# Patient Record
Sex: Male | Born: 1935 | Race: White | Hispanic: No | Smoking: Former smoker
Health system: Southern US, Community
[De-identification: ages and names within clinical notes are randomized; demographics above are authoritative.]

## PROBLEM LIST (undated history)

## (undated) DIAGNOSIS — I10 Essential (primary) hypertension: Secondary | ICD-10-CM

## (undated) DIAGNOSIS — M810 Age-related osteoporosis without current pathological fracture: Secondary | ICD-10-CM

## (undated) DIAGNOSIS — I2510 Atherosclerotic heart disease of native coronary artery without angina pectoris: Secondary | ICD-10-CM

## (undated) DIAGNOSIS — J449 Chronic obstructive pulmonary disease, unspecified: Secondary | ICD-10-CM

## (undated) DIAGNOSIS — M1990 Unspecified osteoarthritis, unspecified site: Secondary | ICD-10-CM

## (undated) DIAGNOSIS — C4492 Squamous cell carcinoma of skin, unspecified: Secondary | ICD-10-CM

## (undated) DIAGNOSIS — E785 Hyperlipidemia, unspecified: Secondary | ICD-10-CM

## (undated) DIAGNOSIS — N400 Enlarged prostate without lower urinary tract symptoms: Secondary | ICD-10-CM

## (undated) DIAGNOSIS — I639 Cerebral infarction, unspecified: Secondary | ICD-10-CM

## (undated) DIAGNOSIS — J189 Pneumonia, unspecified organism: Secondary | ICD-10-CM

## (undated) HISTORY — DX: Squamous cell carcinoma of skin, unspecified: C44.92

## (undated) HISTORY — DX: Cerebral infarction, unspecified: I63.9

## (undated) HISTORY — PX: BACK SURGERY: SHX140

## (undated) HISTORY — DX: Unspecified osteoarthritis, unspecified site: M19.90

## (undated) HISTORY — DX: Pneumonia, unspecified organism: J18.9

---

## 2011-03-01 DIAGNOSIS — Z87898 Personal history of other specified conditions: Secondary | ICD-10-CM | POA: Diagnosis not present

## 2011-04-01 DIAGNOSIS — Z87898 Personal history of other specified conditions: Secondary | ICD-10-CM | POA: Diagnosis not present

## 2011-04-04 DIAGNOSIS — E236 Other disorders of pituitary gland: Secondary | ICD-10-CM | POA: Diagnosis not present

## 2011-04-04 DIAGNOSIS — I10 Essential (primary) hypertension: Secondary | ICD-10-CM | POA: Diagnosis not present

## 2011-04-04 DIAGNOSIS — E291 Testicular hypofunction: Secondary | ICD-10-CM | POA: Diagnosis not present

## 2011-04-04 DIAGNOSIS — R0989 Other specified symptoms and signs involving the circulatory and respiratory systems: Secondary | ICD-10-CM | POA: Diagnosis not present

## 2011-04-29 DIAGNOSIS — Z87898 Personal history of other specified conditions: Secondary | ICD-10-CM | POA: Diagnosis not present

## 2011-05-05 DIAGNOSIS — I10 Essential (primary) hypertension: Secondary | ICD-10-CM | POA: Diagnosis not present

## 2011-05-09 DIAGNOSIS — M546 Pain in thoracic spine: Secondary | ICD-10-CM | POA: Diagnosis not present

## 2011-05-09 DIAGNOSIS — M545 Low back pain: Secondary | ICD-10-CM | POA: Diagnosis not present

## 2011-05-09 DIAGNOSIS — M4120 Other idiopathic scoliosis, site unspecified: Secondary | ICD-10-CM | POA: Diagnosis not present

## 2011-05-11 DIAGNOSIS — E860 Dehydration: Secondary | ICD-10-CM | POA: Diagnosis not present

## 2011-05-11 DIAGNOSIS — J9801 Acute bronchospasm: Secondary | ICD-10-CM | POA: Diagnosis not present

## 2011-05-11 DIAGNOSIS — Z9981 Dependence on supplemental oxygen: Secondary | ICD-10-CM | POA: Diagnosis not present

## 2011-05-11 DIAGNOSIS — R05 Cough: Secondary | ICD-10-CM | POA: Diagnosis not present

## 2011-05-11 DIAGNOSIS — R059 Cough, unspecified: Secondary | ICD-10-CM | POA: Diagnosis not present

## 2011-05-11 DIAGNOSIS — R079 Chest pain, unspecified: Secondary | ICD-10-CM | POA: Diagnosis not present

## 2011-05-11 DIAGNOSIS — Z7982 Long term (current) use of aspirin: Secondary | ICD-10-CM | POA: Diagnosis not present

## 2011-05-11 DIAGNOSIS — I10 Essential (primary) hypertension: Secondary | ICD-10-CM | POA: Diagnosis not present

## 2011-05-11 DIAGNOSIS — I2789 Other specified pulmonary heart diseases: Secondary | ICD-10-CM | POA: Diagnosis present

## 2011-05-11 DIAGNOSIS — E538 Deficiency of other specified B group vitamins: Secondary | ICD-10-CM | POA: Diagnosis not present

## 2011-05-11 DIAGNOSIS — Z87311 Personal history of (healed) other pathological fracture: Secondary | ICD-10-CM | POA: Diagnosis not present

## 2011-05-11 DIAGNOSIS — Q211 Atrial septal defect: Secondary | ICD-10-CM | POA: Diagnosis not present

## 2011-05-11 DIAGNOSIS — I519 Heart disease, unspecified: Secondary | ICD-10-CM | POA: Diagnosis present

## 2011-05-11 DIAGNOSIS — N320 Bladder-neck obstruction: Secondary | ICD-10-CM | POA: Diagnosis not present

## 2011-05-11 DIAGNOSIS — E46 Unspecified protein-calorie malnutrition: Secondary | ICD-10-CM | POA: Diagnosis not present

## 2011-05-11 DIAGNOSIS — E291 Testicular hypofunction: Secondary | ICD-10-CM | POA: Diagnosis present

## 2011-05-11 DIAGNOSIS — Z87442 Personal history of urinary calculi: Secondary | ICD-10-CM | POA: Diagnosis not present

## 2011-05-11 DIAGNOSIS — E871 Hypo-osmolality and hyponatremia: Secondary | ICD-10-CM | POA: Diagnosis not present

## 2011-05-11 DIAGNOSIS — M810 Age-related osteoporosis without current pathological fracture: Secondary | ICD-10-CM | POA: Diagnosis present

## 2011-05-11 DIAGNOSIS — Z87891 Personal history of nicotine dependence: Secondary | ICD-10-CM | POA: Diagnosis not present

## 2011-05-11 DIAGNOSIS — M4000 Postural kyphosis, site unspecified: Secondary | ICD-10-CM | POA: Diagnosis present

## 2011-05-11 DIAGNOSIS — R000 Tachycardia, unspecified: Secondary | ICD-10-CM | POA: Diagnosis present

## 2011-05-11 DIAGNOSIS — Z681 Body mass index (BMI) 19 or less, adult: Secondary | ICD-10-CM | POA: Diagnosis not present

## 2011-05-11 DIAGNOSIS — J449 Chronic obstructive pulmonary disease, unspecified: Secondary | ICD-10-CM | POA: Diagnosis not present

## 2011-05-11 DIAGNOSIS — E559 Vitamin D deficiency, unspecified: Secondary | ICD-10-CM | POA: Diagnosis not present

## 2011-05-11 DIAGNOSIS — J441 Chronic obstructive pulmonary disease with (acute) exacerbation: Secondary | ICD-10-CM | POA: Diagnosis not present

## 2011-05-11 DIAGNOSIS — J189 Pneumonia, unspecified organism: Secondary | ICD-10-CM | POA: Diagnosis not present

## 2011-05-11 DIAGNOSIS — Z8673 Personal history of transient ischemic attack (TIA), and cerebral infarction without residual deficits: Secondary | ICD-10-CM | POA: Diagnosis not present

## 2011-05-23 DIAGNOSIS — N320 Bladder-neck obstruction: Secondary | ICD-10-CM | POA: Diagnosis not present

## 2011-05-23 DIAGNOSIS — M810 Age-related osteoporosis without current pathological fracture: Secondary | ICD-10-CM | POA: Diagnosis not present

## 2011-05-23 DIAGNOSIS — R000 Tachycardia, unspecified: Secondary | ICD-10-CM | POA: Diagnosis not present

## 2011-05-23 DIAGNOSIS — J189 Pneumonia, unspecified organism: Secondary | ICD-10-CM | POA: Diagnosis not present

## 2011-05-23 DIAGNOSIS — J449 Chronic obstructive pulmonary disease, unspecified: Secondary | ICD-10-CM | POA: Diagnosis not present

## 2011-05-24 DIAGNOSIS — R310 Gross hematuria: Secondary | ICD-10-CM | POA: Diagnosis not present

## 2011-05-24 DIAGNOSIS — N401 Enlarged prostate with lower urinary tract symptoms: Secondary | ICD-10-CM | POA: Diagnosis not present

## 2011-06-01 DIAGNOSIS — N200 Calculus of kidney: Secondary | ICD-10-CM | POA: Diagnosis not present

## 2011-06-01 DIAGNOSIS — R310 Gross hematuria: Secondary | ICD-10-CM | POA: Diagnosis not present

## 2011-06-01 DIAGNOSIS — N401 Enlarged prostate with lower urinary tract symptoms: Secondary | ICD-10-CM | POA: Diagnosis not present

## 2011-06-09 DIAGNOSIS — R000 Tachycardia, unspecified: Secondary | ICD-10-CM | POA: Diagnosis not present

## 2011-06-09 DIAGNOSIS — N320 Bladder-neck obstruction: Secondary | ICD-10-CM | POA: Diagnosis not present

## 2011-06-09 DIAGNOSIS — I9589 Other hypotension: Secondary | ICD-10-CM | POA: Diagnosis not present

## 2011-06-15 DIAGNOSIS — N401 Enlarged prostate with lower urinary tract symptoms: Secondary | ICD-10-CM | POA: Diagnosis not present

## 2011-07-27 DIAGNOSIS — N401 Enlarged prostate with lower urinary tract symptoms: Secondary | ICD-10-CM | POA: Diagnosis not present

## 2011-08-03 DIAGNOSIS — I6789 Other cerebrovascular disease: Secondary | ICD-10-CM | POA: Diagnosis not present

## 2011-08-03 DIAGNOSIS — M6289 Other specified disorders of muscle: Secondary | ICD-10-CM | POA: Diagnosis not present

## 2011-08-03 DIAGNOSIS — M4000 Postural kyphosis, site unspecified: Secondary | ICD-10-CM | POA: Diagnosis not present

## 2011-08-03 DIAGNOSIS — N401 Enlarged prostate with lower urinary tract symptoms: Secondary | ICD-10-CM | POA: Diagnosis not present

## 2011-08-03 DIAGNOSIS — N320 Bladder-neck obstruction: Secondary | ICD-10-CM | POA: Diagnosis not present

## 2011-08-03 DIAGNOSIS — I10 Essential (primary) hypertension: Secondary | ICD-10-CM | POA: Diagnosis not present

## 2011-08-03 DIAGNOSIS — E291 Testicular hypofunction: Secondary | ICD-10-CM | POA: Diagnosis not present

## 2011-11-02 DIAGNOSIS — Z125 Encounter for screening for malignant neoplasm of prostate: Secondary | ICD-10-CM | POA: Diagnosis not present

## 2011-11-02 DIAGNOSIS — I10 Essential (primary) hypertension: Secondary | ICD-10-CM | POA: Diagnosis not present

## 2011-11-02 DIAGNOSIS — E291 Testicular hypofunction: Secondary | ICD-10-CM | POA: Diagnosis not present

## 2011-11-30 DIAGNOSIS — E291 Testicular hypofunction: Secondary | ICD-10-CM | POA: Diagnosis not present

## 2011-11-30 DIAGNOSIS — I10 Essential (primary) hypertension: Secondary | ICD-10-CM | POA: Diagnosis not present

## 2011-11-30 DIAGNOSIS — M810 Age-related osteoporosis without current pathological fracture: Secondary | ICD-10-CM | POA: Diagnosis not present

## 2011-11-30 DIAGNOSIS — N401 Enlarged prostate with lower urinary tract symptoms: Secondary | ICD-10-CM | POA: Diagnosis not present

## 2012-01-07 LAB — HM COLONOSCOPY: HM COLON: NORMAL

## 2012-02-06 DIAGNOSIS — N400 Enlarged prostate without lower urinary tract symptoms: Secondary | ICD-10-CM | POA: Diagnosis not present

## 2012-02-06 DIAGNOSIS — N320 Bladder-neck obstruction: Secondary | ICD-10-CM | POA: Diagnosis not present

## 2012-02-06 DIAGNOSIS — Z09 Encounter for follow-up examination after completed treatment for conditions other than malignant neoplasm: Secondary | ICD-10-CM | POA: Diagnosis not present

## 2012-03-23 DIAGNOSIS — R059 Cough, unspecified: Secondary | ICD-10-CM | POA: Diagnosis not present

## 2012-03-23 DIAGNOSIS — I10 Essential (primary) hypertension: Secondary | ICD-10-CM | POA: Diagnosis not present

## 2012-03-23 DIAGNOSIS — R05 Cough: Secondary | ICD-10-CM | POA: Diagnosis not present

## 2012-04-13 DIAGNOSIS — M6289 Other specified disorders of muscle: Secondary | ICD-10-CM | POA: Diagnosis not present

## 2012-04-13 DIAGNOSIS — M810 Age-related osteoporosis without current pathological fracture: Secondary | ICD-10-CM | POA: Diagnosis not present

## 2012-04-13 DIAGNOSIS — E291 Testicular hypofunction: Secondary | ICD-10-CM | POA: Diagnosis not present

## 2012-05-23 DIAGNOSIS — M4000 Postural kyphosis, site unspecified: Secondary | ICD-10-CM | POA: Diagnosis not present

## 2012-05-23 DIAGNOSIS — J449 Chronic obstructive pulmonary disease, unspecified: Secondary | ICD-10-CM | POA: Diagnosis not present

## 2012-05-23 DIAGNOSIS — I10 Essential (primary) hypertension: Secondary | ICD-10-CM | POA: Diagnosis not present

## 2012-05-23 DIAGNOSIS — M810 Age-related osteoporosis without current pathological fracture: Secondary | ICD-10-CM | POA: Diagnosis not present

## 2012-06-04 DIAGNOSIS — R079 Chest pain, unspecified: Secondary | ICD-10-CM | POA: Diagnosis not present

## 2012-06-04 DIAGNOSIS — Z7982 Long term (current) use of aspirin: Secondary | ICD-10-CM | POA: Diagnosis not present

## 2012-06-04 DIAGNOSIS — M549 Dorsalgia, unspecified: Secondary | ICD-10-CM | POA: Diagnosis not present

## 2012-06-04 DIAGNOSIS — D72829 Elevated white blood cell count, unspecified: Secondary | ICD-10-CM | POA: Diagnosis not present

## 2012-06-04 DIAGNOSIS — M545 Low back pain: Secondary | ICD-10-CM | POA: Diagnosis not present

## 2012-06-04 DIAGNOSIS — I10 Essential (primary) hypertension: Secondary | ICD-10-CM | POA: Diagnosis not present

## 2012-06-04 DIAGNOSIS — F17200 Nicotine dependence, unspecified, uncomplicated: Secondary | ICD-10-CM | POA: Diagnosis not present

## 2012-06-12 DIAGNOSIS — I779 Disorder of arteries and arterioles, unspecified: Secondary | ICD-10-CM | POA: Diagnosis not present

## 2012-06-12 DIAGNOSIS — F4321 Adjustment disorder with depressed mood: Secondary | ICD-10-CM | POA: Diagnosis not present

## 2012-06-12 DIAGNOSIS — I209 Angina pectoris, unspecified: Secondary | ICD-10-CM | POA: Diagnosis not present

## 2012-06-12 DIAGNOSIS — I10 Essential (primary) hypertension: Secondary | ICD-10-CM | POA: Diagnosis not present

## 2012-06-14 DIAGNOSIS — R220 Localized swelling, mass and lump, head: Secondary | ICD-10-CM | POA: Diagnosis not present

## 2012-06-14 DIAGNOSIS — R0989 Other specified symptoms and signs involving the circulatory and respiratory systems: Secondary | ICD-10-CM | POA: Diagnosis not present

## 2012-06-14 DIAGNOSIS — I6529 Occlusion and stenosis of unspecified carotid artery: Secondary | ICD-10-CM | POA: Diagnosis not present

## 2012-06-14 DIAGNOSIS — R221 Localized swelling, mass and lump, neck: Secondary | ICD-10-CM | POA: Diagnosis not present

## 2012-06-26 DIAGNOSIS — E291 Testicular hypofunction: Secondary | ICD-10-CM | POA: Diagnosis not present

## 2012-06-26 DIAGNOSIS — R0989 Other specified symptoms and signs involving the circulatory and respiratory systems: Secondary | ICD-10-CM | POA: Diagnosis not present

## 2012-06-26 DIAGNOSIS — I10 Essential (primary) hypertension: Secondary | ICD-10-CM | POA: Diagnosis not present

## 2012-07-17 DIAGNOSIS — I720 Aneurysm of carotid artery: Secondary | ICD-10-CM | POA: Diagnosis not present

## 2012-07-20 DIAGNOSIS — Z7982 Long term (current) use of aspirin: Secondary | ICD-10-CM | POA: Diagnosis not present

## 2012-07-20 DIAGNOSIS — I720 Aneurysm of carotid artery: Secondary | ICD-10-CM | POA: Diagnosis not present

## 2012-07-20 DIAGNOSIS — I6529 Occlusion and stenosis of unspecified carotid artery: Secondary | ICD-10-CM | POA: Diagnosis not present

## 2012-07-26 DIAGNOSIS — I720 Aneurysm of carotid artery: Secondary | ICD-10-CM | POA: Diagnosis not present

## 2012-08-27 DIAGNOSIS — F4321 Adjustment disorder with depressed mood: Secondary | ICD-10-CM | POA: Diagnosis not present

## 2012-08-27 DIAGNOSIS — R0989 Other specified symptoms and signs involving the circulatory and respiratory systems: Secondary | ICD-10-CM | POA: Diagnosis not present

## 2012-08-27 DIAGNOSIS — I10 Essential (primary) hypertension: Secondary | ICD-10-CM | POA: Diagnosis not present

## 2012-08-27 DIAGNOSIS — M4000 Postural kyphosis, site unspecified: Secondary | ICD-10-CM | POA: Diagnosis not present

## 2012-08-27 DIAGNOSIS — E291 Testicular hypofunction: Secondary | ICD-10-CM | POA: Diagnosis not present

## 2012-08-27 DIAGNOSIS — Z006 Encounter for examination for normal comparison and control in clinical research program: Secondary | ICD-10-CM | POA: Diagnosis not present

## 2012-08-29 DIAGNOSIS — S7000XA Contusion of unspecified hip, initial encounter: Secondary | ICD-10-CM | POA: Diagnosis not present

## 2012-09-25 DIAGNOSIS — I10 Essential (primary) hypertension: Secondary | ICD-10-CM | POA: Diagnosis not present

## 2012-09-25 DIAGNOSIS — J449 Chronic obstructive pulmonary disease, unspecified: Secondary | ICD-10-CM | POA: Diagnosis not present

## 2012-09-25 DIAGNOSIS — IMO0002 Reserved for concepts with insufficient information to code with codable children: Secondary | ICD-10-CM | POA: Diagnosis not present

## 2012-09-25 DIAGNOSIS — Z1211 Encounter for screening for malignant neoplasm of colon: Secondary | ICD-10-CM | POA: Diagnosis not present

## 2012-12-03 DIAGNOSIS — I10 Essential (primary) hypertension: Secondary | ICD-10-CM | POA: Diagnosis not present

## 2012-12-03 DIAGNOSIS — Z23 Encounter for immunization: Secondary | ICD-10-CM | POA: Diagnosis not present

## 2012-12-03 DIAGNOSIS — E291 Testicular hypofunction: Secondary | ICD-10-CM | POA: Diagnosis not present

## 2012-12-03 DIAGNOSIS — IMO0002 Reserved for concepts with insufficient information to code with codable children: Secondary | ICD-10-CM | POA: Diagnosis not present

## 2012-12-03 DIAGNOSIS — I779 Disorder of arteries and arterioles, unspecified: Secondary | ICD-10-CM | POA: Diagnosis not present

## 2013-01-07 DIAGNOSIS — I6529 Occlusion and stenosis of unspecified carotid artery: Secondary | ICD-10-CM | POA: Diagnosis not present

## 2013-01-07 DIAGNOSIS — I720 Aneurysm of carotid artery: Secondary | ICD-10-CM | POA: Diagnosis not present

## 2013-01-31 DIAGNOSIS — I6529 Occlusion and stenosis of unspecified carotid artery: Secondary | ICD-10-CM | POA: Diagnosis not present

## 2013-02-07 DIAGNOSIS — N320 Bladder-neck obstruction: Secondary | ICD-10-CM | POA: Diagnosis not present

## 2013-02-07 DIAGNOSIS — N400 Enlarged prostate without lower urinary tract symptoms: Secondary | ICD-10-CM | POA: Diagnosis not present

## 2013-02-07 DIAGNOSIS — N529 Male erectile dysfunction, unspecified: Secondary | ICD-10-CM | POA: Diagnosis not present

## 2013-02-18 DIAGNOSIS — E291 Testicular hypofunction: Secondary | ICD-10-CM | POA: Diagnosis present

## 2013-02-18 DIAGNOSIS — E46 Unspecified protein-calorie malnutrition: Secondary | ICD-10-CM | POA: Diagnosis not present

## 2013-02-18 DIAGNOSIS — J189 Pneumonia, unspecified organism: Secondary | ICD-10-CM | POA: Diagnosis not present

## 2013-02-18 DIAGNOSIS — Z8701 Personal history of pneumonia (recurrent): Secondary | ICD-10-CM | POA: Diagnosis not present

## 2013-02-18 DIAGNOSIS — J449 Chronic obstructive pulmonary disease, unspecified: Secondary | ICD-10-CM | POA: Diagnosis present

## 2013-02-18 DIAGNOSIS — Z8673 Personal history of transient ischemic attack (TIA), and cerebral infarction without residual deficits: Secondary | ICD-10-CM | POA: Diagnosis not present

## 2013-02-18 DIAGNOSIS — I959 Hypotension, unspecified: Secondary | ICD-10-CM | POA: Diagnosis not present

## 2013-02-18 DIAGNOSIS — E869 Volume depletion, unspecified: Secondary | ICD-10-CM | POA: Diagnosis not present

## 2013-02-18 DIAGNOSIS — R079 Chest pain, unspecified: Secondary | ICD-10-CM | POA: Diagnosis not present

## 2013-02-18 DIAGNOSIS — I510 Cardiac septal defect, acquired: Secondary | ICD-10-CM | POA: Diagnosis not present

## 2013-02-18 DIAGNOSIS — Z87311 Personal history of (healed) other pathological fracture: Secondary | ICD-10-CM | POA: Diagnosis not present

## 2013-02-18 DIAGNOSIS — D72829 Elevated white blood cell count, unspecified: Secondary | ICD-10-CM | POA: Diagnosis not present

## 2013-02-18 DIAGNOSIS — R6889 Other general symptoms and signs: Secondary | ICD-10-CM | POA: Diagnosis not present

## 2013-02-18 DIAGNOSIS — J154 Pneumonia due to other streptococci: Secondary | ICD-10-CM | POA: Diagnosis not present

## 2013-02-18 DIAGNOSIS — Z87891 Personal history of nicotine dependence: Secondary | ICD-10-CM | POA: Diagnosis not present

## 2013-02-18 DIAGNOSIS — M810 Age-related osteoporosis without current pathological fracture: Secondary | ICD-10-CM | POA: Diagnosis not present

## 2013-02-18 DIAGNOSIS — Z7982 Long term (current) use of aspirin: Secondary | ICD-10-CM | POA: Diagnosis not present

## 2013-02-18 DIAGNOSIS — E860 Dehydration: Secondary | ICD-10-CM | POA: Diagnosis not present

## 2013-02-27 DIAGNOSIS — J8410 Pulmonary fibrosis, unspecified: Secondary | ICD-10-CM | POA: Diagnosis not present

## 2013-02-27 DIAGNOSIS — R079 Chest pain, unspecified: Secondary | ICD-10-CM | POA: Diagnosis not present

## 2013-02-27 DIAGNOSIS — N179 Acute kidney failure, unspecified: Secondary | ICD-10-CM | POA: Diagnosis not present

## 2013-02-27 DIAGNOSIS — J449 Chronic obstructive pulmonary disease, unspecified: Secondary | ICD-10-CM | POA: Diagnosis not present

## 2013-02-27 DIAGNOSIS — Z87311 Personal history of (healed) other pathological fracture: Secondary | ICD-10-CM | POA: Diagnosis not present

## 2013-02-27 DIAGNOSIS — R799 Abnormal finding of blood chemistry, unspecified: Secondary | ICD-10-CM | POA: Diagnosis not present

## 2013-02-27 DIAGNOSIS — Z7982 Long term (current) use of aspirin: Secondary | ICD-10-CM | POA: Diagnosis not present

## 2013-02-27 DIAGNOSIS — Z8701 Personal history of pneumonia (recurrent): Secondary | ICD-10-CM | POA: Diagnosis not present

## 2013-02-27 DIAGNOSIS — F17200 Nicotine dependence, unspecified, uncomplicated: Secondary | ICD-10-CM | POA: Diagnosis present

## 2013-02-27 DIAGNOSIS — R6520 Severe sepsis without septic shock: Secondary | ICD-10-CM | POA: Diagnosis not present

## 2013-02-27 DIAGNOSIS — I5030 Unspecified diastolic (congestive) heart failure: Secondary | ICD-10-CM | POA: Diagnosis not present

## 2013-02-27 DIAGNOSIS — I10 Essential (primary) hypertension: Secondary | ICD-10-CM | POA: Diagnosis present

## 2013-02-27 DIAGNOSIS — E876 Hypokalemia: Secondary | ICD-10-CM | POA: Diagnosis not present

## 2013-02-27 DIAGNOSIS — A419 Sepsis, unspecified organism: Secondary | ICD-10-CM | POA: Diagnosis not present

## 2013-02-27 DIAGNOSIS — J441 Chronic obstructive pulmonary disease with (acute) exacerbation: Secondary | ICD-10-CM | POA: Diagnosis not present

## 2013-02-27 DIAGNOSIS — I200 Unstable angina: Secondary | ICD-10-CM | POA: Diagnosis not present

## 2013-02-27 DIAGNOSIS — R748 Abnormal levels of other serum enzymes: Secondary | ICD-10-CM | POA: Diagnosis present

## 2013-02-27 DIAGNOSIS — I2510 Atherosclerotic heart disease of native coronary artery without angina pectoris: Secondary | ICD-10-CM | POA: Diagnosis present

## 2013-02-27 DIAGNOSIS — Z8673 Personal history of transient ischemic attack (TIA), and cerebral infarction without residual deficits: Secondary | ICD-10-CM | POA: Diagnosis not present

## 2013-02-27 DIAGNOSIS — I6529 Occlusion and stenosis of unspecified carotid artery: Secondary | ICD-10-CM | POA: Diagnosis present

## 2013-02-27 DIAGNOSIS — M810 Age-related osteoporosis without current pathological fracture: Secondary | ICD-10-CM | POA: Diagnosis present

## 2013-02-27 DIAGNOSIS — I2789 Other specified pulmonary heart diseases: Secondary | ICD-10-CM | POA: Diagnosis present

## 2013-02-27 DIAGNOSIS — B9789 Other viral agents as the cause of diseases classified elsewhere: Secondary | ICD-10-CM | POA: Diagnosis not present

## 2013-02-27 DIAGNOSIS — J111 Influenza due to unidentified influenza virus with other respiratory manifestations: Secondary | ICD-10-CM | POA: Diagnosis present

## 2013-02-27 DIAGNOSIS — R509 Fever, unspecified: Secondary | ICD-10-CM | POA: Diagnosis not present

## 2013-03-04 DIAGNOSIS — M810 Age-related osteoporosis without current pathological fracture: Secondary | ICD-10-CM | POA: Diagnosis not present

## 2013-03-04 DIAGNOSIS — I10 Essential (primary) hypertension: Secondary | ICD-10-CM | POA: Diagnosis not present

## 2013-03-04 DIAGNOSIS — J1100 Influenza due to unidentified influenza virus with unspecified type of pneumonia: Secondary | ICD-10-CM | POA: Diagnosis not present

## 2013-03-04 DIAGNOSIS — J189 Pneumonia, unspecified organism: Secondary | ICD-10-CM | POA: Diagnosis not present

## 2013-03-04 DIAGNOSIS — E291 Testicular hypofunction: Secondary | ICD-10-CM | POA: Diagnosis not present

## 2013-03-04 DIAGNOSIS — M4000 Postural kyphosis, site unspecified: Secondary | ICD-10-CM | POA: Diagnosis not present

## 2013-03-04 DIAGNOSIS — J449 Chronic obstructive pulmonary disease, unspecified: Secondary | ICD-10-CM | POA: Diagnosis not present

## 2013-05-01 DIAGNOSIS — N400 Enlarged prostate without lower urinary tract symptoms: Secondary | ICD-10-CM | POA: Diagnosis not present

## 2013-05-01 DIAGNOSIS — N320 Bladder-neck obstruction: Secondary | ICD-10-CM | POA: Diagnosis not present

## 2013-05-01 DIAGNOSIS — N529 Male erectile dysfunction, unspecified: Secondary | ICD-10-CM | POA: Diagnosis not present

## 2013-05-02 DIAGNOSIS — M810 Age-related osteoporosis without current pathological fracture: Secondary | ICD-10-CM | POA: Diagnosis not present

## 2013-05-02 DIAGNOSIS — E291 Testicular hypofunction: Secondary | ICD-10-CM | POA: Diagnosis not present

## 2013-05-02 DIAGNOSIS — I6789 Other cerebrovascular disease: Secondary | ICD-10-CM | POA: Diagnosis not present

## 2013-05-02 DIAGNOSIS — I10 Essential (primary) hypertension: Secondary | ICD-10-CM | POA: Diagnosis not present

## 2013-05-29 DIAGNOSIS — H5710 Ocular pain, unspecified eye: Secondary | ICD-10-CM | POA: Diagnosis not present

## 2013-05-29 DIAGNOSIS — H10509 Unspecified blepharoconjunctivitis, unspecified eye: Secondary | ICD-10-CM | POA: Diagnosis not present

## 2013-05-29 DIAGNOSIS — H01009 Unspecified blepharitis unspecified eye, unspecified eyelid: Secondary | ICD-10-CM | POA: Diagnosis not present

## 2013-06-07 DIAGNOSIS — Z86010 Personal history of colonic polyps: Secondary | ICD-10-CM | POA: Diagnosis not present

## 2013-06-15 DIAGNOSIS — F17200 Nicotine dependence, unspecified, uncomplicated: Secondary | ICD-10-CM | POA: Diagnosis not present

## 2013-06-15 DIAGNOSIS — R51 Headache: Secondary | ICD-10-CM | POA: Diagnosis not present

## 2013-06-15 DIAGNOSIS — H5710 Ocular pain, unspecified eye: Secondary | ICD-10-CM | POA: Diagnosis not present

## 2013-06-15 DIAGNOSIS — G501 Atypical facial pain: Secondary | ICD-10-CM | POA: Diagnosis not present

## 2013-06-15 DIAGNOSIS — J449 Chronic obstructive pulmonary disease, unspecified: Secondary | ICD-10-CM | POA: Diagnosis not present

## 2013-06-15 DIAGNOSIS — I10 Essential (primary) hypertension: Secondary | ICD-10-CM | POA: Diagnosis not present

## 2013-06-18 DIAGNOSIS — H01009 Unspecified blepharitis unspecified eye, unspecified eyelid: Secondary | ICD-10-CM | POA: Diagnosis not present

## 2013-06-18 DIAGNOSIS — H5710 Ocular pain, unspecified eye: Secondary | ICD-10-CM | POA: Diagnosis not present

## 2013-06-18 DIAGNOSIS — G500 Trigeminal neuralgia: Secondary | ICD-10-CM | POA: Diagnosis not present

## 2013-06-25 DIAGNOSIS — I10 Essential (primary) hypertension: Secondary | ICD-10-CM | POA: Diagnosis not present

## 2013-06-25 DIAGNOSIS — Z79899 Other long term (current) drug therapy: Secondary | ICD-10-CM | POA: Diagnosis not present

## 2013-06-25 DIAGNOSIS — R1031 Right lower quadrant pain: Secondary | ICD-10-CM | POA: Diagnosis not present

## 2013-06-25 DIAGNOSIS — K5900 Constipation, unspecified: Secondary | ICD-10-CM | POA: Diagnosis not present

## 2013-06-25 DIAGNOSIS — Z9981 Dependence on supplemental oxygen: Secondary | ICD-10-CM | POA: Diagnosis not present

## 2013-06-25 DIAGNOSIS — R109 Unspecified abdominal pain: Secondary | ICD-10-CM | POA: Diagnosis not present

## 2013-06-25 DIAGNOSIS — J449 Chronic obstructive pulmonary disease, unspecified: Secondary | ICD-10-CM | POA: Diagnosis not present

## 2013-06-25 DIAGNOSIS — R1032 Left lower quadrant pain: Secondary | ICD-10-CM | POA: Diagnosis not present

## 2013-07-02 DIAGNOSIS — G500 Trigeminal neuralgia: Secondary | ICD-10-CM | POA: Diagnosis not present

## 2013-07-02 DIAGNOSIS — I6990 Unspecified sequelae of unspecified cerebrovascular disease: Secondary | ICD-10-CM | POA: Diagnosis not present

## 2013-07-18 DIAGNOSIS — I498 Other specified cardiac arrhythmias: Secondary | ICD-10-CM | POA: Diagnosis not present

## 2013-07-18 DIAGNOSIS — R351 Nocturia: Secondary | ICD-10-CM | POA: Diagnosis not present

## 2013-07-18 DIAGNOSIS — K5900 Constipation, unspecified: Secondary | ICD-10-CM | POA: Diagnosis not present

## 2013-07-18 DIAGNOSIS — G500 Trigeminal neuralgia: Secondary | ICD-10-CM | POA: Diagnosis not present

## 2013-07-18 DIAGNOSIS — I10 Essential (primary) hypertension: Secondary | ICD-10-CM | POA: Diagnosis not present

## 2013-08-22 DIAGNOSIS — J449 Chronic obstructive pulmonary disease, unspecified: Secondary | ICD-10-CM | POA: Diagnosis not present

## 2013-08-22 DIAGNOSIS — E291 Testicular hypofunction: Secondary | ICD-10-CM | POA: Diagnosis not present

## 2013-08-22 DIAGNOSIS — IMO0002 Reserved for concepts with insufficient information to code with codable children: Secondary | ICD-10-CM | POA: Diagnosis not present

## 2013-08-22 DIAGNOSIS — M810 Age-related osteoporosis without current pathological fracture: Secondary | ICD-10-CM | POA: Diagnosis not present

## 2013-08-22 DIAGNOSIS — N401 Enlarged prostate with lower urinary tract symptoms: Secondary | ICD-10-CM | POA: Diagnosis not present

## 2013-08-22 DIAGNOSIS — I10 Essential (primary) hypertension: Secondary | ICD-10-CM | POA: Diagnosis not present

## 2013-08-22 DIAGNOSIS — I779 Disorder of arteries and arterioles, unspecified: Secondary | ICD-10-CM | POA: Diagnosis not present

## 2013-08-22 LAB — CBC AND DIFFERENTIAL
Neutrophils Absolute: 7 /uL
WBC: 10.2 10^3/mL

## 2013-08-22 LAB — BASIC METABOLIC PANEL
BUN: 12 mg/dL (ref 4–21)
CREATININE: 0.9 mg/dL (ref <=1.3)

## 2013-08-22 LAB — TSH: TSH: 1.27 u[IU]/mL (ref <=5.90)

## 2013-08-22 LAB — HEPATIC FUNCTION PANEL: BILIRUBIN, TOTAL: 0.7 mg/dL

## 2013-08-28 DIAGNOSIS — I6529 Occlusion and stenosis of unspecified carotid artery: Secondary | ICD-10-CM | POA: Diagnosis not present

## 2013-09-03 DIAGNOSIS — I6529 Occlusion and stenosis of unspecified carotid artery: Secondary | ICD-10-CM | POA: Diagnosis not present

## 2013-09-13 DIAGNOSIS — G500 Trigeminal neuralgia: Secondary | ICD-10-CM | POA: Diagnosis not present

## 2013-10-08 ENCOUNTER — Emergency Department (INDEPENDENT_AMBULATORY_CARE_PROVIDER_SITE_OTHER)
Admission: EM | Admit: 2013-10-08 | Discharge: 2013-10-08 | Disposition: A | Discharge status: Home / Self Care | Payer: Medicare Other | Source: Home / Self Care | Attending: Family Medicine | Admitting: Family Medicine

## 2013-10-08 ENCOUNTER — Encounter (HOSPITAL_COMMUNITY): Payer: Self-pay | Admitting: Emergency Medicine

## 2013-10-08 ENCOUNTER — Telehealth: Payer: Self-pay | Admitting: Family Medicine

## 2013-10-08 ENCOUNTER — Emergency Department (INDEPENDENT_AMBULATORY_CARE_PROVIDER_SITE_OTHER): Payer: Medicare Other

## 2013-10-08 DIAGNOSIS — R079 Chest pain, unspecified: Secondary | ICD-10-CM | POA: Diagnosis not present

## 2013-10-08 DIAGNOSIS — J189 Pneumonia, unspecified organism: Secondary | ICD-10-CM

## 2013-10-08 DIAGNOSIS — R0602 Shortness of breath: Secondary | ICD-10-CM | POA: Diagnosis not present

## 2013-10-08 HISTORY — DX: Chronic obstructive pulmonary disease, unspecified: J44.9

## 2013-10-08 HISTORY — DX: Hyperlipidemia, unspecified: E78.5

## 2013-10-08 HISTORY — DX: Age-related osteoporosis without current pathological fracture: M81.0

## 2013-10-08 HISTORY — DX: Benign prostatic hyperplasia without lower urinary tract symptoms: N40.0

## 2013-10-08 HISTORY — DX: Essential (primary) hypertension: I10

## 2013-10-08 HISTORY — DX: Atherosclerotic heart disease of native coronary artery without angina pectoris: I25.10

## 2013-10-08 MED ORDER — LEVOFLOXACIN 500 MG PO TABS: 500.0000 mg | ORAL_TABLET | Freq: Every day | ORAL | Status: DC

## 2013-10-08 MED ORDER — PREDNISONE 50 MG PO TABS: ORAL_TABLET | ORAL | Status: DC

## 2013-10-08 NOTE — ED Provider Notes (Signed)
CSN: [id number]     Arrival date & time 10/08/13  1014 History   First MD Initiated Contact with Patient 10/08/13 1046     Chief Complaint  Patient presents with  . Pneumonia   (Consider location/radiation/quality/duration/timing/severity/associated sxs/prior Treatment) HPI  Runny nose and cough started yesterday afternoon. Now w/ general body aches, and productive cough. Subjective fevers adn chills. PNA x 2 earlier this year. Denies rash, diarrhea, nausea, CP.    Past Medical History  Diagnosis Date  . Osteoporosis   . COPD (chronic obstructive pulmonary disease)   . Coronary artery disease   . Hypertension   . BPH (benign prostatic hyperplasia)   . HLD (hyperlipidemia)    Past Surgical History  Procedure Laterality Date  . Back surgery     No family history on file. History  Substance Use Topics  . Smoking status: Never Smoker   . Smokeless tobacco: Not on file  . Alcohol Use: No    Review of Systems Per HPI with all other pertinent systems negative.   Allergies  Review of patient's allergies indicates no known allergies.  Home Medications   Prior to Admission medications   Medication Sig Start Date End Date Taking? Authorizing Provider  aspirin EC 81 MG tablet Take 81 mg by mouth daily.   Yes Historical Provider, MD  HYDROcodone-acetaminophen (NORCO) 10-325 MG per tablet Take 1 tablet by mouth every 6 (six) hours as needed.   Yes Historical Provider, MD  testosterone (ANDROGEL) 50 MG/5GM (1%) GEL Place 5 g onto the skin daily.   Yes Historical Provider, MD  valsartan (DIOVAN) 80 MG tablet Take 80 mg by mouth daily.   Yes Historical Provider, MD  Vitamin D, Ergocalciferol, (DRISDOL) 50000 UNITS CAPS capsule Take 50,000 Units by mouth every 7 (seven) days.   Yes Historical Provider, MD  levofloxacin (LEVAQUIN) 500 MG tablet Take 1 tablet (500 mg total) by mouth daily. 10/08/13   Waldemar Dickens, MD  predniSONE (DELTASONE) 50 MG tablet Take daily with breakfast  10/08/13   Waldemar Dickens, MD   BP 134/94  Pulse 118  Temp(Src) 99.1 F (37.3 C) (Oral)  Resp 22  SpO2 94% Physical Exam  Constitutional: He is oriented to person, place, and time. He appears well-developed and well-nourished. No distress.  HENT:  Head: Normocephalic and atraumatic.  Eyes: EOM are normal. Pupils are equal, round, and reactive to light.  Neck: Normal range of motion.  Cardiovascular: Normal rate, normal heart sounds and intact distal pulses.   No murmur heard. Pulmonary/Chest: Effort normal. He has no wheezes.  Crackles bilat in bases w./ R>L.   Abdominal: Soft.  Musculoskeletal: Normal range of motion. He exhibits no tenderness.  Neurological: He is alert and oriented to person, place, and time.  Skin: Skin is warm. No rash noted. He is not diaphoretic.  Psychiatric: His behavior is normal. Judgment normal.    ED Course  Procedures (including critical care time) Labs Review Labs Reviewed - No data to display  Imaging Review Dg Chest 2 View  10/08/2013   CLINICAL DATA:  Shortness of breath and chest pain.  EXAM: CHEST  2 VIEW  COMPARISON:  None.  FINDINGS: Mild coarsening of the pulmonary interstitium is identified. No consolidative process, pneumothorax or effusion is seen. Heart size is normal. Multiple thoracic compression fracture deformities are identified. The patient is status post vertebral augmentation in the mid and lower thoracic spine and upper lumbar spine.  IMPRESSION: No acute disease.   Electronically  Signed   By: Inge Rise M.D.   On: 10/08/2013 11:16     MDM   1. Community acquired pneumonia    H/o recurring CAP/HCAP. Pt w/ significant risk factors for decompensation and admission CXR read w/o PNA but on my read, RLL consolidation wrorriesome for PNA Start Levaquin 500 Qday x 10 days (renal fxn nml per pt and family).  - prednisone. Only start if develops any wheezing or SOB - Precautions given and all questions answered  Linna Darner, MD Family Medicine 10/08/2013, 12:04 PM      Waldemar Dickens, MD 10/08/13 [phone]

## 2013-10-08 NOTE — Discharge Instructions (Signed)
I am concerned that you have a pneumonia.  This should be easily treated with antibiotics Please start the prednisone if you become wheezy and short of breath

## 2013-10-08 NOTE — ED Notes (Signed)
Patient started feeling bad yesterday.  Patient concerned he may have pneumonia.  Patient has a cough, minimal phlegm, torso soreness, c/o headache, and fever.

## 2013-10-08 NOTE — Telephone Encounter (Signed)
Patient Information:  Caller Name: [name]  Phone: [phone]  Patient: [name], [name]  Gender: Male  DOB: 07-27-1931  Age: 79 Years  PCP: [name].  Office Follow Up:  Does the office need to follow up with this patient?: N/A  Instructions For The Office: N/A   Symptoms  Reason For Call & Symptoms: This call was transferred to the nurse . Pt is a new pt and has not been seen at the office yet. His new pt appt is in November. His daugthter-in-law is calling to see if we could see him today even though he has not been seen in the office yet. Since RN didn't know that policy/called the back line and spoke with [facility]. He advised since he had not been seen yet and all appts are full today to send the pt to UC. RN advised [facility]. No triage done.  Reviewed Health History In EMR: N/A  Reviewed Medications In EMR: N/A  Reviewed Allergies In EMR: N/A  Reviewed Surgeries / Procedures: N/A  Date of Onset of Symptoms: 10/07/2013  Guideline(s) Used:  No Protocol Available - Information Only  Disposition Per Guideline:   Home Care  Reason For Disposition Reached:   Information only question and nurse able to answer  Advice Given:  N/A  RN Overrode Recommendation:  Go To U.C[facility]  Not seen yet at the office. Referred to [facility] per office staff.

## 2013-10-08 NOTE — Telephone Encounter (Signed)
Per chart, patient went to [facility] to be evaluated and treated.

## 2013-10-09 ENCOUNTER — Telehealth: Payer: Self-pay

## 2013-10-09 NOTE — Telephone Encounter (Addendum)
Called to update patient's chart.  Spoke with son who says to call him back around 2:30 pm today at [phone] [facility]) to complete call.

## 2013-10-09 NOTE — Telephone Encounter (Signed)
New patient New patient appointment scheduled 01/06/14 @ 3:00 pm.   Urgent Care Follow-up 10/15/13 @ 11:30 am.   Information below provided by patient's son, Mr. Deven, F[place].   Son encouraged to bring patient's previous records and documents from urgent care visit.  He agreed he would.   Medication List and allergies:  Updated and Reviewed  90 day supply/mail order: n/a Local prescriptions:  [place] [place] - [address]  No changes to personal, family or PSH  To discuss with provider:   Nothing at this time.

## 2013-10-15 ENCOUNTER — Encounter: Payer: Self-pay | Admitting: Family Medicine

## 2013-10-15 ENCOUNTER — Ambulatory Visit (INDEPENDENT_AMBULATORY_CARE_PROVIDER_SITE_OTHER): Payer: Medicare Other | Admitting: Family Medicine

## 2013-10-15 VITALS — BP 148/84 | HR 94 | Temp 97.7°F | Resp 14 | Wt 136.4 lb

## 2013-10-15 DIAGNOSIS — J189 Pneumonia, unspecified organism: Secondary | ICD-10-CM

## 2013-10-15 DIAGNOSIS — IMO0002 Reserved for concepts with insufficient information to code with codable children: Secondary | ICD-10-CM

## 2013-10-15 DIAGNOSIS — I10 Essential (primary) hypertension: Secondary | ICD-10-CM | POA: Diagnosis not present

## 2013-10-15 DIAGNOSIS — E291 Testicular hypofunction: Secondary | ICD-10-CM

## 2013-10-15 DIAGNOSIS — J449 Chronic obstructive pulmonary disease, unspecified: Secondary | ICD-10-CM | POA: Diagnosis not present

## 2013-10-15 DIAGNOSIS — Z125 Encounter for screening for malignant neoplasm of prostate: Secondary | ICD-10-CM

## 2013-10-15 DIAGNOSIS — S32000S Wedge compression fracture of unspecified lumbar vertebra, sequela: Secondary | ICD-10-CM

## 2013-10-15 DIAGNOSIS — R7989 Other specified abnormal findings of blood chemistry: Secondary | ICD-10-CM | POA: Insufficient documentation

## 2013-10-15 DIAGNOSIS — I6350 Cerebral infarction due to unspecified occlusion or stenosis of unspecified cerebral artery: Secondary | ICD-10-CM

## 2013-10-15 DIAGNOSIS — Z8673 Personal history of transient ischemic attack (TIA), and cerebral infarction without residual deficits: Secondary | ICD-10-CM | POA: Insufficient documentation

## 2013-10-15 DIAGNOSIS — I639 Cerebral infarction, unspecified: Secondary | ICD-10-CM

## 2013-10-15 DIAGNOSIS — M810 Age-related osteoporosis without current pathological fracture: Secondary | ICD-10-CM | POA: Diagnosis not present

## 2013-10-15 DIAGNOSIS — E785 Hyperlipidemia, unspecified: Secondary | ICD-10-CM | POA: Insufficient documentation

## 2013-10-15 DIAGNOSIS — I6529 Occlusion and stenosis of unspecified carotid artery: Secondary | ICD-10-CM | POA: Insufficient documentation

## 2013-10-15 DIAGNOSIS — Z8781 Personal history of (healed) traumatic fracture: Secondary | ICD-10-CM | POA: Insufficient documentation

## 2013-10-15 DIAGNOSIS — I6521 Occlusion and stenosis of right carotid artery: Secondary | ICD-10-CM

## 2013-10-15 LAB — BASIC METABOLIC PANEL
BUN: 17 mg/dL (ref 6–23)
CHLORIDE: 101 meq/L (ref 96–112)
CO2: 27 mEq/L (ref 19–32)
Calcium: 10.1 mg/dL (ref 8.4–10.5)
Creatinine, Ser: 0.8 mg/dL (ref 0.4–1.5)
GFR: 104.9 mL/min (ref >60.00)
Glucose, Bld: 72 mg/dL (ref 70–99)
Potassium: 4.6 mEq/L (ref 3.5–5.1)
Sodium: 139 mEq/L (ref 135–145)

## 2013-10-15 LAB — CBC WITH DIFFERENTIAL/PLATELET
Basophils Absolute: 0 10*3/uL (ref 0.0–0.1)
Basophils Relative: 0.3 % (ref 0.0–3.0)
EOS ABS: 0.3 10*3/uL (ref 0.0–0.7)
Eosinophils Relative: 2.1 % (ref 0.0–5.0)
HCT: 49.6 % (ref 39.0–52.0)
Hemoglobin: 16.3 g/dL (ref 13.0–17.0)
Lymphocytes Relative: 18.2 % (ref 12.0–46.0)
Lymphs Abs: 2.4 10*3/uL (ref 0.7–4.0)
MCHC: 32.8 g/dL (ref 30.0–36.0)
MCV: 91.3 fl (ref 78.0–100.0)
MONO ABS: 1.2 10*3/uL — AB (ref 0.1–1.0)
Monocytes Relative: 9.2 % (ref 3.0–12.0)
NEUTROS PCT: 70.2 % (ref 43.0–77.0)
Neutro Abs: 9.1 10*3/uL — ABNORMAL HIGH (ref 1.4–7.7)
PLATELETS: 310 10*3/uL (ref 150.0–400.0)
RBC: 5.43 Mil/uL (ref 4.22–5.81)
RDW: 14.3 % (ref 11.5–15.5)
WBC: 13 10*3/uL — AB (ref 4.0–10.5)

## 2013-10-15 LAB — PSA: PSA: 3.23 ng/mL (ref 0.10–4.00)

## 2013-10-15 LAB — LIPID PANEL
CHOL/HDL RATIO: 5
CHOLESTEROL: 203 mg/dL — AB (ref 0–200)
HDL: 43.5 mg/dL (ref >39.00)
LDL Cholesterol: 145 mg/dL — ABNORMAL HIGH (ref 0–99)
NonHDL: 159.5
Triglycerides: 74 mg/dL (ref 0.0–149.0)
VLDL: 14.8 mg/dL (ref 0.0–40.0)

## 2013-10-15 LAB — HEPATIC FUNCTION PANEL
ALT: 21 U/L (ref 0–53)
AST: 21 U/L (ref 0–37)
Albumin: 4.1 g/dL (ref 3.5–5.2)
Alkaline Phosphatase: 88 U/L (ref 39–117)
BILIRUBIN DIRECT: 0.1 mg/dL (ref 0.0–0.3)
BILIRUBIN TOTAL: 0.6 mg/dL (ref 0.2–1.2)
Total Protein: 7.2 g/dL (ref 6.0–8.3)

## 2013-10-15 LAB — VITAMIN D 25 HYDROXY (VIT D DEFICIENCY, FRACTURES): VITD: 32.12 ng/mL (ref 30.00–100.00)

## 2013-10-15 NOTE — Assessment & Plan Note (Signed)
New to provider, ongoing for pt.  Unclear as to when pt last had labs drawn- including PSA.  Due to testosterone supplementation, he is at increased risk of BPH and prostate cancer.  Will get PSA today as baseline and refer to urology if needed.

## 2013-10-15 NOTE — Progress Notes (Signed)
Pre visit review using our clinic review tool, if applicable. No additional management support is needed unless otherwise documented below in the visit note. 

## 2013-10-15 NOTE — Assessment & Plan Note (Addendum)
New to provider, ongoing for pt.  Reports he is due for repeat US in 1 yr.  Will get records sent from previous provider to review/compare.  Suspect he will need to be on a statin to reduce risk of additional plaque formation.  Will get lipids to confirm.

## 2013-10-15 NOTE — Assessment & Plan Note (Signed)
New to provider, ongoing for pt.  Asymptomatic.  Tolerating med w/o difficulty.  Check labs.  No anticipated med changes.

## 2013-10-15 NOTE — Patient Instructions (Addendum)
Follow up as scheduled We'll send you to pulmonary to evaluate your lungs and why you keep getting pneumonia We'll notify you of your lab results and make any changes if needed Continue your medications as directed- we will adjust or add based on your lab results If you again have shortness of breath, worsening, cough, or other concerns- please call! Hang in there! Welcome!  We're glad to have you!

## 2013-10-15 NOTE — Assessment & Plan Note (Signed)
New to provider.  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  Not currently on ASA therapy.  May be appropriate to start if pt's CVA was embolic.  Will follow.

## 2013-10-15 NOTE — Progress Notes (Signed)
   Subjective:    Patient ID: [name], male    DOB: [birth year], 80 y.o.   MRN: [id number]  HPI New to establish.  Previous MD- [facility] at [facility], [place], [place].  Recently moved in w/ son in [place].  HTN- chronic problem, on Valsartan.  Was previously on Metoprolol but stopped this.  COPD- chronic problem as listed by previous MD but pt not on current inhaler.  Pt denies ever using inhaler.    Osteoporosis- per previous MD records, last DEXA was 'a couple of years ago'.  On OTC Ca and Vit D but not on bisphosphonate.  Pt has had multiple compression fxs and will take Hydrocodone 10/325 prn.  CVA- 3 years ago, 'it was mild'.  No residual effects.  'by the time i got to the hospital, it was over'.  No meds.  PNA- pt was seen 8/11 at [facility] and started on Levaquin daily.  Pt reports he is still coughing.  No current fevers.  Pt reports this is 3rd episode of PNA this year- hospitalized twice.  Some residual SOB.  Pt denies this at baseline.  Hyperlipidemia- chronic problem, according to records from previous MD pt is to be on Lipitor 40 but pt is not and is not aware of this.  Low T- chronic problem, on Angrogel.    R carotid stenosis- pt is to have [place] f/u in 1 yr from July 2015.  (due July 2016)   Review of Systems For ROS see HPI     Objective:   Physical Exam  Vitals reviewed. Constitutional: He is oriented to person, place, and time. He appears well-developed. No distress.  Thin, frail appearing  HENT:  Head: Normocephalic and atraumatic.  Poor dentition, missing most teeth w/ exception of front lower  Eyes: Conjunctivae and EOM are normal. Pupils are equal, round, and reactive to light.  Neck: Normal range of motion. Neck supple. No thyromegaly present.  Cardiovascular: Normal rate, regular rhythm, normal heart sounds and intact distal pulses.   No murmur heard. Pulmonary/Chest: Effort normal. No respiratory distress.  Crackles in RLL, no cough heard    Abdominal: Soft. Bowel sounds are normal. He exhibits no distension.  Musculoskeletal: He exhibits no edema.  Lymphadenopathy:    He has no cervical adenopathy.  Neurological: He is alert and oriented to person, place, and time. No cranial nerve deficit.  Skin: Skin is warm and dry.  Psychiatric: He has a normal mood and affect. His behavior is normal.          Assessment & Plan:

## 2013-10-15 NOTE — Assessment & Plan Note (Signed)
New to provider, pt is unaware of this dx despite being on previous problem list from last provider.  Not currently on any inhalers.  Based on hx of recurrent PNA, will refer to pulmonary for evaluation and tx.  Pt expressed understanding and is in agreement w/ plan.

## 2013-10-15 NOTE — Assessment & Plan Note (Signed)
New to provider, source of chronic pain for pt.  Using hydrocodone prn.  Has had multiple kyphoplasty procedures done.  Pt is not currently receiving tx for osteoporosis.  Will attempt to get previous DEXAs for review.

## 2013-10-15 NOTE — Assessment & Plan Note (Signed)
New to provider, ongoing for pt.  Pt is on Ca and Vit D but not a bisphosphonate.  Unclear as to date of last DEXA scan- attempting to get old records.  Check Vit D level.  Replete prn.

## 2013-10-15 NOTE — Assessment & Plan Note (Signed)
New to provider, recurrent issue for pt.  Reviewed note from UC.  Pt is to complete levaquin course as directed.  No longer coughing.  Still w/ some crackles at R base.  Check CBC.  Refer to pulm.

## 2013-10-15 NOTE — Assessment & Plan Note (Signed)
New to provider, on pt's previous problem list but he was unaware of this.  According to prior med list, he is to be on Lipitor but pt is unaware of this.  Check labs to determine if statin is needed.  Given carotid stenosis and hx of CVA, suspect he will need to start statin.

## 2013-10-16 LAB — TESTOSTERONE, FREE, TOTAL, SHBG
SEX HORMONE BINDING: 44 nmol/L (ref 13–71)
TESTOSTERONE: 197 ng/dL — AB (ref 300–890)
Testosterone, Free: 31.2 pg/mL — ABNORMAL LOW (ref 47.0–244.0)
Testosterone-% Free: 1.6 % (ref 1.6–2.9)

## 2013-10-17 ENCOUNTER — Other Ambulatory Visit: Payer: Self-pay | Admitting: General Practice

## 2013-10-17 ENCOUNTER — Encounter: Payer: Self-pay | Admitting: General Practice

## 2013-10-17 DIAGNOSIS — J189 Pneumonia, unspecified organism: Secondary | ICD-10-CM

## 2013-10-17 MED ORDER — TESTOSTERONE 50 MG/5GM (1%) TD GEL: 10.0000 g | Freq: Every day | TRANSDERMAL | Status: DC

## 2013-10-17 MED ORDER — ATORVASTATIN CALCIUM 20 MG PO TABS: 20.0000 mg | ORAL_TABLET | Freq: Every day | ORAL | Status: DC

## 2013-10-17 MED ORDER — VITAMIN D (ERGOCALCIFEROL) 1.25 MG (50000 UNIT) PO CAPS: 50000.0000 [IU] | ORAL_CAPSULE | ORAL | Status: DC

## 2013-10-22 ENCOUNTER — Telehealth: Payer: Self-pay | Admitting: Family Medicine

## 2013-10-22 ENCOUNTER — Telehealth: Payer: Self-pay | Admitting: *Deleted

## 2013-10-22 MED ORDER — TESTOSTERONE 50 MG/5GM (1%) TD GEL: 10.0000 g | Freq: Every day | TRANSDERMAL | Status: DC

## 2013-10-22 NOTE — Telephone Encounter (Signed)
Caller name: Clarene Critchley  Relation to pt: other / [place] back number: [phone]  Reason for call:   prior-auth needed for rx testosterone (ANDROGEL) 50 MG/5GM (1%) GEL. [facility] faxed over a prior-auth form please fill out and fax back as per there request

## 2013-10-22 NOTE — Telephone Encounter (Signed)
Prior auth completed - Testerone 50 mg gel  denied . Preferred products - fortesta , testim, and androderm.

## 2013-10-22 NOTE — Telephone Encounter (Signed)
Med filled, pt notified.

## 2013-10-22 NOTE — Telephone Encounter (Signed)
Pt is to have Testim, new rx sent to reflect this change.

## 2013-10-22 NOTE — Telephone Encounter (Signed)
Ok to change to 10g of Testim daily

## 2013-10-23 ENCOUNTER — Encounter: Payer: Self-pay | Admitting: Internal Medicine

## 2013-10-23 ENCOUNTER — Ambulatory Visit (INDEPENDENT_AMBULATORY_CARE_PROVIDER_SITE_OTHER): Payer: Medicare Other | Admitting: Internal Medicine

## 2013-10-23 VITALS — BP 110/60 | HR 109 | Temp 97.7°F | Ht 66.5 in | Wt 139.8 lb

## 2013-10-23 DIAGNOSIS — Z23 Encounter for immunization: Secondary | ICD-10-CM | POA: Diagnosis not present

## 2013-10-23 DIAGNOSIS — J449 Chronic obstructive pulmonary disease, unspecified: Secondary | ICD-10-CM

## 2013-10-23 DIAGNOSIS — I6529 Occlusion and stenosis of unspecified carotid artery: Secondary | ICD-10-CM

## 2013-10-23 DIAGNOSIS — J189 Pneumonia, unspecified organism: Secondary | ICD-10-CM | POA: Diagnosis not present

## 2013-10-23 NOTE — Progress Notes (Signed)
   Subjective:    Patient ID: [name], male    DOB: Jan 24, 1932 MRN: [id number]  HPI  51 yowm quit smoking around 1990 with multiple pna's ? RLL all taken care of in [facility] Jan 2015  And fully recovered and moved to [facility] summer 2015 cough/ sob ? Fever > UC > [place] referred 10/23/2013 to pulmonary clinic for ? Reason for recurrent pna?    10/23/2013 1st [facility] office visit/ Anacleto Batterman  Chief Complaint  Patient presents with  . Pulmonary Consult    Referred by Dr. [name] for eval of COPD. Pt states "getting over pneumonia"- c/o occ prod cough with minimal clear sputum.    baseline able to fxn normally in [place] including walking up inclines s limiting sob after each bout of "pna". No recent dental work though does have poor dentition, h/o cva 3 y prior to [place]  But no dysphagia. Minimal am cough now - carries dx of copd but never used inhalers   No h/o asbestos exp/ rheumatologic dx/chemo/ amiodarone exp  No obvious day to day or daytime variabilty or assoc  h  cp or chest tightness, subjective wheeze overt sinus or hb symptoms. No unusual exp hx or h/o childhood pna/ asthma or knowledge of premature birth.  Sleeping ok without nocturnal  or early am exacerbation  of respiratory  c/o's or need for noct saba. Also denies any obvious fluctuation of symptoms with weather or environmental changes or other aggravating or alleviating factors except as outlined above   Current Medications, Allergies, Complete Past Medical History, Past Surgical History, Family History, and Social History were reviewed in [employer] record.      Review of Systems  Constitutional: Negative for fever, chills, activity change, appetite change and unexpected weight change.  HENT: Negative for congestion, dental problem, postnasal drip, rhinorrhea, sneezing, sore throat, trouble swallowing and voice change.   Eyes: Negative for visual disturbance.  Respiratory: Positive for cough. Negative  for choking and shortness of breath.   Cardiovascular: Negative for chest pain and leg swelling.  Gastrointestinal: Negative for nausea, vomiting and abdominal pain.  Genitourinary: Negative for difficulty urinating.  Musculoskeletal: Negative for arthralgias.  Skin: Negative for rash.  Psychiatric/Behavioral: Negative for behavioral problems and confusion.       Objective:   Physical Exam  Wt Readings from Last 3 Encounters:  10/23/13 139 lb 12.8 oz (63.413 kg)  10/15/13 136 lb 6.4 oz (61.871 kg)      HEENT: nl dentition, turbinates, and orophanx. Nl external ear canals without cough reflex   NECK :  without JVD/Nodes/TM/ nl carotid upstrokes bilaterally   LUNGS: no acc muscle use,  Bilateral min insp crackles  CV:  RRR  no s3 or murmur or increase in P2, no edema   ABD:  soft and nontender with nl excursion in the supine position. No bruits or organomegaly, bowel sounds nl  MS:  warm without deformities, calf tenderness, cyanosis - No  clubbing  SKIN: warm and dry without lesions    NEURO:  alert, approp, no deficits    10/08/13 Mild coarsening of the pulmonary interstitium is identified. No  consolidative process, pneumothorax or effusion is seen. Heart size  is normal. Multiple thoracic compression fracture deformities are  identified. The patient is status post vertebral augmentation in the  mid and lower thoracic spine and upper lumbar spine.      Assessment & Plan:

## 2013-10-23 NOTE — Patient Instructions (Signed)
prevnar - 13 today  Please schedule a follow up office visit in 6 weeks, call sooner if needed with pfts and cxr

## 2013-10-23 NOTE — Telephone Encounter (Signed)
Can you please call insurance. Per last Telephone note pt insurance prefers testim. The pharmacy is stating that this is still requiring a PA.

## 2013-10-23 NOTE — Assessment & Plan Note (Signed)
Not clear if he has sign copd or not but could have mild bronchiectasis we can't see on a plain film and treatment for now will be conservative as he feels he's getting over this latest bout just like the others s significant limitations/ chronic cough.  Needs pfts and f/u cxr in 6 weeks  prevnar given today as he reports he had "pneumonia shot" in distant past and could not therefore have been prevnar he received

## 2013-10-23 NOTE — Assessment & Plan Note (Addendum)
10/08/13  Levaquin 500 Qday x 10 days rx > acute symptoms resolved  prevnar 13 given  W/u in progress.

## 2013-10-24 ENCOUNTER — Telehealth: Payer: Self-pay | Admitting: General Practice

## 2013-10-24 DIAGNOSIS — R7989 Other specified abnormal findings of blood chemistry: Secondary | ICD-10-CM

## 2013-10-24 NOTE — Telephone Encounter (Signed)
Called the insurance company to try and get a PA for pt's testosterone. PA tried for Testim notified by [employer] that this was denied. Please advise if referral needed for Endo or urology.

## 2013-10-24 NOTE — Telephone Encounter (Signed)
At this time, he needs referral to Endo for persistent low T despite medication (since we cannot get medication approved)

## 2013-10-24 NOTE — Telephone Encounter (Signed)
Referral placed.

## 2013-11-01 ENCOUNTER — Encounter (HOSPITAL_COMMUNITY): Payer: Self-pay | Admitting: Emergency Medicine

## 2013-11-01 ENCOUNTER — Emergency Department (HOSPITAL_COMMUNITY): Payer: Medicare Other

## 2013-11-01 ENCOUNTER — Emergency Department (INDEPENDENT_AMBULATORY_CARE_PROVIDER_SITE_OTHER)
Admission: EM | Admit: 2013-11-01 | Discharge: 2013-11-01 | Disposition: A | Discharge status: Home / Self Care | Payer: Medicare Other | Source: Home / Self Care | Attending: Family Medicine | Admitting: Family Medicine

## 2013-11-01 ENCOUNTER — Inpatient Hospital Stay (HOSPITAL_COMMUNITY)
Admission: EM | Admit: 2013-11-01 | Discharge: 2013-11-03 | DRG: 340 | Disposition: A | Discharge status: Home / Self Care | Payer: Medicare Other | Attending: Internal Medicine | Admitting: Internal Medicine

## 2013-11-01 ENCOUNTER — Encounter (HOSPITAL_COMMUNITY): Payer: Self-pay | Admitting: Family Medicine

## 2013-11-01 DIAGNOSIS — Z7982 Long term (current) use of aspirin: Secondary | ICD-10-CM | POA: Diagnosis not present

## 2013-11-01 DIAGNOSIS — I739 Peripheral vascular disease, unspecified: Secondary | ICD-10-CM | POA: Diagnosis present

## 2013-11-01 DIAGNOSIS — K352 Acute appendicitis with generalized peritonitis, without abscess: Principal | ICD-10-CM | POA: Diagnosis present

## 2013-11-01 DIAGNOSIS — K8020 Calculus of gallbladder without cholecystitis without obstruction: Secondary | ICD-10-CM | POA: Diagnosis not present

## 2013-11-01 DIAGNOSIS — Z87891 Personal history of nicotine dependence: Secondary | ICD-10-CM | POA: Diagnosis not present

## 2013-11-01 DIAGNOSIS — K3580 Unspecified acute appendicitis: Secondary | ICD-10-CM | POA: Diagnosis present

## 2013-11-01 DIAGNOSIS — Z79899 Other long term (current) drug therapy: Secondary | ICD-10-CM

## 2013-11-01 DIAGNOSIS — R000 Tachycardia, unspecified: Secondary | ICD-10-CM | POA: Diagnosis not present

## 2013-11-01 DIAGNOSIS — R1031 Right lower quadrant pain: Secondary | ICD-10-CM | POA: Diagnosis not present

## 2013-11-01 DIAGNOSIS — J4489 Other specified chronic obstructive pulmonary disease: Secondary | ICD-10-CM | POA: Diagnosis present

## 2013-11-01 DIAGNOSIS — E785 Hyperlipidemia, unspecified: Secondary | ICD-10-CM

## 2013-11-01 DIAGNOSIS — I2510 Atherosclerotic heart disease of native coronary artery without angina pectoris: Secondary | ICD-10-CM | POA: Diagnosis present

## 2013-11-01 DIAGNOSIS — J189 Pneumonia, unspecified organism: Secondary | ICD-10-CM

## 2013-11-01 DIAGNOSIS — J479 Bronchiectasis, uncomplicated: Secondary | ICD-10-CM | POA: Diagnosis present

## 2013-11-01 DIAGNOSIS — Z8673 Personal history of transient ischemic attack (TIA), and cerebral infarction without residual deficits: Secondary | ICD-10-CM | POA: Diagnosis not present

## 2013-11-01 DIAGNOSIS — J449 Chronic obstructive pulmonary disease, unspecified: Secondary | ICD-10-CM

## 2013-11-01 DIAGNOSIS — K35209 Acute appendicitis with generalized peritonitis, without abscess, unspecified as to perforation: Principal | ICD-10-CM | POA: Diagnosis present

## 2013-11-01 DIAGNOSIS — I10 Essential (primary) hypertension: Secondary | ICD-10-CM | POA: Diagnosis present

## 2013-11-01 DIAGNOSIS — M810 Age-related osteoporosis without current pathological fracture: Secondary | ICD-10-CM | POA: Diagnosis present

## 2013-11-01 DIAGNOSIS — R7989 Other specified abnormal findings of blood chemistry: Secondary | ICD-10-CM

## 2013-11-01 LAB — CBC WITH DIFFERENTIAL/PLATELET
BASOS ABS: 0 10*3/uL (ref 0.0–0.1)
BASOS PCT: 0 % (ref 0–1)
EOS PCT: 2 % (ref 0–5)
Eosinophils Absolute: 0.2 10*3/uL (ref 0.0–0.7)
HEMATOCRIT: 45.3 % (ref 39.0–52.0)
Hemoglobin: 15.5 g/dL (ref 13.0–17.0)
LYMPHS PCT: 14 % (ref 12–46)
Lymphs Abs: 1.7 10*3/uL (ref 0.7–4.0)
MCH: 30.8 pg (ref 26.0–34.0)
MCHC: 34.2 g/dL (ref 30.0–36.0)
MCV: 90.1 fL (ref 78.0–100.0)
Monocytes Absolute: 1.2 10*3/uL — ABNORMAL HIGH (ref 0.1–1.0)
Monocytes Relative: 10 % (ref 3–12)
Neutro Abs: 9 10*3/uL — ABNORMAL HIGH (ref 1.7–7.7)
Neutrophils Relative %: 74 % (ref 43–77)
Platelets: 234 10*3/uL (ref 150–400)
RBC: 5.03 MIL/uL (ref 4.22–5.81)
RDW: 13.8 % (ref 11.5–15.5)
WBC: 12.2 10*3/uL — ABNORMAL HIGH (ref 4.0–10.5)

## 2013-11-01 LAB — POCT URINALYSIS DIP (DEVICE)
Bilirubin Urine: NEGATIVE
Glucose, UA: NEGATIVE mg/dL
Hgb urine dipstick: NEGATIVE
KETONES UR: NEGATIVE mg/dL
Leukocytes, UA: NEGATIVE
Nitrite: NEGATIVE
PH: 6 (ref 5.0–8.0)
PROTEIN: NEGATIVE mg/dL
SPECIFIC GRAVITY, URINE: 1.01 (ref 1.005–1.030)
Urobilinogen, UA: 1 mg/dL (ref 0.0–1.0)

## 2013-11-01 LAB — COMPREHENSIVE METABOLIC PANEL
ALT: 28 U/L (ref 0–53)
AST: 22 U/L (ref 0–37)
Albumin: 3.5 g/dL (ref 3.5–5.2)
Alkaline Phosphatase: 94 U/L (ref 39–117)
Anion gap: 14 (ref 5–15)
BUN: 10 mg/dL (ref 6–23)
CO2: 23 meq/L (ref 19–32)
CREATININE: 0.7 mg/dL (ref 0.50–1.35)
Calcium: 9.2 mg/dL (ref 8.4–10.5)
Chloride: 100 mEq/L (ref 96–112)
GFR calc Af Amer: >90 mL/min (ref >90)
GFR calc non Af Amer: 87 mL/min — ABNORMAL LOW (ref >90)
Glucose, Bld: 105 mg/dL — ABNORMAL HIGH (ref 70–99)
Potassium: 4.3 mEq/L (ref 3.7–5.3)
Sodium: 137 mEq/L (ref 137–147)
Total Bilirubin: 0.9 mg/dL (ref 0.3–1.2)
Total Protein: 6.6 g/dL (ref 6.0–8.3)

## 2013-11-01 MED ORDER — IOHEXOL 300 MG/ML  SOLN
100.0000 mL | Freq: Once | INTRAMUSCULAR | Status: AC | PRN
  Administered 2013-11-01: 100 mL via INTRAVENOUS

## 2013-11-01 NOTE — ED Notes (Signed)
Patient here after being sent by PCP for concern for possible appendicitis. Patient currently endorsing RLQ pain. States that it started yesterday and gradually got worse throughout the day. States that the pain gets worse with bouncing and the RLQ is tender. States has been having bowel movements normally with last one yesterday.

## 2013-11-01 NOTE — ED Notes (Signed)
C/o right side inguinal pain onset 2 days Pain is constant and increases w/activity/pressure Hx of enlarged prostate Denies f/v/n/d, urinary sx Slow gait; alert, no signs of acute distress.

## 2013-11-01 NOTE — ED Notes (Signed)
Patient transported to CT 

## 2013-11-01 NOTE — ED Provider Notes (Signed)
CSN: [id number]     Arrival date & time 11/01/13  2019 History   First MD Initiated Contact with Patient 11/01/13 2203     Chief Complaint  Patient presents with  . Abdominal Pain     (Consider location/radiation/quality/duration/timing/severity/associated sxs/prior Treatment) HPI Comments: Patient here complaining of worsening right lower quadrant pain that began yesterday and has become gradually worse. Was seen in urgent care Center and sent here to rule out appendicitis. Symptoms have been severe and worse with movements. Notes anorexia. Pain does not radiate. Has been using Norco without relief no vomiting diarrhea at this time. Symptoms have been persistent. Pain characterized as sharp  Patient is a 80 y.o. male presenting with abdominal pain. The history is provided by the patient.  Abdominal Pain   Past Medical History  Diagnosis Date  . Osteoporosis   . COPD (chronic obstructive pulmonary disease)   . Coronary artery disease   . Hypertension   . BPH (benign prostatic hyperplasia)   . HLD (hyperlipidemia)   . Pneumonia   . Arthritis     hands   . CVA (cerebral infarction) 3 years ago    mild stroke   Past Surgical History  Procedure Laterality Date  . Back surgery     Family History  Problem Relation Age of Onset  . Colon cancer Brother    History  Substance Use Topics  . Smoking status: Former Smoker -- 1.00 packs/day for 35 years    Types: Cigarettes    Quit date: 02/29/1988  . Smokeless tobacco: Never Used  . Alcohol Use: No    Review of Systems  Gastrointestinal: Positive for abdominal pain.  All other systems reviewed and are negative.     Allergies  Review of patient's allergies indicates no known allergies.  Home Medications   Prior to Admission medications   Medication Sig Start Date End Date Taking? Authorizing Provider  aspirin EC 81 MG tablet Take 81 mg by mouth daily.   Yes Historical Provider, MD  atorvastatin (LIPITOR) 20 MG tablet  Take 1 tablet (20 mg total) by mouth daily. 10/17/13  Yes [name], MD  calcium carbonate (OS-CAL) 600 MG TABS tablet Take 600 mg by mouth daily with breakfast.   Yes Historical Provider, MD  docusate sodium (COLACE) 100 MG capsule Take 100 mg by mouth as needed for mild constipation.   Yes Historical Provider, MD  HYDROcodone-acetaminophen (NORCO) 10-325 MG per tablet Take 1 tablet by mouth every 6 (six) hours as needed for moderate pain.    Yes Historical Provider, MD  testosterone (TESTIM) 50 MG/5GM (1%) GEL Place 10 g onto the skin daily. 10/22/13  Yes [name], MD  valsartan (DIOVAN) 80 MG tablet Take 80 mg by mouth daily.   Yes Historical Provider, MD  Vitamin D, Ergocalciferol, (DRISDOL) 50000 UNITS CAPS capsule Take 1 capsule (50,000 Units total) by mouth every 7 (seven) days. 10/17/13  Yes [name], MD   BP 160/94  Pulse 91  Temp(Src) 98.4 F (36.9 C) (Oral)  Resp 18  Ht 5\' 7"  (1.702 m)  Wt 135 lb (61.236 kg)  BMI 21.14 kg/m2  SpO2 95% Physical Exam  Nursing note and vitals reviewed. Constitutional: He is oriented to person, place, and time. He appears well-developed and well-nourished.  Non-toxic appearance. No distress.  HENT:  Head: Normocephalic and atraumatic.  Eyes: Conjunctivae, EOM and lids are normal. Pupils are equal, round, and reactive to light.  Neck: Normal range of motion.  Neck supple. No tracheal deviation present. No mass present.  Cardiovascular: Normal rate, regular rhythm and normal heart sounds.  Exam reveals no gallop.   No murmur heard. Pulmonary/Chest: Effort normal and breath sounds normal. No stridor. No respiratory distress. He has no decreased breath sounds. He has no wheezes. He has no rhonchi. He has no rales.  Abdominal: Soft. Normal appearance and bowel sounds are normal. He exhibits no distension. There is tenderness in the right lower quadrant. There is no rigidity, no rebound, no guarding and no CVA tenderness.     Musculoskeletal: Normal range of motion. He exhibits no edema and no tenderness.  Neurological: He is alert and oriented to person, place, and time. He has normal strength. No cranial nerve deficit or sensory deficit. GCS eye subscore is 4. GCS verbal subscore is 5. GCS motor subscore is 6.  Skin: Skin is warm and dry. No abrasion and no rash noted.  Psychiatric: He has a normal mood and affect. His speech is normal and behavior is normal.    ED Course  Procedures (including critical care time) Labs Review Labs Reviewed  CBC WITH DIFFERENTIAL - Abnormal; Notable for the following:    WBC 12.2 (*)    Neutro Abs 9.0 (*)    Monocytes Absolute 1.2 (*)    All other components within normal limits  COMPREHENSIVE METABOLIC PANEL - Abnormal; Notable for the following:    Glucose, Bld 105 (*)    GFR calc non Af Amer 87 (*)    All other components within normal limits    Imaging Review Ct Abdomen Pelvis W Contrast  11/01/2013   CLINICAL DATA:  Worsening right groin pain.  EXAM: CT ABDOMEN AND PELVIS WITH CONTRAST  TECHNIQUE: Multidetector CT imaging of the abdomen and pelvis was performed using the standard protocol following bolus administration of intravenous contrast.  CONTRAST:  138mL OMNIPAQUE IOHEXOL 300 MG/ML  SOLN  COMPARISON:  None.  FINDINGS: Liver: Tiny sub-cm left hepatic lobe cyst noted. No liver masses identified.  Gallbladder/Biliary: Tiny gallstones seen, without evidence of cholecystitis or biliary dilatation.  Pancreas: No mass, inflammatory changes, or other parenchymal abnormality identified.  Spleen:  Within normal limits in size and appearance.  Adrenal Glands:  No mass identified.  Kidneys/Urinary Tract: No masses identified. No evidence of hydronephrosis. Tiny less than 5 mm nonobstructive calculi noted in the lower pole of the left kidney.  Lymph Nodes:  No pathologically enlarged lymph nodes identified.  Pelvic/Reproductive Organs: Mildly enlarged prostate with mass effect  on bladder base.  Bowel/Peritoneum: Dilated appendix seen measuring 15 mm in diameter. Mild periappendiceal inflammatory changes are seen, consistent with acute appendicitis. No evidence of abscess or free fluid.  Vascular:  No evidence of abdominal aortic aneurysm.  Musculoskeletal:  No suspicious bone lesions identified.  Other:  None.  IMPRESSION: Positive for acute appendicitis. No evidence of abscess or other complication.  Cholelithiasis.  No radiographic evidence of cholecystitis.  Mildly enlarged prostate.   Electronically Signed   By: Earle Gell M.D.   On: 11/01/2013 23:28     EKG Interpretation None      MDM   Final diagnoses:  None    Pt to be admitted for appendicitis    Leota Jacobsen, MD 11/01/13 2340

## 2013-11-01 NOTE — ED Notes (Signed)
Pt declined shuttle service; does not want to leave vehicle here Has been adv to go immediately to ER for further eval Verbalized understanding.

## 2013-11-01 NOTE — ED Provider Notes (Signed)
CSN: [id number]     Arrival date & time 11/01/13  1930 History   First MD Initiated Contact with Patient 11/01/13 1945     Chief Complaint  Patient presents with  . Abdominal Pain   (Consider location/radiation/quality/duration/timing/severity/associated sxs/prior Treatment) HPI  RLQ pain: started 1.5 days ago. pepto bismal and gas-x w/o benefit. Constant. Same to slightly worse. Worse w/ certain movements. Sharp. Denies CP, SOB, nausea, diarreha, fever. Typically 4-5 soft BM wkly. No sick contacts. Started on Vit D 50K units a few wks ago. Returned from a prolonged road trip 2 days ago.    CAP: resolved after levaquin. No longer SOB or w/ fevers.    Past Medical History  Diagnosis Date  . Osteoporosis   . COPD (chronic obstructive pulmonary disease)   . Coronary artery disease   . Hypertension   . BPH (benign prostatic hyperplasia)   . HLD (hyperlipidemia)   . Pneumonia   . Arthritis     hands   . CVA (cerebral infarction) 3 years ago    mild stroke   Past Surgical History  Procedure Laterality Date  . Back surgery     Family History  Problem Relation Age of Onset  . Colon cancer Brother    History  Substance Use Topics  . Smoking status: Former Smoker -- 1.00 packs/day for 35 years    Types: Cigarettes    Quit date: 02/29/1988  . Smokeless tobacco: Never Used  . Alcohol Use: No    Review of Systems Per HPI with all other pertinent systems negative.   Allergies  Review of patient's allergies indicates no known allergies.  Home Medications   Prior to Admission medications   Medication Sig Start Date End Date Taking? Authorizing Provider  calcium carbonate (OS-CAL) 600 MG TABS tablet Take 600 mg by mouth daily with breakfast.   Yes Historical Provider, MD  testosterone (TESTIM) 50 MG/5GM (1%) GEL Place 10 g onto the skin daily. 10/22/13  Yes [name], MD  valsartan (DIOVAN) 80 MG tablet Take 80 mg by mouth daily.   Yes Historical Provider, MD  aspirin  EC 81 MG tablet Take 81 mg by mouth daily.    Historical Provider, MD  atorvastatin (LIPITOR) 20 MG tablet Take 1 tablet (20 mg total) by mouth daily. 10/17/13   [name], MD  docusate sodium (COLACE) 100 MG capsule Take 100 mg by mouth as needed for mild constipation.    Historical Provider, MD  HYDROcodone-acetaminophen (NORCO) 10-325 MG per tablet Take 1 tablet by mouth every 6 (six) hours as needed.    Historical Provider, MD  Vitamin D, Ergocalciferol, (DRISDOL) 50000 UNITS CAPS capsule Take 1 capsule (50,000 Units total) by mouth every 7 (seven) days. 10/17/13   [name], MD   BP 165/105  Pulse 99  Temp(Src) 98.2 F (36.8 C) (Oral)  Resp 18  SpO2 95% Physical Exam  Constitutional: He is oriented to person, place, and time. He appears well-developed and well-nourished. No distress.  HENT:  Head: Normocephalic and atraumatic.  Eyes: EOM are normal. Pupils are equal, round, and reactive to light.  Neck: Normal range of motion.  Cardiovascular: Normal rate, normal heart sounds and intact distal pulses.   No murmur heard. Pulmonary/Chest: Effort normal and breath sounds normal. No respiratory distress.  Abdominal:  Decreased bowel sounds. Point tenderness at McBurny's point w/ guarding.  Neg Murphy's sign.   Musculoskeletal: Normal range of motion. He exhibits no edema.  Neurological: He  is alert and oriented to person, place, and time. No cranial nerve deficit. Coordination normal.  Skin: Skin is warm. No rash noted. He is not diaphoretic. No erythema.  Psychiatric: He has a normal mood and affect. His behavior is normal. Thought content normal.    ED Course  Procedures (including critical care time) Labs Review Labs Reviewed  POCT URINALYSIS DIP (DEVICE)    Imaging Review No results found.   MDM   1. Right lower quadrant abdominal pain    Appendicitis vs constipation vs muscle strain Constipation unlikely as has 4-5 soft BM wkly Muscle strain  likely given recent road trip and contractures but cannot r/o appendicitis w/o further imaging/labs. Will send pt to [facility] ED for further workup. UA nml Pt BP elevated and near tachy secondary to pain.  Pt stable  Precautions given and all questions answered  Linna Darner, MD Family Medicine 11/01/2013, 8:09 PM       Waldemar Dickens, MD 11/01/13 2009

## 2013-11-01 NOTE — Discharge Instructions (Signed)
Your abdominal pain could be as simple as a strained muscle from your trip or constipation, but there is concern that you may have appendicitis. Please go to the emergency room for further care

## 2013-11-02 ENCOUNTER — Inpatient Hospital Stay (HOSPITAL_COMMUNITY): Payer: Medicare Other | Admitting: Anesthesiology

## 2013-11-02 ENCOUNTER — Encounter (HOSPITAL_COMMUNITY): Payer: Self-pay | Admitting: Internal Medicine

## 2013-11-02 ENCOUNTER — Encounter (HOSPITAL_COMMUNITY)
Admission: EM | Disposition: A | Discharge status: Home / Self Care | Payer: Self-pay | Source: Home / Self Care | Attending: Internal Medicine

## 2013-11-02 DIAGNOSIS — Z87891 Personal history of nicotine dependence: Secondary | ICD-10-CM | POA: Diagnosis not present

## 2013-11-02 DIAGNOSIS — I10 Essential (primary) hypertension: Secondary | ICD-10-CM

## 2013-11-02 DIAGNOSIS — Z8673 Personal history of transient ischemic attack (TIA), and cerebral infarction without residual deficits: Secondary | ICD-10-CM | POA: Diagnosis not present

## 2013-11-02 DIAGNOSIS — Z79899 Other long term (current) drug therapy: Secondary | ICD-10-CM | POA: Diagnosis not present

## 2013-11-02 DIAGNOSIS — J479 Bronchiectasis, uncomplicated: Secondary | ICD-10-CM | POA: Diagnosis present

## 2013-11-02 DIAGNOSIS — E785 Hyperlipidemia, unspecified: Secondary | ICD-10-CM | POA: Diagnosis not present

## 2013-11-02 DIAGNOSIS — M810 Age-related osteoporosis without current pathological fracture: Secondary | ICD-10-CM | POA: Diagnosis present

## 2013-11-02 DIAGNOSIS — J449 Chronic obstructive pulmonary disease, unspecified: Secondary | ICD-10-CM

## 2013-11-02 DIAGNOSIS — R000 Tachycardia, unspecified: Secondary | ICD-10-CM | POA: Diagnosis not present

## 2013-11-02 DIAGNOSIS — I2510 Atherosclerotic heart disease of native coronary artery without angina pectoris: Secondary | ICD-10-CM | POA: Diagnosis present

## 2013-11-02 DIAGNOSIS — K352 Acute appendicitis with generalized peritonitis, without abscess: Secondary | ICD-10-CM | POA: Diagnosis not present

## 2013-11-02 DIAGNOSIS — K3580 Unspecified acute appendicitis: Secondary | ICD-10-CM | POA: Diagnosis present

## 2013-11-02 DIAGNOSIS — Z7982 Long term (current) use of aspirin: Secondary | ICD-10-CM | POA: Diagnosis not present

## 2013-11-02 DIAGNOSIS — I739 Peripheral vascular disease, unspecified: Secondary | ICD-10-CM | POA: Diagnosis present

## 2013-11-02 HISTORY — PX: LAPAROSCOPIC APPENDECTOMY: SHX408

## 2013-11-02 LAB — COMPREHENSIVE METABOLIC PANEL
ALBUMIN: 3.1 g/dL — AB (ref 3.5–5.2)
ALK PHOS: 85 U/L (ref 39–117)
ALT: 24 U/L (ref 0–53)
ANION GAP: 14 (ref 5–15)
AST: 17 U/L (ref 0–37)
BUN: 9 mg/dL (ref 6–23)
CALCIUM: 8.9 mg/dL (ref 8.4–10.5)
CO2: 24 mEq/L (ref 19–32)
CREATININE: 0.57 mg/dL (ref 0.50–1.35)
Chloride: 101 mEq/L (ref 96–112)
GFR calc non Af Amer: >90 mL/min (ref >90)
Glucose, Bld: 96 mg/dL (ref 70–99)
POTASSIUM: 3.8 meq/L (ref 3.7–5.3)
Sodium: 139 mEq/L (ref 137–147)
TOTAL PROTEIN: 6.1 g/dL (ref 6.0–8.3)
Total Bilirubin: 0.4 mg/dL (ref 0.3–1.2)

## 2013-11-02 LAB — CBC WITH DIFFERENTIAL/PLATELET
BASOS ABS: 0 10*3/uL (ref 0.0–0.1)
Basophils Relative: 0 % (ref 0–1)
Eosinophils Absolute: 0.3 10*3/uL (ref 0.0–0.7)
Eosinophils Relative: 2 % (ref 0–5)
HEMATOCRIT: 44.8 % (ref 39.0–52.0)
HEMOGLOBIN: 15.1 g/dL (ref 13.0–17.0)
LYMPHS PCT: 12 % (ref 12–46)
Lymphs Abs: 1.4 10*3/uL (ref 0.7–4.0)
MCH: 30.3 pg (ref 26.0–34.0)
MCHC: 33.7 g/dL (ref 30.0–36.0)
MCV: 89.8 fL (ref 78.0–100.0)
MONOS PCT: 12 % (ref 3–12)
Monocytes Absolute: 1.3 10*3/uL — ABNORMAL HIGH (ref 0.1–1.0)
NEUTROS ABS: 8.4 10*3/uL — AB (ref 1.7–7.7)
Neutrophils Relative %: 74 % (ref 43–77)
Platelets: 228 10*3/uL (ref 150–400)
RBC: 4.99 MIL/uL (ref 4.22–5.81)
RDW: 13.8 % (ref 11.5–15.5)
WBC: 11.4 10*3/uL — AB (ref 4.0–10.5)

## 2013-11-02 LAB — GLUCOSE, CAPILLARY
GLUCOSE-CAPILLARY: 91 mg/dL (ref 70–99)
GLUCOSE-CAPILLARY: 109 mg/dL — AB (ref 70–99)
Glucose-Capillary: 101 mg/dL — ABNORMAL HIGH (ref 70–99)
Glucose-Capillary: 99 mg/dL (ref 70–99)

## 2013-11-02 LAB — SURGICAL PCR SCREEN
MRSA, PCR: NEGATIVE
Staphylococcus aureus: NEGATIVE

## 2013-11-02 SURGERY — APPENDECTOMY, LAPAROSCOPIC
Anesthesia: General | Site: Abdomen

## 2013-11-02 MED ORDER — BUPIVACAINE-EPINEPHRINE (PF) 0.25% -1:200000 IJ SOLN
INTRAMUSCULAR | Status: AC
  Filled 2013-11-02: qty 30

## 2013-11-02 MED ORDER — GLYCOPYRROLATE 0.2 MG/ML IJ SOLN
INTRAMUSCULAR | Status: DC | PRN
  Administered 2013-11-02: 0.6 mg via INTRAVENOUS

## 2013-11-02 MED ORDER — ONDANSETRON HCL 4 MG PO TABS: 4.0000 mg | ORAL_TABLET | Freq: Four times a day (QID) | ORAL | Status: DC | PRN

## 2013-11-02 MED ORDER — MORPHINE SULFATE 2 MG/ML IJ SOLN
1.0000 mg | INTRAMUSCULAR | Status: DC | PRN
  Administered 2013-11-02: 1 mg via INTRAVENOUS
  Filled 2013-11-02: qty 1

## 2013-11-02 MED ORDER — FENTANYL CITRATE 0.05 MG/ML IJ SOLN
INTRAMUSCULAR | Status: AC
  Filled 2013-11-02: qty 5

## 2013-11-02 MED ORDER — METRONIDAZOLE IN NACL 5-0.79 MG/ML-% IV SOLN
500.0000 mg | Freq: Three times a day (TID) | INTRAVENOUS | Status: DC
  Administered 2013-11-02: 500 mg via INTRAVENOUS
  Filled 2013-11-02 (×3): qty 100

## 2013-11-02 MED ORDER — NEOSTIGMINE METHYLSULFATE 10 MG/10ML IV SOLN
INTRAVENOUS | Status: AC
  Filled 2013-11-02: qty 2

## 2013-11-02 MED ORDER — SODIUM CHLORIDE 0.9 % IV SOLN
INTRAVENOUS | Status: DC
  Administered 2013-11-02: 02:00:00 via INTRAVENOUS

## 2013-11-02 MED ORDER — ACETAMINOPHEN 325 MG PO TABS: 650.0000 mg | ORAL_TABLET | Freq: Four times a day (QID) | ORAL | Status: DC | PRN

## 2013-11-02 MED ORDER — GLYCOPYRROLATE 0.2 MG/ML IJ SOLN
INTRAMUSCULAR | Status: AC
  Filled 2013-11-02: qty 3

## 2013-11-02 MED ORDER — BUPIVACAINE-EPINEPHRINE 0.25% -1:200000 IJ SOLN
INTRAMUSCULAR | Status: DC | PRN
  Administered 2013-11-02: 30 mL

## 2013-11-02 MED ORDER — DEXTROSE 5 % IV SOLN
2.0000 g | Freq: Every day | INTRAVENOUS | Status: DC
  Administered 2013-11-02: 2 g via INTRAVENOUS
  Filled 2013-11-02 (×2): qty 2

## 2013-11-02 MED ORDER — FENTANYL CITRATE 0.05 MG/ML IJ SOLN
INTRAMUSCULAR | Status: DC | PRN
  Administered 2013-11-02: 50 ug via INTRAVENOUS
  Administered 2013-11-02: 100 ug via INTRAVENOUS
  Administered 2013-11-02: 50 ug via INTRAVENOUS

## 2013-11-02 MED ORDER — LACTATED RINGERS IV SOLN
INTRAVENOUS | Status: DC | PRN
  Administered 2013-11-02 (×2): via INTRAVENOUS

## 2013-11-02 MED ORDER — ROCURONIUM BROMIDE 100 MG/10ML IV SOLN
INTRAVENOUS | Status: DC | PRN
  Administered 2013-11-02: 25 mg via INTRAVENOUS

## 2013-11-02 MED ORDER — SODIUM CHLORIDE 0.9 % IV SOLN
1.0000 g | INTRAVENOUS | Status: DC
  Administered 2013-11-02: 1 g via INTRAVENOUS
  Filled 2013-11-02 (×2): qty 1

## 2013-11-02 MED ORDER — LIDOCAINE HCL (CARDIAC) 20 MG/ML IV SOLN
INTRAVENOUS | Status: DC | PRN
  Administered 2013-11-02: 100 mg via INTRAVENOUS

## 2013-11-02 MED ORDER — SODIUM CHLORIDE 0.9 % IR SOLN
Status: DC | PRN
  Administered 2013-11-02: 1000 mL

## 2013-11-02 MED ORDER — PROPOFOL 10 MG/ML IV BOLUS
INTRAVENOUS | Status: AC
  Filled 2013-11-02: qty 20

## 2013-11-02 MED ORDER — MIDAZOLAM HCL 2 MG/2ML IJ SOLN
INTRAMUSCULAR | Status: AC
  Filled 2013-11-02: qty 2

## 2013-11-02 MED ORDER — PHENYLEPHRINE HCL 10 MG/ML IJ SOLN
INTRAMUSCULAR | Status: DC | PRN
  Administered 2013-11-02: 120 ug via INTRAVENOUS
  Administered 2013-11-02 (×2): 80 ug via INTRAVENOUS
  Administered 2013-11-02: 120 ug via INTRAVENOUS

## 2013-11-02 MED ORDER — ONDANSETRON HCL 4 MG/2ML IJ SOLN
4.0000 mg | Freq: Four times a day (QID) | INTRAMUSCULAR | Status: DC | PRN
  Filled 2013-11-02: qty 2

## 2013-11-02 MED ORDER — HYDRALAZINE HCL 20 MG/ML IJ SOLN
10.0000 mg | INTRAMUSCULAR | Status: DC | PRN
  Administered 2013-11-02: 10 mg via INTRAVENOUS
  Filled 2013-11-02: qty 1

## 2013-11-02 MED ORDER — DEXTROSE 5 % IV SOLN
10.0000 mg | INTRAVENOUS | Status: DC | PRN
  Administered 2013-11-02: 50 ug/min via INTRAVENOUS

## 2013-11-02 MED ORDER — ACETAMINOPHEN 650 MG RE SUPP: 650.0000 mg | Freq: Four times a day (QID) | RECTAL | Status: DC | PRN

## 2013-11-02 MED ORDER — 0.9 % SODIUM CHLORIDE (POUR BTL) OPTIME
TOPICAL | Status: DC | PRN
  Administered 2013-11-02: 1000 mL

## 2013-11-02 MED ORDER — MIDAZOLAM HCL 5 MG/5ML IJ SOLN
INTRAMUSCULAR | Status: DC | PRN
  Administered 2013-11-02: 2 mg via INTRAVENOUS

## 2013-11-02 MED ORDER — IRBESARTAN 75 MG PO TABS
75.0000 mg | ORAL_TABLET | Freq: Every day | ORAL | Status: DC
  Administered 2013-11-03: 75 mg via ORAL
  Filled 2013-11-02 (×2): qty 1

## 2013-11-02 MED ORDER — LACTATED RINGERS IV SOLN: INTRAVENOUS | Status: DC

## 2013-11-02 MED ORDER — NEOSTIGMINE METHYLSULFATE 10 MG/10ML IV SOLN
INTRAVENOUS | Status: DC | PRN
  Administered 2013-11-02: 4 mg via INTRAVENOUS

## 2013-11-02 MED ORDER — SUCCINYLCHOLINE CHLORIDE 20 MG/ML IJ SOLN
INTRAMUSCULAR | Status: DC | PRN
  Administered 2013-11-02: 100 mg via INTRAVENOUS

## 2013-11-02 MED ORDER — HYDROCODONE-ACETAMINOPHEN 5-325 MG PO TABS
1.0000 | ORAL_TABLET | ORAL | Status: DC | PRN
  Administered 2013-11-03: 2 via ORAL
  Administered 2013-11-03: 1 via ORAL
  Filled 2013-11-02: qty 1
  Filled 2013-11-02: qty 2

## 2013-11-02 MED ORDER — ONDANSETRON HCL 4 MG/2ML IJ SOLN
INTRAMUSCULAR | Status: DC | PRN
  Administered 2013-11-02 (×2): 4 mg via INTRAVENOUS

## 2013-11-02 SURGICAL SUPPLY — 46 items
APPLIER CLIP ROT 10 11.4 M/L (STAPLE)
BENZOIN TINCTURE PRP APPL 2/3 (GAUZE/BANDAGES/DRESSINGS) ×3 IMPLANT
BLADE SURG ROTATE 9660 (MISCELLANEOUS) ×3 IMPLANT
CANISTER SUCTION 2500CC (MISCELLANEOUS) ×3 IMPLANT
CHLORAPREP W/TINT 26ML (MISCELLANEOUS) ×3 IMPLANT
CLIP APPLIE ROT 10 11.4 M/L (STAPLE) IMPLANT
COVER SURGICAL LIGHT HANDLE (MISCELLANEOUS) ×3 IMPLANT
CUTTER FLEX LINEAR 45M (STAPLE) ×3 IMPLANT
DRAPE UTILITY 15X26 W/TAPE STR (DRAPE) IMPLANT
DRSG TEGADERM 2-3/8X2-3/4 SM (GAUZE/BANDAGES/DRESSINGS) ×3 IMPLANT
DRSG TEGADERM 4X4.75 (GAUZE/BANDAGES/DRESSINGS) ×3 IMPLANT
ELECT REM PT RETURN 9FT ADLT (ELECTROSURGICAL) ×3
ELECTRODE REM PT RTRN 9FT ADLT (ELECTROSURGICAL) ×1 IMPLANT
ENDOLOOP SUT PDS II  0 18 (SUTURE)
ENDOLOOP SUT PDS II 0 18 (SUTURE) IMPLANT
FILTER SMOKE EVAC LAPAROSHD (FILTER) ×3 IMPLANT
GAUZE SPONGE 2X2 8PLY STRL LF (GAUZE/BANDAGES/DRESSINGS) ×1 IMPLANT
GLOVE BIO SURGEON STRL SZ7 (GLOVE) ×6 IMPLANT
GLOVE BIOGEL PI IND STRL 7.0 (GLOVE) ×2 IMPLANT
GLOVE BIOGEL PI IND STRL 7.5 (GLOVE) ×2 IMPLANT
GLOVE BIOGEL PI IND STRL 8.5 (GLOVE) ×3 IMPLANT
GLOVE BIOGEL PI INDICATOR 7.0 (GLOVE) ×4
GLOVE BIOGEL PI INDICATOR 7.5 (GLOVE) ×4
GLOVE BIOGEL PI INDICATOR 8.5 (GLOVE) ×6
GLOVE SURG SS PI 7.0 STRL IVOR (GLOVE) ×6 IMPLANT
GOWN STRL REUS W/ TWL LRG LVL3 (GOWN DISPOSABLE) ×4 IMPLANT
GOWN STRL REUS W/TWL LRG LVL3 (GOWN DISPOSABLE) ×8
KIT BASIN OR (CUSTOM PROCEDURE TRAY) ×3 IMPLANT
KIT ROOM TURNOVER OR (KITS) ×3 IMPLANT
NS IRRIG 1000ML POUR BTL (IV SOLUTION) ×3 IMPLANT
PAD ARMBOARD 7.5X6 YLW CONV (MISCELLANEOUS) ×6 IMPLANT
POUCH SPECIMEN RETRIEVAL 10MM (ENDOMECHANICALS) ×3 IMPLANT
RELOAD STAPLE TA45 3.5 REG BLU (ENDOMECHANICALS) ×3 IMPLANT
SCALPEL HARMONIC ACE (MISCELLANEOUS) ×3 IMPLANT
SET IRRIG TUBING LAPAROSCOPIC (IRRIGATION / IRRIGATOR) ×3 IMPLANT
SPECIMEN JAR MEDIUM (MISCELLANEOUS) ×3 IMPLANT
SPECIMEN JAR SMALL (MISCELLANEOUS) IMPLANT
SPONGE GAUZE 2X2 STER 10/PKG (GAUZE/BANDAGES/DRESSINGS) ×2
SUT MNCRL AB 4-0 PS2 18 (SUTURE) ×3 IMPLANT
TOWEL OR 17X24 6PK STRL BLUE (TOWEL DISPOSABLE) IMPLANT
TOWEL OR 17X26 10 PK STRL BLUE (TOWEL DISPOSABLE) ×3 IMPLANT
TRAY LAPAROSCOPIC (CUSTOM PROCEDURE TRAY) ×3 IMPLANT
TROCAR XCEL BLADELESS 5X75MML (TROCAR) ×6 IMPLANT
TROCAR XCEL BLUNT TIP 100MML (ENDOMECHANICALS) ×3 IMPLANT
TUBING INSUF HEATED (TUBING) ×3 IMPLANT
TUBING INSUFF HIGH FLOW RTP (TUBING) IMPLANT

## 2013-11-02 NOTE — Progress Notes (Signed)
Patient seen and Examined, agree with Rise Patience, MD H&P  Tachycardic postoperatively  General: Moderately built and nourished. Eyes: Anicteric no pallor. ENT: No discharge from the ears eyes nose or mouth. Neck: No mass felt. Cardiovascular: S1-S2 heard. Respiratory: No rhonchi or crepitations. Abdomen: Right lower quadrant tenderness. Skin: No rash.   Assessment and plan 1. Acute appendicitis and generalized peritonitis -status post surgery this morning. Will switch to Invanz given peritonitis. 2. Tachycardia related to pain, post surgery 3. Hypertension - when necessary hydralazine. Patient is on ARB. Patient will be placed on clear IV hydralazine for systolic blood pressure more than 160. 4. COPD/bronchiectasis - patient is not wheezing at this time and denies any shortness of breath. Chest x-ray pending. 5. History of CVA - no acute issues at this time. 6.

## 2013-11-02 NOTE — ED Notes (Signed)
Dr Wilson at bedside.

## 2013-11-02 NOTE — Transfer of Care (Signed)
Immediate Anesthesia Transfer of Care Note  Patient: [name]  Procedure(s) Performed: Procedure(s): APPENDECTOMY LAPAROSCOPIC (N/A)  Patient Location: PACU  Anesthesia Type:General  Level of Consciousness: awake, alert , oriented and patient cooperative  Airway & Oxygen Therapy: Patient Spontanous Breathing and Patient connected to nasal cannula oxygen  Post-op Assessment: Report given to PACU RN, Post -op Vital signs reviewed and stable and Patient moving all extremities  Post vital signs: Reviewed and stable  Complications: No apparent anesthesia complications

## 2013-11-02 NOTE — Anesthesia Postprocedure Evaluation (Signed)
Anesthesia Post Note  Patient: [name]  Procedure(s) Performed: Procedure(s) (LRB): APPENDECTOMY LAPAROSCOPIC (N/A)  Anesthesia type: general  Patient location: PACU  Post pain: Pain level controlled  Post assessment: Patient's Cardiovascular Status Stable  Last Vitals:  Filed Vitals:   11/02/13 1003  BP: 155/88  Pulse: 109  Temp: 37.2 C  Resp: 21    Post vital signs: Reviewed and stable  Level of consciousness: sedated  Complications: No apparent anesthesia complications

## 2013-11-02 NOTE — H&P (Signed)
[facility] History and Physical  [name] TML:[id number] DOB: [birth year] DOA: 11/01/2013  Referring physician: Dr. Redmond Pulling. Surgeon. PCP: Annye Asa, MD   Chief Complaint: Right lower quadrant pain.  HPI: [name] is a 80 y.o. male with history of COPD/bronchiectasis, CVA, hypertension has been experiencing right lower quadrant pain over the last 2 days. Denies any nausea vomiting diarrhea. In the ER CT abdomen and pelvis shows features consistent with acute appendicitis and on-call surgeon was consulted. At this time patient has been admitted for further management. Patient otherwise denies any chest pain or shortness of breath. In the ER patient's blood pressure was found to be elevated. Patient has not taken his antihypertensives yesterday.   Review of Systems: As presented in the history of presenting illness, rest negative.  Past Medical History  Diagnosis Date  . Osteoporosis   . COPD (chronic obstructive pulmonary disease)   . Coronary artery disease   . Hypertension   . BPH (benign prostatic hyperplasia)   . HLD (hyperlipidemia)   . Pneumonia   . Arthritis     hands   . CVA (cerebral infarction) 3 years ago    mild stroke   Past Surgical History  Procedure Laterality Date  . Back surgery     Social History:  reports that he quit smoking about 25 years ago. His smoking use included Cigarettes. He has a 35 pack-year smoking history. He has never used smokeless tobacco. He reports that he does not drink alcohol or use illicit drugs. Where does patient live  home. Can patient participate in ADLs? Yes.  No Known Allergies  Family History:  Family History  Problem Relation Age of Onset  . Colon cancer Brother       Prior to Admission medications   Medication Sig Start Date End Date Taking? Authorizing Provider  aspirin EC 81 MG tablet Take 81 mg by mouth daily.   Yes Historical Provider, MD  atorvastatin (LIPITOR) 20 MG tablet Take 1 tablet (20  mg total) by mouth daily. 10/17/13  Yes [name], MD  calcium carbonate (OS-CAL) 600 MG TABS tablet Take 600 mg by mouth daily with breakfast.   Yes Historical Provider, MD  docusate sodium (COLACE) 100 MG capsule Take 100 mg by mouth as needed for mild constipation.   Yes Historical Provider, MD  HYDROcodone-acetaminophen (NORCO) 10-325 MG per tablet Take 1 tablet by mouth every 6 (six) hours as needed for moderate pain.    Yes Historical Provider, MD  testosterone (TESTIM) 50 MG/5GM (1%) GEL Place 10 g onto the skin daily. 10/22/13  Yes [name], MD  valsartan (DIOVAN) 80 MG tablet Take 80 mg by mouth daily.   Yes Historical Provider, MD  Vitamin D, Ergocalciferol, (DRISDOL) 50000 UNITS CAPS capsule Take 1 capsule (50,000 Units total) by mouth every 7 (seven) days. 10/17/13  Yes [name], MD    Physical Exam: Filed Vitals:   11/01/13 2045 11/01/13 2330 11/02/13 0030 11/02/13 0100  BP: 160/94 149/94 154/85 169/88  Pulse: 91 90 81 86  Temp: 98.4 F (36.9 C)   97.7 F (36.5 C)  TempSrc: Oral   Oral  Resp: 18  16 14   Height: 5\' 7"  (1.702 m)     Weight: 61.236 kg (135 lb)     SpO2: 95% 94% 93% 95%     General:  Moderately built and nourished.  Eyes: Anicteric no pallor.  ENT: No discharge from the ears eyes nose or mouth.  Neck: No mass felt.  Cardiovascular: S1-S2 heard.  Respiratory: No rhonchi or crepitations.  Abdomen: Right lower quadrant tenderness.  Skin: No rash.  Musculoskeletal: No edema.  Psychiatric: Appears normal.  Neurologic: Alert awake oriented to time place and person. Moves all extremities.  Labs on Admission:  Basic Metabolic Panel:  Recent Labs Lab 11/01/13 2052  NA 137  K 4.3  CL 100  CO2 23  GLUCOSE 105*  BUN 10  CREATININE 0.70  CALCIUM 9.2   Liver Function Tests:  Recent Labs Lab 11/01/13 2052  AST 22  ALT 28  ALKPHOS 94  BILITOT 0.9  PROT 6.6  ALBUMIN 3.5   No results found for this basename:  LIPASE, AMYLASE,  in the last 168 hours No results found for this basename: AMMONIA,  in the last 168 hours CBC:  Recent Labs Lab 11/01/13 2052  WBC 12.2*  NEUTROABS 9.0*  HGB 15.5  HCT 45.3  MCV 90.1  PLT 234   Cardiac Enzymes: No results found for this basename: CKTOTAL, CKMB, CKMBINDEX, TROPONINI,  in the last 168 hours  BNP (last 3 results) No results found for this basename: PROBNP,  in the last 8760 hours CBG: No results found for this basename: GLUCAP,  in the last 168 hours  Radiological Exams on Admission: Ct Abdomen Pelvis W Contrast  11/01/2013   CLINICAL DATA:  Worsening right groin pain.  EXAM: CT ABDOMEN AND PELVIS WITH CONTRAST  TECHNIQUE: Multidetector CT imaging of the abdomen and pelvis was performed using the standard protocol following bolus administration of intravenous contrast.  CONTRAST:  119mL OMNIPAQUE IOHEXOL 300 MG/ML  SOLN  COMPARISON:  None.  FINDINGS: Liver: Tiny sub-cm left hepatic lobe cyst noted. No liver masses identified.  Gallbladder/Biliary: Tiny gallstones seen, without evidence of cholecystitis or biliary dilatation.  Pancreas: No mass, inflammatory changes, or other parenchymal abnormality identified.  Spleen:  Within normal limits in size and appearance.  Adrenal Glands:  No mass identified.  Kidneys/Urinary Tract: No masses identified. No evidence of hydronephrosis. Tiny less than 5 mm nonobstructive calculi noted in the lower pole of the left kidney.  Lymph Nodes:  No pathologically enlarged lymph nodes identified.  Pelvic/Reproductive Organs: Mildly enlarged prostate with mass effect on bladder base.  Bowel/Peritoneum: Dilated appendix seen measuring 15 mm in diameter. Mild periappendiceal inflammatory changes are seen, consistent with acute appendicitis. No evidence of abscess or free fluid.  Vascular:  No evidence of abdominal aortic aneurysm.  Musculoskeletal:  No suspicious bone lesions identified.  Other:  None.  IMPRESSION: Positive for acute  appendicitis. No evidence of abscess or other complication.  Cholelithiasis.  No radiographic evidence of cholecystitis.  Mildly enlarged prostate.   Electronically Signed   By: Earle Gell M.D.   On: 11/01/2013 23:28    EKG: Independently reviewed. Normal sinus rhythm.  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 Acute appendicitis - patient has been kept n.p.o. except medications for anticipated surgery in a.m. Discussed with surgeon Dr. Redmond Pulling. Patient on empiric antibiotics. Further recommendations per surgery. 2. Hypertension - continue home medication. Patient is on ARB. Patient will be placed on clear IV hydralazine for systolic blood pressure more than 160. 3. COPD/bronchiectasis - patient is not wheezing at this time and denies any shortness of breath. Chest x-ray pending. 4. History of CVA - no acute issues at this time.    Code Status: Full code.  Family Communication: Patient's son at the bedside.  Disposition Plan: Admit to inpatient.    Yoshiharu Brassell N. [facility] Pager [phone]. If 7PM-7AM, please contact night-coverage [email or web address] Password [email or web address] 11/02/2013, 1:13 AM

## 2013-11-02 NOTE — Consult Note (Signed)
Reason for Consult:appendicitis Referring Physician: Dr Kristine Linea is an 80 y.o. male.  HPI: 80 yo WM went to UC earlier this bc of worsening RLQ pain. States pain began yesterday, initially all over abdomen then mainly in RLQ. Constant. Denies n/v/f/c. Movement made pain worse. Maybe something a little similar a few months ago and went to ED in [place] and was told it was gas. Denies anorexia. Recently got back from road trip to [place] and [place] -"ribs and gambling". Was treated for several bouts of PNA over past couple of months - most recent was in mid August. Denies any recent wheezing, sob, sputum production. Saw pulmonary recently - ? Of bronchiectasis - scheduled to get PFTs in few weeks. Pt has also been HTN since been in ED. See ROS  Past Medical History  Diagnosis Date  . Osteoporosis   . COPD (chronic obstructive pulmonary disease)   . Coronary artery disease   . Hypertension   . BPH (benign prostatic hyperplasia)   . HLD (hyperlipidemia)   . Pneumonia   . Arthritis     hands   . CVA (cerebral infarction) 3 years ago    mild stroke    Past Surgical History  Procedure Laterality Date  . Back surgery      Family History  Problem Relation Age of Onset  . Colon cancer Brother     Social History:  reports that he quit smoking about 25 years ago. His smoking use included Cigarettes. He has a 35 pack-year smoking history. He has never used smokeless tobacco. He reports that he does not drink alcohol or use illicit drugs.  Allergies: No Known Allergies  Medications: Prior to Admission:  (Not in a hospital admission)  Results for orders placed during the hospital encounter of 11/01/13 (from the past 48 hour(s))  CBC WITH DIFFERENTIAL     Status: Abnormal   Collection Time    11/01/13  8:52 PM      Result Value Ref Range   WBC 12.2 (*) 4.0 - 10.5 K/uL   RBC 5.03  4.22 - 5.81 MIL/uL   Hemoglobin 15.5  13.0 - 17.0 g/dL   HCT 45.3  39.0 - 52.0 %   MCV 90.1   78.0 - 100.0 fL   MCH 30.8  26.0 - 34.0 pg   MCHC 34.2  30.0 - 36.0 g/dL   RDW 13.8  11.5 - 15.5 %   Platelets 234  150 - 400 K/uL   Neutrophils Relative % 74  43 - 77 %   Neutro Abs 9.0 (*) 1.7 - 7.7 K/uL   Lymphocytes Relative 14  12 - 46 %   Lymphs Abs 1.7  0.7 - 4.0 K/uL   Monocytes Relative 10  3 - 12 %   Monocytes Absolute 1.2 (*) 0.1 - 1.0 K/uL   Eosinophils Relative 2  0 - 5 %   Eosinophils Absolute 0.2  0.0 - 0.7 K/uL   Basophils Relative 0  0 - 1 %   Basophils Absolute 0.0  0.0 - 0.1 K/uL  COMPREHENSIVE METABOLIC PANEL     Status: Abnormal   Collection Time    11/01/13  8:52 PM      Result Value Ref Range   Sodium 137  137 - 147 mEq/L   Potassium 4.3  3.7 - 5.3 mEq/L   Chloride 100  96 - 112 mEq/L   CO2 23  19 - 32 mEq/L   Glucose, Bld 105 (*)  70 - 99 mg/dL   BUN 10  6 - 23 mg/dL   Creatinine, Ser 0.70  0.50 - 1.35 mg/dL   Calcium 9.2  8.4 - 10.5 mg/dL   Total Protein 6.6  6.0 - 8.3 g/dL   Albumin 3.5  3.5 - 5.2 g/dL   AST 22  0 - 37 U/L   ALT 28  0 - 53 U/L   Alkaline Phosphatase 94  39 - 117 U/L   Total Bilirubin 0.9  0.3 - 1.2 mg/dL   GFR calc non Af Amer 87 (*) >90 mL/min   GFR calc Af Amer >90  >90 mL/min   Comment: (NOTE)     The eGFR has been calculated using the CKD EPI equation.     This calculation has not been validated in all clinical situations.     eGFR's persistently <90 mL/min signify possible Chronic Kidney     Disease.   Anion gap 14  5 - 15    Ct Abdomen Pelvis W Contrast  11/01/2013   CLINICAL DATA:  Worsening right groin pain.  EXAM: CT ABDOMEN AND PELVIS WITH CONTRAST  TECHNIQUE: Multidetector CT imaging of the abdomen and pelvis was performed using the standard protocol following bolus administration of intravenous contrast.  CONTRAST:  131m OMNIPAQUE IOHEXOL 300 MG/ML  SOLN  COMPARISON:  None.  FINDINGS: Liver: Tiny sub-cm left hepatic lobe cyst noted. No liver masses identified.  Gallbladder/Biliary: Tiny gallstones seen, without  evidence of cholecystitis or biliary dilatation.  Pancreas: No mass, inflammatory changes, or other parenchymal abnormality identified.  Spleen:  Within normal limits in size and appearance.  Adrenal Glands:  No mass identified.  Kidneys/Urinary Tract: No masses identified. No evidence of hydronephrosis. Tiny less than 5 mm nonobstructive calculi noted in the lower pole of the left kidney.  Lymph Nodes:  No pathologically enlarged lymph nodes identified.  Pelvic/Reproductive Organs: Mildly enlarged prostate with mass effect on bladder base.  Bowel/Peritoneum: Dilated appendix seen measuring 15 mm in diameter. Mild periappendiceal inflammatory changes are seen, consistent with acute appendicitis. No evidence of abscess or free fluid.  Vascular:  No evidence of abdominal aortic aneurysm.  Musculoskeletal:  No suspicious bone lesions identified.  Other:  None.  IMPRESSION: Positive for acute appendicitis. No evidence of abscess or other complication.  Cholelithiasis.  No radiographic evidence of cholecystitis.  Mildly enlarged prostate.   Electronically Signed   By: JEarle GellM.D.   On: 11/01/2013 23:28    Review of Systems  Constitutional: Negative for fever, chills, weight loss, malaise/fatigue and diaphoresis.  HENT: Negative for nosebleeds.   Eyes: Negative for blurred vision.  Respiratory: Negative for shortness of breath.        Completed recent course of oral abx for PNA  Cardiovascular: Negative for chest pain, palpitations, orthopnea and PND.       Denies DOE  Gastrointestinal: Positive for abdominal pain. Negative for nausea, vomiting, diarrhea and constipation.  Genitourinary: Negative for dysuria and hematuria.  Musculoskeletal: Positive for back pain.  Skin: Negative for itching and rash.  Neurological: Negative for dizziness, focal weakness, seizures, loss of consciousness and headaches.       Denies TIAs, amaurosis fugax  Endo/Heme/Allergies: Does not bruise/bleed easily.    Psychiatric/Behavioral: The patient is not nervous/anxious.    Blood pressure 149/94, pulse 90, temperature 98.4 F (36.9 C), temperature source Oral, resp. rate 18, height '5\' 7"'  (1.702 m), weight 135 lb (61.236 kg), SpO2 94.00%. Physical Exam  Vitals reviewed. Constitutional:  He is oriented to person, place, and time. He appears well-developed and well-nourished. He is cooperative. No distress.  Elderly WM - nontoxic; resting comfortably  HENT:  Head: Normocephalic and atraumatic.  Right Ear: External ear normal.  Left Ear: External ear normal.  Some temporal wasting  Eyes: Conjunctivae are normal. No scleral icterus.  Neck: Normal range of motion. Neck supple. No tracheal deviation present. No thyromegaly present.  Cardiovascular: Normal rate and normal heart sounds.   Respiratory: Effort normal and breath sounds normal. No stridor. No respiratory distress. He has no wheezes.  GI: Soft. Normal appearance. He exhibits no distension. There is tenderness in the right lower quadrant. There is guarding (voluntary) and tenderness at McBurney's point. There is no rigidity and no rebound.    Musculoskeletal: He exhibits no edema and no tenderness.  Lymphadenopathy:    He has no cervical adenopathy.  Neurological: He is alert and oriented to person, place, and time. He exhibits normal muscle tone.  Skin: Skin is warm and dry. No rash noted. He is not diaphoretic. No erythema. No pallor.  Psychiatric: He has a normal mood and affect. His behavior is normal. Judgment and thought content normal.    Assessment/Plan: Acute appendicitis Uncontrolled HTN COPD with chronic bronchitis ?bronchiectasis CAD H/o CVA  His history, exam, and CT are all c/w acute appendicitis. His nontoxic appearing without signs of sepsis so will IV abx and plan on lap appendectomy in am.  Although his pulmonary status looks stable now, given his uncontrolled HTN I would recommend admission by [facility].   NPO p MN,  can have essential PO meds Check EKG Start IS, pulm toilet now Cont IV abx (flagyl/rocephin)  Appreciate [facility] assistance.   Leighton Ruff. Redmond Pulling, MD, FACS General, Bariatric, & Minimally Invasive Surgery [facility], [place]   [facility] M 11/02/2013, 12:20 AM

## 2013-11-02 NOTE — Anesthesia Preprocedure Evaluation (Addendum)
Anesthesia Evaluation  Patient identified by MRN, date of birth, ID band Patient awake    Reviewed: Allergy & Precautions, H&P , NPO status , Patient's Chart, lab work & pertinent test results, reviewed documented beta blocker date and time   History of Anesthesia Complications Negative for: history of anesthetic complications  Airway Mallampati: II TM Distance: >3 FB Neck ROM: Full    Dental  (+) Missing, Edentulous Upper, Dental Advisory Given,    Pulmonary pneumonia -, resolved, COPDformer smoker,          Cardiovascular hypertension, Pt. on medications + CAD and + Peripheral Vascular Disease Rate:Normal     Neuro/Psych CVA, No Residual Symptoms    GI/Hepatic negative GI ROS, Neg liver ROS,   Endo/Other  negative endocrine ROS  Renal/GU negative Renal ROS     Musculoskeletal  (+) Arthritis -,   Abdominal (+)  Abdomen: soft. Bowel sounds: normal.  Peds  Hematology negative hematology ROS (+)   Anesthesia Other Findings   Reproductive/Obstetrics negative OB ROS                       Anesthesia Physical Anesthesia Plan  ASA: III  Anesthesia Plan: General   Post-op Pain Management:    Induction: Intravenous  Airway Management Planned: Oral ETT  Additional Equipment:   Intra-op Plan:   Post-operative Plan: Extubation in OR  Informed Consent: I have reviewed the patients History and Physical, chart, labs and discussed the procedure including the risks, benefits and alternatives for the proposed anesthesia with the patient or authorized representative who has indicated his/her understanding and acceptance.     Plan Discussed with: CRNA and Surgeon  Anesthesia Plan Comments:         Anesthesia Quick Evaluation

## 2013-11-02 NOTE — Op Note (Signed)
Appendectomy, Lap, Procedure Note  Indications: The patient presented with a history of right-sided abdominal pain. A CT scan revealed findings consistent with acute appendicitis.  Pre-operative Diagnosis: Acute appendicitis with generalized peritonitis  Post-operative Diagnosis: Same  Surgeon: Phill Steck K.   Assistants: none  Anesthesia: General endotracheal anesthesia  ASA Class: 3  Procedure Details  The patient was seen again in the Holding Room. The risks, benefits, complications, treatment options, and expected outcomes were discussed with the patient and/or family. The possibilities of reaction to medication, perforation of viscus, bleeding, recurrent infection, finding a normal appendix, the need for additional procedures, failure to diagnose a condition, and creating a complication requiring transfusion or operation were discussed. There was concurrence with the proposed plan and informed consent was obtained. The site of surgery was properly noted. The patient was taken to Operating Room, identified as Audree Camel Stineman and the procedure verified as Appendectomy. A Time Out was held and the above information confirmed.  The patient was placed in the supine position and general anesthesia was induced.  The abdomen was prepped and draped in a sterile fashion. A one centimeter infraumbilical incision was made.  Dissection was carried down to the fascia bluntly.  The fascia was incised vertically.  We entered the peritoneal cavity bluntly.  A pursestring suture was passed around the incision with a 0 Vicryl.  The Hasson cannula was introduced into the abdomen and the tails of the suture were used to hold the Hasson in place.   The pneumoperitoneum was then established maintaining a maximum pressure of 15 mmHg.  Additional 5 mm cannulas then placed in the left lower quadrant of the abdomen and the right upper quadrant under direct visualization. A careful evaluation of the entire abdomen  was carried out. The patient was placed in Trendelenburg and left lateral decubitus position.  The scope was moved to the right upper quadrant port site. The cecum was mobilized medially.  The appendix was quite inflamed with some fibrinous exudate, but no sign of perforation.  The appendix was carefully dissected. The appendix was was skeletonized with the harmonic scalpel.   The appendix was divided at its base using an endo-GIA stapler.  The appendix was then placed in an Endocatch bag and removed through the umbilical port site. There was no evidence of bleeding, leakage, or complication after division of the appendix. Irrigation was also performed and irrigate suctioned from the abdomen as well.  The umbilical port site was closed with the purse string suture. There was no residual palpable fascial defect.  The trocar site skin wounds were closed with 4-0 Monocryl.  Instrument, sponge, and needle counts were correct at the conclusion of the case.   Findings: The appendix was found to be inflamed. There were not signs of necrosis.  There was not perforation. There was not abscess formation.  Estimated Blood Loss:  Minimal         Drains: none         Specimens: appendix         Complications:  None; patient tolerated the procedure well.         Disposition: PACU - hemodynamically stable.         Condition: stable  Imogene Burn. Georgette Dover, MD, [facility]  General/ Trauma Surgery  11/02/2013 9:33 AM

## 2013-11-03 LAB — GLUCOSE, CAPILLARY: GLUCOSE-CAPILLARY: 86 mg/dL (ref 70–99)

## 2013-11-03 LAB — COMPREHENSIVE METABOLIC PANEL
ALT: 17 U/L (ref 0–53)
AST: 15 U/L (ref 0–37)
Albumin: 2.7 g/dL — ABNORMAL LOW (ref 3.5–5.2)
Alkaline Phosphatase: 73 U/L (ref 39–117)
Anion gap: 13 (ref 5–15)
BILIRUBIN TOTAL: 0.5 mg/dL (ref 0.3–1.2)
BUN: 7 mg/dL (ref 6–23)
CALCIUM: 8.2 mg/dL — AB (ref 8.4–10.5)
CHLORIDE: 104 meq/L (ref 96–112)
CO2: 23 meq/L (ref 19–32)
Creatinine, Ser: 0.65 mg/dL (ref 0.50–1.35)
Glucose, Bld: 90 mg/dL (ref 70–99)
Potassium: 4.1 mEq/L (ref 3.7–5.3)
SODIUM: 140 meq/L (ref 137–147)
Total Protein: 5.3 g/dL — ABNORMAL LOW (ref 6.0–8.3)

## 2013-11-03 LAB — CBC
HCT: 40.8 % (ref 39.0–52.0)
HEMOGLOBIN: 13.5 g/dL (ref 13.0–17.0)
MCH: 30.3 pg (ref 26.0–34.0)
MCHC: 33.1 g/dL (ref 30.0–36.0)
MCV: 91.5 fL (ref 78.0–100.0)
Platelets: 198 10*3/uL (ref 150–400)
RBC: 4.46 MIL/uL (ref 4.22–5.81)
RDW: 13.8 % (ref 11.5–15.5)
WBC: 9.2 10*3/uL (ref 4.0–10.5)

## 2013-11-03 MED ORDER — HYDROCODONE-ACETAMINOPHEN 5-325 MG PO TABS: 1.0000 | ORAL_TABLET | Freq: Four times a day (QID) | ORAL | Status: DC | PRN

## 2013-11-03 NOTE — Discharge Instructions (Signed)
[facility] LAPAROSCOPIC SURGERY: POST OP INSTRUCTIONS  Always review your discharge instruction sheet given to you by the facility where your surgery was performed. IF YOU HAVE DISABILITY OR FAMILY LEAVE FORMS, YOU MUST BRING THEM TO THE OFFICE FOR PROCESSING.   DO NOT GIVE THEM TO YOUR DOCTOR.  1. A prescription for pain medication may be given to you upon discharge.  Take your pain medication as prescribed, if needed.  If narcotic pain medicine is not needed, then you may take acetaminophen (Tylenol), naprosyn (Alleve), or ibuprofen (Advil) as needed. 2. Take your usually prescribed medications unless otherwise directed. 3. If you need a refill on your pain medication, please contact your pharmacy.  They will contact our office to request authorization. Prescriptions will not be filled after 5pm or on week-ends. 4. You should follow a light diet the first few days after arrival home, such as soup and crackers, etc.  Be sure to include lots of fluids daily. 5. Most patients will experience some swelling and bruising in the area of the incisions.  Ice packs will help.  Swelling and bruising can take several days to resolve.  6. It is common to experience some constipation if taking pain medication after surgery.  Increasing fluid intake and taking a stool softener (such as Colace) will usually help or prevent this problem from occurring.  A mild laxative (Milk of Magnesia or Miralax) should be taken according to package instructions if there are no bowel movements after 48 hours. 7. Unless discharge instructions indicate otherwise, you may remove your bandages 48 hours after surgery, and you may shower at that time.  You may have steri-strips (small skin tapes) in place directly over the incision.  These strips should be left on the skin for 7-10 days.  If your surgeon used skin glue on the incision, you may shower in 24 hours.  The glue will flake  off over the next 2-3 weeks.  Any sutures or staples will be removed at the office during your follow-up visit. 8. ACTIVITIES:  You may resume regular (light) daily activities beginning the next day--such as daily self-care, walking, climbing stairs--gradually increasing activities as tolerated.  You may have sexual intercourse when it is comfortable.  Refrain from any heavy lifting or straining until approved by your doctor. a. You may drive when you are no longer taking prescription pain medication, you can comfortably wear a seatbelt, and you can safely maneuver your car and apply brakes. b. RETURN TO WORK:  __________________________________________________________ 9. You should see your doctor in the office for a follow-up appointment approximately 2-3 weeks after your surgery.  Make sure that you call for this appointment within a day or two after you arrive home to insure a convenient appointment time. 10. OTHER INSTRUCTIONS: __________________________________________________________________________________________________________________________ __________________________________________________________________________________________________________________________ WHEN TO CALL YOUR DOCTOR: 1. Fever over 101.0 2. Inability to urinate 3. Continued bleeding from incision. 4. Increased pain, redness, or drainage from the incision. 5. Increasing abdominal pain  The clinic staff is available to answer your questions during regular business hours.  Please don't hesitate to call and ask to speak to one of the nurses for clinical concerns.  If you have a medical emergency, go to the nearest emergency room or call 911.  A surgeon from [facility] is always on call at the hospital. [address], [address], [place], [place]  [address] ? [address] [address], [place], [place]   [address] [phone] ? [phone] ? [phone] Web site: [email or web address]  

## 2013-11-03 NOTE — Progress Notes (Signed)
[facility]S discharge instructions were given and went over with patient. Patient was given prescription for hydrocodone to take to his pharmacy. Patient stated that he did not have any questions. Staff will assist patient to his transportation.

## 2013-11-03 NOTE — Progress Notes (Signed)
1 Day Post-Op  Subjective: Feels great voiding, some flatus, tol clears  Objective: Vital signs in last 24 hours: Temp:  [97.4 F (36.3 C)-98.9 F (37.2 C)] 97.5 F (36.4 C) (09/06 0554) Pulse Rate:  [87-113] 87 (09/06 0554) Resp:  [13-21] 16 (09/06 0554) BP: (112-161)/(69-90) 141/90 mmHg (09/06 0554) SpO2:  [92 %-100 %] 94 % (09/06 0554) Last BM Date: 11/01/13  Intake/Output from previous day: 09/05 0701 - 09/06 0700 In: 3951.6 [P.O.:480; I.V.:3471.6] Out: 940 [Urine:900; Blood:40] Intake/Output this shift: Total I/O In: 240 [P.O.:240] Out: 250 [Urine:250]  General appearance: no distress GI: soft, approp tender dressings dry, bs present  Lab Results:   Recent Labs  11/02/13 0403 11/03/13 0330  WBC 11.4* 9.2  HGB 15.1 13.5  HCT 44.8 40.8  PLT 228 198   BMET  Recent Labs  11/02/13 0403 11/03/13 0330  NA 139 140  K 3.8 4.1  CL 101 104  CO2 24 23  GLUCOSE 96 90  BUN 9 7  CREATININE 0.57 0.65  CALCIUM 8.9 8.2*   PT/INR No results found for this basename: LABPROT, INR,  in the last 72 hours ABG No results found for this basename: PHART, PCO2, PO2, HCO3,  in the last 72 hours  Studies/Results: Ct Abdomen Pelvis W Contrast  11/01/2013   CLINICAL DATA:  Worsening right groin pain.  EXAM: CT ABDOMEN AND PELVIS WITH CONTRAST  TECHNIQUE: Multidetector CT imaging of the abdomen and pelvis was performed using the standard protocol following bolus administration of intravenous contrast.  CONTRAST:  157mL OMNIPAQUE IOHEXOL 300 MG/ML  SOLN  COMPARISON:  None.  FINDINGS: Liver: Tiny sub-cm left hepatic lobe cyst noted. No liver masses identified.  Gallbladder/Biliary: Tiny gallstones seen, without evidence of cholecystitis or biliary dilatation.  Pancreas: No mass, inflammatory changes, or other parenchymal abnormality identified.  Spleen:  Within normal limits in size and appearance.  Adrenal Glands:  No mass identified.  Kidneys/Urinary Tract: No masses identified. No  evidence of hydronephrosis. Tiny less than 5 mm nonobstructive calculi noted in the lower pole of the left kidney.  Lymph Nodes:  No pathologically enlarged lymph nodes identified.  Pelvic/Reproductive Organs: Mildly enlarged prostate with mass effect on bladder base.  Bowel/Peritoneum: Dilated appendix seen measuring 15 mm in diameter. Mild periappendiceal inflammatory changes are seen, consistent with acute appendicitis. No evidence of abscess or free fluid.  Vascular:  No evidence of abdominal aortic aneurysm.  Musculoskeletal:  No suspicious bone lesions identified.  Other:  None.  IMPRESSION: Positive for acute appendicitis. No evidence of abscess or other complication.  Cholelithiasis.  No radiographic evidence of cholecystitis.  Mildly enlarged prostate.   Electronically Signed   By: Earle Gell M.D.   On: 11/01/2013 23:28    Anti-infectives: Anti-infectives   Start     Dose/Rate Route Frequency Ordered Stop   11/02/13 1300  ertapenem (INVANZ) 1 g in sodium chloride 0.9 % 50 mL IVPB     1 g 100 mL/hr over 30 Minutes Intravenous Every 24 hours 11/02/13 1141     11/02/13 0200  cefTRIAXone (ROCEPHIN) 2 g in dextrose 5 % 50 mL IVPB  Status:  Discontinued     2 g 100 mL/hr over 30 Minutes Intravenous Daily at bedtime 11/02/13 0019 11/02/13 1141   11/02/13 0200  metroNIDAZOLE (FLAGYL) IVPB 500 mg  Status:  Discontinued     500 mg 100 mL/hr over 60 Minutes Intravenous Every 8 hours 11/02/13 0019 11/02/13 1141      Assessment/Plan: S/p  lap appy  1. Oral pain control, advance diet as tolerated 2. He had nonperforated appendicitis, 24 hours abx is all he needs, would stop today 3. He can be discharged home from appy standpoing if tol lunch and medically ok, appreciate assistance  [facility] 11/03/2013

## 2013-11-03 NOTE — Discharge Summary (Signed)
Physician Discharge Summary  MAKAR SLATTER MRN: [id number] DOB/AGE: 09/08/1931 80 y.o.  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     atorvastatin 20 MG tablet  Commonly known as:  LIPITOR  Take 1 tablet (20 mg total) by mouth daily.     calcium carbonate 600 MG Tabs tablet  Commonly known as:  OS-CAL  Take 600 mg by mouth daily with breakfast.     docusate sodium 100 MG capsule  Commonly known as:  COLACE  Take 100 mg by mouth as needed for mild constipation.     HYDROcodone-acetaminophen 10-325 MG per tablet  Commonly known as:  NORCO  Take 1 tablet by mouth every 6 (six) hours as needed for moderate pain.     HYDROcodone-acetaminophen 5-325 MG per tablet  Commonly known as:  NORCO/VICODIN  Take 1-2 tablets by mouth every 6 (six) hours as needed for moderate pain.     testosterone 50 MG/5GM (1%) Gel  Commonly known as:  TESTIM  Place 10 g onto the skin daily.     valsartan 80 MG tablet  Commonly known as:  DIOVAN  Take 80 mg by mouth daily.     Vitamin D (Ergocalciferol) 50000 UNITS Caps capsule  Commonly known as:  DRISDOL  Take 1 capsule (50,000 Units total) by mouth every 7 (seven) days.        Discharge Condition: Stable  Disposition: 01-Home or Self Care   Consults:  General surgery   Significant Diagnostic Studies: Dg Chest 2 View  10/08/2013   CLINICAL DATA:  Shortness of breath and chest pain.  EXAM: CHEST  2 VIEW  COMPARISON:  None.  FINDINGS: Mild coarsening of the pulmonary interstitium is identified. No consolidative process, pneumothorax or effusion is seen. Heart size is normal. Multiple thoracic compression fracture deformities are identified. The  patient is status post vertebral augmentation in the mid and lower thoracic spine and upper lumbar spine.  IMPRESSION: No acute disease.   Electronically Signed   By: Inge Rise M.D.   On: 10/08/2013 11:16   Ct Abdomen Pelvis W Contrast  11/01/2013   CLINICAL DATA:  Worsening right groin pain.  EXAM: CT ABDOMEN AND PELVIS WITH CONTRAST  TECHNIQUE: Multidetector CT imaging of the abdomen and pelvis was performed using the standard protocol following bolus administration of intravenous contrast.  CONTRAST:  116m OMNIPAQUE IOHEXOL 300 MG/ML  SOLN  COMPARISON:  None.  FINDINGS: Liver: Tiny sub-cm left hepatic lobe cyst noted. No liver masses identified.  Gallbladder/Biliary: Tiny gallstones seen, without evidence of cholecystitis or biliary dilatation.  Pancreas: No mass, inflammatory changes, or other parenchymal abnormality identified.  Spleen:  Within normal limits in size and appearance.  Adrenal Glands:  No mass identified.  Kidneys/Urinary Tract: No masses identified. No evidence of hydronephrosis. Tiny less than 5 mm nonobstructive calculi noted in the lower pole of the left kidney.  Lymph Nodes:  No pathologically enlarged lymph nodes identified.  Pelvic/Reproductive Organs: Mildly enlarged prostate with mass effect on bladder base.  Bowel/Peritoneum: Dilated appendix seen measuring 15 mm in diameter. Mild periappendiceal inflammatory changes are seen, consistent with acute appendicitis. No evidence of abscess  or free fluid.  Vascular:  No evidence of abdominal aortic aneurysm.  Musculoskeletal:  No suspicious bone lesions identified.  Other:  None.  IMPRESSION: Positive for acute appendicitis. No evidence of abscess or other complication.  Cholelithiasis.  No radiographic evidence of cholecystitis.  Mildly enlarged prostate.   Electronically Signed   By: Earle Gell M.D.   On: 11/01/2013 23:28   Next    Microbiology: Recent Results (from the past 240 hour(s))  SURGICAL PCR SCREEN     Status:  None   Collection Time    11/02/13  2:53 AM      Result Value Ref Range Status   MRSA, PCR NEGATIVE  NEGATIVE Final   Staphylococcus aureus NEGATIVE  NEGATIVE Final   Comment:            The Xpert SA Assay (FDA     approved for NASAL specimens     in patients over 58 years of age),     is one component of     a comprehensive surveillance     program.  Test performance has     been validated by [employer] for patients greater     than or equal to 51 year old.     It is not intended     to diagnose infection nor to     guide or monitor treatment.     Labs: Results for orders placed during the hospital encounter of 11/01/13 (from the past 48 hour(s))  CBC WITH DIFFERENTIAL     Status: Abnormal   Collection Time    11/01/13  8:52 PM      Result Value Ref Range   WBC 12.2 (*) 4.0 - 10.5 K/uL   RBC 5.03  4.22 - 5.81 MIL/uL   Hemoglobin 15.5  13.0 - 17.0 g/dL   HCT 45.3  39.0 - 52.0 %   MCV 90.1  78.0 - 100.0 fL   MCH 30.8  26.0 - 34.0 pg   MCHC 34.2  30.0 - 36.0 g/dL   RDW 13.8  11.5 - 15.5 %   Platelets 234  150 - 400 K/uL   Neutrophils Relative % 74  43 - 77 %   Neutro Abs 9.0 (*) 1.7 - 7.7 K/uL   Lymphocytes Relative 14  12 - 46 %   Lymphs Abs 1.7  0.7 - 4.0 K/uL   Monocytes Relative 10  3 - 12 %   Monocytes Absolute 1.2 (*) 0.1 - 1.0 K/uL   Eosinophils Relative 2  0 - 5 %   Eosinophils Absolute 0.2  0.0 - 0.7 K/uL   Basophils Relative 0  0 - 1 %   Basophils Absolute 0.0  0.0 - 0.1 K/uL  COMPREHENSIVE METABOLIC PANEL     Status: Abnormal   Collection Time    11/01/13  8:52 PM      Result Value Ref Range   Sodium 137  137 - 147 mEq/L   Potassium 4.3  3.7 - 5.3 mEq/L   Chloride 100  96 - 112 mEq/L   CO2 23  19 - 32 mEq/L   Glucose, Bld 105 (*) 70 - 99 mg/dL   BUN 10  6 - 23 mg/dL   Creatinine, Ser 0.70  0.50 - 1.35 mg/dL   Calcium 9.2  8.4 - 10.5 mg/dL   Total Protein 6.6  6.0 - 8.3 g/dL   Albumin 3.5  3.5 - 5.2 g/dL   AST 22  0 - 37 U/L   ALT 28  0 - 53  U/L   Alkaline Phosphatase 94  39 - 117 U/L   Total Bilirubin 0.9  0.3 - 1.2 mg/dL   GFR calc non Af Amer 87 (*) >90 mL/min   GFR calc Af Amer >90  >90 mL/min   Comment: (NOTE)     The eGFR has been calculated using the CKD EPI equation.     This calculation has not been validated in all clinical situations.     eGFR's persistently <90 mL/min signify possible Chronic Kidney     Disease.   Anion gap 14  5 - 15  SURGICAL PCR SCREEN     Status: None   Collection Time    11/02/13  2:53 AM      Result Value Ref Range   MRSA, PCR NEGATIVE  NEGATIVE   Staphylococcus aureus NEGATIVE  NEGATIVE   Comment:            The Xpert SA Assay (FDA     approved for NASAL specimens     in patients over 93 years of age),     is one component of     a comprehensive surveillance     program.  Test performance has     been validated by [employer] for patients greater     than or equal to 86 year old.     It is not intended     to diagnose infection nor to     guide or monitor treatment.  COMPREHENSIVE METABOLIC PANEL     Status: Abnormal   Collection Time    11/02/13  4:03 AM      Result Value Ref Range   Sodium 139  137 - 147 mEq/L   Potassium 3.8  3.7 - 5.3 mEq/L   Chloride 101  96 - 112 mEq/L   CO2 24  19 - 32 mEq/L   Glucose, Bld 96  70 - 99 mg/dL   BUN 9  6 - 23 mg/dL   Creatinine, Ser 0.57  0.50 - 1.35 mg/dL   Calcium 8.9  8.4 - 10.5 mg/dL   Total Protein 6.1  6.0 - 8.3 g/dL   Albumin 3.1 (*) 3.5 - 5.2 g/dL   AST 17  0 - 37 U/L   ALT 24  0 - 53 U/L   Alkaline Phosphatase 85  39 - 117 U/L   Total Bilirubin 0.4  0.3 - 1.2 mg/dL   GFR calc non Af Amer >90  >90 mL/min   GFR calc Af Amer >90  >90 mL/min   Comment: (NOTE)     The eGFR has been calculated using the CKD EPI equation.     This calculation has not been validated in all clinical situations.     eGFR's persistently <90 mL/min signify possible Chronic Kidney     Disease.   Anion gap 14  5 - 15  CBC WITH DIFFERENTIAL      Status: Abnormal   Collection Time    11/02/13  4:03 AM      Result Value Ref Range   WBC 11.4 (*) 4.0 - 10.5 K/uL   RBC 4.99  4.22 - 5.81 MIL/uL   Hemoglobin 15.1  13.0 - 17.0 g/dL   HCT 44.8  39.0 - 52.0 %   MCV 89.8  78.0 - 100.0 fL   MCH 30.3  26.0 - 34.0 pg   MCHC 33.7  30.0 - 36.0 g/dL   RDW 13.8  11.5 - 15.5 %   Platelets 228  150 - 400 K/uL   Neutrophils Relative % 74  43 - 77 %   Neutro Abs 8.4 (*) 1.7 - 7.7 K/uL   Lymphocytes Relative 12  12 - 46 %   Lymphs Abs 1.4  0.7 - 4.0 K/uL   Monocytes Relative 12  3 - 12 %   Monocytes Absolute 1.3 (*) 0.1 - 1.0 K/uL   Eosinophils Relative 2  0 - 5 %   Eosinophils Absolute 0.3  0.0 - 0.7 K/uL   Basophils Relative 0  0 - 1 %   Basophils Absolute 0.0  0.0 - 0.1 K/uL  GLUCOSE, CAPILLARY     Status: None   Collection Time    11/02/13  5:48 AM      Result Value Ref Range   Glucose-Capillary 91  70 - 99 mg/dL  GLUCOSE, CAPILLARY     Status: Abnormal   Collection Time    11/02/13 11:29 AM      Result Value Ref Range   Glucose-Capillary 109 (*) 70 - 99 mg/dL   Comment 1 Notify RN    GLUCOSE, CAPILLARY     Status: None   Collection Time    11/02/13  5:11 PM      Result Value Ref Range   Glucose-Capillary 99  70 - 99 mg/dL   Comment 1 Notify RN    GLUCOSE, CAPILLARY     Status: Abnormal   Collection Time    11/02/13 11:48 PM      Result Value Ref Range   Glucose-Capillary 101 (*) 70 - 99 mg/dL   Comment 1 Notify RN     Comment 2 Documented in Chart    CBC     Status: None   Collection Time    11/03/13  3:30 AM      Result Value Ref Range   WBC 9.2  4.0 - 10.5 K/uL   RBC 4.46  4.22 - 5.81 MIL/uL   Hemoglobin 13.5  13.0 - 17.0 g/dL   HCT 40.8  39.0 - 52.0 %   MCV 91.5  78.0 - 100.0 fL   MCH 30.3  26.0 - 34.0 pg   MCHC 33.1  30.0 - 36.0 g/dL   RDW 13.8  11.5 - 15.5 %   Platelets 198  150 - 400 K/uL  COMPREHENSIVE METABOLIC PANEL     Status: Abnormal   Collection Time    11/03/13  3:30 AM      Result Value Ref Range    Sodium 140  137 - 147 mEq/L   Potassium 4.1  3.7 - 5.3 mEq/L   Chloride 104  96 - 112 mEq/L   CO2 23  19 - 32 mEq/L   Glucose, Bld 90  70 - 99 mg/dL   BUN 7  6 - 23 mg/dL   Creatinine, Ser 0.65  0.50 - 1.35 mg/dL   Calcium 8.2 (*) 8.4 - 10.5 mg/dL   Total Protein 5.3 (*) 6.0 - 8.3 g/dL   Albumin 2.7 (*) 3.5 - 5.2 g/dL   AST 15  0 - 37 U/L   ALT 17  0 - 53 U/L   Alkaline Phosphatase 73  39 - 117 U/L   Total Bilirubin 0.5  0.3 - 1.2 mg/dL   GFR calc non Af Amer >90  >90 mL/min   GFR calc Af Amer >90  >90 mL/min  Comment: (NOTE)     The eGFR has been calculated using the CKD EPI equation.     This calculation has not been validated in all clinical situations.     eGFR's persistently <90 mL/min signify possible Chronic Kidney     Disease.   Anion gap 13  5 - 15  GLUCOSE, CAPILLARY     Status: None   Collection Time    11/03/13  7:33 AM      Result Value Ref Range   Glucose-Capillary 86  70 - 99 mg/dL   Comment 1 Notify RN     Comment 2 Documented in Chart       HPI :80 yo WM went to UC earlier this bc of worsening RLQ pain. States pain began yesterday, initially all over abdomen then mainly in RLQ. Constant. Denies n/v/f/c. Movement made pain worse. Maybe something a little similar a few months ago and went to ED in [place] and was told it was gas. Denies anorexia. Recently got back from road trip to [place] and [place] -"ribs and gambling". Was treated for several bouts of PNA over past couple of months - most recent was in mid August. Denies any recent wheezing, sob, sputum production. Saw pulmonary recently - ? Of bronchiectasis - scheduled to get PFTs in few weeks.  In the ER CT abdomen and pelvis shows features consistent with acute appendicitis and on-call surgeon was consulted. At this time patient has been admitted for further management. Patient otherwise denies any chest pain or shortness of breath. In the ER patient's blood pressure was found to be elevated. Patient has  not taken his antihypertensives yesterday.    HOSPITAL COURSE: Abdominal pain secondary to acute appendicitis Status post laparoscopic appendectomy on 9/5 Patient's pain is under control Advancing diet as tolerated Discussed with Dr. Elenor Legato indication for antibiotics, the patient did not have peritonitis Anticipate discharge home today if the patient tolerates diet Vicodin for pain control Followup with surgery as scheduled   Discharge Exam:  Blood pressure 157/88, pulse 90, temperature 98.1 F (36.7 C), temperature source Oral, resp. rate 16, height '5\' 7"'  (1.702 m), weight 61.236 kg (135 lb), SpO2 95.00%.  General appearance: no distress  GI: soft, approp tender dressings dry, bs present  Eyes: Anicteric no pallor. ENT: No discharge from the ears eyes nose or mouth. Neck: No mass felt. Cardiovascular: S1-S2 heard. Respiratory: No rhonchi or crepitations. A  Skin: No rash.          Discharge Instructions   Diet - low sodium heart healthy    Complete by:  As directed      Diet - low sodium heart healthy    Complete by:  As directed   Mechanical soft     Increase activity slowly    Complete by:  As directed      Increase activity slowly    Complete by:  As directed            Follow-up Information   Follow up with [facility] On 11/26/2013. (our office will call you)    Contact information:   [address] [place] [address] [phone]       Follow up with Annye Asa, MD. Schedule an appointment as soon as possible for a visit in 1 week.   Specialty:  Family Medicine   Contact information:   [address] [place] [address] [phone]  SignedReyne Dumas 11/03/2013, 11:26 AM

## 2013-11-05 ENCOUNTER — Other Ambulatory Visit (INDEPENDENT_AMBULATORY_CARE_PROVIDER_SITE_OTHER): Payer: Medicare Other

## 2013-11-05 ENCOUNTER — Encounter (HOSPITAL_COMMUNITY): Payer: Self-pay | Admitting: Surgery

## 2013-11-05 DIAGNOSIS — J189 Pneumonia, unspecified organism: Secondary | ICD-10-CM

## 2013-11-05 LAB — CBC WITH DIFFERENTIAL/PLATELET
BASOS PCT: 0.3 % (ref 0.0–3.0)
Basophils Absolute: 0 10*3/uL (ref 0.0–0.1)
EOS PCT: 3.6 % (ref 0.0–5.0)
Eosinophils Absolute: 0.4 10*3/uL (ref 0.0–0.7)
HCT: 47.6 % (ref 39.0–52.0)
Hemoglobin: 15.7 g/dL (ref 13.0–17.0)
LYMPHS PCT: 19 % (ref 12.0–46.0)
Lymphs Abs: 2 10*3/uL (ref 0.7–4.0)
MCHC: 32.9 g/dL (ref 30.0–36.0)
MCV: 91.4 fl (ref 78.0–100.0)
MONOS PCT: 8.3 % (ref 3.0–12.0)
Monocytes Absolute: 0.9 10*3/uL (ref 0.1–1.0)
Neutro Abs: 7.4 10*3/uL (ref 1.4–7.7)
Neutrophils Relative %: 68.8 % (ref 43.0–77.0)
PLATELETS: 231 10*3/uL (ref 150.0–400.0)
RBC: 5.21 Mil/uL (ref 4.22–5.81)
RDW: 13.9 % (ref 11.5–15.5)
WBC: 10.8 10*3/uL — AB (ref 4.0–10.5)

## 2013-11-06 ENCOUNTER — Other Ambulatory Visit: Payer: Self-pay | Admitting: Family Medicine

## 2013-11-06 DIAGNOSIS — D72829 Elevated white blood cell count, unspecified: Secondary | ICD-10-CM

## 2013-11-12 ENCOUNTER — Encounter: Payer: Self-pay | Admitting: General Practice

## 2013-11-13 ENCOUNTER — Ambulatory Visit: Payer: Medicare Other | Admitting: Endocrinology

## 2013-11-20 ENCOUNTER — Other Ambulatory Visit (INDEPENDENT_AMBULATORY_CARE_PROVIDER_SITE_OTHER): Payer: Medicare Other

## 2013-11-20 DIAGNOSIS — D72829 Elevated white blood cell count, unspecified: Secondary | ICD-10-CM | POA: Diagnosis not present

## 2013-11-20 DIAGNOSIS — Z79899 Other long term (current) drug therapy: Secondary | ICD-10-CM | POA: Diagnosis not present

## 2013-11-20 LAB — CBC WITH DIFFERENTIAL/PLATELET
BASOS ABS: 0 10*3/uL (ref 0.0–0.1)
Basophils Relative: 0 % (ref 0.0–3.0)
EOS ABS: 0.2 10*3/uL (ref 0.0–0.7)
Eosinophils Relative: 1.3 % (ref 0.0–5.0)
HCT: 45.6 % (ref 39.0–52.0)
Hemoglobin: 15 g/dL (ref 13.0–17.0)
LYMPHS ABS: 1.6 10*3/uL (ref 0.7–4.0)
LYMPHS PCT: 8.7 % — AB (ref 12.0–46.0)
MCHC: 32.8 g/dL (ref 30.0–36.0)
MCV: 90.4 fl (ref 78.0–100.0)
MONOS PCT: 4.5 % (ref 3.0–12.0)
Monocytes Absolute: 0.8 10*3/uL (ref 0.1–1.0)
Neutro Abs: 15.3 10*3/uL — ABNORMAL HIGH (ref 1.4–7.7)
Neutrophils Relative %: 85.5 % — ABNORMAL HIGH (ref 43.0–77.0)
PLATELETS: 327 10*3/uL (ref 150.0–400.0)
RBC: 5.04 Mil/uL (ref 4.22–5.81)
RDW: 14.2 % (ref 11.5–15.5)
WBC: 17.9 10*3/uL — ABNORMAL HIGH (ref 4.0–10.5)

## 2013-11-22 ENCOUNTER — Encounter: Payer: Self-pay | Admitting: Family Medicine

## 2013-11-22 ENCOUNTER — Ambulatory Visit (HOSPITAL_BASED_OUTPATIENT_CLINIC_OR_DEPARTMENT_OTHER)
Admission: RE | Admit: 2013-11-22 | Discharge: 2013-11-22 | Disposition: A | Discharge status: Home / Self Care | Payer: Medicare Other | Source: Ambulatory Visit | Attending: Family Medicine | Admitting: Family Medicine

## 2013-11-22 ENCOUNTER — Ambulatory Visit (INDEPENDENT_AMBULATORY_CARE_PROVIDER_SITE_OTHER): Payer: Medicare Other | Admitting: Family Medicine

## 2013-11-22 VITALS — BP 140/90 | HR 82 | Temp 97.9°F | Resp 17 | Wt 137.1 lb

## 2013-11-22 DIAGNOSIS — D72829 Elevated white blood cell count, unspecified: Secondary | ICD-10-CM | POA: Insufficient documentation

## 2013-11-22 DIAGNOSIS — R9389 Abnormal findings on diagnostic imaging of other specified body structures: Secondary | ICD-10-CM | POA: Diagnosis not present

## 2013-11-22 DIAGNOSIS — R319 Hematuria, unspecified: Secondary | ICD-10-CM

## 2013-11-22 DIAGNOSIS — I6529 Occlusion and stenosis of unspecified carotid artery: Secondary | ICD-10-CM | POA: Diagnosis not present

## 2013-11-22 LAB — POCT URINALYSIS DIPSTICK
Bilirubin, UA: NEGATIVE
GLUCOSE UA: NEGATIVE
Ketones, UA: NEGATIVE
LEUKOCYTES UA: NEGATIVE
NITRITE UA: NEGATIVE
PH UA: 7
Protein, UA: NEGATIVE
Spec Grav, UA: 1.01
Urobilinogen, UA: 0.2

## 2013-11-22 LAB — CBC WITH DIFFERENTIAL/PLATELET
BASOS PCT: 0.2 % (ref 0.0–3.0)
Basophils Absolute: 0 10*3/uL (ref 0.0–0.1)
EOS PCT: 1.7 % (ref 0.0–5.0)
Eosinophils Absolute: 0.2 10*3/uL (ref 0.0–0.7)
HCT: 46.4 % (ref 39.0–52.0)
Hemoglobin: 15.3 g/dL (ref 13.0–17.0)
LYMPHS ABS: 1.9 10*3/uL (ref 0.7–4.0)
Lymphocytes Relative: 16.3 % (ref 12.0–46.0)
MCHC: 32.9 g/dL (ref 30.0–36.0)
MCV: 89.6 fl (ref 78.0–100.0)
MONO ABS: 0.8 10*3/uL (ref 0.1–1.0)
Monocytes Relative: 7.3 % (ref 3.0–12.0)
NEUTROS ABS: 8.6 10*3/uL — AB (ref 1.4–7.7)
Neutrophils Relative %: 74.5 % (ref 43.0–77.0)
PLATELETS: 270 10*3/uL (ref 150.0–400.0)
RBC: 5.18 Mil/uL (ref 4.22–5.81)
RDW: 14 % (ref 11.5–15.5)
WBC: 11.6 10*3/uL — ABNORMAL HIGH (ref 4.0–10.5)

## 2013-11-22 MED ORDER — NONFORMULARY OR COMPOUNDED ITEM: Status: DC

## 2013-11-22 NOTE — Progress Notes (Signed)
Pre visit review using our clinic review tool, if applicable. No additional management support is needed unless otherwise documented below in the visit note. 

## 2013-11-22 NOTE — Patient Instructions (Signed)
Go downstairs and get your chest xray after your labs We'll notify you of your lab results and make any changes if needed If you have any fevers, chills, or other concerns, please go to the ER Call with any questions or concerns Hang in there!  We'll figure this out!

## 2013-11-22 NOTE — Progress Notes (Signed)
   Subjective:    Patient ID: [name], male    DOB: December 20, 1931, 80 y.o.   MRN: [id number]  HPI Elevated WBC- on 9/6 pt's WBC was 9.2 it climbed to 10.8 on 9/8 and on 9/23 it was 17.9.  Pt reports feeling weak after appendectomy but otherwise feeling 'ok'.  Had diarrhea for 3-4 days last week and that has since resolved w/ pepto.  Pt denies abd pain.  No cough.  Pt had subjective fevers during diarrhea but this resolved.  No burning w/ urination.     Review of Systems For ROS see HPI     Objective:   Physical Exam  Vitals reviewed. Constitutional: He is oriented to person, place, and time. He appears well-developed. No distress.  Thin, elderly man but not ill appearing  HENT:  Head: Normocephalic and atraumatic.  Mouth/Throat: Oropharynx is clear and moist.  TMs WNL bilaterally No TTP over sinuses  Neck: Normal range of motion. Neck supple.  Cardiovascular: Normal rate, regular rhythm and normal heart sounds.   Pulmonary/Chest: Effort normal and breath sounds normal. No respiratory distress. He has no wheezes. He has no rales.  Abdominal: Soft. Bowel sounds are normal. He exhibits no distension. There is no tenderness. There is no rebound.  Lymphadenopathy:    He has no cervical adenopathy.  Neurological: He is alert and oriented to person, place, and time.  Skin: Skin is warm and dry.  Well healing abdominal incisions w/o evidence of infxn  Psychiatric: He has a normal mood and affect.          Assessment & Plan:

## 2013-11-23 NOTE — Assessment & Plan Note (Signed)
Pt's recent WBCs were very high.  Pt is asymptomatic and reports feeling well.  Repeat CBC, get CXR, blood culture and urine culture.  Treat as needed.  Reviewed supportive care and red flags that should prompt return.  Pt expressed understanding and is in agreement w/ plan.

## 2013-11-24 LAB — URINE CULTURE

## 2013-11-27 ENCOUNTER — Encounter: Payer: Self-pay | Admitting: Endocrinology

## 2013-11-27 ENCOUNTER — Ambulatory Visit (INDEPENDENT_AMBULATORY_CARE_PROVIDER_SITE_OTHER): Payer: Medicare Other | Admitting: Endocrinology

## 2013-11-27 VITALS — BP 142/94 | HR 104 | Temp 97.8°F | Ht 67.0 in | Wt 138.0 lb

## 2013-11-27 DIAGNOSIS — Z23 Encounter for immunization: Secondary | ICD-10-CM

## 2013-11-27 DIAGNOSIS — M810 Age-related osteoporosis without current pathological fracture: Secondary | ICD-10-CM | POA: Diagnosis not present

## 2013-11-27 DIAGNOSIS — I6529 Occlusion and stenosis of unspecified carotid artery: Secondary | ICD-10-CM

## 2013-11-27 NOTE — Progress Notes (Signed)
Subjective:    Patient ID: [name], male    DOB: 1931/08/29, 80 y.o.   MRN: [id number]  HPI Pt reports he had puberty at the normal age.  He has 3 biological children.  Hypogonadism was dx'ed in approx 2009, near [place], [place].  Etiology is uncertain.  He says he has never taken illicit androgens.  He has been on prescribed medication for hypogonadism since then (androgel and (most recently), topical compounded testosterone.  He denies any h/o infertility.  He has never had surgery, or a serious injury to the head or genital area.  He has slightly decreased urinary stream, but no assoc urinary retention.  Other than vit-D, he has never been on prescribed rx for osteoporosis.   Past Medical History  Diagnosis Date  . Osteoporosis   . COPD (chronic obstructive pulmonary disease)   . Coronary artery disease   . Hypertension   . BPH (benign prostatic hyperplasia)   . HLD (hyperlipidemia)   . Pneumonia   . Arthritis     hands   . CVA (cerebral infarction) 3 years ago    mild stroke    Past Surgical History  Procedure Laterality Date  . Back surgery    . Laparoscopic appendectomy N/A 11/02/2013    Procedure: APPENDECTOMY LAPAROSCOPIC;  Surgeon: Donnie Mesa, MD;  Location: [facility];  Service: General;  Laterality: N/A;    History   Social History  . Marital Status: Widowed    Spouse Name: N/A    Number of Children: N/A  . Years of Education: N/A   Occupational History  . Retired    Social History Main Topics  . Smoking status: Former Smoker -- 1.00 packs/day for 35 years    Types: Cigarettes    Quit date: 02/29/1988  . Smokeless tobacco: Never Used  . Alcohol Use: No  . Drug Use: No  . Sexual Activity: Not on file   Other Topics Concern  . Not on file   Social History Narrative  . No narrative on file    Current Outpatient Prescriptions on File Prior to Visit  Medication Sig Dispense Refill  . aspirin EC 81 MG tablet Take 81 mg by mouth daily.      [name] atorvastatin  (LIPITOR) 20 MG tablet Take 1 tablet (20 mg total) by mouth daily.  90 tablet  3  . calcium carbonate (OS-CAL) 600 MG TABS tablet Take 600 mg by mouth daily with breakfast.      . docusate sodium (COLACE) 100 MG capsule Take 100 mg by mouth as needed for mild constipation.      [name] HYDROcodone-acetaminophen (NORCO/VICODIN) 5-325 MG per tablet Take 1-2 tablets by mouth every 6 (six) hours as needed for moderate pain.  45 tablet  0  . NONFORMULARY OR COMPOUNDED ITEM Testosterone 1% in VBC. Apply 7ml to skin every day.  30 each  3  . valsartan (DIOVAN) 80 MG tablet Take 80 mg by mouth daily.      . Vitamin D, Ergocalciferol, (DRISDOL) 50000 UNITS CAPS capsule Take 1 capsule (50,000 Units total) by mouth every 7 (seven) days.  12 capsule  0   No current facility-administered medications on file prior to visit.    No Known Allergies  Family History  Problem Relation Age of Onset  . Colon cancer Brother   .     son has low testosterone, uncertain etiology  BP 142/94  Pulse 104  Temp(Src) 97.8 F (36.6 C) (Oral)  Ht  5\' 7"  (1.702 m)  Wt 138 lb (62.596 kg)  BMI 21.61 kg/m2  SpO2 94%    Review of Systems denies weight change, headache, fever, diarrhea, rash, visual loss, abdominal pain, sob, depression, urinary frequency, arthralgias, gynecomastia, cramps, excessive diaphoresis, cold intolerance, muscle weakness, rhinorrhea, heartburn, and numbness.  He has easy bruising.     Objective:   Physical Exam VS: see vs page GEN: no distress HEAD: head: no deformity eyes: no periorbital swelling, no proptosis external nose and ears are normal mouth: no lesion seen NECK: supple, thyroid is not enlarged CHEST WALL: no deformity.  Kyphosis is noted.   LUNGS: clear to auscultation BREASTS:  No gynecomastia CV: reg rate and rhythm, no murmur ABD: abdomen is soft, nontender.  no hepatosplenomegaly.  not distended.  no hernia GENITALIA:  Normal male, except testes are small and  soft. MUSCULOSKELETAL: muscle bulk and strength are grossly normal.  no obvious joint swelling.  gait is normal and steady.   EXTEMITIES: no deformity.  no edema.   PULSES: no carotid bruit.   NEURO:  cn 2-12 grossly intact.   readily moves all 4's.  sensation is intact to touch on all 4's.  SKIN:  Normal texture and temperature.  No rash or suspicious lesion is visible.   NODES:  None palpable at the neck PSYCH: alert, well-oriented.  Does not appear anxious nor depressed.  Lab Results  Component Value Date   TESTOSTERONE 197* 10/15/2013   Lab Results  Component Value Date   CALCIUM 8.2* 11/03/2013   25-OH vit-D=32  Radiol: I reviewed CXR report from 11/22/13.  i have reviewed the following outside records: Office notes    Assessment & Plan:  Hypogonadism, new to me: asymptomatic. uncertain etiology. Osteoporosis: uncertain to what extent the hypogonadism contributes to this. BPH: would like to avoid rx of hypogonadism in this context if possible.     Patient is advised the following: Patient Instructions  Please hold-off on the testosterone cream for now. Let's check a bone-density test. Based on the results, i'll prescribe a once-a-week pill for it. it is critically important to prevent falling down (keep floor areas well-lit, dry, and free of loose objects.  If you have a cane, walker, or wheelchair, you should use it, even for short trips around the house.  Also, try not to rush).

## 2013-11-27 NOTE — Patient Instructions (Addendum)
Please hold-off on the testosterone cream for now. Let's check a bone-density test. Based on the results, i'll prescribe a once-a-week pill for it. it is critically important to prevent falling down (keep floor areas well-lit, dry, and free of loose objects.  If you have a cane, walker, or wheelchair, you should use it, even for short trips around the house.  Also, try not to rush).

## 2013-11-28 LAB — CULTURE, BLOOD (SINGLE): Organism ID, Bacteria: NO GROWTH

## 2013-12-03 ENCOUNTER — Other Ambulatory Visit: Payer: Self-pay | Admitting: Internal Medicine

## 2013-12-03 DIAGNOSIS — R0600 Dyspnea, unspecified: Secondary | ICD-10-CM

## 2013-12-03 NOTE — Telephone Encounter (Signed)
Insurance states Testim does not require a PA. Not sure why pharmacy is saying this. JG//CMA

## 2013-12-04 ENCOUNTER — Encounter: Payer: Self-pay | Admitting: Internal Medicine

## 2013-12-04 ENCOUNTER — Ambulatory Visit (INDEPENDENT_AMBULATORY_CARE_PROVIDER_SITE_OTHER): Payer: Medicare Other | Admitting: Internal Medicine

## 2013-12-04 ENCOUNTER — Ambulatory Visit (INDEPENDENT_AMBULATORY_CARE_PROVIDER_SITE_OTHER)
Admission: RE | Admit: 2013-12-04 | Discharge: 2013-12-04 | Disposition: A | Discharge status: Home / Self Care | Payer: Medicare Other | Source: Ambulatory Visit | Attending: Endocrinology | Admitting: Endocrinology

## 2013-12-04 VITALS — BP 110/70 | HR 100 | Temp 97.8°F | Ht 68.0 in | Wt 138.0 lb

## 2013-12-04 DIAGNOSIS — J189 Pneumonia, unspecified organism: Secondary | ICD-10-CM

## 2013-12-04 DIAGNOSIS — I6521 Occlusion and stenosis of right carotid artery: Secondary | ICD-10-CM

## 2013-12-04 DIAGNOSIS — J984 Other disorders of lung: Secondary | ICD-10-CM

## 2013-12-04 DIAGNOSIS — R0600 Dyspnea, unspecified: Secondary | ICD-10-CM | POA: Diagnosis not present

## 2013-12-04 DIAGNOSIS — J449 Chronic obstructive pulmonary disease, unspecified: Secondary | ICD-10-CM | POA: Insufficient documentation

## 2013-12-04 DIAGNOSIS — M810 Age-related osteoporosis without current pathological fracture: Secondary | ICD-10-CM

## 2013-12-04 LAB — PULMONARY FUNCTION TEST
DL/VA % pred: 74 %
DL/VA: 3.33 ml/min/mmHg/L
DLCO unc % pred: 44 %
DLCO unc: 13.27 ml/min/mmHg
FEF 25-75 PRE: 1.84 L/s
FEF 25-75 Post: 1.63 L/sec
FEF2575-%Change-Post: -11 %
FEF2575-%PRED-POST: 90 %
FEF2575-%Pred-Pre: 102 %
FEV1-%Change-Post: -3 %
FEV1-%PRED-PRE: 78 %
FEV1-%Pred-Post: 75 %
FEV1-POST: 1.96 L
FEV1-PRE: 2.04 L
FEV1FVC-%CHANGE-POST: -5 %
FEV1FVC-%Pred-Pre: 112 %
FEV6-%Change-Post: -1 %
FEV6-%PRED-POST: 72 %
FEV6-%Pred-Pre: 73 %
FEV6-Post: 2.5 L
FEV6-Pre: 2.52 L
FEV6FVC-%Change-Post: -1 %
FEV6FVC-%PRED-PRE: 107 %
FEV6FVC-%Pred-Post: 105 %
FVC-%Change-Post: 1 %
FVC-%Pred-Post: 69 %
FVC-%Pred-Pre: 68 %
FVC-Post: 2.57 L
FVC-Pre: 2.52 L
POST FEV1/FVC RATIO: 76 %
Post FEV6/FVC ratio: 98 %
Pre FEV1/FVC ratio: 81 %
Pre FEV6/FVC Ratio: 100 %
RV % pred: 61 %
RV: 1.57 L
TLC % pred: 69 %
TLC: 4.63 L

## 2013-12-04 NOTE — Patient Instructions (Addendum)
Return here as needed for cough or short of breath > see me or Tammy NP

## 2013-12-04 NOTE — Assessment & Plan Note (Signed)
See pfts 12/04/2013 c/w thoracic kyphosis with dlco correcting back to 77%  Presently no cough or sob so doubt this is clinically relevant though might reduce his reserve in the event of pneumonia, already received prevnar so nothing else to do for now  Will see back prn

## 2013-12-04 NOTE — Progress Notes (Signed)
PFT done today. 

## 2013-12-04 NOTE — Progress Notes (Signed)
Subjective:    Patient ID: [name], male    DOB: December 29, 1931 MRN: [id number]    Brief patient profile:  81 yowm quit smoking around 1990 with multiple pna's ? RLL all dx/rx  [name] last one Jan 2015  And fully recovered and moved to [facility] summer 2015 cough/ sob ? Fever > UC > Dr [name] referred 10/23/2013 to pulmonary clinic for ? Reason for recurrent pna?    History of Present Illness  10/23/2013 1st [facility] office visit/ [name]  Chief Complaint  Patient presents with  . Pulmonary Consult    Referred by Dr. [name] for eval of COPD. Pt states "getting over pneumonia"- c/o occ prod cough with minimal clear sputum.    baseline able to fxn normally in [place] including walking up inclines s limiting sob after each bout of "pna". No recent dental work though does have poor dentition, h/o cva 3 y prior to [place]  But no dysphagia. Minimal am cough now - carries dx of copd but never used inhalers  No h/o asbestos exp/ rheumatologic dx/chemo/ amiodarone exp cxr with dx of pna was nl  rec Prevnar 13   12/04/2013 f/u ov/[name] re: "recurrent pna" not seen on most recent espisode's cxr  Chief Complaint  Patient presents with  . Follow-up    PFT done today. Pt reports his cough has resolved and his breathing is doing well. No new co's today.      Not limited by breathing from desired activities   No resp meds   No obvious day to day or daytime variabilty or assoc chronic cough or cp or chest tightness, subjective wheeze overt sinus or hb symptoms. No unusual exp hx or h/o childhood pna/ asthma or knowledge of premature birth.  Sleeping ok without nocturnal  or early am exacerbation  of respiratory  c/o's or need for noct saba. Also denies any obvious fluctuation of symptoms with weather or environmental changes or other aggravating or alleviating factors except as outlined above   Current Medications, Allergies, Complete Past Medical History, Past Surgical History, Family History, and Social  History were reviewed in [employer] record.  ROS  The following are not active complaints unless bolded sore throat, dysphagia, dental problems, itching, sneezing,  nasal congestion or excess/ purulent secretions, ear ache,   fever, chills, sweats, unintended wt loss, pleuritic or exertional cp, hemoptysis,  orthopnea pnd or leg swelling, presyncope, palpitations, heartburn, abdominal pain, anorexia, nausea, vomiting, diarrhea  or change in bowel or urinary habits, change in stools or urine, dysuria,hematuria,  rash, arthralgias, visual complaints, headache, numbness weakness or ataxia or problems with walking or coordination,  change in mood/affect or memory.                   Objective:   Physical Exam  Wt Readings from Last 3 Encounters:  10/23/13 139 lb 12.8 oz (63.413 kg)  10/15/13 136 lb 6.4 oz (61.871 kg)      HEENT: nl dentition, turbinates, and orophanx. Nl external ear canals without cough reflex   NECK :  without JVD/Nodes/TM/ nl carotid upstrokes bilaterally   LUNGS: no acc muscle use, Very min Bilateral  insp crackles s cough on insp  CV:  RRR  no s3 or murmur or increase in P2, no edema   ABD:  soft and nontender with nl excursion in the supine position. No bruits or organomegaly, bowel sounds nl  MS:  warm without deformities, calf tenderness, cyanosis -  No  clubbing  SKIN: warm and dry without lesions    NEURO:  alert, approp, no deficits    11/22/13 Cardiomediastinal silhouette is stable. No acute infiltrate or  pulmonary edema. Stable prior vertebroplasty and chronic compression  deformities thoracolumbar spine (my review: very striking kyphotic deformity)      Assessment & Plan:

## 2013-12-04 NOTE — Assessment & Plan Note (Signed)
10/08/13  Levaquin 500 Qday x 10 days rx > acute symptoms resolved  prevnar 13 given 10/23/2013  - 12/04/2013 restrictive changes moderate with dlco 44 corrects to 74%  Not clear what these episodes are but the cough is gone and there is no as dz either on today's film or the one done when he reports he was told he had pna > offered to see him back with next flare but no resp rx needed at this point

## 2013-12-06 ENCOUNTER — Other Ambulatory Visit: Payer: Self-pay | Admitting: Endocrinology

## 2013-12-06 DIAGNOSIS — M810 Age-related osteoporosis without current pathological fracture: Secondary | ICD-10-CM

## 2013-12-06 MED ORDER — ALENDRONATE SODIUM 70 MG PO TABS: 70.0000 mg | ORAL_TABLET | ORAL | Status: DC

## 2013-12-09 ENCOUNTER — Other Ambulatory Visit: Payer: Medicare Other

## 2013-12-09 ENCOUNTER — Encounter: Payer: Self-pay | Admitting: Family Medicine

## 2013-12-10 ENCOUNTER — Other Ambulatory Visit: Payer: Medicare Other

## 2013-12-10 DIAGNOSIS — M810 Age-related osteoporosis without current pathological fracture: Secondary | ICD-10-CM | POA: Diagnosis not present

## 2013-12-11 LAB — PTH, INTACT AND CALCIUM
Calcium: 9.6 mg/dL (ref 8.4–10.5)
PTH: 71 pg/mL — ABNORMAL HIGH (ref 14–64)

## 2014-01-06 ENCOUNTER — Telehealth: Payer: Self-pay | Admitting: Endocrinology

## 2014-01-06 ENCOUNTER — Ambulatory Visit (HOSPITAL_BASED_OUTPATIENT_CLINIC_OR_DEPARTMENT_OTHER)
Admission: RE | Admit: 2014-01-06 | Discharge: 2014-01-06 | Disposition: A | Discharge status: Home / Self Care | Payer: Medicare Other | Source: Ambulatory Visit | Attending: Family Medicine | Admitting: Family Medicine

## 2014-01-06 ENCOUNTER — Ambulatory Visit (INDEPENDENT_AMBULATORY_CARE_PROVIDER_SITE_OTHER): Payer: Medicare Other | Admitting: Family Medicine

## 2014-01-06 ENCOUNTER — Encounter: Payer: Self-pay | Admitting: Family Medicine

## 2014-01-06 VITALS — BP 140/80 | HR 96 | Temp 98.0°F | Resp 16 | Wt 138.1 lb

## 2014-01-06 DIAGNOSIS — I6521 Occlusion and stenosis of right carotid artery: Secondary | ICD-10-CM

## 2014-01-06 DIAGNOSIS — R509 Fever, unspecified: Secondary | ICD-10-CM | POA: Diagnosis not present

## 2014-01-06 DIAGNOSIS — J189 Pneumonia, unspecified organism: Secondary | ICD-10-CM | POA: Insufficient documentation

## 2014-01-06 DIAGNOSIS — R05 Cough: Secondary | ICD-10-CM | POA: Diagnosis not present

## 2014-01-06 MED ORDER — ALBUTEROL SULFATE (2.5 MG/3ML) 0.083% IN NEBU
2.5000 mg | INHALATION_SOLUTION | Freq: Once | RESPIRATORY_TRACT | Status: AC
  Administered 2014-01-06: 2.5 mg via RESPIRATORY_TRACT

## 2014-01-06 MED ORDER — AMOXICILLIN 875 MG PO TABS: 875.0000 mg | ORAL_TABLET | Freq: Two times a day (BID) | ORAL | Status: DC

## 2014-01-06 MED ORDER — BENZONATATE 200 MG PO CAPS: 200.0000 mg | ORAL_CAPSULE | Freq: Three times a day (TID) | ORAL | Status: DC | PRN

## 2014-01-06 NOTE — Progress Notes (Signed)
   Subjective:    Patient ID: [name], male    DOB: 12-06-1931, 80 y.o.   MRN: [id number]  HPI Cough- pt developed cough ~1 week ago.  + subjective fever.  Cough is productive.  Hx of PNA.  No known sick contacts.  Pt saw Pulmonary on 10/7 and was told that lungs were clear at that time and no need for maintenance inhalers.  No pain w/ cough.  + sinus pain.  No ear pain.  + SOB.   Review of Systems For ROS see HPI     Objective:   Physical Exam  Constitutional: He appears well-developed and well-nourished. No distress.  HENT:  Head: Normocephalic and atraumatic.  Right Ear: Tympanic membrane normal.  Left Ear: Tympanic membrane normal.  Nose: No mucosal edema or rhinorrhea. Right sinus exhibits no maxillary sinus tenderness and no frontal sinus tenderness. Left sinus exhibits no maxillary sinus tenderness and no frontal sinus tenderness.  Mouth/Throat: Mucous membranes are normal. No oropharyngeal exudate, posterior oropharyngeal edema or posterior oropharyngeal erythema.  Eyes: Conjunctivae and EOM are normal. Pupils are equal, round, and reactive to light.  Neck: Normal range of motion. Neck supple.  Cardiovascular: Normal rate, regular rhythm and normal heart sounds.   Pulmonary/Chest: Effort normal. No respiratory distress. He has wheezes (diffuse expiratory wheezes).  + hacking cough Coarse BS L>R, possible crackles  Lymphadenopathy:    He has no cervical adenopathy.  Skin: Skin is warm and dry.  Vitals reviewed.         Assessment & Plan:

## 2014-01-06 NOTE — Patient Instructions (Signed)
Follow up in 3 months to recheck BP and cholesterol We'll notify you of your chest xray and make any changes if needed Start the Amoxicillin twice daily- take w/ food Use the cough pills as needed Drink plenty of fluids REST! Call with any questions or concerns Hang in there!!!

## 2014-01-06 NOTE — Telephone Encounter (Signed)
please call patient: i spoke with Dr [name]: 1.  Please d/c ergocalciferol, as you are finished with this. 2.  Please return in 1 year.

## 2014-01-06 NOTE — Progress Notes (Signed)
Pre visit review using our clinic review tool, if applicable. No additional management support is needed unless otherwise documented below in the visit note. 

## 2014-01-06 NOTE — Telephone Encounter (Signed)
[name] on pt's contact list advised of note below and states she will inform pt.

## 2014-01-07 NOTE — Assessment & Plan Note (Signed)
Pt's current cough, reported fevers, and increased SOB are consistent w/ previous episodes of PNA.  Start abx and get CXR for confirmation due to hx.  Cough meds prn.  Reviewed supportive care and red flags that should prompt return.  Pt expressed understanding and is in agreement w/ plan.

## 2014-01-13 ENCOUNTER — Other Ambulatory Visit: Payer: Self-pay | Admitting: Family Medicine

## 2014-01-14 NOTE — Telephone Encounter (Signed)
Med filled.  

## 2014-01-16 ENCOUNTER — Telehealth: Payer: Self-pay | Admitting: Family Medicine

## 2014-01-16 MED ORDER — VALSARTAN 80 MG PO TABS: 80.0000 mg | ORAL_TABLET | Freq: Every day | ORAL | Status: DC

## 2014-01-16 NOTE — Telephone Encounter (Signed)
Med filled.  

## 2014-01-16 NOTE — Telephone Encounter (Signed)
Caller name: Nixon  Relation to pt: Call back number: [phone] Pharmacy: [facility]        [place], [place]   Reason for call: Pt came in office requesting for rx  valsartan (DIOVAN) 80 MG tablet . Pt states has only one pill left today 01-16-14. Please advice. Thank you

## 2014-04-08 ENCOUNTER — Encounter: Payer: Self-pay | Admitting: Family Medicine

## 2014-04-08 ENCOUNTER — Ambulatory Visit (INDEPENDENT_AMBULATORY_CARE_PROVIDER_SITE_OTHER): Payer: Medicare Other | Admitting: Family Medicine

## 2014-04-08 VITALS — BP 140/78 | HR 79 | Temp 97.7°F | Resp 16 | Wt 147.5 lb

## 2014-04-08 DIAGNOSIS — I10 Essential (primary) hypertension: Secondary | ICD-10-CM

## 2014-04-08 DIAGNOSIS — E785 Hyperlipidemia, unspecified: Secondary | ICD-10-CM | POA: Diagnosis not present

## 2014-04-08 DIAGNOSIS — L989 Disorder of the skin and subcutaneous tissue, unspecified: Secondary | ICD-10-CM | POA: Diagnosis not present

## 2014-04-08 LAB — CBC WITH DIFFERENTIAL/PLATELET
BASOS PCT: 0.4 % (ref 0.0–3.0)
Basophils Absolute: 0 10*3/uL (ref 0.0–0.1)
EOS ABS: 0.4 10*3/uL (ref 0.0–0.7)
Eosinophils Relative: 3.1 % (ref 0.0–5.0)
HCT: 47.3 % (ref 39.0–52.0)
HEMOGLOBIN: 15.8 g/dL (ref 13.0–17.0)
Lymphocytes Relative: 17.5 % (ref 12.0–46.0)
Lymphs Abs: 2.3 10*3/uL (ref 0.7–4.0)
MCHC: 33.5 g/dL (ref 30.0–36.0)
MCV: 88.6 fl (ref 78.0–100.0)
MONO ABS: 0.8 10*3/uL (ref 0.1–1.0)
MONOS PCT: 6.2 % (ref 3.0–12.0)
NEUTROS PCT: 72.8 % (ref 43.0–77.0)
Neutro Abs: 9.7 10*3/uL — ABNORMAL HIGH (ref 1.4–7.7)
Platelets: 207 10*3/uL (ref 150.0–400.0)
RBC: 5.33 Mil/uL (ref 4.22–5.81)
RDW: 15 % (ref 11.5–15.5)
WBC: 13.3 10*3/uL — ABNORMAL HIGH (ref 4.0–10.5)

## 2014-04-08 LAB — HEPATIC FUNCTION PANEL
ALBUMIN: 4.1 g/dL (ref 3.5–5.2)
ALT: 38 U/L (ref 0–53)
AST: 22 U/L (ref 0–37)
Alkaline Phosphatase: 86 U/L (ref 39–117)
BILIRUBIN TOTAL: 0.7 mg/dL (ref 0.2–1.2)
Bilirubin, Direct: 0.2 mg/dL (ref 0.0–0.3)
TOTAL PROTEIN: 6.5 g/dL (ref 6.0–8.3)

## 2014-04-08 LAB — BASIC METABOLIC PANEL
BUN: 17 mg/dL (ref 6–23)
CO2: 30 meq/L (ref 19–32)
Calcium: 9.3 mg/dL (ref 8.4–10.5)
Chloride: 105 mEq/L (ref 96–112)
Creatinine, Ser: 0.82 mg/dL (ref 0.40–1.50)
GFR: 95.98 mL/min (ref >60.00)
GLUCOSE: 94 mg/dL (ref 70–99)
Potassium: 4.7 mEq/L (ref 3.5–5.1)
Sodium: 140 mEq/L (ref 135–145)

## 2014-04-08 LAB — LIPID PANEL
Cholesterol: 117 mg/dL (ref 0–200)
HDL: 49.5 mg/dL (ref >39.00)
LDL CALC: 58 mg/dL (ref 0–99)
NonHDL: 67.5
TRIGLYCERIDES: 48 mg/dL (ref 0.0–149.0)
Total CHOL/HDL Ratio: 2
VLDL: 9.6 mg/dL (ref 0.0–40.0)

## 2014-04-08 NOTE — Assessment & Plan Note (Signed)
Chronic problem.  BP is adequately controlled on current medication.  Asymptomatic at this time.  Check labs.  No anticipated med changes.

## 2014-04-08 NOTE — Progress Notes (Signed)
   Subjective:    Patient ID: [name], male    DOB: 06/24/1931, 81 y.o.   MRN: [id number]  HPI HTN- chronic problem, on Diovan 80 mg daily.  Good compliance w/ meds.  No CP, SOB, HAs, visual changes, edema.  Hyperlipidemia- chronic problem, on Lipitor daily.  Good compliance w/ meds.  No abd pain, N/V, myalgias.  Facial lesion- pt has large mole on L cheek.  He feels that recently this has enlarged.  No color change.  Some flaking and peeling at inferior margin   Review of Systems For ROS see HPI   Reviewed meds, allergies, problem list, and PMH in chart     Objective:   Physical Exam  Constitutional: He is oriented to person, place, and time. He appears well-developed and well-nourished. No distress.  HENT:  Head: Normocephalic and atraumatic.  Eyes: Conjunctivae and EOM are normal. Pupils are equal, round, and reactive to light.  Neck: Normal range of motion. Neck supple. No thyromegaly present.  Cardiovascular: Normal rate, regular rhythm, normal heart sounds and intact distal pulses.   No murmur heard. Pulmonary/Chest: Effort normal and breath sounds normal. No respiratory distress.  Abdominal: Soft. Bowel sounds are normal. He exhibits no distension.  Musculoskeletal: He exhibits no edema.  Lymphadenopathy:    He has no cervical adenopathy.  Neurological: He is alert and oriented to person, place, and time. No cranial nerve deficit.  Skin: Skin is warm and dry.  Hyperpigmented lesion on L cheek w/ some flaking and peeling at inferior margin  Psychiatric: He has a normal mood and affect. His behavior is normal.  Vitals reviewed.         Assessment & Plan:

## 2014-04-08 NOTE — Patient Instructions (Signed)
Schedule your complete physical in 6 months We'll notify you of your lab results and make any changes if needed Keep up the good work!  You look great! We'll call you with your Dermatology appt Call with any questions or concerns Happy Valentine's Day!!!

## 2014-04-08 NOTE — Progress Notes (Signed)
Pre visit review using our clinic review tool, if applicable. No additional management support is needed unless otherwise documented below in the visit note. 

## 2014-04-08 NOTE — Assessment & Plan Note (Signed)
New.  Pt reports area is growing in size.  Based on this, and location, will refer to derm for evaluation and tx.  Pt expressed understanding and is in agreement w/ plan.

## 2014-04-08 NOTE — Assessment & Plan Note (Signed)
Chronic problem.  Tolerating statin w/o difficulty.  Check labs.  Adjust meds prn  

## 2014-04-09 ENCOUNTER — Other Ambulatory Visit: Payer: Self-pay | Admitting: Family Medicine

## 2014-04-09 DIAGNOSIS — D72829 Elevated white blood cell count, unspecified: Secondary | ICD-10-CM

## 2014-04-23 ENCOUNTER — Telehealth: Payer: Self-pay | Admitting: Family Medicine

## 2014-04-23 ENCOUNTER — Other Ambulatory Visit: Payer: Self-pay | Admitting: Family Medicine

## 2014-04-23 ENCOUNTER — Other Ambulatory Visit (INDEPENDENT_AMBULATORY_CARE_PROVIDER_SITE_OTHER): Payer: Medicare Other

## 2014-04-23 DIAGNOSIS — D72829 Elevated white blood cell count, unspecified: Secondary | ICD-10-CM | POA: Diagnosis not present

## 2014-04-23 LAB — CBC WITH DIFFERENTIAL/PLATELET
Basophils Absolute: 0 K/uL (ref 0.0–0.1)
Basophils Relative: 0.2 % (ref 0.0–3.0)
Eosinophils Absolute: 0.4 K/uL (ref 0.0–0.7)
Eosinophils Relative: 2.8 % (ref 0.0–5.0)
HCT: 47.4 % (ref 39.0–52.0)
Hemoglobin: 15.8 g/dL (ref 13.0–17.0)
Lymphocytes Relative: 24.2 % (ref 12.0–46.0)
Lymphs Abs: 3.4 K/uL (ref 0.7–4.0)
MCHC: 33.4 g/dL (ref 30.0–36.0)
MCV: 89 fl (ref 78.0–100.0)
Monocytes Absolute: 0.8 K/uL (ref 0.1–1.0)
Monocytes Relative: 5.8 % (ref 3.0–12.0)
Neutro Abs: 9.3 K/uL — ABNORMAL HIGH (ref 1.4–7.7)
Neutrophils Relative %: 67 % (ref 43.0–77.0)
Platelets: 253 K/uL (ref 150.0–400.0)
RBC: 5.33 Mil/uL (ref 4.22–5.81)
RDW: 14.9 % (ref 11.5–15.5)
WBC: 13.9 K/uL — ABNORMAL HIGH (ref 4.0–10.5)

## 2014-04-23 MED ORDER — VALSARTAN 80 MG PO TABS: 80.0000 mg | ORAL_TABLET | Freq: Every day | ORAL | Status: DC

## 2014-04-23 NOTE — Telephone Encounter (Signed)
valsartin 80 mg tablets   [facility] [phone]

## 2014-04-23 NOTE — Telephone Encounter (Signed)
Med filled.  

## 2014-05-02 DIAGNOSIS — L814 Other melanin hyperpigmentation: Secondary | ICD-10-CM | POA: Diagnosis not present

## 2014-05-02 DIAGNOSIS — L821 Other seborrheic keratosis: Secondary | ICD-10-CM | POA: Diagnosis not present

## 2014-05-02 DIAGNOSIS — L219 Seborrheic dermatitis, unspecified: Secondary | ICD-10-CM | POA: Diagnosis not present

## 2014-05-02 DIAGNOSIS — L570 Actinic keratosis: Secondary | ICD-10-CM | POA: Diagnosis not present

## 2014-05-27 ENCOUNTER — Other Ambulatory Visit (INDEPENDENT_AMBULATORY_CARE_PROVIDER_SITE_OTHER): Payer: Medicare Other

## 2014-05-27 DIAGNOSIS — D72829 Elevated white blood cell count, unspecified: Secondary | ICD-10-CM

## 2014-05-27 LAB — CBC WITH DIFFERENTIAL/PLATELET
BASOS ABS: 0.1 10*3/uL (ref 0.0–0.1)
Basophils Relative: 0.4 % (ref 0.0–3.0)
EOS ABS: 0.3 10*3/uL (ref 0.0–0.7)
Eosinophils Relative: 2.3 % (ref 0.0–5.0)
HEMATOCRIT: 46.7 % (ref 39.0–52.0)
HEMOGLOBIN: 15.5 g/dL (ref 13.0–17.0)
LYMPHS ABS: 2.8 10*3/uL (ref 0.7–4.0)
Lymphocytes Relative: 19.1 % (ref 12.0–46.0)
MCHC: 33.2 g/dL (ref 30.0–36.0)
MCV: 90.1 fl (ref 78.0–100.0)
MONOS PCT: 5 % (ref 3.0–12.0)
Monocytes Absolute: 0.7 10*3/uL (ref 0.1–1.0)
Neutro Abs: 10.9 10*3/uL — ABNORMAL HIGH (ref 1.4–7.7)
Neutrophils Relative %: 73.2 % (ref 43.0–77.0)
Platelets: 223 10*3/uL (ref 150.0–400.0)
RBC: 5.18 Mil/uL (ref 4.22–5.81)
RDW: 15.4 % (ref 11.5–15.5)
WBC: 14.9 10*3/uL — ABNORMAL HIGH (ref 4.0–10.5)

## 2014-05-29 NOTE — Progress Notes (Signed)
Called pt and was advised that the voicemail was not set up yet. Will call pt again later.

## 2014-05-30 ENCOUNTER — Other Ambulatory Visit: Payer: Self-pay | Admitting: Family Medicine

## 2014-05-30 DIAGNOSIS — D72829 Elevated white blood cell count, unspecified: Secondary | ICD-10-CM

## 2014-06-16 ENCOUNTER — Telehealth: Payer: Self-pay | Admitting: Hematology & Oncology

## 2014-06-16 NOTE — Telephone Encounter (Signed)
I spoke w NEW PATIENT dau n law Clarise Cruz)  today to remind them of their appointment with Dr. Marin Olp. Also, advised them to bring all medication bottles and insurance card information.

## 2014-06-17 ENCOUNTER — Ambulatory Visit: Payer: Medicare Other

## 2014-06-17 ENCOUNTER — Other Ambulatory Visit (HOSPITAL_BASED_OUTPATIENT_CLINIC_OR_DEPARTMENT_OTHER): Payer: Medicare Other

## 2014-06-17 ENCOUNTER — Other Ambulatory Visit: Payer: Self-pay | Admitting: Family

## 2014-06-17 ENCOUNTER — Encounter: Payer: Self-pay | Admitting: Family

## 2014-06-17 ENCOUNTER — Ambulatory Visit (HOSPITAL_BASED_OUTPATIENT_CLINIC_OR_DEPARTMENT_OTHER): Payer: Medicare Other | Admitting: Family

## 2014-06-17 VITALS — BP 158/93 | HR 103 | Temp 98.0°F | Resp 16 | Wt 147.0 lb

## 2014-06-17 DIAGNOSIS — D72829 Elevated white blood cell count, unspecified: Secondary | ICD-10-CM

## 2014-06-17 DIAGNOSIS — Z800 Family history of malignant neoplasm of digestive organs: Secondary | ICD-10-CM

## 2014-06-17 LAB — CBC WITH DIFFERENTIAL (CANCER CENTER ONLY)
BASO#: 0.1 10*3/uL (ref 0.0–0.2)
BASO%: 0.4 % (ref 0.0–2.0)
EOS%: 2.8 % (ref 0.0–7.0)
Eosinophils Absolute: 0.4 10*3/uL (ref 0.0–0.5)
HEMATOCRIT: 47 % (ref 38.7–49.9)
HGB: 15.5 g/dL (ref 13.0–17.1)
LYMPH#: 1.4 10*3/uL (ref 0.9–3.3)
LYMPH%: 10.3 % — ABNORMAL LOW (ref 14.0–48.0)
MCH: 30.2 pg (ref 28.0–33.4)
MCHC: 33 g/dL (ref 32.0–35.9)
MCV: 91 fL (ref 82–98)
MONO#: 1.2 10*3/uL — ABNORMAL HIGH (ref 0.1–0.9)
MONO%: 8.9 % (ref 0.0–13.0)
NEUT#: 10.4 10*3/uL — ABNORMAL HIGH (ref 1.5–6.5)
NEUT%: 77.6 % (ref 40.0–80.0)
Platelets: 294 10*3/uL (ref 145–400)
RBC: 5.14 10*6/uL (ref 4.20–5.70)
RDW: 14.9 % (ref 11.1–15.7)
WBC: 13.4 10*3/uL — AB (ref 4.0–10.0)

## 2014-06-17 LAB — CHCC SATELLITE - SMEAR

## 2014-06-17 NOTE — Progress Notes (Signed)
Hematology/Oncology Consultation   Name: [name]      MRN: [id number]    Location: Room/bed info not found  Date: 06/17/2014 Time:9:58 AM   REFERRING PHYSICIAN: Aundra Millet. [name], MD  REASON FOR CONSULT: Elevated WBCs   DIAGNOSIS: Leukocytosis  HISTORY OF PRESENT ILLNESS: Mr. [name] of a very pleasant 81 yo male with an 8 month history of elevated WBC counts. His WBC count today is 13.4. He is asymptomatic with this. He has had no issue with infections. He denies fever, chills, n/v, cough, rash, dizziness, chest pain, palpitations, abdominal pain, constipation, diarrhea, blood in urine or stool. No lymphadenopathy. He has some SOB with exertion due to COPD.  No swelling, tenderness, numbness or tingling in his extremities. He has osteoporosis and also some rheumatoid arthritis in his hands that bother him at times. No new aches or pain.  He has a good appetite and stays hydrated. No significant weight loss or gain.  He does not smoke or drink alcohol.  He had appendicitis 3 months ago and had his appendix removed without complication.  He has no cancer history. His brother had stomach cancer. No family history of elevated WBC counts.  He has 3 children all of whom are healthy.  He is a widower and now lives with his son.  He retired from the [employer] after [place]. He does not think he had any exposure to [employer]. 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    ROS: All other 10 point review of systems is negative.   PAST MEDICAL HISTORY:   Past Medical History  Diagnosis Date  . Osteoporosis   . COPD (chronic obstructive pulmonary disease)   . Coronary artery disease   . Hypertension   . BPH (benign prostatic hyperplasia)   . HLD (hyperlipidemia)   . Pneumonia   . Arthritis     hands   . CVA (cerebral infarction) 3 years ago    mild stroke    ALLERGIES: No Known Allergies    MEDICATIONS:  Current Outpatient Prescriptions on File Prior to  Visit  Medication Sig Dispense Refill  . alendronate (FOSAMAX) 70 MG tablet Take 1 tablet (70 mg total) by mouth every 7 (seven) days. Take with a full glass of water on an empty stomach. 4 tablet 11  . aspirin EC 81 MG tablet Take 81 mg by mouth daily.    [name] atorvastatin (LIPITOR) 20 MG tablet Take 1 tablet (20 mg total) by mouth daily. 90 tablet 3  . calcium carbonate (OS-CAL) 600 MG TABS tablet Take 600 mg by mouth daily with breakfast.    . docusate sodium (COLACE) 100 MG capsule Take 100 mg by mouth as needed for mild constipation.    [name] HYDROcodone-acetaminophen (NORCO/VICODIN) 5-325 MG per tablet Take 1-2 tablets by mouth every 6 (six) hours as needed for moderate pain. 45 tablet 0  . valsartan (DIOVAN) 80 MG tablet Take 1 tablet (80 mg total) by mouth daily. 90 tablet 1   No current facility-administered medications on file prior to visit.     PAST SURGICAL HISTORY Past Surgical History  Procedure Laterality Date  . Back surgery    . Laparoscopic appendectomy N/A 11/02/2013    Procedure: APPENDECTOMY LAPAROSCOPIC;  Surgeon: Donnie Mesa, MD;  Location: [facility];  Service: General;  Laterality: N/A;    FAMILY HISTORY: Family History  Problem Relation Age of Onset  . Colon cancer Brother   .  SOCIAL HISTORY:  reports that he quit smoking about 26 years ago. His smoking use included Cigarettes. He has a 35 pack-year smoking history. He has never used smokeless tobacco. He reports that he does not drink alcohol or use illicit drugs.  PERFORMANCE STATUS: The patient's performance status is 0 - Asymptomatic  PHYSICAL EXAM: Most Recent Vital Signs: Blood pressure 158/93, pulse 103, temperature 98 F (36.7 C), temperature source Oral, resp. rate 16, weight 147 lb (66.679 kg). BP 158/93 mmHg  Pulse 103  Temp(Src) 98 F (36.7 C) (Oral)  Resp 16  Wt 147 lb (66.679 kg)  General Appearance:    Alert, cooperative, no distress, appears stated age  Head:    Normocephalic, without  obvious abnormality, atraumatic  Eyes:    PERRL, conjunctiva/corneas clear, EOM's intact, fundi    benign, both eyes             Throat:   Lips, mucosa, and tongue normal; teeth and gums normal  Neck:   Supple, symmetrical, trachea midline, no adenopathy;       thyroid:  No enlargement/tenderness/nodules; no carotid   bruit or JVD  Back:     Symmetric, no curvature, ROM normal, no CVA tenderness  Lungs:     Clear to auscultation bilaterally, respirations unlabored  Chest wall:    No tenderness or deformity  Heart:    Regular rate and rhythm, S1 and S2 normal, no murmur, rub   or gallop  Abdomen:     Soft, non-tender, bowel sounds active all four quadrants,    no masses, no organomegaly        Extremities:   Extremities normal, atraumatic, no cyanosis or edema  Pulses:   2+ and symmetric all extremities  Skin:   Skin color, texture, turgor normal, no rashes or lesions  Lymph nodes:   Cervical, supraclavicular, and axillary nodes normal  Neurologic:   CNII-XII intact. Normal strength, sensation and reflexes      throughout   LABORATORY DATA:  Results for orders placed or performed in visit on 06/17/14 (from the past 48 hour(s))  CBC with Differential [facility])     Status: Abnormal   Collection Time: 06/17/14  9:41 AM  Result Value Ref Range   WBC 13.4 (H) 4.0 - 10.0 10e3/uL   RBC 5.14 4.20 - 5.70 10e6/uL   HGB 15.5 13.0 - 17.1 g/dL   HCT 47.0 38.7 - 49.9 %   MCV 91 82 - 98 fL   MCH 30.2 28.0 - 33.4 pg   MCHC 33.0 32.0 - 35.9 g/dL   RDW 14.9 11.1 - 15.7 %   Platelets 294 145 - 400 10e3/uL   NEUT# 10.4 (H) 1.5 - 6.5 10e3/uL   LYMPH# 1.4 0.9 - 3.3 10e3/uL   MONO# 1.2 (H) 0.1 - 0.9 10e3/uL   Eosinophils Absolute 0.4 0.0 - 0.5 10e3/uL   BASO# 0.1 0.0 - 0.2 10e3/uL   NEUT% 77.6 40.0 - 80.0 %   LYMPH% 10.3 (L) 14.0 - 48.0 %   MONO% 8.9 0.0 - 13.0 %   EOS% 2.8 0.0 - 7.0 %   BASO% 0.4 0.0 - 2.0 %  [facility] - Smear     Status: None   Collection Time: 06/17/14  9:41  AM  Result Value Ref Range   Smear Result Smear Available       RADIOGRAPHY: No results found.     PATHOLOGY: None  ASSESSMENT/PLAN: Mr. [name] of a very pleasant 81 yo male with  an 8 month history of elevated WBC counts. He is doing well and has been asymptomatic with this.  His WBC count today is 13.4.  We will continue to follow along with him to see if he trends back down. We will plan to see him back in 4 months for labs and follow-up.  All questions were answered. He knows to call the clinic with any problems, questions or concerns. We can certainly see him much sooner if necessary.  The patient was discussed with and also seen by Dr. Marin Olp and he is in agreement with the aforementioned.   [facility]    Addendum:  I saw and examined the patient with Tyreisha Ungar. We looked at his blood smear. I do not see anything on his blood smear that looked suspicious for any bone marrow disorder. There were no immature myeloid cells. There is no nucleated red cells. I saw no teardrop red cells. She had no atypical lymphocytes. There were no hypersegmented polys. Platelets looked normal in size and granulation.  At this point, I don't think that he has a hematologic malignancy. It is possible and probable that the leukocytosis is more reactive. It is minimal and he is asymptomatic. There is nothing on his exam that would suggest a myeloproliferative process.  We spent about 40-45 minutes with him. We answered all his questions.  We will see him back in about 4 months. I think if everything looks stable in 4 months, then we can probably let him go from the practice.  Laurey Arrow

## 2014-07-24 ENCOUNTER — Encounter: Payer: Self-pay | Admitting: Family Medicine

## 2014-07-24 ENCOUNTER — Ambulatory Visit (INDEPENDENT_AMBULATORY_CARE_PROVIDER_SITE_OTHER): Payer: Medicare Other | Admitting: Family Medicine

## 2014-07-24 ENCOUNTER — Ambulatory Visit (HOSPITAL_BASED_OUTPATIENT_CLINIC_OR_DEPARTMENT_OTHER)
Admission: RE | Admit: 2014-07-24 | Discharge: 2014-07-24 | Disposition: A | Discharge status: Home / Self Care | Payer: Medicare Other | Source: Ambulatory Visit | Attending: Family Medicine | Admitting: Family Medicine

## 2014-07-24 VITALS — BP 153/101 | HR 88 | Temp 98.3°F | Wt 147.4 lb

## 2014-07-24 DIAGNOSIS — J984 Other disorders of lung: Secondary | ICD-10-CM

## 2014-07-24 DIAGNOSIS — J849 Interstitial pulmonary disease, unspecified: Secondary | ICD-10-CM | POA: Insufficient documentation

## 2014-07-24 DIAGNOSIS — I10 Essential (primary) hypertension: Secondary | ICD-10-CM

## 2014-07-24 DIAGNOSIS — R0602 Shortness of breath: Secondary | ICD-10-CM | POA: Insufficient documentation

## 2014-07-24 DIAGNOSIS — J449 Chronic obstructive pulmonary disease, unspecified: Secondary | ICD-10-CM | POA: Insufficient documentation

## 2014-07-24 MED ORDER — PREDNISONE 10 MG PO TABS: ORAL_TABLET | ORAL | Status: DC

## 2014-07-24 MED ORDER — ALBUTEROL SULFATE HFA 108 (90 BASE) MCG/ACT IN AERS: 2.0000 | INHALATION_SPRAY | Freq: Four times a day (QID) | RESPIRATORY_TRACT | Status: DC | PRN

## 2014-07-24 MED ORDER — AZITHROMYCIN 250 MG PO TABS: ORAL_TABLET | ORAL | Status: DC

## 2014-07-24 MED ORDER — ALBUTEROL SULFATE (2.5 MG/3ML) 0.083% IN NEBU
2.5000 mg | INHALATION_SOLUTION | Freq: Once | RESPIRATORY_TRACT | Status: AC
  Administered 2014-07-24: 2.5 mg via RESPIRATORY_TRACT

## 2014-07-24 NOTE — Patient Instructions (Signed)
Follow up in 1 month to recheck BP Go downstairs and get your chest xray Start the Prednisone as directed- take w/ food Take the Zpack as directed for possible infection Use the Albuterol as needed- 2 puffs every 4-6 hrs for shortness of breath, wheezing Call with any questions or concerns Hang in there!!

## 2014-07-24 NOTE — Progress Notes (Signed)
Pre visit review using our clinic review tool, if applicable. No additional management support is needed unless otherwise documented below in the visit note. 

## 2014-07-24 NOTE — Progress Notes (Signed)
   Subjective:    Patient ID: [name], male    DOB: 30-Dec-1931, 81 y.o.   MRN: [id number]  HPI URI- pt reports 'it's hard to breathe'.  Started a few weeks ago but worsening.  Having some dizziness.  Denies facial pain/pressure.  No ear pain.  + cough- intermittently productive.  No fevers.  No N/V.  Hasn't taken any OTC meds.  Doesn't have any inhalers currently.  Has seen Dr Melvyn Novas previously.   Review of Systems For ROS see HPI     Objective:   Physical Exam  Constitutional: He is oriented to person, place, and time. He appears well-developed and well-nourished. No distress.  HENT:  Head: Normocephalic and atraumatic.  Right Ear: Tympanic membrane normal.  Left Ear: Tympanic membrane normal.  Nose: No mucosal edema or rhinorrhea. Right sinus exhibits no maxillary sinus tenderness and no frontal sinus tenderness. Left sinus exhibits no maxillary sinus tenderness and no frontal sinus tenderness.  Mouth/Throat: Mucous membranes are normal. No oropharyngeal exudate, posterior oropharyngeal edema or posterior oropharyngeal erythema.  Eyes: Conjunctivae and EOM are normal. Pupils are equal, round, and reactive to light.  Neck: Normal range of motion. Neck supple.  Cardiovascular: Normal rate, regular rhythm and normal heart sounds.   Pulmonary/Chest: Effort normal. No respiratory distress. He has no wheezes.  + hacking cough Coarse BS bilaterally Increased WOB- improved s/p neb tx in office  Musculoskeletal: He exhibits no edema.  Lymphadenopathy:    He has no cervical adenopathy.  Neurological: He is alert and oriented to person, place, and time.  Skin: Skin is warm and dry.  Psychiatric: He has a normal mood and affect. His behavior is normal.  Vitals reviewed.         Assessment & Plan:

## 2014-07-25 NOTE — Assessment & Plan Note (Signed)
Deteriorated.  Suspect this is b/c of his SOB and illness today but pt to return in 1 month to recheck BP.  If still elevated at that time, will need to adjust medications.  Pt expressed understanding and is in agreement w/ plan.

## 2014-07-25 NOTE — Assessment & Plan Note (Signed)
Deteriorated.  Pt's O2 level is ok in office but he has increased WOB.  WOB improved s/p neb tx.  Due to coarse BS and SOB, will get CXR to assess for recurrent PNA (pt has hx of this).  Start Prednisone taper to improve WOB, albuterol inhaler prn, and Zpack in case of infxn.  Reviewed supportive care and red flags that should prompt return.  Pt expressed understanding and is in agreement w/ plan.

## 2014-08-25 ENCOUNTER — Encounter: Payer: Self-pay | Admitting: Family Medicine

## 2014-08-25 ENCOUNTER — Telehealth: Payer: Self-pay | Admitting: *Deleted

## 2014-08-25 ENCOUNTER — Ambulatory Visit (INDEPENDENT_AMBULATORY_CARE_PROVIDER_SITE_OTHER): Payer: Medicare Other | Admitting: Family Medicine

## 2014-08-25 VITALS — BP 136/84 | HR 111 | Temp 98.3°F | Resp 17 | Ht 68.0 in | Wt 148.5 lb

## 2014-08-25 DIAGNOSIS — I10 Essential (primary) hypertension: Secondary | ICD-10-CM

## 2014-08-25 DIAGNOSIS — J984 Other disorders of lung: Secondary | ICD-10-CM

## 2014-08-25 MED ORDER — BECLOMETHASONE DIPROPIONATE 40 MCG/ACT IN AERS: 1.0000 | INHALATION_SPRAY | Freq: Two times a day (BID) | RESPIRATORY_TRACT | Status: DC

## 2014-08-25 MED ORDER — ATORVASTATIN CALCIUM 20 MG PO TABS: 20.0000 mg | ORAL_TABLET | Freq: Every day | ORAL | Status: DC

## 2014-08-25 MED ORDER — VALSARTAN 80 MG PO TABS: 80.0000 mg | ORAL_TABLET | Freq: Every day | ORAL | Status: DC

## 2014-08-25 NOTE — Telephone Encounter (Signed)
Prior authorization for QVAR approved

## 2014-08-25 NOTE — Patient Instructions (Signed)
Follow up as scheduled in August Start the Qvar inhaler- 1 puff twice daily- for the shortness of breath (regardless of how you feel) No BP changes at this time Keep up the good work! Call with any questions or concerns Happy 4th of July!

## 2014-08-25 NOTE — Progress Notes (Signed)
   Subjective:    Patient ID: [name], male    DOB: 22-Dec-1931, 81 y.o.   MRN: [id number]  HPI HTN- chronic problem.  BP is better today than previous.  On Valsartan daily.  Denies CP, HAs, visual changes.  Chronic SOB.  SOB- pt has chronic restrictive lung disease.  Not currently on medication.  Has albuterol prn.  Asking if he needs a controller inhaler for better control of SOB.   Review of Systems For ROS see HPI     Objective:   Physical Exam  Constitutional: He is oriented to person, place, and time. He appears well-developed and well-nourished. No distress.  HENT:  Head: Normocephalic and atraumatic.  Eyes: Conjunctivae and EOM are normal. Pupils are equal, round, and reactive to light.  Neck: Normal range of motion. Neck supple. No thyromegaly present.  Cardiovascular: Normal rate, regular rhythm, normal heart sounds and intact distal pulses.   No murmur heard. Pulmonary/Chest: Effort normal and breath sounds normal. No respiratory distress.  Abdominal: Soft. Bowel sounds are normal. He exhibits no distension.  Musculoskeletal: He exhibits no edema.  Lymphadenopathy:    He has no cervical adenopathy.  Neurological: He is alert and oriented to person, place, and time. No cranial nerve deficit.  Skin: Skin is warm and dry.  Psychiatric: He has a normal mood and affect. His behavior is normal.  Vitals reviewed.         Assessment & Plan:

## 2014-08-25 NOTE — Progress Notes (Signed)
Pre visit review using our clinic review tool, if applicable. No additional management support is needed unless otherwise documented below in the visit note. 

## 2014-09-01 NOTE — Assessment & Plan Note (Signed)
Chronic problem.  BP is at goal today.  He is asymptomatic.  Based on this, we will not make any med changes.  Will continue to follow closely.

## 2014-09-01 NOTE — Assessment & Plan Note (Signed)
Pt reports ongoing SOB- better than previous- and wants to try daily controller inhaler like his son uses.  Based on this, will start Qvar daily and pt will continue to use albuterol inhaler prn.  Reviewed supportive care and red flags that should prompt return.  Pt expressed understanding and is in agreement w/ plan.

## 2014-09-15 DIAGNOSIS — L219 Seborrheic dermatitis, unspecified: Secondary | ICD-10-CM | POA: Diagnosis not present

## 2014-09-15 DIAGNOSIS — L570 Actinic keratosis: Secondary | ICD-10-CM | POA: Diagnosis not present

## 2014-09-15 DIAGNOSIS — D225 Melanocytic nevi of trunk: Secondary | ICD-10-CM | POA: Diagnosis not present

## 2014-09-15 DIAGNOSIS — D1801 Hemangioma of skin and subcutaneous tissue: Secondary | ICD-10-CM | POA: Diagnosis not present

## 2014-09-15 DIAGNOSIS — L814 Other melanin hyperpigmentation: Secondary | ICD-10-CM | POA: Diagnosis not present

## 2014-09-15 DIAGNOSIS — L821 Other seborrheic keratosis: Secondary | ICD-10-CM | POA: Diagnosis not present

## 2014-09-18 ENCOUNTER — Telehealth: Payer: Self-pay | Admitting: Family Medicine

## 2014-09-18 NOTE — Telephone Encounter (Signed)
pre visit letter mailed 09/17/14

## 2014-10-07 ENCOUNTER — Telehealth: Payer: Self-pay | Admitting: *Deleted

## 2014-10-07 NOTE — Telephone Encounter (Signed)
Unable to reach patient at time of Pre-Visit Call.  Unable to leave message because patient has voicemail box that has not been set up yet.

## 2014-10-08 ENCOUNTER — Ambulatory Visit (INDEPENDENT_AMBULATORY_CARE_PROVIDER_SITE_OTHER): Payer: Medicare Other | Admitting: Family Medicine

## 2014-10-08 ENCOUNTER — Other Ambulatory Visit: Payer: Self-pay | Admitting: General Practice

## 2014-10-08 ENCOUNTER — Encounter: Payer: Self-pay | Admitting: General Practice

## 2014-10-08 ENCOUNTER — Encounter: Payer: Self-pay | Admitting: Family Medicine

## 2014-10-08 VITALS — BP 124/82 | HR 100 | Temp 97.9°F | Resp 17 | Ht 68.0 in | Wt 146.4 lb

## 2014-10-08 DIAGNOSIS — Z0000 Encounter for general adult medical examination without abnormal findings: Secondary | ICD-10-CM | POA: Diagnosis not present

## 2014-10-08 DIAGNOSIS — Z8673 Personal history of transient ischemic attack (TIA), and cerebral infarction without residual deficits: Secondary | ICD-10-CM

## 2014-10-08 DIAGNOSIS — Z125 Encounter for screening for malignant neoplasm of prostate: Secondary | ICD-10-CM | POA: Diagnosis not present

## 2014-10-08 DIAGNOSIS — J984 Other disorders of lung: Secondary | ICD-10-CM

## 2014-10-08 DIAGNOSIS — I6529 Occlusion and stenosis of unspecified carotid artery: Secondary | ICD-10-CM

## 2014-10-08 DIAGNOSIS — M810 Age-related osteoporosis without current pathological fracture: Secondary | ICD-10-CM

## 2014-10-08 DIAGNOSIS — E785 Hyperlipidemia, unspecified: Secondary | ICD-10-CM | POA: Diagnosis not present

## 2014-10-08 DIAGNOSIS — I10 Essential (primary) hypertension: Secondary | ICD-10-CM | POA: Diagnosis not present

## 2014-10-08 LAB — HEPATIC FUNCTION PANEL
ALT: 21 U/L (ref 0–53)
AST: 18 U/L (ref 0–37)
Albumin: 4.3 g/dL (ref 3.5–5.2)
Alkaline Phosphatase: 73 U/L (ref 39–117)
BILIRUBIN DIRECT: 0.3 mg/dL (ref 0.0–0.3)
BILIRUBIN TOTAL: 1 mg/dL (ref 0.2–1.2)
TOTAL PROTEIN: 6.4 g/dL (ref 6.0–8.3)

## 2014-10-08 LAB — CBC WITH DIFFERENTIAL/PLATELET
BASOS ABS: 0 10*3/uL (ref 0.0–0.1)
Basophils Relative: 0.4 % (ref 0.0–3.0)
EOS ABS: 0.3 10*3/uL (ref 0.0–0.7)
Eosinophils Relative: 1.9 % (ref 0.0–5.0)
HCT: 51 % (ref 39.0–52.0)
Hemoglobin: 16.7 g/dL (ref 13.0–17.0)
Lymphocytes Relative: 24.9 % (ref 12.0–46.0)
Lymphs Abs: 3.4 10*3/uL (ref 0.7–4.0)
MCHC: 32.7 g/dL (ref 30.0–36.0)
MCV: 92.8 fl (ref 78.0–100.0)
MONOS PCT: 5.7 % (ref 3.0–12.0)
Monocytes Absolute: 0.8 10*3/uL (ref 0.1–1.0)
NEUTROS PCT: 67.1 % (ref 43.0–77.0)
Neutro Abs: 9.2 10*3/uL — ABNORMAL HIGH (ref 1.4–7.7)
Platelets: 227 10*3/uL (ref 150.0–400.0)
RBC: 5.49 Mil/uL (ref 4.22–5.81)
RDW: 15.3 % (ref 11.5–15.5)
WBC: 13.7 10*3/uL — ABNORMAL HIGH (ref 4.0–10.5)

## 2014-10-08 LAB — LIPID PANEL
CHOL/HDL RATIO: 3
Cholesterol: 107 mg/dL (ref 0–200)
HDL: 40.1 mg/dL (ref >39.00)
LDL Cholesterol: 56 mg/dL (ref 0–99)
NONHDL: 66.92
Triglycerides: 57 mg/dL (ref 0.0–149.0)
VLDL: 11.4 mg/dL (ref 0.0–40.0)

## 2014-10-08 LAB — BASIC METABOLIC PANEL
BUN: 18 mg/dL (ref 6–23)
CALCIUM: 9.5 mg/dL (ref 8.4–10.5)
CO2: 27 mEq/L (ref 19–32)
CREATININE: 0.87 mg/dL (ref 0.40–1.50)
Chloride: 104 mEq/L (ref 96–112)
GFR: 89.53 mL/min (ref >60.00)
Glucose, Bld: 143 mg/dL — ABNORMAL HIGH (ref 70–99)
Potassium: 4.5 mEq/L (ref 3.5–5.1)
Sodium: 141 mEq/L (ref 135–145)

## 2014-10-08 LAB — VITAMIN D 25 HYDROXY (VIT D DEFICIENCY, FRACTURES): VITD: 24.88 ng/mL — AB (ref 30.00–100.00)

## 2014-10-08 LAB — TSH: TSH: 1.99 u[IU]/mL (ref 0.35–4.50)

## 2014-10-08 LAB — PSA: PSA: 2.46 ng/mL (ref 0.10–4.00)

## 2014-10-08 MED ORDER — VITAMIN D (ERGOCALCIFEROL) 1.25 MG (50000 UNIT) PO CAPS: 50000.0000 [IU] | ORAL_CAPSULE | ORAL | Status: DC

## 2014-10-08 NOTE — Assessment & Plan Note (Signed)
Chronic problem.  Tolerating statin w/o difficulty.  Check labs.  Adjust meds prn  

## 2014-10-08 NOTE — Assessment & Plan Note (Signed)
Chronic problem.  On Fosamax weekly.  Due for repeat DEXA- order entered.

## 2014-10-08 NOTE — Progress Notes (Signed)
   Subjective:    Patient ID: [name], male    DOB: 29-Mar-1931, 81 y.o.   MRN: [id number]  HPI Here today for CPE.  Risk Factors: HTN- chronic problem, on Valsartan daily.  No CP, SOB, HAs, visual changes, edema. Hyperlipidemia- chronic problem, on Lipitor daily.  No abd pain, N/V, myalgias. Hx of CVA- goal is to manage w/ risk factor modification (BP control, lipid control).  On ASA daily. Carotid artery stenosis- based on pt's hx.  No recent imaging- due for carotid dopplers Restrictive lung disease- ongoing issue for pt.  On Qvar daily and albuterol PRN. Osteoporosis- on Fosamax daily.  Most recent DEXA shows T score of -4.5 of L spine Physical Activity: Fall Risk: low risk Depression: denies current sxs Hearing: normal to conversational tones, mildly decreased to whispered voice ADL's: independent Cognitive: normal linear thought process, memory and attention intact Home Safety: safe at home, has good local support in son Height, Weight, BMI, Visual Acuity: see vitals, vision corrected to 20/20 w/ glasses Counseling: UTD on colonoscopy (2013), will need Pneumovax at next visit Care team reviewed and updated w/ pt Labs Ordered: See A&P Care Plan: See A&P    Review of Systems Patient reports no vision/hearing changes, anorexia, fever ,adenopathy, persistant/recurrent hoarseness, swallowing issues, chest pain, palpitations, edema, persistant/recurrent cough, hemoptysis, dyspnea (rest,exertional, paroxysmal nocturnal), gastrointestinal  bleeding (melena, rectal bleeding), abdominal pain, excessive heart burn, GU symptoms (dysuria, hematuria, voiding/incontinence issues) syncope, focal weakness, memory loss, numbness & tingling, skin/hair/nail changes, depression, anxiety, abnormal bruising/bleeding, musculoskeletal symptoms/signs.     Objective:   Physical Exam BP 124/82 mmHg  Pulse 100  Temp(Src) 97.9 F (36.6 C) (Oral)  Resp 17  Ht 5\' 8"  (1.727 m)  Wt 146 lb 6 oz (66.395  kg)  BMI 22.26 kg/m2  SpO2 95%  General Appearance:    Alert, cooperative, no distress, appears stated age, somewhat frail  Head:    Normocephalic, without obvious abnormality, atraumatic  Eyes:    PERRL, conjunctiva/corneas clear, EOM's intact, fundi    benign, both eyes       Ears:    Normal TM's and external ear canals, both ears  Nose:   Nares normal, septum midline, mucosa normal, no drainage   or sinus tenderness  Throat:   Lips, mucosa, and tongue normal; teeth and gums normal  Neck:   Supple, symmetrical, trachea midline, no adenopathy;       thyroid:  No enlargement/tenderness/nodules  Back:     Symmetric, no curvature, ROM normal, no CVA tenderness  Lungs:     Clear to auscultation bilaterally, respirations unlabored  Chest wall:    No tenderness or deformity  Heart:    Regular rate and rhythm, S1 and S2 normal, no murmur, rub   or gallop  Abdomen:     Soft, non-tender, bowel sounds active all four quadrants,    no masses, no organomegaly  Genitalia:    Normal male without lesion, discharge or tenderness  Rectal:    Normal tone, normal prostate, no masses or tenderness  Extremities:   Extremities normal, atraumatic, no cyanosis or edema  Pulses:   2+ and symmetric all extremities  Skin:   Skin color, texture, turgor normal, no rashes or lesions  Lymph nodes:   Cervical, supraclavicular, and axillary nodes normal  Neurologic:   CNII-XII intact. Normal strength, sensation and reflexes      throughout         Assessment & Plan:

## 2014-10-08 NOTE — Assessment & Plan Note (Signed)
Chronic problem.  Adequate control on current meds.  Asymptomatic.  Check labs.  No anticipated med changes. 

## 2014-10-08 NOTE — Assessment & Plan Note (Signed)
Chronic problem.  Pt is due for repeat imaging.  Carotid dopplers ordered

## 2014-10-08 NOTE — Progress Notes (Signed)
Pre visit review using our clinic review tool, if applicable. No additional management support is needed unless otherwise documented below in the visit note. 

## 2014-10-08 NOTE — Assessment & Plan Note (Signed)
Pt's PE unchanged from previous.  UTD on colonoscopy.  Requires Pneumovax at next visit (too early to give today).  Due for DEXA- order entered.  Written screening schedule updated and given to pt.  Check labs.  Anticipatory guidance provided.

## 2014-10-08 NOTE — Patient Instructions (Signed)
Follow up in 6 months to recheck BP and cholesterol We'll notify you of your lab results and make any changes if needed We'll call you with your carotid doppler appt You are good on colonoscopy until 2023- this is great news! Make sure you are eating regularly and continue to get daily activity Keep up the good work!  You look great! Call with any questions or concerns Enjoy the rest of your summer!!

## 2014-10-08 NOTE — Assessment & Plan Note (Signed)
Goal is to minimize risk w/ BP and lipid control.  On ASA daily.

## 2014-10-08 NOTE — Assessment & Plan Note (Signed)
Chronic problem.  Lungs are CTA today.  Denies current SOB.  Using ICS daily and albuterol as needed.  Will continue to follow.

## 2014-10-13 ENCOUNTER — Ambulatory Visit (HOSPITAL_BASED_OUTPATIENT_CLINIC_OR_DEPARTMENT_OTHER)
Admission: RE | Admit: 2014-10-13 | Discharge: 2014-10-13 | Disposition: A | Discharge status: Home / Self Care | Payer: Medicare Other | Source: Ambulatory Visit | Attending: Family Medicine | Admitting: Family Medicine

## 2014-10-13 ENCOUNTER — Other Ambulatory Visit (HOSPITAL_BASED_OUTPATIENT_CLINIC_OR_DEPARTMENT_OTHER): Payer: Medicare Other

## 2014-10-13 ENCOUNTER — Encounter: Payer: Self-pay | Admitting: General Practice

## 2014-10-13 ENCOUNTER — Encounter: Payer: Self-pay | Admitting: Hematology & Oncology

## 2014-10-13 ENCOUNTER — Ambulatory Visit (HOSPITAL_BASED_OUTPATIENT_CLINIC_OR_DEPARTMENT_OTHER): Payer: Medicare Other | Admitting: Hematology & Oncology

## 2014-10-13 VITALS — BP 162/77 | HR 66 | Temp 97.5°F | Resp 20 | Ht 68.0 in | Wt 146.1 lb

## 2014-10-13 DIAGNOSIS — I6523 Occlusion and stenosis of bilateral carotid arteries: Secondary | ICD-10-CM | POA: Insufficient documentation

## 2014-10-13 DIAGNOSIS — I6529 Occlusion and stenosis of unspecified carotid artery: Secondary | ICD-10-CM

## 2014-10-13 DIAGNOSIS — D72829 Elevated white blood cell count, unspecified: Secondary | ICD-10-CM

## 2014-10-13 LAB — CBC WITH DIFFERENTIAL (CANCER CENTER ONLY)
BASO#: 0 10*3/uL (ref 0.0–0.2)
BASO%: 0.3 % (ref 0.0–2.0)
EOS ABS: 0.3 10*3/uL (ref 0.0–0.5)
EOS%: 3.1 % (ref 0.0–7.0)
HCT: 44.6 % (ref 38.7–49.9)
HEMOGLOBIN: 15.1 g/dL (ref 13.0–17.1)
LYMPH#: 2.7 10*3/uL (ref 0.9–3.3)
LYMPH%: 26.2 % (ref 14.0–48.0)
MCH: 31.1 pg (ref 28.0–33.4)
MCHC: 33.9 g/dL (ref 32.0–35.9)
MCV: 92 fL (ref 82–98)
MONO#: 0.9 10*3/uL (ref 0.1–0.9)
MONO%: 8.5 % (ref 0.0–13.0)
NEUT#: 6.3 10*3/uL (ref 1.5–6.5)
NEUT%: 61.9 % (ref 40.0–80.0)
Platelets: 221 10*3/uL (ref 145–400)
RBC: 4.86 10*6/uL (ref 4.20–5.70)
RDW: 14.4 % (ref 11.1–15.7)
WBC: 10.2 10*3/uL — ABNORMAL HIGH (ref 4.0–10.0)

## 2014-10-13 LAB — CHCC SATELLITE - SMEAR

## 2014-10-13 NOTE — Progress Notes (Signed)
Hematology and Oncology Follow Up Visit  [name] [id number] 1931-03-04 81 y.o. 10/13/2014   Principle Diagnosis:   Leukocytosis-reactive/chronic  Current Therapy:    Observation     Interim History:  [name] is back for follow-up. This is his second office visit. He is doing okay. We first saw him, we cannot find anything that looked suspicious for an underlying hematologic problem.  He's had no change in medications. He's had no fever. He's had no infections. He's had no cough. He's on no change in bowel or bladder habits. He does have some urinary frequency.  He's not noted any, rashes.  He had a chest x-ray done in May. X-ray looked okay. He had some kyphoplasty at a couple thoracic vertebral bodies.  Overall, his performance status is ECOG 2.  Medications:  Current outpatient prescriptions:  .  albuterol (PROVENTIL HFA;VENTOLIN HFA) 108 (90 BASE) MCG/ACT inhaler, Inhale 2 puffs into the lungs every 6 (six) hours as needed for wheezing or shortness of breath., Disp: 1 Inhaler, Rfl: 2 .  alendronate (FOSAMAX) 70 MG tablet, Take 1 tablet (70 mg total) by mouth every 7 (seven) days. Take with a full glass of water on an empty stomach., Disp: 4 tablet, Rfl: 11 .  aspirin EC 81 MG tablet, Take 81 mg by mouth daily., Disp: , Rfl:  .  atorvastatin (LIPITOR) 20 MG tablet, Take 1 tablet (20 mg total) by mouth daily., Disp: 90 tablet, Rfl: 3 .  beclomethasone (QVAR) 40 MCG/ACT inhaler, Inhale 1 puff into the lungs 2 (two) times daily., Disp: 1 Inhaler, Rfl: 12 .  calcium carbonate (OS-CAL) 600 MG TABS tablet, Take 600 mg by mouth daily with breakfast., Disp: , Rfl:  .  docusate sodium (COLACE) 100 MG capsule, Take 100 mg by mouth as needed for mild constipation., Disp: , Rfl:  .  HYDROcodone-acetaminophen (NORCO/VICODIN) 5-325 MG per tablet, Take 1-2 tablets by mouth every 6 (six) hours as needed for moderate pain., Disp: 45 tablet, Rfl: 0 .  hydrocortisone 1 % lotion, Apply 1  application topically 2 (two) times daily., Disp: , Rfl:  .  valsartan (DIOVAN) 80 MG tablet, Take 1 tablet (80 mg total) by mouth daily., Disp: 90 tablet, Rfl: 1 .  Vitamin D, Ergocalciferol, (DRISDOL) 50000 UNITS CAPS capsule, Take 1 capsule (50,000 Units total) by mouth every 7 (seven) days., Disp: 12 capsule, Rfl: 0  Allergies: No Known Allergies  Past Medical History, Surgical history, Social history, and Family History were reviewed and updated.  Review of Systems: As above  Physical Exam:  height is 5\' 8"  (1.727 m) and weight is 146 lb 1.9 oz (66.279 kg). His oral temperature is 97.5 F (36.4 C). His blood pressure is 162/77 and his pulse is 66. His respiration is 20.   Wt Readings from Last 3 Encounters:  10/13/14 146 lb 1.9 oz (66.279 kg)  10/08/14 146 lb 6 oz (66.395 kg)  08/25/14 148 lb 8 oz (67.359 kg)     Elderly, thin white gentleman in no obvious distress. Head and neck exam shows no ocular or oral lesions. There are no palpable cervical or supraclavicular lymph nodes. Lungs are clear. Cardiac exam regular rate and rhythm with a normal S1 and S2. There are no murmurs rubs or bruits. Abdomen is soft. Has good bowel sounds. There is no fluid wave. There is no palpable liver or spleen tip. Back exam shows kyphosis. Extremities shows some age related osteoarthritic changes. No joint swelling is noted. Skin  exam shows some scattered ecchymoses. Neurological exam shows no focal neurological deficits.  Lab Results  Component Value Date   WBC 10.2* 10/13/2014   HGB 15.1 10/13/2014   HCT 44.6 10/13/2014   MCV 92 10/13/2014   PLT 221 10/13/2014     Chemistry      Component Value Date/Time   NA 141 10/08/2014 0838   K 4.5 10/08/2014 0838   CL 104 10/08/2014 0838   CO2 27 10/08/2014 0838   BUN 18 10/08/2014 0838   BUN 12 08/22/2013   CREATININE 0.87 10/08/2014 0838   CREATININE 0.9 08/22/2013      Component Value Date/Time   CALCIUM 9.5 10/08/2014 0838   ALKPHOS 73  10/08/2014 0838   AST 18 10/08/2014 0838   ALT 21 10/08/2014 0838   BILITOT 1.0 10/08/2014 0838         Impression and Plan: [name] is 81 year old white male. He has minimal leukocytosis. His white cell count is better today than it was we first saw him.  I'll addend his blood smear under the microscope. I do not see anything that looked suspicious. He had no nucleated red cells. There were no immature myeloid cells.  I don't think we have to get him back to the office. As nice as he is, I just don't see that we are going to help him. I just do not see any obvious hematologic malignancy or myeloproliferative neoplasm that we would have to worry about.  I would be more than happy to see him in the future if any problems arise.   Volanda Napoleon, MD 8/15/20168:41 AM

## 2014-10-27 ENCOUNTER — Telehealth: Payer: Self-pay | Admitting: Family Medicine

## 2014-10-27 NOTE — Telephone Encounter (Signed)
Caller name: Laksh  Relation to pt: Self Call back number: [phone] [place], [place]  Reason for call: Pt came in office stating if he can have rx of Flovent HFA Diskus instead of beclomethasone (QVAR) 40 MCG/ACT inhaler, pt states was recommended  the Flovent since it is generic. Please advise.

## 2014-10-28 MED ORDER — FLUTICASONE PROPIONATE HFA 110 MCG/ACT IN AERO: 2.0000 | INHALATION_SPRAY | Freq: Two times a day (BID) | RESPIRATORY_TRACT | Status: DC

## 2014-10-28 NOTE — Telephone Encounter (Signed)
Medication filled to pharmacy as requested. Chart update to reflect change.

## 2014-10-28 NOTE — Telephone Encounter (Signed)
Ok for [employer] HFA 110 mcg 2 puffs BID.  6 refills

## 2014-11-05 ENCOUNTER — Other Ambulatory Visit: Payer: Self-pay | Admitting: Endocrinology

## 2014-11-05 NOTE — Telephone Encounter (Signed)
Please refill x 1 month Ov is due < 1 month

## 2014-11-05 NOTE — Telephone Encounter (Signed)
Please advise if ok to refill. Last office visit was 11/27/2013. Thanks!

## 2014-11-06 NOTE — Telephone Encounter (Signed)
Rx sent and appointment letter mailed to the pt.  

## 2014-11-19 ENCOUNTER — Ambulatory Visit: Payer: Medicare Other

## 2014-11-19 DIAGNOSIS — Z23 Encounter for immunization: Secondary | ICD-10-CM

## 2014-11-19 MED ORDER — INFLUENZA VAC SPLIT QUAD 0.5 ML IM SUSY: 0.5000 mL | PREFILLED_SYRINGE | Freq: Once | INTRAMUSCULAR | Status: DC

## 2014-11-27 ENCOUNTER — Ambulatory Visit (INDEPENDENT_AMBULATORY_CARE_PROVIDER_SITE_OTHER): Payer: Medicare Other | Admitting: Endocrinology

## 2014-11-27 ENCOUNTER — Encounter: Payer: Self-pay | Admitting: Endocrinology

## 2014-11-27 VITALS — BP 104/82 | HR 96 | Temp 97.8°F | Ht 68.0 in | Wt 147.0 lb

## 2014-11-27 DIAGNOSIS — M810 Age-related osteoporosis without current pathological fracture: Secondary | ICD-10-CM | POA: Diagnosis not present

## 2014-11-27 DIAGNOSIS — I6529 Occlusion and stenosis of unspecified carotid artery: Secondary | ICD-10-CM | POA: Diagnosis not present

## 2014-11-27 LAB — VITAMIN D 25 HYDROXY (VIT D DEFICIENCY, FRACTURES): VITD: 59.36 ng/mL (ref 30.00–100.00)

## 2014-11-27 MED ORDER — ALENDRONATE SODIUM 70 MG PO TABS: ORAL_TABLET | ORAL | Status: DC

## 2014-11-27 NOTE — Progress Notes (Signed)
Subjective:    Patient ID: [name], male    DOB: 05/07/1931, 81 y.o.   MRN: [id number]  HPI  Pt returns for f/u of osteoporosis: (fosamax was started in 2015; he has hypogonadism, but rx of this is precluded by BPH; CXR has shown vertebral compression fractures; he has also been rx'ed high-dose ergocalciferol for vit-D deficiency). Pt says he takes fosamax as rx, and tolerates well.  No heartburn. He takes OTC vit-D, but not rx ergocalciferol.  Denies leg cramps. No change in decreased urinary stream Past Medical History  Diagnosis Date  . Osteoporosis   . COPD (chronic obstructive pulmonary disease)   . Coronary artery disease   . Hypertension   . BPH (benign prostatic hyperplasia)   . HLD (hyperlipidemia)   . Pneumonia   . Arthritis     hands   . CVA (cerebral infarction) 3 years ago    mild stroke    Past Surgical History  Procedure Laterality Date  . Back surgery    . Laparoscopic appendectomy N/A 11/02/2013    Procedure: APPENDECTOMY LAPAROSCOPIC;  Surgeon: Donnie Mesa, MD;  Location: [facility];  Service: General;  Laterality: N/A;    Social History   Social History  . Marital Status: Widowed    Spouse Name: N/A  . Number of Children: N/A  . Years of Education: N/A   Occupational History  . Retired    Social History Main Topics  . Smoking status: Former Smoker -- 1.00 packs/day for 35 years    Types: Cigarettes    Quit date: 02/29/1988  . Smokeless tobacco: Never Used  . Alcohol Use: No  . Drug Use: No  . Sexual Activity: Not on file   Other Topics Concern  . Not on file   Social History Narrative    Current Outpatient Prescriptions on File Prior to Visit  Medication Sig Dispense Refill  . albuterol (PROVENTIL HFA;VENTOLIN HFA) 108 (90 BASE) MCG/ACT inhaler Inhale 2 puffs into the lungs every 6 (six) hours as needed for wheezing or shortness of breath. 1 Inhaler 2  . aspirin EC 81 MG tablet Take 81 mg by mouth daily.    [name] atorvastatin (LIPITOR)  20 MG tablet Take 1 tablet (20 mg total) by mouth daily. 90 tablet 3  . calcium carbonate (OS-CAL) 600 MG TABS tablet Take 600 mg by mouth daily with breakfast.    . docusate sodium (COLACE) 100 MG capsule Take 100 mg by mouth as needed for mild constipation.    . fluticasone (FLOVENT HFA) 110 MCG/ACT inhaler Inhale 2 puffs into the lungs 2 (two) times daily. 1 Inhaler 12  . hydrocortisone 1 % lotion Apply 1 application topically 2 (two) times daily.    . valsartan (DIOVAN) 80 MG tablet Take 1 tablet (80 mg total) by mouth daily. 90 tablet 1   No current facility-administered medications on file prior to visit.    No Known Allergies  Family History  Problem Relation Age of Onset  . Colon cancer Brother   .       BP 104/82 mmHg  Pulse 96  Temp(Src) 97.8 F (36.6 C) (Oral)  Ht 5\' 8"  (1.727 m)  Wt 147 lb (66.679 kg)  BMI 22.36 kg/m2  SpO2 95%   Review of Systems Denies falls and dizziness.     Objective:   Physical Exam VITAL SIGNS:  See vs page GENERAL: no distress Chest wall: kyphosis is again noted Gait: normal and steady.   Lab  Results  Component Value Date   PSA 2.46 10/08/2014   PSA 3.23 10/15/2013      Assessment & Plan:  Vit-D deficiency, due for recheck. Osteoporosis: tolerating rx well.  Please continue the same fosamax. BPH: this precludes rx of hypogonadism.   Patient is advised the following: Patient Instructions  Please continue the same alendronate. We can't increase the testosterone because of your prostate enlargement.  blood tests are requested for you today.  We'll let you know about the results. it is critically important to prevent falling down (keep floor areas well-lit, dry, and free of loose objects.  If you have a cane, walker, or wheelchair, you should use it, even for short trips around the house.  Also, try not to rush).   Please return in 1 year, when you will be due for another bone density test.

## 2014-11-27 NOTE — Patient Instructions (Addendum)
Please continue the same alendronate. We can't increase the testosterone because of your prostate enlargement.  blood tests are requested for you today.  We'll let you know about the results. it is critically important to prevent falling down (keep floor areas well-lit, dry, and free of loose objects.  If you have a cane, walker, or wheelchair, you should use it, even for short trips around the house.  Also, try not to rush).   Please return in 1 year, when you will be due for another bone density test.

## 2014-12-01 ENCOUNTER — Telehealth: Payer: Self-pay | Admitting: Endocrinology

## 2014-12-01 LAB — PTH, INTACT AND CALCIUM

## 2014-12-01 NOTE — Telephone Encounter (Signed)
[facility] called stated that they need another blood draw for parathyroid test, the serum on the first test was not frozen.patient need to ne redrawn.

## 2014-12-01 NOTE — Telephone Encounter (Signed)
please call patient, and tell him of need to redraw.

## 2014-12-01 NOTE — Telephone Encounter (Signed)
See note below to be advised. 

## 2014-12-02 NOTE — Telephone Encounter (Signed)
Left voicemail advising of note below. Requested call back from the patient to reschedule lab visit.

## 2014-12-03 ENCOUNTER — Ambulatory Visit (INDEPENDENT_AMBULATORY_CARE_PROVIDER_SITE_OTHER): Payer: Medicare Other | Admitting: Medical

## 2014-12-03 ENCOUNTER — Telehealth: Payer: Self-pay | Admitting: *Deleted

## 2014-12-03 ENCOUNTER — Encounter: Payer: Self-pay | Admitting: Medical

## 2014-12-03 ENCOUNTER — Ambulatory Visit (HOSPITAL_BASED_OUTPATIENT_CLINIC_OR_DEPARTMENT_OTHER)
Admission: RE | Admit: 2014-12-03 | Discharge: 2014-12-03 | Disposition: A | Discharge status: Home / Self Care | Payer: Medicare Other | Source: Ambulatory Visit | Attending: Medical | Admitting: Medical

## 2014-12-03 VITALS — BP 104/80 | HR 100 | Temp 98.1°F | Ht 68.0 in | Wt 144.4 lb

## 2014-12-03 DIAGNOSIS — J449 Chronic obstructive pulmonary disease, unspecified: Secondary | ICD-10-CM | POA: Diagnosis not present

## 2014-12-03 DIAGNOSIS — I6529 Occlusion and stenosis of unspecified carotid artery: Secondary | ICD-10-CM | POA: Diagnosis not present

## 2014-12-03 DIAGNOSIS — R062 Wheezing: Secondary | ICD-10-CM | POA: Diagnosis not present

## 2014-12-03 DIAGNOSIS — R0989 Other specified symptoms and signs involving the circulatory and respiratory systems: Secondary | ICD-10-CM | POA: Insufficient documentation

## 2014-12-03 DIAGNOSIS — J4489 Other specified chronic obstructive pulmonary disease: Secondary | ICD-10-CM

## 2014-12-03 DIAGNOSIS — R059 Cough, unspecified: Secondary | ICD-10-CM

## 2014-12-03 DIAGNOSIS — R05 Cough: Secondary | ICD-10-CM | POA: Diagnosis not present

## 2014-12-03 DIAGNOSIS — R509 Fever, unspecified: Secondary | ICD-10-CM | POA: Diagnosis not present

## 2014-12-03 DIAGNOSIS — J849 Interstitial pulmonary disease, unspecified: Secondary | ICD-10-CM | POA: Insufficient documentation

## 2014-12-03 LAB — CBC WITH DIFFERENTIAL/PLATELET
Basophils Absolute: 0.1 10*3/uL (ref 0.0–0.1)
Basophils Relative: 0.3 % (ref 0.0–3.0)
EOS ABS: 0.2 10*3/uL (ref 0.0–0.7)
EOS PCT: 1.2 % (ref 0.0–5.0)
HCT: 50.4 % (ref 39.0–52.0)
HEMOGLOBIN: 16.6 g/dL (ref 13.0–17.0)
LYMPHS ABS: 4.9 10*3/uL — AB (ref 0.7–4.0)
Lymphocytes Relative: 24.3 % (ref 12.0–46.0)
MCHC: 32.9 g/dL (ref 30.0–36.0)
MCV: 92.3 fl (ref 78.0–100.0)
MONO ABS: 2.3 10*3/uL — AB (ref 0.1–1.0)
Monocytes Relative: 11.3 % (ref 3.0–12.0)
NEUTROS PCT: 62.9 % (ref 43.0–77.0)
Neutro Abs: 12.8 10*3/uL — ABNORMAL HIGH (ref 1.4–7.7)
Platelets: 291 10*3/uL (ref 150.0–400.0)
RBC: 5.46 Mil/uL (ref 4.22–5.81)
RDW: 14.6 % (ref 11.5–15.5)

## 2014-12-03 MED ORDER — AZITHROMYCIN 250 MG PO TABS: ORAL_TABLET | ORAL | Status: DC

## 2014-12-03 MED ORDER — PREDNISONE 20 MG PO TABS: ORAL_TABLET | ORAL | Status: DC

## 2014-12-03 MED ORDER — ALBUTEROL SULFATE HFA 108 (90 BASE) MCG/ACT IN AERS: 2.0000 | INHALATION_SPRAY | Freq: Four times a day (QID) | RESPIRATORY_TRACT | Status: DC | PRN

## 2014-12-03 MED ORDER — CEFTRIAXONE SODIUM 1 G IJ SOLR
1.0000 g | Freq: Once | INTRAMUSCULAR | Status: AC
  Administered 2014-12-03: 1 g via INTRAMUSCULAR

## 2014-12-03 MED ORDER — IPRATROPIUM-ALBUTEROL 0.5-2.5 (3) MG/3ML IN SOLN
3.0000 mL | Freq: Once | RESPIRATORY_TRACT | Status: AC
  Administered 2014-12-03: 3 mL via RESPIRATORY_TRACT

## 2014-12-03 MED ORDER — IPRATROPIUM BROMIDE HFA 17 MCG/ACT IN AERS: 2.0000 | INHALATION_SPRAY | Freq: Four times a day (QID) | RESPIRATORY_TRACT | Status: DC | PRN

## 2014-12-03 NOTE — Telephone Encounter (Signed)
Attempted to reach pt and VM is not set up. Will try again later.

## 2014-12-03 NOTE — Patient Instructions (Signed)
You appear to have flare of both your copd and chronic bronchitis. But some concern for possible pneumonia.  We gave duo neb in office today. Rocephin 1 gram im.  cxr stat with cbc stat.  Rx atrovent inhaler.  Rx of taper prednisone but want to see result of cbc and cxr before we advise on whether or not to start.  Will follow results of lab and determine also the antibiotic that we will give. Will send the antibiotic rx to your pharmacy.  Follow up this Friday before the weekend or as needed  If your symptoms worsen despite our treatment then ED evaluation.

## 2014-12-03 NOTE — Progress Notes (Signed)
Subjective:    Patient ID: [name], male    DOB: 12/03/1931, 81 y.o.   MRN: [id number]  HPI  Pt in feeling sick for 3 days. Started cough, congestion and runny nose. Pt has copd. Pt was on albuterol inhaler prn use but has not used recently. He is only on  daily flovent presently.  Pt feels like he had some fever last 3 days.   When pt coughs he is bringing up some mucous.  Pt states he is winded. This is not his baseline per his report.  Pt has not used his albuterol. Only using his flovent.  Pt states he is susceptible to pneumonia.   Review of Systems  Constitutional: Positive for fever and fatigue.  HENT: Positive for congestion. Negative for ear pain, sinus pressure and sore throat.   Respiratory: Positive for cough and shortness of breath. Negative for chest tightness.   Cardiovascular: Negative for chest pain and palpitations.  Gastrointestinal: Negative for abdominal pain, constipation and blood in stool.  Musculoskeletal: Negative for back pain and gait problem.  Neurological: Negative for dizziness and headaches.  Hematological: Negative for adenopathy. Does not bruise/bleed easily.  Psychiatric/Behavioral: Negative for behavioral problems and confusion.    Past Medical History  Diagnosis Date  . Osteoporosis   . COPD (chronic obstructive pulmonary disease) ([place])   . Coronary artery disease   . Hypertension   . BPH (benign prostatic hyperplasia)   . HLD (hyperlipidemia)   . Pneumonia   . Arthritis     hands   . CVA (cerebral infarction) 3 years ago    mild stroke    Social History   Social History  . Marital Status: Widowed    Spouse Name: N/A  . Number of Children: N/A  . Years of Education: N/A   Occupational History  . Retired    Social History Main Topics  . Smoking status: Former Smoker -- 1.00 packs/day for 35 years    Types: Cigarettes    Quit date: 02/29/1988  . Smokeless tobacco: Never Used  . Alcohol Use: No  . Drug Use: No    . Sexual Activity: Not on file   Other Topics Concern  . Not on file   Social History Narrative    Past Surgical History  Procedure Laterality Date  . Back surgery    . Laparoscopic appendectomy N/A 11/02/2013    Procedure: APPENDECTOMY LAPAROSCOPIC;  Surgeon: Donnie Mesa, MD;  Location: [facility];  Service: General;  Laterality: N/A;    Family History  Problem Relation Age of Onset  . Colon cancer Brother   .       No Known Allergies  Current Outpatient Prescriptions on File Prior to Visit  Medication Sig Dispense Refill  . albuterol (PROVENTIL HFA;VENTOLIN HFA) 108 (90 BASE) MCG/ACT inhaler Inhale 2 puffs into the lungs every 6 (six) hours as needed for wheezing or shortness of breath. 1 Inhaler 2  . alendronate (FOSAMAX) 70 MG tablet TAKE 1 TABLET (70 MG TOTAL) BY MOUTH EVERY SEVEN   (SEVEN) DAYS. TAKE WITH A FULL GLASS OF WATER ON AT NOON TIME   EMPTY STOMACH. 4 tablet 12  . aspirin EC 81 MG tablet Take 81 mg by mouth daily.    [name] atorvastatin (LIPITOR) 20 MG tablet Take 1 tablet (20 mg total) by mouth daily. 90 tablet 3  . calcium carbonate (OS-CAL) 600 MG TABS tablet Take 600 mg by mouth daily with breakfast.    . docusate  sodium (COLACE) 100 MG capsule Take 100 mg by mouth as needed for mild constipation.    . fluticasone (FLOVENT HFA) 110 MCG/ACT inhaler Inhale 2 puffs into the lungs 2 (two) times daily. 1 Inhaler 12  . hydrocortisone 1 % lotion Apply 1 application topically 2 (two) times daily.    . valsartan (DIOVAN) 80 MG tablet Take 1 tablet (80 mg total) by mouth daily. 90 tablet 1   No current facility-administered medications on file prior to visit.    BP 104/80 mmHg  Pulse 100  Temp(Src) 98.1 F (36.7 C) (Oral)  Ht 5\' 8"  (1.727 m)  Wt 144 lb 6.4 oz (65.499 kg)  BMI 21.96 kg/m2  SpO2 98%       Objective:   Physical Exam   General  Mental Status - Alert. General Appearance - Well groomed. Not in acute distress.  Post neb treatment did feel  jittery.  Skin Rashes- No Rashes.  HEENT Head- Normal. Ear Auditory Canal - Left- Normal. Right - Normal.Tympanic Membrane- Left- Normal. Right- Normal. Eye Sclera/Conjunctiva- Left- Normal. Right- Normal. Nose & Sinuses Nasal Mucosa- Left-  Not oggy or Congested. Right-  Not  boggy or Congested. Mouth & Throat Lips: Upper Lip- Normal: no dryness, cracking, pallor, cyanosis, or vesicular eruption. Lower Lip-Normal: no dryness, cracking, pallor, cyanosis or vesicular eruption. Buccal Mucosa- Bilateral- No Aphthous ulcers. Oropharynx- No Discharge or Erythema. Tonsils: Characteristics- Bilateral- No Erythema or Congestion. Size/Enlargement- Bilateral- No enlargement. Discharge- bilateral-None.  Neck Neck- Supple. No Masses.   Chest and Lung Exam Auscultation: Breath Sounds:- even, shallow with mild labored appearance. Faint  Rhonchi through but very coarse breath sounds left lower lobe. Post neb treatment he breathed much clearer throughout.   Cardiovascular Auscultation:Rythm- Regular, rate and rhythm. Murmurs & Other Heart Sounds:Ausculatation of the heart reveal- No Murmurs.  Lymphatic Head & Neck General Head & Neck Lymphatics: Bilateral: Description- No Localized lymphadenopathy.  Lower ext- no pedal edema.        Assessment & Plan:  You appear to have flare of both your copd and chronic bronchitis. But some concern for possible pneumonia.  We gave duo neb in office today. Rocephin 1 gram im.  cxr stat with cbc stat.  Rx atrovent inhaler.  Rx of taper prednisone but want to see result of cbc and cxr before we advise on whether or not to start.  Will follow results of lab and determine also the antibiotic that we will give. Will send the antibiotic rx to your pharmacy.  Follow up this Friday before the weekend or as needed  If your symptoms worsen despite our treatment then ED evaluation.  [phone] Cell.

## 2014-12-03 NOTE — Telephone Encounter (Signed)
Spoke with pt and he voices understanding. Pt will follow up Friday Morning. Pt still feeling about the same.

## 2014-12-03 NOTE — Progress Notes (Signed)
Pre visit review using our clinic review tool, if applicable. No additional management support is needed unless otherwise documented below in the visit note/SLS  

## 2014-12-03 NOTE — Telephone Encounter (Signed)
[facility] called to report critical pts white count 20,300

## 2014-12-03 NOTE — Telephone Encounter (Signed)
Discussed with Dr. [name] pt high wbc, cxr results(neg for pneumonia) and clinical presentation. Will call in azithromycin to start today. Will refill his albuterol make sure he is using 2 inh every 6 hour until we follow up on Friday morning. Get atrovent if not too expensive. For time being do not take prednisone rx.(very important stress that).   Have him make appointment for this Friday. Block off 30 minutes and make it early am appointment so can get cbc back stat timely manner. If he feels worse before Friday then ED evaluation.  I called and could not get through to pt. No message box set up. Let me know whether or not you talk with him.

## 2014-12-03 NOTE — Addendum Note (Signed)
Addended by: Tasia Catchings on: 12/03/2014 10:09 AM   Modules accepted: Orders

## 2014-12-05 ENCOUNTER — Encounter: Payer: Self-pay | Admitting: Medical

## 2014-12-05 ENCOUNTER — Ambulatory Visit (INDEPENDENT_AMBULATORY_CARE_PROVIDER_SITE_OTHER): Payer: Medicare Other | Admitting: Medical

## 2014-12-05 VITALS — BP 106/78 | HR 61 | Temp 98.2°F | Ht 68.0 in | Wt 145.8 lb

## 2014-12-05 DIAGNOSIS — I6529 Occlusion and stenosis of unspecified carotid artery: Secondary | ICD-10-CM | POA: Diagnosis not present

## 2014-12-05 DIAGNOSIS — D72829 Elevated white blood cell count, unspecified: Secondary | ICD-10-CM

## 2014-12-05 DIAGNOSIS — J449 Chronic obstructive pulmonary disease, unspecified: Secondary | ICD-10-CM

## 2014-12-05 LAB — CBC WITH DIFFERENTIAL/PLATELET
BASOS ABS: 0 10*3/uL (ref 0.0–0.1)
Basophils Relative: 0.1 % (ref 0.0–3.0)
EOS ABS: 0.1 10*3/uL (ref 0.0–0.7)
Eosinophils Relative: 1.1 % (ref 0.0–5.0)
HCT: 44.9 % (ref 39.0–52.0)
Hemoglobin: 14.6 g/dL (ref 13.0–17.0)
LYMPHS ABS: 1.9 10*3/uL (ref 0.7–4.0)
LYMPHS PCT: 15.6 % (ref 12.0–46.0)
MCHC: 32.4 g/dL (ref 30.0–36.0)
MCV: 92.3 fl (ref 78.0–100.0)
MONOS PCT: 7.8 % (ref 3.0–12.0)
Monocytes Absolute: 1 10*3/uL (ref 0.1–1.0)
NEUTROS PCT: 75.4 % (ref 43.0–77.0)
Neutro Abs: 9.3 10*3/uL — ABNORMAL HIGH (ref 1.4–7.7)
Platelets: 274 10*3/uL (ref 150.0–400.0)
RBC: 4.86 Mil/uL (ref 4.22–5.81)
RDW: 14.4 % (ref 11.5–15.5)
WBC: 12.4 10*3/uL — ABNORMAL HIGH (ref 4.0–10.5)

## 2014-12-05 NOTE — Progress Notes (Signed)
Pre visit review using our clinic review tool, if applicable. No additional management support is needed unless otherwise documented below in the visit note. 

## 2014-12-05 NOTE — Progress Notes (Signed)
Subjective:    Patient ID: [name], male    DOB: [birth year], 81 y.o.   MRN: [id number]  HPI   Pt in today for follow up. He states yesterday afternoon he noticed his breathing improved a lot. Pt has started atrovent, albuterol and flovent.   Pt did start the azithromycin.  No fever, no chills, or sweats.  No wheezing, no sob. Rare cough. And states able to sleep well.   Review of Systems  Constitutional: Negative for fever, chills and fatigue.  Respiratory: Positive for cough. Negative for choking, chest tightness, shortness of breath and wheezing.        Rare cough. Able to sleep.  Cardiovascular: Negative for chest pain and palpitations.  Gastrointestinal: Negative for abdominal pain.  Musculoskeletal: Negative for back pain.  Neurological: Negative for dizziness.  Hematological: Negative for adenopathy. Does not bruise/bleed easily.     Past Medical History  Diagnosis Date  . Osteoporosis   . COPD (chronic obstructive pulmonary disease) ([place])   . Coronary artery disease   . Hypertension   . BPH (benign prostatic hyperplasia)   . HLD (hyperlipidemia)   . Pneumonia   . Arthritis     hands   . CVA (cerebral infarction) 3 years ago    mild stroke    Social History   Social History  . Marital Status: Widowed    Spouse Name: N/A  . Number of Children: N/A  . Years of Education: N/A   Occupational History  . Retired    Social History Main Topics  . Smoking status: Former Smoker -- 1.00 packs/day for 35 years    Types: Cigarettes    Quit date: 02/29/1988  . Smokeless tobacco: Never Used  . Alcohol Use: No  . Drug Use: No  . Sexual Activity: Not on file   Other Topics Concern  . Not on file   Social History Narrative    Past Surgical History  Procedure Laterality Date  . Back surgery    . Laparoscopic appendectomy N/A 11/02/2013    Procedure: APPENDECTOMY LAPAROSCOPIC;  Surgeon: Donnie Mesa, MD;  Location: [facility];  Service: General;   Laterality: N/A;    Family History  Problem Relation Age of Onset  . Colon cancer Brother   .       No Known Allergies  Current Outpatient Prescriptions on File Prior to Visit  Medication Sig Dispense Refill  . albuterol (PROVENTIL HFA;VENTOLIN HFA) 108 (90 BASE) MCG/ACT inhaler Inhale 2 puffs into the lungs every 6 (six) hours as needed for wheezing or shortness of breath. 1 Inhaler 2  . alendronate (FOSAMAX) 70 MG tablet TAKE 1 TABLET (70 MG TOTAL) BY MOUTH EVERY SEVEN   (SEVEN) DAYS. TAKE WITH A FULL GLASS OF WATER ON AT NOON TIME   EMPTY STOMACH. 4 tablet 12  . aspirin EC 81 MG tablet Take 81 mg by mouth daily.    [name] atorvastatin (LIPITOR) 20 MG tablet Take 1 tablet (20 mg total) by mouth daily. 90 tablet 3  . azithromycin (ZITHROMAX) 250 MG tablet Take 2 tablets by mouth on day 1, followed by 1 tablet by mouth daily for 4 days. 6 tablet 0  . calcium carbonate (OS-CAL) 600 MG TABS tablet Take 600 mg by mouth daily with breakfast.    . docusate sodium (COLACE) 100 MG capsule Take 100 mg by mouth as needed for mild constipation.    . fluticasone (FLOVENT HFA) 110 MCG/ACT inhaler Inhale 2 puffs into the  lungs 2 (two) times daily. 1 Inhaler 12  . hydrocortisone 1 % lotion Apply 1 application topically 2 (two) times daily.    [name] ipratropium (ATROVENT HFA) 17 MCG/ACT inhaler Inhale 2 puffs into the lungs every 6 (six) hours as needed for wheezing. 1 Inhaler 12  . predniSONE (DELTASONE) 20 MG tablet 1 tab po tid day 1, 1 tab po bid day 2, 1 tab po day 3. 6 tablet 0  . valsartan (DIOVAN) 80 MG tablet Take 1 tablet (80 mg total) by mouth daily. 90 tablet 1   No current facility-administered medications on file prior to visit.    BP 106/78 mmHg  Pulse 61  Temp(Src) 98.2 F (36.8 C) (Oral)  Ht 5\' 8"  (1.727 m)  Wt 145 lb 12.8 oz (66.134 kg)  BMI 22.17 kg/m2  SpO2 97%       Objective:   Physical Exam   General  Mental Status - Alert. General Appearance - Well groomed. Not in acute  distress.  Skin Rashes- No Rashes.  HEENT Head- Normal. Ear Auditory Canal - Left- Normal. Right - Normal.Tympanic Membrane- Left- Normal. Right- Normal. Eye Sclera/Conjunctiva- Left- Normal. Right- Normal. Nose & Sinuses Nasal Mucosa- Left-  Not boggy or Congested. Right-  Not  boggy or Congested. Mouth & Throat Lips: Upper Lip- Normal: no dryness, cracking, pallor, cyanosis, or vesicular eruption. Lower Lip-Normal: no dryness, cracking, pallor, cyanosis or vesicular eruption. Buccal Mucosa- Bilateral- No Aphthous ulcers. Oropharynx- No Discharge or Erythema. Tonsils: Characteristics- Bilateral- No Erythema or Congestion. Size/Enlargement- Bilateral- No enlargement. Discharge- bilateral-None.  Neck Neck- Supple. No Masses.   Chest and Lung Exam Auscultation: Breath Sounds:- even and unlabored. But  shallow respiration. No rhonchi or wheezing.  Cardiovascular Auscultation:Rythm- Regular, rate and rhythm. Murmurs & Other Heart Sounds:Ausculatation of the heart reveal- No Murmurs.  Lymphatic Head & Neck General Head & Neck Lymphatics: Bilateral: Description- No Localized lymphadenopathy.      Assessment & Plan:  Much improved today clinically. o2 Sat is good. Advise continue his 3 inhalers and zithrmax.  Will get repeat cbc to follow his wbc which were high.  Pt did not take oral prednisone. That rx is still on hold. After we get lab results will call pt and advise on whether or not prednisone will be option over weekend if he worsens again.  Follow up in 7 days or as needed

## 2014-12-05 NOTE — Patient Instructions (Addendum)
Much improved today clinically. o2 Sat is good. Advise continue his 3 inhalers and zithrmax.  Will get repeat cbc to follow his wbc which were high.  Pt did not take oral prednisone. That rx is still on hold. After we get lab results will call pt and advise on whether or not prednisone will be option over weekend if he worsens again.  Follow up in 7 days or as needed

## 2015-01-14 ENCOUNTER — Other Ambulatory Visit (HOSPITAL_COMMUNITY): Payer: Medicare Other

## 2015-01-14 ENCOUNTER — Other Ambulatory Visit (HOSPITAL_COMMUNITY): Payer: Self-pay | Admitting: Internal Medicine

## 2015-01-14 DIAGNOSIS — R918 Other nonspecific abnormal finding of lung field: Secondary | ICD-10-CM

## 2015-01-21 ENCOUNTER — Other Ambulatory Visit: Payer: Self-pay | Admitting: Endocrinology

## 2015-01-21 ENCOUNTER — Other Ambulatory Visit: Payer: Self-pay | Admitting: Family Medicine

## 2015-01-21 NOTE — Telephone Encounter (Signed)
Medication filled to pharmacy as requested.   

## 2015-04-16 ENCOUNTER — Ambulatory Visit (INDEPENDENT_AMBULATORY_CARE_PROVIDER_SITE_OTHER): Payer: Medicare Other | Admitting: Family Medicine

## 2015-04-16 ENCOUNTER — Encounter: Payer: Self-pay | Admitting: Family Medicine

## 2015-04-16 ENCOUNTER — Encounter: Payer: Self-pay | Admitting: General Practice

## 2015-04-16 VITALS — BP 132/88 | HR 105 | Temp 97.7°F | Ht 68.0 in | Wt 151.0 lb

## 2015-04-16 DIAGNOSIS — E785 Hyperlipidemia, unspecified: Secondary | ICD-10-CM

## 2015-04-16 DIAGNOSIS — I10 Essential (primary) hypertension: Secondary | ICD-10-CM | POA: Diagnosis not present

## 2015-04-16 DIAGNOSIS — J984 Other disorders of lung: Secondary | ICD-10-CM

## 2015-04-16 LAB — HEPATIC FUNCTION PANEL
ALBUMIN: 4.4 g/dL (ref 3.5–5.2)
ALK PHOS: 75 U/L (ref 39–117)
ALT: 20 U/L (ref 0–53)
AST: 20 U/L (ref 0–37)
Bilirubin, Direct: 0.2 mg/dL (ref 0.0–0.3)
TOTAL PROTEIN: 6.7 g/dL (ref 6.0–8.3)
Total Bilirubin: 0.8 mg/dL (ref 0.2–1.2)

## 2015-04-16 LAB — CBC WITH DIFFERENTIAL/PLATELET
BASOS ABS: 0.1 10*3/uL (ref 0.0–0.1)
Basophils Relative: 0.4 % (ref 0.0–3.0)
EOS PCT: 1.6 % (ref 0.0–5.0)
Eosinophils Absolute: 0.2 10*3/uL (ref 0.0–0.7)
HEMATOCRIT: 49 % (ref 39.0–52.0)
HEMOGLOBIN: 16.2 g/dL (ref 13.0–17.0)
LYMPHS PCT: 27.7 % (ref 12.0–46.0)
Lymphs Abs: 4 10*3/uL (ref 0.7–4.0)
MCHC: 33.1 g/dL (ref 30.0–36.0)
MCV: 91.2 fl (ref 78.0–100.0)
MONOS PCT: 9.1 % (ref 3.0–12.0)
Monocytes Absolute: 1.3 10*3/uL — ABNORMAL HIGH (ref 0.1–1.0)
Neutro Abs: 8.9 10*3/uL — ABNORMAL HIGH (ref 1.4–7.7)
Neutrophils Relative %: 61.2 % (ref 43.0–77.0)
Platelets: 244 10*3/uL (ref 150.0–400.0)
RBC: 5.37 Mil/uL (ref 4.22–5.81)
RDW: 14.4 % (ref 11.5–15.5)
WBC: 14.6 10*3/uL — AB (ref 4.0–10.5)

## 2015-04-16 LAB — LIPID PANEL
CHOLESTEROL: 102 mg/dL (ref 0–200)
HDL: 37.3 mg/dL — AB (ref >39.00)
LDL CALC: 55 mg/dL (ref 0–99)
NonHDL: 64.57
Total CHOL/HDL Ratio: 3
Triglycerides: 47 mg/dL (ref 0.0–149.0)
VLDL: 9.4 mg/dL (ref 0.0–40.0)

## 2015-04-16 LAB — BASIC METABOLIC PANEL
BUN: 21 mg/dL (ref 6–23)
CALCIUM: 9.5 mg/dL (ref 8.4–10.5)
CO2: 30 meq/L (ref 19–32)
CREATININE: 0.91 mg/dL (ref 0.40–1.50)
Chloride: 103 mEq/L (ref 96–112)
GFR: 84.89 mL/min (ref >60.00)
GLUCOSE: 112 mg/dL — AB (ref 70–99)
Potassium: 4.8 mEq/L (ref 3.5–5.1)
SODIUM: 139 meq/L (ref 135–145)

## 2015-04-16 MED ORDER — VALSARTAN 80 MG PO TABS: ORAL_TABLET | ORAL | Status: DC

## 2015-04-16 NOTE — Assessment & Plan Note (Signed)
Chronic problem.  Adequate control.  Asymptomatic.  Refill provided.  Check labs.  No anticipated med changes.

## 2015-04-16 NOTE — Progress Notes (Signed)
   Subjective:    Patient ID: Tomi Bamberger, male    DOB: [birth year], 82 y.o.   MRN: [id number]  HPI HTN- chronic problem, on Valsartan daily w/ adequate control.  No CP, intermittent SOB but not above baseline.  No HAs, visual changes, edema.  Hyperlipidemia- chronic problem, on Atorvastatin.  No abd pain, N/V.  COPD- pt is confused about his inhalers.  Review of Systems For ROS see HPI     Objective:   Physical Exam  Constitutional: He is oriented to person, place, and time. He appears well-developed and well-nourished. No distress.  HENT:  Head: Normocephalic and atraumatic.  Eyes: Conjunctivae and EOM are normal. Pupils are equal, round, and reactive to light.  Neck: Normal range of motion. Neck supple. No thyromegaly present.  Cardiovascular: Normal rate, regular rhythm, normal heart sounds and intact distal pulses.   No murmur heard. Pulmonary/Chest: Effort normal and breath sounds normal. No respiratory distress.  Abdominal: Soft. Bowel sounds are normal. He exhibits no distension.  Musculoskeletal: He exhibits no edema.  Lymphadenopathy:    He has no cervical adenopathy.  Neurological: He is alert and oriented to person, place, and time. No cranial nerve deficit.  Skin: Skin is warm and dry.  Psychiatric: He has a normal mood and affect. His behavior is normal.  Vitals reviewed.         Assessment & Plan:

## 2015-04-16 NOTE — Assessment & Plan Note (Signed)
Ongoing issue for pt.  He has multiple inhalers in office today and is confused by which ones to take.  I labeled each inhaler as daily or rescue and rubber banded the duplicate inhalers together.  Pt feels much more comfortable w/ this.

## 2015-04-16 NOTE — Progress Notes (Signed)
Pre visit review using our clinic review tool, if applicable. No additional management support is needed unless otherwise documented below in the visit note. 

## 2015-04-16 NOTE — Patient Instructions (Signed)
Schedule your complete physical in 6 months We'll notify you of your lab results and make any changes if needed Keep up the good work on healthy diet- you look great!!! Call with any questions or concerns If you want to join [place] at the new [place] office, any scheduled appointments will automatically transfer and we will see you at [phone] [place] [address] [place], [place], [place] [address] ([place]) Have a great weekend!

## 2015-04-16 NOTE — Assessment & Plan Note (Signed)
Chronic problem.  Tolerating statin w/o difficulty.  Check labs.  Adjust meds prn  

## 2015-07-22 ENCOUNTER — Other Ambulatory Visit: Payer: Self-pay | Admitting: General Practice

## 2015-07-22 MED ORDER — FLUTICASONE PROPIONATE HFA 110 MCG/ACT IN AERO: 2.0000 | INHALATION_SPRAY | Freq: Two times a day (BID) | RESPIRATORY_TRACT | Status: DC

## 2015-10-05 ENCOUNTER — Other Ambulatory Visit: Payer: Self-pay | Admitting: Family Medicine

## 2015-10-21 ENCOUNTER — Ambulatory Visit (INDEPENDENT_AMBULATORY_CARE_PROVIDER_SITE_OTHER): Payer: Medicare Other | Admitting: Family Medicine

## 2015-10-21 ENCOUNTER — Encounter: Payer: Self-pay | Admitting: Family Medicine

## 2015-10-21 VITALS — BP 127/86 | HR 88 | Temp 98.0°F | Resp 16 | Ht 68.0 in | Wt 151.1 lb

## 2015-10-21 DIAGNOSIS — Z0000 Encounter for general adult medical examination without abnormal findings: Secondary | ICD-10-CM | POA: Diagnosis not present

## 2015-10-21 DIAGNOSIS — I10 Essential (primary) hypertension: Secondary | ICD-10-CM

## 2015-10-21 DIAGNOSIS — J449 Chronic obstructive pulmonary disease, unspecified: Secondary | ICD-10-CM

## 2015-10-21 DIAGNOSIS — M810 Age-related osteoporosis without current pathological fracture: Secondary | ICD-10-CM | POA: Diagnosis not present

## 2015-10-21 DIAGNOSIS — Z125 Encounter for screening for malignant neoplasm of prostate: Secondary | ICD-10-CM | POA: Diagnosis not present

## 2015-10-21 DIAGNOSIS — E785 Hyperlipidemia, unspecified: Secondary | ICD-10-CM | POA: Diagnosis not present

## 2015-10-21 DIAGNOSIS — Z23 Encounter for immunization: Secondary | ICD-10-CM | POA: Diagnosis not present

## 2015-10-21 LAB — LIPID PANEL
Cholesterol: 128 mg/dL (ref 0–200)
HDL: 45.3 mg/dL (ref >39.00)
LDL Cholesterol: 69 mg/dL (ref 0–99)
NONHDL: 82.48
Total CHOL/HDL Ratio: 3
Triglycerides: 67 mg/dL (ref 0.0–149.0)
VLDL: 13.4 mg/dL (ref 0.0–40.0)

## 2015-10-21 LAB — CBC WITH DIFFERENTIAL/PLATELET
BASOS PCT: 0.4 % (ref 0.0–3.0)
Basophils Absolute: 0.1 10*3/uL (ref 0.0–0.1)
EOS ABS: 0.2 10*3/uL (ref 0.0–0.7)
Eosinophils Relative: 1.4 % (ref 0.0–5.0)
HCT: 49.3 % (ref 39.0–52.0)
Hemoglobin: 16.3 g/dL (ref 13.0–17.0)
Lymphocytes Relative: 14.4 % (ref 12.0–46.0)
Lymphs Abs: 1.9 10*3/uL (ref 0.7–4.0)
MCHC: 33.1 g/dL (ref 30.0–36.0)
MCV: 92.1 fl (ref 78.0–100.0)
MONO ABS: 1 10*3/uL (ref 0.1–1.0)
Monocytes Relative: 7.6 % (ref 3.0–12.0)
NEUTROS ABS: 9.8 10*3/uL — AB (ref 1.4–7.7)
Neutrophils Relative %: 76.2 % (ref 43.0–77.0)
PLATELETS: 218 10*3/uL (ref 150.0–400.0)
RBC: 5.36 Mil/uL (ref 4.22–5.81)
RDW: 14.9 % (ref 11.5–15.5)
WBC: 12.9 10*3/uL — ABNORMAL HIGH (ref 4.0–10.5)

## 2015-10-21 LAB — BASIC METABOLIC PANEL
BUN: 14 mg/dL (ref 6–23)
CHLORIDE: 104 meq/L (ref 96–112)
CO2: 29 meq/L (ref 19–32)
CREATININE: 0.77 mg/dL (ref 0.40–1.50)
Calcium: 9.3 mg/dL (ref 8.4–10.5)
GFR: 102.81 mL/min (ref >60.00)
Glucose, Bld: 94 mg/dL (ref 70–99)
Potassium: 4.4 mEq/L (ref 3.5–5.1)
SODIUM: 141 meq/L (ref 135–145)

## 2015-10-21 LAB — TSH: TSH: 1.24 u[IU]/mL (ref 0.35–4.50)

## 2015-10-21 LAB — HEPATIC FUNCTION PANEL
ALK PHOS: 78 U/L (ref 39–117)
ALT: 21 U/L (ref 0–53)
AST: 18 U/L (ref 0–37)
Albumin: 4.4 g/dL (ref 3.5–5.2)
BILIRUBIN DIRECT: 0.2 mg/dL (ref 0.0–0.3)
BILIRUBIN TOTAL: 0.9 mg/dL (ref 0.2–1.2)
Total Protein: 6.3 g/dL (ref 6.0–8.3)

## 2015-10-21 LAB — VITAMIN D 25 HYDROXY (VIT D DEFICIENCY, FRACTURES): VITD: 37.11 ng/mL (ref 30.00–100.00)

## 2015-10-21 LAB — PSA, MEDICARE: PSA: 3.01 ng/mL (ref 0.10–4.00)

## 2015-10-21 NOTE — Assessment & Plan Note (Signed)
Chronic problem.  Tolerating statin w/o difficulty.  Check labs.  Adjust meds prn  

## 2015-10-21 NOTE — Patient Instructions (Signed)
Follow up in 6 months to recheck BP and cholesterol We'll notify you of your lab results and make any changes if needed Keep up the good work!  You look great! We'll call you with your bone density appt You do not need another colonoscopy Your Pneumovax was given today and you are now up to date- yay! Call with any questions or concerns Happy Early Birthday!!!

## 2015-10-21 NOTE — Assessment & Plan Note (Signed)
Chronic problem.  Well controlled.  Asymptomatic.  Check labs.  No anticipated med changes.  Will follow. 

## 2015-10-21 NOTE — Progress Notes (Signed)
Pre visit review using our clinic review tool, if applicable. No additional management support is needed unless otherwise documented below in the visit note. 

## 2015-10-21 NOTE — Assessment & Plan Note (Signed)
Chronic problem.  Currently asymptomatic on current inhalers.  Following w/ Dr Melvyn Novas.  Will follow along and assist as able

## 2015-10-21 NOTE — Assessment & Plan Note (Signed)
Pt's PE unchanged from previous and WNL for age.  + kyphosis.  UTD on colonoscopy- no need for another.  Pt declines GU exam today- will get PSA level.  Due for DEXA- order entered.  Written screening schedule updated and given to pt.  Pneumovax given.  Check labs.  Anticipatory guidance provided.

## 2015-10-21 NOTE — Addendum Note (Signed)
Addended by: Davis Gourd on: 10/21/2015 08:47 AM   Modules accepted: Orders

## 2015-10-21 NOTE — Assessment & Plan Note (Signed)
Chronic problem.  Currently on weekly Fosamax.  Exercising regularly.  Due for repeat DEXA this fall- order in

## 2015-10-21 NOTE — Progress Notes (Signed)
   Subjective:    Patient ID: [name], male    DOB: 11/25/1931, 82 y.o.   MRN: [id number]  HPI Here today for CPE.  Risk Factors: HTN- chronic problem, on Valsartan 80mg  daily.  Good control.  Denies CP, SOB, HAs, visual changes, edema. Hyperlipidemia- chronic problem, on Lipitor daily.  Denies abd pain, N/V, myalgias COPD- chronic problem, on Flovent, Atrovent, Albuterol.  Denies current cough, wheeze, SOB. Osteoporosis- chronic problem, on Fosamax weekly.  Due for DEXA- seeing Dr Loanne Drilling.  Walking regularly Physical Activity: walking regularly Fall Risk: low Depression: denies current sxs Hearing: normal to conversational tones, mildly decreased to whispered voice ADL's: independent Cognitive: normal linear thought process, memory and attention intact Home Safety: safe at home, lives w/ son Height, Weight, BMI, Visual Acuity: see vitals, vision corrected to 20/20 w/ glasses Counseling: UTD on colonoscopy- no need for another, UTD on Prevnar, due for Pneumovax Care team reviewed and updated Labs Ordered: See A&P Care Plan: See A&P    Review of Systems Patient reports no vision/hearing changes, anorexia, fever ,adenopathy, persistant/recurrent hoarseness, swallowing issues, chest pain, palpitations, edema, persistant/recurrent cough, hemoptysis, dyspnea (rest,exertional, paroxysmal nocturnal), gastrointestinal  bleeding (melena, rectal bleeding), abdominal pain, excessive heart burn, GU symptoms (dysuria, hematuria, voiding/incontinence issues) syncope, focal weakness, memory loss, numbness & tingling, skin/hair/nail changes, depression, anxiety, abnormal bruising/bleeding, musculoskeletal symptoms/signs.     Objective:   Physical Exam General Appearance:    Alert, cooperative, no distress, appears stated age  Head:    Normocephalic, without obvious abnormality, atraumatic  Eyes:    PERRL, conjunctiva/corneas clear, EOM's intact, fundi    benign, both eyes       Ears:     Normal TM's and external ear canals, both ears  Nose:   Nares normal, septum midline, mucosa normal, no drainage   or sinus tenderness  Throat:   Lips, mucosa, and tongue normal; teeth and gums normal  Neck:   Supple, symmetrical, trachea midline, no adenopathy;       thyroid:  No enlargement/tenderness/nodules  Back:     Symmetric, + kyphosis, ROM normal, no CVA tenderness  Lungs:     Clear to auscultation bilaterally, respirations unlabored  Chest wall:    No tenderness or deformity  Heart:    Regular rate and rhythm, S1 and S2 normal, no murmur, rub   or gallop  Abdomen:     Soft, non-tender, bowel sounds active all four quadrants,    no masses, no organomegaly  Genitalia:    Deferred at pt's request  Rectal:    Extremities:   Extremities normal, atraumatic, no cyanosis or edema  Pulses:   2+ and symmetric all extremities  Skin:   Skin color, texture, turgor normal, no rashes or lesions  Lymph nodes:   Cervical, supraclavicular, and axillary nodes normal  Neurologic:   CNII-XII intact. Normal strength, sensation and reflexes      throughout          Assessment & Plan:

## 2015-10-22 ENCOUNTER — Encounter: Payer: Self-pay | Admitting: General Practice

## 2015-11-03 ENCOUNTER — Ambulatory Visit (INDEPENDENT_AMBULATORY_CARE_PROVIDER_SITE_OTHER): Payer: Medicare Other

## 2015-11-03 DIAGNOSIS — Z23 Encounter for immunization: Secondary | ICD-10-CM

## 2015-11-04 ENCOUNTER — Inpatient Hospital Stay: Admission: RE | Admit: 2015-11-04 | Payer: Medicare Other | Source: Ambulatory Visit

## 2015-11-27 ENCOUNTER — Ambulatory Visit (INDEPENDENT_AMBULATORY_CARE_PROVIDER_SITE_OTHER): Payer: Medicare Other | Admitting: Endocrinology

## 2015-11-27 ENCOUNTER — Encounter: Payer: Self-pay | Admitting: Endocrinology

## 2015-11-27 VITALS — BP 118/82 | HR 93 | Ht 68.0 in | Wt 152.0 lb

## 2015-11-27 DIAGNOSIS — M810 Age-related osteoporosis without current pathological fracture: Secondary | ICD-10-CM | POA: Diagnosis not present

## 2015-11-27 LAB — VITAMIN D 25 HYDROXY (VIT D DEFICIENCY, FRACTURES): VITD: 45.26 ng/mL (ref 30.00–100.00)

## 2015-11-27 MED ORDER — ALENDRONATE SODIUM 70 MG PO TABS: 70.0000 mg | ORAL_TABLET | ORAL | 12 refills | Status: DC

## 2015-11-27 NOTE — Progress Notes (Signed)
Subjective:    Patient ID: [name], male    DOB: 04/21/1931, 82 y.o.   MRN: [id number]  HPI Pt returns for f/u of osteoporosis: (fosamax was started in 2015; he has hypogonadism, but rx of this is precluded by BPH; CXR has shown vertebral compression fractures; he has also been rx'ed high-dose ergocalciferol for vit-D deficiency).  Pt says he takes fosamax as rx, and tolerates well.  He seldom has heartburn.   He takes OTC vit-D, but not rx ergocalciferol.  Denies leg cramps.   BPH: no change in chronically decreased urinary stream.  Past Medical History:  Diagnosis Date  . Arthritis    hands   . BPH (benign prostatic hyperplasia)   . COPD (chronic obstructive pulmonary disease) ([place])   . Coronary artery disease   . CVA (cerebral infarction) 3 years ago   mild stroke  . HLD (hyperlipidemia)   . Hypertension   . Osteoporosis   . Pneumonia     Past Surgical History:  Procedure Laterality Date  . BACK SURGERY    . LAPAROSCOPIC APPENDECTOMY N/A 11/02/2013   Procedure: APPENDECTOMY LAPAROSCOPIC;  Surgeon: Donnie Mesa, MD;  Location: [facility];  Service: General;  Laterality: N/A;    Social History   Social History  . Marital status: Widowed    Spouse name: N/A  . Number of children: N/A  . Years of education: N/A   Occupational History  . Retired    Social History Main Topics  . Smoking status: Former Smoker    Packs/day: 1.00    Years: 35.00    Types: Cigarettes    Quit date: 02/29/1988  . Smokeless tobacco: Never Used  . Alcohol use No  . Drug use: No  . Sexual activity: Not on file   Other Topics Concern  . Not on file   Social History Narrative  . No narrative on file    Current Outpatient Prescriptions on File Prior to Visit  Medication Sig Dispense Refill  . aspirin EC 81 MG tablet Take 81 mg by mouth daily.    [name] atorvastatin (LIPITOR) 20 MG tablet TAKE 1 TABLET (20 MG TOTAL) BY MOUTH DAILY. 90 tablet 1  . docusate sodium (COLACE) 100 MG capsule Take  100 mg by mouth as needed for mild constipation. Reported on 04/16/2015    . fluticasone (FLOVENT HFA) 110 MCG/ACT inhaler Inhale 2 puffs into the lungs 2 (two) times daily. 3 Inhaler 1  . ipratropium (ATROVENT HFA) 17 MCG/ACT inhaler Inhale 2 puffs into the lungs every 6 (six) hours as needed for wheezing. 1 Inhaler 12  . valsartan (DIOVAN) 80 MG tablet TAKE 1 TABLET (80 MG TOTAL) BY MOUTH DAILY. 90 tablet 1   No current facility-administered medications on file prior to visit.     No Known Allergies  Family History  Problem Relation Age of Onset  . Colon cancer Brother     BP 118/82   Pulse 93   Ht 5\' 8"  (1.727 m)   Wt 152 lb (68.9 kg)   SpO2 95%   BMI 23.11 kg/m   Review of Systems Denies falls and memory loss.      Objective:   Physical Exam VITAL SIGNS:  See vs page.  GENERAL: no distress.   Chest wall: kyphosis is again noted.   Gait: normal and steady.      Assessment & Plan:  osteoporosis: Due for recheck vit-D deficiency: due for recheck   BPH: stable: this precludes rx  of hypogonadism.

## 2015-11-27 NOTE — Patient Instructions (Addendum)
Please continue the same alendronate. We can't increase the testosterone because of your prostate enlargement.  blood tests are requested for you today.  We'll let you know about the results.  it is critically important to prevent falling down (keep floor areas well-lit, dry, and free of loose objects.  If you have a cane, walker, or wheelchair, you should use it, even for short trips around the house.  Also, try not to rush).   We'll let you know when we get the results of the bone density test.  Please return in 1 year.

## 2015-11-30 LAB — PTH, INTACT AND CALCIUM
CALCIUM: 9.9 mg/dL (ref 8.6–10.3)
PTH: 60 pg/mL (ref 14–64)

## 2015-12-04 ENCOUNTER — Telehealth: Payer: Self-pay | Admitting: General Practice

## 2015-12-04 DIAGNOSIS — L989 Disorder of the skin and subcutaneous tissue, unspecified: Secondary | ICD-10-CM

## 2015-12-04 NOTE — Telephone Encounter (Signed)
Which dermatologist was he seeing before, so I don't schedule him with the same one.  Thanks.

## 2015-12-04 NOTE — Telephone Encounter (Signed)
Last referral from 10/2014 says:   [facility]  [facility] [place] [phone]

## 2015-12-04 NOTE — Telephone Encounter (Signed)
Received a mychart message from pt son requesting the following:   Good Afternoon, My father [name] has a spot on his face and would like a referral to [facility] where I go. Not happy with current dermatologist. Is this something you can do or does he need to come in to see you. Have a great weekend.   This referral was placed today per PCP.

## 2015-12-08 ENCOUNTER — Ambulatory Visit (INDEPENDENT_AMBULATORY_CARE_PROVIDER_SITE_OTHER)
Admission: RE | Admit: 2015-12-08 | Discharge: 2015-12-08 | Disposition: A | Discharge status: Home / Self Care | Payer: Medicare Other | Source: Ambulatory Visit | Attending: Family Medicine | Admitting: Family Medicine

## 2015-12-08 DIAGNOSIS — M810 Age-related osteoporosis without current pathological fracture: Secondary | ICD-10-CM

## 2015-12-09 ENCOUNTER — Encounter: Payer: Self-pay | Admitting: General Practice

## 2015-12-28 ENCOUNTER — Other Ambulatory Visit: Payer: Self-pay | Admitting: Dermatology

## 2015-12-28 DIAGNOSIS — D0439 Carcinoma in situ of skin of other parts of face: Secondary | ICD-10-CM | POA: Diagnosis not present

## 2015-12-28 DIAGNOSIS — D044 Carcinoma in situ of skin of scalp and neck: Secondary | ICD-10-CM | POA: Diagnosis not present

## 2015-12-28 DIAGNOSIS — D239 Other benign neoplasm of skin, unspecified: Secondary | ICD-10-CM | POA: Diagnosis not present

## 2015-12-28 DIAGNOSIS — L570 Actinic keratosis: Secondary | ICD-10-CM | POA: Diagnosis not present

## 2016-02-11 DIAGNOSIS — D044 Carcinoma in situ of skin of scalp and neck: Secondary | ICD-10-CM | POA: Diagnosis not present

## 2016-02-11 DIAGNOSIS — D0439 Carcinoma in situ of skin of other parts of face: Secondary | ICD-10-CM | POA: Diagnosis not present

## 2016-03-23 ENCOUNTER — Encounter: Payer: Self-pay | Admitting: Physician Assistant

## 2016-03-23 ENCOUNTER — Ambulatory Visit (INDEPENDENT_AMBULATORY_CARE_PROVIDER_SITE_OTHER): Payer: Medicare Other | Admitting: Physician Assistant

## 2016-03-23 ENCOUNTER — Ambulatory Visit (HOSPITAL_BASED_OUTPATIENT_CLINIC_OR_DEPARTMENT_OTHER)
Admission: RE | Admit: 2016-03-23 | Discharge: 2016-03-23 | Disposition: A | Discharge status: Home / Self Care | Payer: Medicare Other | Source: Ambulatory Visit | Attending: Physician Assistant | Admitting: Physician Assistant

## 2016-03-23 VITALS — BP 100/64 | HR 98 | Temp 97.6°F | Resp 16 | Ht 68.0 in | Wt 148.0 lb

## 2016-03-23 DIAGNOSIS — J181 Lobar pneumonia, unspecified organism: Secondary | ICD-10-CM | POA: Insufficient documentation

## 2016-03-23 DIAGNOSIS — J189 Pneumonia, unspecified organism: Secondary | ICD-10-CM

## 2016-03-23 DIAGNOSIS — B9689 Other specified bacterial agents as the cause of diseases classified elsewhere: Secondary | ICD-10-CM | POA: Diagnosis not present

## 2016-03-23 DIAGNOSIS — R0602 Shortness of breath: Secondary | ICD-10-CM | POA: Diagnosis not present

## 2016-03-23 DIAGNOSIS — R0989 Other specified symptoms and signs involving the circulatory and respiratory systems: Secondary | ICD-10-CM | POA: Diagnosis not present

## 2016-03-23 DIAGNOSIS — R05 Cough: Secondary | ICD-10-CM | POA: Diagnosis not present

## 2016-03-23 DIAGNOSIS — J208 Acute bronchitis due to other specified organisms: Secondary | ICD-10-CM | POA: Diagnosis not present

## 2016-03-23 MED ORDER — PREDNISONE 10 MG PO TABS: ORAL_TABLET | ORAL | 0 refills | Status: DC

## 2016-03-23 MED ORDER — IPRATROPIUM-ALBUTEROL 0.5-2.5 (3) MG/3ML IN SOLN
3.0000 mL | Freq: Once | RESPIRATORY_TRACT | Status: AC
  Administered 2016-03-23: 3 mL via RESPIRATORY_TRACT

## 2016-03-23 MED ORDER — LEVOFLOXACIN 500 MG PO TABS: 500.0000 mg | ORAL_TABLET | Freq: Every day | ORAL | 0 refills | Status: DC

## 2016-03-23 NOTE — Progress Notes (Signed)
Pre visit review using our clinic review tool, if applicable. No additional management support is needed unless otherwise documented below in the visit note. 

## 2016-03-23 NOTE — Progress Notes (Signed)
Patient presents to clinic today c/o 5 days of chest congestion with cough productive of yellow sputum. Denies fever, chills. Notes some chest tightness and wheeze above baseline COPD. Is using chronic medications as directed. Denies recent travel or sick contact. Has history of recurrent pneumonia.    Past Medical History:  Diagnosis Date  . Arthritis    hands   . BPH (benign prostatic hyperplasia)   . COPD (chronic obstructive pulmonary disease) ([place])   . Coronary artery disease   . CVA (cerebral infarction) 3 years ago   mild stroke  . HLD (hyperlipidemia)   . Hypertension   . Osteoporosis   . Pneumonia     Current Outpatient Prescriptions on File Prior to Visit  Medication Sig Dispense Refill  . alendronate (FOSAMAX) 70 MG tablet Take 1 tablet (70 mg total) by mouth once a week. TAKE WITH A FULL GLASS OF WATER ON AT NOON TIME   EMPTY STOMACH. 4 tablet 12  . aspirin EC 81 MG tablet Take 81 mg by mouth daily.    [name] atorvastatin (LIPITOR) 20 MG tablet TAKE 1 TABLET (20 MG TOTAL) BY MOUTH DAILY. 90 tablet 1  . Cholecalciferol (D3-1000 PO) Take 2,000 Units by mouth.    . docusate sodium (COLACE) 100 MG capsule Take 100 mg by mouth as needed for mild constipation. Reported on 04/16/2015    . fluticasone (FLOVENT HFA) 110 MCG/ACT inhaler Inhale 2 puffs into the lungs 2 (two) times daily. 3 Inhaler 1  . ipratropium (ATROVENT HFA) 17 MCG/ACT inhaler Inhale 2 puffs into the lungs every 6 (six) hours as needed for wheezing. 1 Inhaler 12  . valsartan (DIOVAN) 80 MG tablet TAKE 1 TABLET (80 MG TOTAL) BY MOUTH DAILY. 90 tablet 1   No current facility-administered medications on file prior to visit.     No Known Allergies  Family History  Problem Relation Age of Onset  . Colon cancer Brother     Social History   Social History  . Marital status: Widowed    Spouse name: N/A  . Number of children: N/A  . Years of education: N/A   Occupational History  . Retired    Social History  Main Topics  . Smoking status: Former Smoker    Packs/day: 1.00    Years: 35.00    Types: Cigarettes    Quit date: 02/29/1988  . Smokeless tobacco: Never Used  . Alcohol use No  . Drug use: No  . Sexual activity: Not Asked   Other Topics Concern  . None   Social History Narrative  . None   Review of Systems - See HPI.  All other ROS are negative.  BP 100/64   Pulse 98   Temp 97.6 F (36.4 C) (Oral)   Resp 16   Ht 5\' 8"  (1.727 m)   Wt 148 lb (67.1 kg)   SpO2 96% Comment: RA -- after neb treatment  BMI 22.50 kg/m   Physical Exam  Constitutional: He is oriented to person, place, and time and well-developed, well-nourished, and in no distress.  HENT:  Head: Normocephalic and atraumatic.  Right Ear: External ear normal.  Left Ear: External ear normal.  Nose: Nose normal.  Mouth/Throat: Oropharynx is clear and moist. No oropharyngeal exudate.  TM within normal limits bilaterally.   Eyes: Conjunctivae are normal.  Neck: Neck supple.  Cardiovascular: Normal rate, regular rhythm, normal heart sounds and intact distal pulses.   Pulmonary/Chest: Effort normal. No respiratory distress. He has  wheezes. He has rales. He exhibits no tenderness.  Neurological: He is alert and oriented to person, place, and time.  Skin: Skin is warm and dry. No rash noted.  Psychiatric: Affect normal.  Vitals reviewed.  Assessment/Plan: 1. Acute bacterial bronchitis Duoneb given with increase in O2 from 90% to 96% steady on RA.Wheezing improved. Rales of L lower lung. STAT CXR ordered. Levaquin 500 mg daily x 7 days. Prednisone taper started. Thankfully CXR negative for acute pneumonia. Discussed giving age and history very close follow-up is needed if we are treating outpatient. He will follow-up in the morning with me. Any worsening symptoms, he is to call 911 or have son carry him to ER for assessment. Patient voices understanding and agreement.   - DG Chest 2 View; Future - levofloxacin  (LEVAQUIN) 500 MG tablet; Take 1 tablet (500 mg total) by mouth daily.  Dispense: 7 tablet; Refill: 0 - predniSONE (DELTASONE) 10 MG tablet; Take 3 tablets by mouth x 3 days, then 2 tablets x 3 days, then 1 tablet x 3 days  Dispense: 18 tablet; Refill: 0 - ipratropium-albuterol (DUONEB) 0.5-2.5 (3) MG/3ML nebulizer solution 3 mL; Take 3 mLs by nebulization once.   Leeanne Rio, PA-C

## 2016-03-23 NOTE — Patient Instructions (Addendum)
I am concerned for pneumonia giving your symptoms and examination findings. Oxygen improved with nebulizer treatment.   I am starting you on Levaquin 500 mg daily for suspected pneumonia. Please go directly to [place] for a STAT CXR.  Stay hydrated and continue chronic COPD medications. Start the prednisone as directed. Mucinex as directed for congestion.  Follow-up with me tomorrow morning. If anything worsens before then, please go to the ER of call 911.

## 2016-03-24 ENCOUNTER — Encounter: Payer: Self-pay | Admitting: Physician Assistant

## 2016-03-24 ENCOUNTER — Ambulatory Visit (INDEPENDENT_AMBULATORY_CARE_PROVIDER_SITE_OTHER): Payer: Medicare Other | Admitting: Physician Assistant

## 2016-03-24 VITALS — BP 110/60 | HR 94 | Temp 97.6°F | Resp 16 | Ht 68.0 in | Wt 147.0 lb

## 2016-03-24 DIAGNOSIS — J440 Chronic obstructive pulmonary disease with acute lower respiratory infection: Secondary | ICD-10-CM | POA: Diagnosis not present

## 2016-03-24 DIAGNOSIS — J209 Acute bronchitis, unspecified: Secondary | ICD-10-CM

## 2016-03-24 NOTE — Progress Notes (Signed)
Patient presents to clinic today for 1 day follow-up after starting treatment for bronchitis/CAP. CXR obtained yesterday and negative for acute process but revealed signs of chronic interstitial disease. Patent started on Levaquin and Prednisone. Is taking as directed with chronic COPD medications. Endorses tolerating well. Is feeling better today. Denies any SOB or wheezing since starting prednisone. Denies fever, chills, malaise. Normal appetite.   Past Medical History:  Diagnosis Date  . Arthritis    hands   . BPH (benign prostatic hyperplasia)   . COPD (chronic obstructive pulmonary disease) ([place])   . Coronary artery disease   . CVA (cerebral infarction) 3 years ago   mild stroke  . HLD (hyperlipidemia)   . Hypertension   . Osteoporosis   . Pneumonia     Current Outpatient Prescriptions on File Prior to Visit  Medication Sig Dispense Refill  . alendronate (FOSAMAX) 70 MG tablet Take 1 tablet (70 mg total) by mouth once a week. TAKE WITH A FULL GLASS OF WATER ON AT NOON TIME   EMPTY STOMACH. 4 tablet 12  . aspirin EC 81 MG tablet Take 81 mg by mouth daily.    [name] atorvastatin (LIPITOR) 20 MG tablet TAKE 1 TABLET (20 MG TOTAL) BY MOUTH DAILY. 90 tablet 1  . Cholecalciferol (D3-1000 PO) Take 2,000 Units by mouth.    . docusate sodium (COLACE) 100 MG capsule Take 100 mg by mouth as needed for mild constipation. Reported on 04/16/2015    . fluticasone (FLOVENT HFA) 110 MCG/ACT inhaler Inhale 2 puffs into the lungs 2 (two) times daily. 3 Inhaler 1  . ipratropium (ATROVENT HFA) 17 MCG/ACT inhaler Inhale 2 puffs into the lungs every 6 (six) hours as needed for wheezing. 1 Inhaler 12  . levofloxacin (LEVAQUIN) 500 MG tablet Take 1 tablet (500 mg total) by mouth daily. 7 tablet 0  . predniSONE (DELTASONE) 10 MG tablet Take 3 tablets by mouth x 3 days, then 2 tablets x 3 days, then 1 tablet x 3 days 18 tablet 0  . valsartan (DIOVAN) 80 MG tablet TAKE 1 TABLET (80 MG TOTAL) BY MOUTH DAILY. 90  tablet 1   No current facility-administered medications on file prior to visit.     No Known Allergies  Family History  Problem Relation Age of Onset  . Colon cancer Brother     Social History   Social History  . Marital status: Widowed    Spouse name: N/A  . Number of children: N/A  . Years of education: N/A   Occupational History  . Retired    Social History Main Topics  . Smoking status: Former Smoker    Packs/day: 1.00    Years: 35.00    Types: Cigarettes    Quit date: 02/29/1988  . Smokeless tobacco: Never Used  . Alcohol use No  . Drug use: No  . Sexual activity: Not Asked   Other Topics Concern  . None   Social History Narrative  . None    Review of Systems - See HPI.  All other ROS are negative.  BP 110/60   Pulse 94   Temp 97.6 F (36.4 C) (Oral)   Resp 16   Ht 5\' 8"  (1.727 m)   Wt 147 lb (66.7 kg)   SpO2 94%   BMI 22.35 kg/m   Physical Exam  Constitutional: He is oriented to person, place, and time and well-developed, well-nourished, and in no distress.  HENT:  Head: Normocephalic and atraumatic.  Right Ear:  External ear normal.  Left Ear: External ear normal.  Nose: Nose normal.  Mouth/Throat: Oropharynx is clear and moist. No oropharyngeal exudate.  TM within normal limits  Eyes: Conjunctivae are normal.  Neck: Neck supple.  Cardiovascular: Normal rate, regular rhythm, normal heart sounds and intact distal pulses.   Pulmonary/Chest: Effort normal and breath sounds normal. No respiratory distress. He has no wheezes. He has no rales. He exhibits no tenderness.  Lymphadenopathy:    He has no cervical adenopathy.  Neurological: He is alert and oriented to person, place, and time.  Skin: Skin is warm and dry. No rash noted.  Psychiatric: Affect normal.  Vitals reviewed.  Assessment/Plan: 1. COPD with acute bronchitis ([place]) Patient feeling better today. CXR negative for pneumonia. Tolerating Levaquin and prednisone well. intial 02  yesterday at 90% before neb. Today at 94% which is could. Lungs CTAB today. Afebrile and vitals stable. Will continue outpatient treatment. Continue chronic COPD medications. FU scheduled for Monday giving age and history. Strict ER/911 precautions given as we head towards the weekend. Patient is very compliant with instructions per PCP.     Leeanne Rio, PA-C

## 2016-03-24 NOTE — Progress Notes (Signed)
Pre visit review using our clinic review tool, if applicable. No additional management support is needed unless otherwise documented below in the visit note. 

## 2016-03-24 NOTE — Patient Instructions (Addendum)
I am glad you are feeling better today. Your lungs are sounding better and your oxygen level is better today that it was yesterday before your nebulizer treatment.   Continue antibiotic as directed. Complete course of prednisone. Continue your chronic COPD medications and measures discussed yesterday.   Follow-up Monday in office for a final assessment.   As discussed, if there is any worsening of symptoms or there are not continuing to improve, please go to the ER.

## 2016-03-28 ENCOUNTER — Emergency Department (HOSPITAL_COMMUNITY)
Admission: EM | Admit: 2016-03-28 | Discharge: 2016-03-28 | Disposition: A | Discharge status: Home / Self Care | Payer: Medicare Other | Attending: Emergency Medicine | Admitting: Emergency Medicine

## 2016-03-28 ENCOUNTER — Encounter (HOSPITAL_COMMUNITY): Payer: Self-pay | Admitting: *Deleted

## 2016-03-28 ENCOUNTER — Ambulatory Visit (INDEPENDENT_AMBULATORY_CARE_PROVIDER_SITE_OTHER): Payer: Medicare Other | Admitting: Physician Assistant

## 2016-03-28 ENCOUNTER — Encounter: Payer: Self-pay | Admitting: Physician Assistant

## 2016-03-28 ENCOUNTER — Emergency Department (HOSPITAL_COMMUNITY): Payer: Medicare Other

## 2016-03-28 VITALS — BP 108/60 | HR 117 | Temp 97.6°F | Resp 16 | Ht 68.0 in | Wt 147.0 lb

## 2016-03-28 DIAGNOSIS — Z8673 Personal history of transient ischemic attack (TIA), and cerebral infarction without residual deficits: Secondary | ICD-10-CM | POA: Insufficient documentation

## 2016-03-28 DIAGNOSIS — J40 Bronchitis, not specified as acute or chronic: Secondary | ICD-10-CM | POA: Diagnosis not present

## 2016-03-28 DIAGNOSIS — Z79899 Other long term (current) drug therapy: Secondary | ICD-10-CM | POA: Insufficient documentation

## 2016-03-28 DIAGNOSIS — Z87891 Personal history of nicotine dependence: Secondary | ICD-10-CM | POA: Insufficient documentation

## 2016-03-28 DIAGNOSIS — I10 Essential (primary) hypertension: Secondary | ICD-10-CM | POA: Insufficient documentation

## 2016-03-28 DIAGNOSIS — I2510 Atherosclerotic heart disease of native coronary artery without angina pectoris: Secondary | ICD-10-CM | POA: Diagnosis not present

## 2016-03-28 DIAGNOSIS — Z7982 Long term (current) use of aspirin: Secondary | ICD-10-CM | POA: Insufficient documentation

## 2016-03-28 DIAGNOSIS — R0602 Shortness of breath: Secondary | ICD-10-CM | POA: Diagnosis not present

## 2016-03-28 DIAGNOSIS — R000 Tachycardia, unspecified: Secondary | ICD-10-CM

## 2016-03-28 DIAGNOSIS — R069 Unspecified abnormalities of breathing: Secondary | ICD-10-CM | POA: Diagnosis not present

## 2016-03-28 DIAGNOSIS — J449 Chronic obstructive pulmonary disease, unspecified: Secondary | ICD-10-CM | POA: Insufficient documentation

## 2016-03-28 DIAGNOSIS — R05 Cough: Secondary | ICD-10-CM | POA: Diagnosis present

## 2016-03-28 LAB — CBC WITH DIFFERENTIAL/PLATELET
BASOS PCT: 0 %
Basophils Absolute: 0 10*3/uL (ref 0.0–0.1)
EOS ABS: 0 10*3/uL (ref 0.0–0.7)
EOS PCT: 0 %
HCT: 47.5 % (ref 39.0–52.0)
Hemoglobin: 16.2 g/dL (ref 13.0–17.0)
LYMPHS ABS: 1.3 10*3/uL (ref 0.7–4.0)
Lymphocytes Relative: 9 %
MCH: 30.7 pg (ref 26.0–34.0)
MCHC: 34.1 g/dL (ref 30.0–36.0)
MCV: 90.1 fL (ref 78.0–100.0)
Monocytes Absolute: 0.8 10*3/uL (ref 0.1–1.0)
Monocytes Relative: 5 %
Neutro Abs: 13.1 10*3/uL — ABNORMAL HIGH (ref 1.7–7.7)
Neutrophils Relative %: 86 %
PLATELETS: 285 10*3/uL (ref 150–400)
RBC: 5.27 MIL/uL (ref 4.22–5.81)
RDW: 14.2 % (ref 11.5–15.5)
WBC: 15.2 10*3/uL — AB (ref 4.0–10.5)

## 2016-03-28 LAB — COMPREHENSIVE METABOLIC PANEL
ALBUMIN: 3.5 g/dL (ref 3.5–5.0)
ALT: 68 U/L — AB (ref 17–63)
ANION GAP: 13 (ref 5–15)
AST: 43 U/L — ABNORMAL HIGH (ref 15–41)
Alkaline Phosphatase: 53 U/L (ref 38–126)
BUN: 19 mg/dL (ref 6–20)
CHLORIDE: 102 mmol/L (ref 101–111)
CO2: 25 mmol/L (ref 22–32)
Calcium: 9.4 mg/dL (ref 8.9–10.3)
Creatinine, Ser: 1 mg/dL (ref 0.61–1.24)
GFR calc non Af Amer: >60 mL/min (ref >60)
Glucose, Bld: 91 mg/dL (ref 65–99)
Potassium: 4.3 mmol/L (ref 3.5–5.1)
SODIUM: 140 mmol/L (ref 135–145)
Total Bilirubin: 0.9 mg/dL (ref 0.3–1.2)
Total Protein: 5.7 g/dL — ABNORMAL LOW (ref 6.5–8.1)

## 2016-03-28 LAB — I-STAT TROPONIN, ED: Troponin i, poc: 0 ng/mL (ref 0.00–0.08)

## 2016-03-28 LAB — I-STAT CG4 LACTIC ACID, ED: LACTIC ACID, VENOUS: 1.73 mmol/L (ref 0.5–1.9)

## 2016-03-28 NOTE — Progress Notes (Signed)
Patient presents to clinic today for follow-up of acute bronchitis and COPD. Patient taking Levaquin and prednisone as directed. At follow-up on Friday, patient was feeling much better. States symptoms worsened over the weekend. Notes increased work of breathing. Denies fever. Still having cough. Denies chest pain, palpitations, lightheadedness or dizziness.   Past Medical History:  Diagnosis Date  . Arthritis    hands   . BPH (benign prostatic hyperplasia)   . COPD (chronic obstructive pulmonary disease) ([place])   . Coronary artery disease   . CVA (cerebral infarction) 3 years ago   mild stroke  . HLD (hyperlipidemia)   . Hypertension   . Osteoporosis   . Pneumonia     Current Outpatient Prescriptions on File Prior to Visit  Medication Sig Dispense Refill  . alendronate (FOSAMAX) 70 MG tablet Take 1 tablet (70 mg total) by mouth once a week. TAKE WITH A FULL GLASS OF WATER ON AT NOON TIME   EMPTY STOMACH. 4 tablet 12  . aspirin EC 81 MG tablet Take 81 mg by mouth daily.    [name] atorvastatin (LIPITOR) 20 MG tablet TAKE 1 TABLET (20 MG TOTAL) BY MOUTH DAILY. 90 tablet 1  . Cholecalciferol (D3-1000 PO) Take 2,000 Units by mouth.    . docusate sodium (COLACE) 100 MG capsule Take 100 mg by mouth as needed for mild constipation. Reported on 04/16/2015    . fluticasone (FLOVENT HFA) 110 MCG/ACT inhaler Inhale 2 puffs into the lungs 2 (two) times daily. 3 Inhaler 1  . ipratropium (ATROVENT HFA) 17 MCG/ACT inhaler Inhale 2 puffs into the lungs every 6 (six) hours as needed for wheezing. 1 Inhaler 12  . levofloxacin (LEVAQUIN) 500 MG tablet Take 1 tablet (500 mg total) by mouth daily. 7 tablet 0  . predniSONE (DELTASONE) 10 MG tablet Take 3 tablets by mouth x 3 days, then 2 tablets x 3 days, then 1 tablet x 3 days 18 tablet 0  . valsartan (DIOVAN) 80 MG tablet TAKE 1 TABLET (80 MG TOTAL) BY MOUTH DAILY. 90 tablet 1   No current facility-administered medications on file prior to visit.     No  Known Allergies  Family History  Problem Relation Age of Onset  . Colon cancer Brother     Social History   Social History  . Marital status: Widowed    Spouse name: N/A  . Number of children: N/A  . Years of education: N/A   Occupational History  . Retired    Social History Main Topics  . Smoking status: Former Smoker    Packs/day: 1.00    Years: 35.00    Types: Cigarettes    Quit date: 02/29/1988  . Smokeless tobacco: Never Used  . Alcohol use No  . Drug use: No  . Sexual activity: Not Asked   Other Topics Concern  . None   Social History Narrative  . None    Review of Systems - See HPI.  All other ROS are negative.  BP 108/60   Pulse (!) 117   Temp 97.6 F (36.4 C) (Oral)   Resp 16   Ht 5\' 8"  (1.727 m)   Wt 147 lb (66.7 kg)   SpO2 93%   BMI 22.35 kg/m   Physical Exam  Constitutional: He is oriented to person, place, and time.  HENT:  Head: Normocephalic and atraumatic.  Right Ear: External ear normal.  Left Ear: External ear normal.  Nose: Nose normal.  Mouth/Throat: Oropharynx is clear and  moist. No oropharyngeal exudate.  TM within normal limits.  Eyes: Conjunctivae are normal.  Neck: Neck supple.  Cardiovascular: Regular rhythm, normal heart sounds and intact distal pulses.  Tachycardia present.   Pulmonary/Chest: Accessory muscle usage present. Tachypnea noted. No respiratory distress. He has no wheezes. He has no rales. He exhibits no tenderness.  Lymphadenopathy:    He has no cervical adenopathy.  Neurological: He is alert and oriented to person, place, and time.  Skin: Skin is warm and dry.  Psychiatric: Affect normal.   Assessment/Plan: 1. Tachycardia And mild tachypnea in 83 year old with CAD and hx of COPD and stroke, currently treated for bronchitis and mild COPD exacerbation. Was doing better at follow-up last week. Clearly something has changed. EKG reveals sinus tachycardia with old infarction. Patient O2 sats averaging 94-97% RA  with pulse 110-130 and RR 16-24. Accessory muscle usage noted. EMS contacted. Patient transported to hospital via EMS for more acute assessment.   - EKG 12-Lead   Leeanne Rio, PA-C

## 2016-03-28 NOTE — ED Provider Notes (Signed)
[facility] DEPT Provider Note   CSN: [id number] Arrival date & time: 03/28/16  1107     History   Chief Complaint Chief Complaint  Patient presents with  . Cough    HPI [name] is a 83 y.o. male.  Patient is an 83 year old male with a history of COPD, CAD, CVA and hypertension presenting today with 8 days of worsening cough and shortness of breath. Patient states on Monday last week he developed a cough with minimal sputum. By Thursday he was developing shortness of breath with exertion. He saw his doctor and was started on an antibiotic and prednisone as well as an inhaler. He saw his doctor again on Friday and felt slightly better. Over the weekend he was doing okay but when he woke up this morning he was more short of breath and feeling worse. He went to his doctor's office and at that time was found to have an elevated heart rate and was sent here for further evaluation. He last used his inhaler this morning before going to the doctor. He denies any fever. No chest pain, lower extremity edema.   The history is provided by the patient and medical records.  Cough  This is a new problem. The current episode started more than 1 week ago. The problem occurs constantly. The problem has been gradually worsening. The cough is productive of sputum. There has been no fever. Associated symptoms include shortness of breath. Pertinent negatives include no chest pain, no sore throat and no wheezing. The treatment provided no relief. He is not a smoker. His past medical history is significant for COPD. Past medical history comments: COPD.    Past Medical History:  Diagnosis Date  . Arthritis    hands   . BPH (benign prostatic hyperplasia)   . COPD (chronic obstructive pulmonary disease) ([place])   . Coronary artery disease   . CVA (cerebral infarction) 3 years ago   mild stroke  . HLD (hyperlipidemia)   . Hypertension   . Osteoporosis   . Pneumonia     Patient Active Problem  List   Diagnosis Date Noted  . Physical exam 10/08/2014  . Facial skin lesion 04/08/2014  . COPD (chronic obstructive pulmonary disease) ([place]) 12/04/2013  . Elevated WBCs 11/22/2013  . Acute appendicitis 11/02/2013  . HTN (hypertension) 10/15/2013  . Osteoporosis 10/15/2013  . History of CVA (cerebrovascular accident) 10/15/2013  . Compression fracture of lumbar vertebra ([place]) 10/15/2013  . Recurrent pneumonia 10/15/2013  . Hyperlipidemia 10/15/2013  . Low testosterone 10/15/2013  . Carotid stenosis 10/15/2013    Past Surgical History:  Procedure Laterality Date  . BACK SURGERY    . LAPAROSCOPIC APPENDECTOMY N/A 11/02/2013   Procedure: APPENDECTOMY LAPAROSCOPIC;  Surgeon: Donnie Mesa, MD;  Location: [facility];  Service: General;  Laterality: N/A;       Home Medications    Prior to Admission medications   Medication Sig Start Date End Date Taking? Authorizing Provider  alendronate (FOSAMAX) 70 MG tablet Take 1 tablet (70 mg total) by mouth once a week. TAKE WITH A FULL GLASS OF WATER ON AT NOON TIME   EMPTY STOMACH. 11/27/15   Renato Shin, MD  aspirin EC 81 MG tablet Take 81 mg by mouth daily.    Historical Provider, MD  atorvastatin (LIPITOR) 20 MG tablet TAKE 1 TABLET (20 MG TOTAL) BY MOUTH DAILY. 10/05/15   [name], MD  Cholecalciferol (D3-1000 PO) Take 2,000 Units by mouth.    Historical Provider,  MD  docusate sodium (COLACE) 100 MG capsule Take 100 mg by mouth as needed for mild constipation. Reported on 04/16/2015    Historical Provider, MD  fluticasone (FLOVENT HFA) 110 MCG/ACT inhaler Inhale 2 puffs into the lungs 2 (two) times daily. 07/22/15   [name], MD  ipratropium (ATROVENT HFA) 17 MCG/ACT inhaler Inhale 2 puffs into the lungs every 6 (six) hours as needed for wheezing. 12/03/14   Percell Miller Saguier, PA-C  levofloxacin (LEVAQUIN) 500 MG tablet Take 1 tablet (500 mg total) by mouth daily. 03/23/16   Brunetta Jeans, PA-C  predniSONE (DELTASONE) 10 MG tablet  Take 3 tablets by mouth x 3 days, then 2 tablets x 3 days, then 1 tablet x 3 days 03/23/16   Brunetta Jeans, PA-C  valsartan (DIOVAN) 80 MG tablet TAKE 1 TABLET (80 MG TOTAL) BY MOUTH DAILY. 10/05/15   [name], MD    Family History Family History  Problem Relation Age of Onset  . Colon cancer Brother     Social History Social History  Substance Use Topics  . Smoking status: Former Smoker    Packs/day: 1.00    Years: 35.00    Types: Cigarettes    Quit date: 02/29/1988  . Smokeless tobacco: Never Used  . Alcohol use No     Allergies   Patient has no known allergies.   Review of Systems Review of Systems  HENT: Negative for sore throat.   Respiratory: Positive for cough and shortness of breath. Negative for wheezing.   Cardiovascular: Negative for chest pain.  All other systems reviewed and are negative.    Physical Exam Updated Vital Signs BP (!) 130/107 (BP Location: Right Arm)   Pulse 106   Temp 97.6 F (36.4 C) (Oral)   SpO2 95%   Physical Exam  Constitutional: He is oriented to person, place, and time. He appears well-developed and well-nourished. No distress.  HENT:  Head: Normocephalic and atraumatic.  Mouth/Throat: Oropharynx is clear and moist.  Eyes: Conjunctivae and EOM are normal. Pupils are equal, round, and reactive to light.  Neck: Normal range of motion. Neck supple.  Cardiovascular: Regular rhythm and intact distal pulses.  Tachycardia present.   No murmur heard. Pulmonary/Chest: Effort normal. Tachypnea noted. No respiratory distress. He has no wheezes. He has rhonchi in the left middle field and the left lower field. He has no rales.  Abdominal: Soft. He exhibits no distension. There is no tenderness. There is no rebound and no guarding.  Musculoskeletal: Normal range of motion. He exhibits no edema or tenderness.  Neurological: He is alert and oriented to person, place, and time.  Skin: Skin is warm and dry. No rash noted. No erythema.   Psychiatric: He has a normal mood and affect. His behavior is normal.  Nursing note and vitals reviewed.    ED Treatments / Results  Labs (all labs ordered are listed, but only abnormal results are displayed) Labs Reviewed  CBC WITH DIFFERENTIAL/PLATELET - Abnormal; Notable for the following:       Result Value   WBC 15.2 (*)    Neutro Abs 13.1 (*)    All other components within normal limits  COMPREHENSIVE METABOLIC PANEL - Abnormal; Notable for the following:    Total Protein 5.7 (*)    AST 43 (*)    ALT 68 (*)    All other components within normal limits  I-STAT TROPOININ, ED  I-STAT CG4 LACTIC ACID, ED    EKG  EKG Interpretation  Date/Time:  Monday March 28 2016 11:36:49 EST Ventricular Rate:  86 PR Interval:    QRS Duration: 78 QT Interval:  345 QTC Calculation: 413 R Axis:   -41 Text Interpretation:  Sinus rhythm Ventricular premature complex Inferior infarct, old Probable anteroseptal infarct, recent No significant change since last tracing Confirmed by Maryan Rued  MD, Loree Fee ([id number]) on 03/28/2016 12:30:56 PM       Radiology Dg Chest 2 View  Result Date: 03/28/2016 CLINICAL DATA:  Shortness of breath. History of chronic obstructive pulmonary disease, hypertension and stroke. EXAM: CHEST  2 VIEW COMPARISON:  03/23/2016. FINDINGS: The heart size and mediastinal contours are stable. There is aortic atherosclerosis and tortuosity. There is stable chronic lung disease with subpleural reticulation suspicious for fibrosis. No superimposed airspace disease, edema or pleural effusion. The bones are demineralized. There are multiple thoracolumbar compression deformities status post 2 level spinal augmentation. IMPRESSION: No acute findings demonstrated. Grossly stable chronic interstitial lung disease. This could be further evaluated with high-resolution chest CT if clinically warranted. Electronically Signed   By: Richardean Sale M.D.   On: 03/28/2016 12:16     Procedures Procedures (including critical care time)  Medications Ordered in ED Medications - No data to display   Initial Impression / Assessment and Plan / ED Course  I have reviewed the triage vital signs and the nursing notes.  Pertinent labs & imaging results that were available during my care of the patient were reviewed by me and considered in my medical decision making (see chart for details).    Patient is an elderly male presenting today with worsening shortness of breath and URI symptoms. He has been on antibiotics and prednisone for the last is without improvement in his symptoms. He was seen today by his PCP and sent here for tachycardia. Patient has coarse breath sounds in the left lower lobe but currently is in no acute distress. He does complain of feeling mildly short of breath but oxygen saturation 99% on room air. He was placed on 2 L nasal cannula with improvement of his symptoms. Low suspicion for ACS or CHF. Low suspicion for PE. Concern for possible pneumonia and flu. CBC, CMP, troponin, EKG, lactate, chest x-ray pending  1:32 PM Labs consistent with mild leukocytosis of 15,000 however patient is on prednisone. CMP with mild elevation of LFTs but a normal lactic acid and troponin. Chest x-ray without evidence of pneumonia but he does have chronic interstitial lung disease. Patient's heart rate is less than 100. Patient ambulated around the department with no significant shortness of breath. No tachypnea, heart rate remained under 100 and oxygen saturation of 94%. This time patient still has azithromycin at home and prednisone. He still has an inhaler. Discussed with his PCP Dr. Virgil Benedict office and they will f/u with pt on wed.  Final Clinical Impressions(s) / ED Diagnoses   Final diagnoses:  Bronchitis    New Prescriptions New Prescriptions   No medications on file     Blanchie Dessert, MD 03/28/16 2200

## 2016-03-28 NOTE — ED Notes (Signed)
Transported to xray son at bedside.

## 2016-03-28 NOTE — ED Notes (Signed)
Patient presents to ed via [facility] from his doctors office states he has been congested x 1 week was seen at his PCP last week and was given antibiotics  And steroids states he went back to PCP today and felt sob was worse. Patient is alert oriented able to speak in complete sentences.

## 2016-03-28 NOTE — ED Notes (Signed)
Ambulated in hallway.

## 2016-03-28 NOTE — Progress Notes (Signed)
Pre visit review using our clinic review tool, if applicable. No additional management support is needed unless otherwise documented below in the visit note. 

## 2016-03-30 ENCOUNTER — Ambulatory Visit (INDEPENDENT_AMBULATORY_CARE_PROVIDER_SITE_OTHER): Payer: Medicare Other | Admitting: Physician Assistant

## 2016-03-30 ENCOUNTER — Encounter: Payer: Self-pay | Admitting: Physician Assistant

## 2016-03-30 VITALS — BP 118/62 | HR 98 | Temp 97.7°F | Resp 18 | Ht 68.0 in | Wt 146.0 lb

## 2016-03-30 DIAGNOSIS — J209 Acute bronchitis, unspecified: Secondary | ICD-10-CM

## 2016-03-30 DIAGNOSIS — J440 Chronic obstructive pulmonary disease with acute lower respiratory infection: Secondary | ICD-10-CM

## 2016-03-30 NOTE — Progress Notes (Signed)
Pre visit review using our clinic review tool, if applicable. No additional management support is needed unless otherwise documented below in the visit note. 

## 2016-03-30 NOTE — Patient Instructions (Signed)
Please keep good hydration. Make sure to rest. Keep a humidifier in bedroom.  Use plain mucinex to help thin out any residual congestion. Continue inhalers as directed.  Follow-up if not continuing to resolve.

## 2016-03-30 NOTE — Progress Notes (Signed)
Patient presents to clinic today for follow-up of bronchitis. Patient sent to ER from office at last visit for further assessment giving worsening symptoms on ABX, tachycardia and transient tachypnea. ER workup including repeat CXR negative. Fluids given. Tachypnea and tachycardia resolved. Patient instructed to complete steroid course and antibiotic and to FU here in office. Patient endorses taking medications as directed. Is feeling much better. Is eating, hydrating and resting well. Denies wheezing or SOB. Cough is now dry and less frequent. Denies new symptoms.  Past Medical History:  Diagnosis Date  . Arthritis    hands   . BPH (benign prostatic hyperplasia)   . COPD (chronic obstructive pulmonary disease) ([place])   . Coronary artery disease   . CVA (cerebral infarction) 3 years ago   mild stroke  . HLD (hyperlipidemia)   . Hypertension   . Osteoporosis   . Pneumonia     Current Outpatient Prescriptions on File Prior to Visit  Medication Sig Dispense Refill  . alendronate (FOSAMAX) 70 MG tablet Take 1 tablet (70 mg total) by mouth once a week. TAKE WITH A FULL GLASS OF WATER ON AT NOON TIME   EMPTY STOMACH. 4 tablet 12  . aspirin EC 81 MG tablet Take 81 mg by mouth daily.    [name] atorvastatin (LIPITOR) 20 MG tablet TAKE 1 TABLET (20 MG TOTAL) BY MOUTH DAILY. 90 tablet 1  . Cholecalciferol (D3-1000 PO) Take 2,000 Units by mouth.    . docusate sodium (COLACE) 100 MG capsule Take 100 mg by mouth as needed for mild constipation. Reported on 04/16/2015    . fluticasone (FLOVENT HFA) 110 MCG/ACT inhaler Inhale 2 puffs into the lungs 2 (two) times daily. 3 Inhaler 1  . ipratropium (ATROVENT HFA) 17 MCG/ACT inhaler Inhale 2 puffs into the lungs every 6 (six) hours as needed for wheezing. 1 Inhaler 12  . valsartan (DIOVAN) 80 MG tablet TAKE 1 TABLET (80 MG TOTAL) BY MOUTH DAILY. 90 tablet 1   No current facility-administered medications on file prior to visit.     No Known  Allergies  Family History  Problem Relation Age of Onset  . Colon cancer Brother     Social History   Social History  . Marital status: Widowed    Spouse name: N/A  . Number of children: N/A  . Years of education: N/A   Occupational History  . Retired    Social History Main Topics  . Smoking status: Former Smoker    Packs/day: 1.00    Years: 35.00    Types: Cigarettes    Quit date: 02/29/1988  . Smokeless tobacco: Never Used  . Alcohol use No  . Drug use: No  . Sexual activity: Not Asked   Other Topics Concern  . None   Social History Narrative  . None    Review of Systems - See HPI.  All other ROS are negative.  BP 118/62   Pulse (!) 103   Temp 97.7 F (36.5 C) (Oral)   Resp 18   Ht '5\' 8"'  (1.727 m)   Wt 146 lb (66.2 kg)   SpO2 97%   BMI 22.20 kg/m   Physical Exam  Constitutional: He is oriented to person, place, and time and well-developed, well-nourished, and in no distress.  HENT:  Head: Normocephalic and atraumatic.  Right Ear: External ear normal.  Left Ear: External ear normal.  Nose: Nose normal.  Mouth/Throat: Oropharynx is clear and moist. No oropharyngeal exudate.  Eyes: Conjunctivae are  normal.  Neck: Neck supple.  Cardiovascular: Normal rate, regular rhythm, normal heart sounds and intact distal pulses.   Pulmonary/Chest: Effort normal and breath sounds normal. No respiratory distress. He has no wheezes. He has no rales. He exhibits no tenderness.  Neurological: He is alert and oriented to person, place, and time.  Skin: Skin is warm and dry. No rash noted.  Psychiatric: Affect normal.  Vitals reviewed.  Recent Results (from the past 2160 hour(s))  CBC with Differential/Platelet     Status: Abnormal   Collection Time: 03/28/16 11:40 AM  Result Value Ref Range   WBC 15.2 (H) 4.0 - 10.5 K/uL   RBC 5.27 4.22 - 5.81 MIL/uL   Hemoglobin 16.2 13.0 - 17.0 g/dL   HCT 47.5 39.0 - 52.0 %   MCV 90.1 78.0 - 100.0 fL   MCH 30.7 26.0 - 34.0 pg    MCHC 34.1 30.0 - 36.0 g/dL   RDW 14.2 11.5 - 15.5 %   Platelets 285 150 - 400 K/uL   Neutrophils Relative % 86 %   Neutro Abs 13.1 (H) 1.7 - 7.7 K/uL   Lymphocytes Relative 9 %   Lymphs Abs 1.3 0.7 - 4.0 K/uL   Monocytes Relative 5 %   Monocytes Absolute 0.8 0.1 - 1.0 K/uL   Eosinophils Relative 0 %   Eosinophils Absolute 0.0 0.0 - 0.7 K/uL   Basophils Relative 0 %   Basophils Absolute 0.0 0.0 - 0.1 K/uL  Comprehensive metabolic panel     Status: Abnormal   Collection Time: 03/28/16 11:40 AM  Result Value Ref Range   Sodium 140 135 - 145 mmol/L   Potassium 4.3 3.5 - 5.1 mmol/L   Chloride 102 101 - 111 mmol/L   CO2 25 22 - 32 mmol/L   Glucose, Bld 91 65 - 99 mg/dL   BUN 19 6 - 20 mg/dL   Creatinine, Ser 1.00 0.61 - 1.24 mg/dL   Calcium 9.4 8.9 - 10.3 mg/dL   Total Protein 5.7 (L) 6.5 - 8.1 g/dL   Albumin 3.5 3.5 - 5.0 g/dL   AST 43 (H) 15 - 41 U/L   ALT 68 (H) 17 - 63 U/L   Alkaline Phosphatase 53 38 - 126 U/L   Total Bilirubin 0.9 0.3 - 1.2 mg/dL   GFR calc non Af Amer >60 >60 mL/min   GFR calc Af Amer >60 >60 mL/min    Comment: (NOTE) The eGFR has been calculated using the CKD EPI equation. This calculation has not been validated in all clinical situations. eGFR's persistently <60 mL/min signify possible Chronic Kidney Disease.    Anion gap 13 5 - 15  I-stat troponin, ED     Status: None   Collection Time: 03/28/16 11:53 AM  Result Value Ref Range   Troponin i, poc 0.00 0.00 - 0.08 ng/mL   Comment 3            Comment: Due to the release kinetics of cTnI, a negative result within the first hours of the onset of symptoms does not rule out myocardial infarction with certainty. If myocardial infarction is still suspected, repeat the test at appropriate intervals.   I-Stat CG4 Lactic Acid, ED     Status: None   Collection Time: 03/28/16 11:56 AM  Result Value Ref Range   Lactic Acid, Venous 1.73 0.5 - 1.9 mmol/L   Assessment/Plan: 1. Acute bronchitis with COPD  ([place]) ER workup unremarkable. Resolution of tachycardia and tachypnea. Patient saturating very well  on room area. No tachycardia or tachypnea today. Lungs CTAB. Completed course of steroid. Patient to complete ABX. Continue supportive measures and OTC medications. Return precautions discussed.    Leeanne Rio, PA-C

## 2016-04-11 DIAGNOSIS — D692 Other nonthrombocytopenic purpura: Secondary | ICD-10-CM | POA: Diagnosis not present

## 2016-04-11 DIAGNOSIS — Z85828 Personal history of other malignant neoplasm of skin: Secondary | ICD-10-CM | POA: Diagnosis not present

## 2016-04-11 DIAGNOSIS — L570 Actinic keratosis: Secondary | ICD-10-CM | POA: Diagnosis not present

## 2016-04-18 ENCOUNTER — Ambulatory Visit (INDEPENDENT_AMBULATORY_CARE_PROVIDER_SITE_OTHER): Payer: Medicare Other | Admitting: Family Medicine

## 2016-04-18 ENCOUNTER — Encounter: Payer: Self-pay | Admitting: Family Medicine

## 2016-04-18 VITALS — BP 118/68 | HR 101 | Temp 98.0°F | Resp 17 | Ht 68.0 in | Wt 147.2 lb

## 2016-04-18 DIAGNOSIS — I10 Essential (primary) hypertension: Secondary | ICD-10-CM

## 2016-04-18 DIAGNOSIS — E785 Hyperlipidemia, unspecified: Secondary | ICD-10-CM

## 2016-04-18 LAB — HEPATIC FUNCTION PANEL
ALT: 19 U/L (ref 0–53)
AST: 16 U/L (ref 0–37)
Albumin: 4 g/dL (ref 3.5–5.2)
Alkaline Phosphatase: 66 U/L (ref 39–117)
Bilirubin, Direct: 0.2 mg/dL (ref 0.0–0.3)
Total Bilirubin: 1 mg/dL (ref 0.2–1.2)
Total Protein: 6 g/dL (ref 6.0–8.3)

## 2016-04-18 LAB — LIPID PANEL
Cholesterol: 117 mg/dL (ref 0–200)
HDL: 38.6 mg/dL — ABNORMAL LOW (ref >39.00)
LDL Cholesterol: 66 mg/dL (ref 0–99)
NonHDL: 78.23
Total CHOL/HDL Ratio: 3
Triglycerides: 63 mg/dL (ref 0.0–149.0)
VLDL: 12.6 mg/dL (ref 0.0–40.0)

## 2016-04-18 LAB — CBC WITH DIFFERENTIAL/PLATELET
BASOS ABS: 0.1 10*3/uL (ref 0.0–0.1)
BASOS PCT: 0.8 % (ref 0.0–3.0)
EOS ABS: 0.3 10*3/uL (ref 0.0–0.7)
Eosinophils Relative: 2.7 % (ref 0.0–5.0)
HCT: 49.2 % (ref 39.0–52.0)
Hemoglobin: 16 g/dL (ref 13.0–17.0)
LYMPHS ABS: 2 10*3/uL (ref 0.7–4.0)
Lymphocytes Relative: 19.3 % (ref 12.0–46.0)
MCHC: 32.5 g/dL (ref 30.0–36.0)
MCV: 93.2 fl (ref 78.0–100.0)
MONO ABS: 0.9 10*3/uL (ref 0.1–1.0)
Monocytes Relative: 8.4 % (ref 3.0–12.0)
NEUTROS PCT: 68.8 % (ref 43.0–77.0)
Neutro Abs: 7.2 10*3/uL (ref 1.4–7.7)
Platelets: 218 10*3/uL (ref 150.0–400.0)
RBC: 5.28 Mil/uL (ref 4.22–5.81)
RDW: 15.2 % (ref 11.5–15.5)
WBC: 10.5 10*3/uL (ref 4.0–10.5)

## 2016-04-18 LAB — BASIC METABOLIC PANEL
BUN: 13 mg/dL (ref 6–23)
CHLORIDE: 106 meq/L (ref 96–112)
CO2: 30 mEq/L (ref 19–32)
Calcium: 9.6 mg/dL (ref 8.4–10.5)
Creatinine, Ser: 0.86 mg/dL (ref 0.40–1.50)
GFR: 90.39 mL/min (ref >60.00)
Glucose, Bld: 115 mg/dL — ABNORMAL HIGH (ref 70–99)
POTASSIUM: 4.8 meq/L (ref 3.5–5.1)
Sodium: 144 mEq/L (ref 135–145)

## 2016-04-18 NOTE — Progress Notes (Signed)
   Subjective:    Patient ID: [name], male    DOB: 1931-07-03, 83 y.o.   MRN: [id number]  HPI Hyperlipidemia- chronic problem.  On Lipitor 20mg  daily.  No abd pain, N/V.  HTN- chronic problem.  On Valsartan 80mg  daily w/ good control.  No CP, SOB, HAs, visual changes, edema.   Review of Systems For ROS see HPI     Objective:   Physical Exam  Constitutional: He is oriented to person, place, and time. He appears well-developed and well-nourished. No distress.  HENT:  Head: Normocephalic and atraumatic.  Eyes: Conjunctivae and EOM are normal. Pupils are equal, round, and reactive to light.  Neck: Normal range of motion. Neck supple. No thyromegaly present.  Cardiovascular: Normal rate, regular rhythm, normal heart sounds and intact distal pulses.   No murmur heard. Pulmonary/Chest: Effort normal and breath sounds normal. No respiratory distress.  Abdominal: Soft. Bowel sounds are normal. He exhibits no distension.  Musculoskeletal: He exhibits no edema.  Lymphadenopathy:    He has no cervical adenopathy.  Neurological: He is alert and oriented to person, place, and time. No cranial nerve deficit.  Skin: Skin is warm and dry.  Psychiatric: He has a normal mood and affect. His behavior is normal.  Vitals reviewed.         Assessment & Plan:

## 2016-04-18 NOTE — Progress Notes (Signed)
Pre visit review using our clinic review tool, if applicable. No additional management support is needed unless otherwise documented below in the visit note. 

## 2016-04-18 NOTE — Assessment & Plan Note (Signed)
Chronic problem.  Tolerating statin w/o difficulty.  Check labs.  Adjust meds prn  

## 2016-04-18 NOTE — Patient Instructions (Signed)
Schedule your Medicare Wellness Visit w/ our Health Coach Maudie Mercury) in 6 months and a follow up for BP and cholesterol around the same time [facility] notify you of your lab results and make any changes if needed Keep up the good work!  You look great! Call with any questions or concerns Happy Spring!!!

## 2016-04-18 NOTE — Assessment & Plan Note (Signed)
Chronic problem.  Well controlled today.  Asymptomatic.  Check labs.  No anticipated med changes. 

## 2016-04-19 ENCOUNTER — Other Ambulatory Visit (INDEPENDENT_AMBULATORY_CARE_PROVIDER_SITE_OTHER): Payer: Medicare Other

## 2016-04-19 DIAGNOSIS — R7309 Other abnormal glucose: Secondary | ICD-10-CM | POA: Diagnosis not present

## 2016-04-19 LAB — HEMOGLOBIN A1C: HEMOGLOBIN A1C: 6.6 % — AB (ref 4.6–6.5)

## 2016-07-08 ENCOUNTER — Other Ambulatory Visit: Payer: Self-pay | Admitting: Family Medicine

## 2016-07-08 ENCOUNTER — Other Ambulatory Visit: Payer: Self-pay

## 2016-07-08 MED ORDER — ALENDRONATE SODIUM 70 MG PO TABS: 70.0000 mg | ORAL_TABLET | ORAL | 3 refills | Status: DC

## 2016-07-13 ENCOUNTER — Other Ambulatory Visit: Payer: Self-pay | Admitting: Dermatology

## 2016-07-13 DIAGNOSIS — C4492 Squamous cell carcinoma of skin, unspecified: Secondary | ICD-10-CM

## 2016-07-13 DIAGNOSIS — L570 Actinic keratosis: Secondary | ICD-10-CM | POA: Diagnosis not present

## 2016-07-13 DIAGNOSIS — D044 Carcinoma in situ of skin of scalp and neck: Secondary | ICD-10-CM | POA: Diagnosis not present

## 2016-07-13 HISTORY — DX: Squamous cell carcinoma of skin, unspecified: C44.92

## 2016-08-01 ENCOUNTER — Other Ambulatory Visit: Payer: Self-pay | Admitting: Family Medicine

## 2016-10-25 NOTE — Progress Notes (Signed)
Subjective:   [name] is a 83 y.o. male who presents for Medicare Annual/Subsequent preventive examination.  Review of Systems:  No ROS.  Medicare Wellness Visit. Additional risk factors are reflected in the social history.    Sleep patterns: Sleeps 8 hours, feels rested. Up to void x 1.  Home Safety/Smoke Alarms: Feels safe in home. Smoke alarms in place.  Living environment; residence and Firearm Safety: Lives with son (and family) in 2 story home.  Seat Belt Safety/Bike Helmet: Wears seat belt.   Male:   CCS-Colonoscopy 01/07/2012, normal.     PSA-  Lab Results  Component Value Date   PSA 3.01 10/21/2015   PSA 2.46 10/08/2014   PSA 3.23 10/15/2013       Objective:    Vitals: There were no vitals taken for this visit.  There is no height or weight on file to calculate BMI.  Tobacco History  Smoking Status  . Former Smoker  . Packs/day: 1.00  . Years: 35.00  . Types: Cigarettes  . Quit date: 02/29/1988  Smokeless Tobacco  . Never Used     Counseling given: Not Answered   Past Medical History:  Diagnosis Date  . Arthritis    hands   . BPH (benign prostatic hyperplasia)   . COPD (chronic obstructive pulmonary disease) ([place])   . Coronary artery disease   . CVA (cerebral infarction) 3 years ago   mild stroke  . HLD (hyperlipidemia)   . Hypertension   . Osteoporosis   . Pneumonia    Past Surgical History:  Procedure Laterality Date  . BACK SURGERY    . LAPAROSCOPIC APPENDECTOMY N/A 11/02/2013   Procedure: APPENDECTOMY LAPAROSCOPIC;  Surgeon: Donnie Mesa, MD;  Location: [facility] OR;  Service: General;  Laterality: N/A;   Family History  Problem Relation Age of Onset  . Colon cancer Brother    History  Sexual Activity  . Sexual activity: Not on file    Outpatient Encounter Prescriptions as of 10/26/2016  Medication Sig  . alendronate (FOSAMAX) 70 MG tablet Take 1 tablet (70 mg total) by mouth once a week. TAKE WITH A FULL GLASS OF WATER ON AT NOON  TIME   EMPTY STOMACH.  [name] aspirin EC 81 MG tablet Take 81 mg by mouth daily.  [name] atorvastatin (LIPITOR) 20 MG tablet TAKE 1 TABLET (20 MG TOTAL) BY MOUTH DAILY.  [name] Cholecalciferol (D3-1000 PO) Take 2,000 Units by mouth.  . docusate sodium (COLACE) 100 MG capsule Take 100 mg by mouth as needed for mild constipation. Reported on 04/16/2015  . FLOVENT HFA 110 MCG/ACT inhaler USE 2 INHALATIONS TWICE A DAY  . ipratropium (ATROVENT HFA) 17 MCG/ACT inhaler Inhale 2 puffs into the lungs every 6 (six) hours as needed for wheezing.  . valsartan (DIOVAN) 80 MG tablet TAKE 1 TABLET (80 MG TOTAL) BY MOUTH DAILY.   No facility-administered encounter medications on file as of 10/26/2016.     Activities of Daily Living In your present state of health, do you have any difficulty performing the following activities: 03/28/2016  Hearing? N  Vision? N  Difficulty concentrating or making decisions? N  Walking or climbing stairs? N  Dressing or bathing? N  Doing errands, shopping? N  Some recent data might be hidden    Patient Care Team: [name], MD as PCP - General (Family Medicine) Marin Olp Rudell Cobb, MD as Consulting Physician (Oncology) Tish Men, PA-C as Consulting Physician (Dermatology) Tanda Rockers, MD as Consulting  Physician (Pulmonary Disease) Renato Shin, MD as Consulting Physician (Endocrinology)   Assessment:    Physical assessment deferred to PCP.  Exercise Activities and Dietary recommendations   Diet (meal preparation, eat out, water intake, caffeinated beverages, dairy products, fruits and vegetables): Drinks water and coffee.   Breakfast: sausage/egg biscuit Lunch: Sandwich, banana, protein drinks Dinner: protein and vegetables.   Goals    None     Fall Risk Fall Risk  03/28/2016 10/21/2015 04/16/2015 10/13/2014 10/08/2014  Falls in the past year? No No No No No  Number falls in past yr: - - - - -  Risk Factor Category  - - - - -  Risk for fall due to : - -  - - -   Depression Screen PHQ 2/9 Scores 03/28/2016 10/21/2015 04/16/2015 10/08/2014  PHQ - 2 Score 0 0 0 0  PHQ- 9 Score 0 - - -  Exception Documentation - - Patient refusal -    Cognitive Function       Ad8 score reviewed for issues:  Issues making decisions: no  Less interest in hobbies / activities: no  Repeats questions, stories (family complaining): no  Trouble using ordinary gadgets (microwave, computer, phone): no  Forgets the month or year: no  Mismanaging finances: no  Remembering appts: no  Daily problems with thinking and/or memory: no Ad8 score is=0     Immunization History  Administered Date(s) Administered  . Influenza Split 11/28/2012  . Influenza,inj,Quad PF,6+ Mos 11/27/2013, 11/03/2015  . Pneumococcal Conjugate-13 10/23/2013  . Pneumococcal Polysaccharide-23 10/21/2015   Screening Tests Health Maintenance  Topic Date Due  . INFLUENZA VACCINE  09/28/2016  . TETANUS/TDAP  11/28/2016 (Originally 11/05/1950)  . PNA vac Low Risk Adult  Completed      Plan:    Bring a copy of your living will and/or healthcare power of attorney to your next office visit.  Continue doing brain stimulating activities (puzzles, reading, adult coloring books, staying active) to keep memory sharp.   Shingles vaccine at pharmacy.    I have personally reviewed and noted the following in the patient's chart:   . Medical and social history . Use of alcohol, tobacco or illicit drugs  . Current medications and supplements . Functional ability and status . Nutritional status . Physical activity . Advanced directives . List of other physicians . Hospitalizations, surgeries, and ER visits in previous 12 months . Vitals . Screenings to include cognitive, depression, and falls . Referrals and appointments  In addition, I have reviewed and discussed with patient certain preventive protocols, quality metrics, and best practice recommendations. A written personalized care  plan for preventive services as well as general preventive health recommendations were provided to patient.     Gerilyn Nestle, RN  10/25/2016   Reviewed documentation provided by RN and agree w/ above.  Annye Asa, MD

## 2016-10-26 ENCOUNTER — Encounter: Payer: Self-pay | Admitting: General Practice

## 2016-10-26 ENCOUNTER — Encounter: Payer: Self-pay | Admitting: Family Medicine

## 2016-10-26 ENCOUNTER — Ambulatory Visit (INDEPENDENT_AMBULATORY_CARE_PROVIDER_SITE_OTHER): Payer: Medicare Other | Admitting: Family Medicine

## 2016-10-26 ENCOUNTER — Ambulatory Visit: Payer: Medicare Other

## 2016-10-26 VITALS — BP 118/81 | HR 76 | Temp 97.7°F | Resp 18 | Ht 68.0 in | Wt 143.6 lb

## 2016-10-26 DIAGNOSIS — Z125 Encounter for screening for malignant neoplasm of prostate: Secondary | ICD-10-CM

## 2016-10-26 DIAGNOSIS — Z23 Encounter for immunization: Secondary | ICD-10-CM

## 2016-10-26 DIAGNOSIS — I10 Essential (primary) hypertension: Secondary | ICD-10-CM | POA: Diagnosis not present

## 2016-10-26 DIAGNOSIS — Z0000 Encounter for general adult medical examination without abnormal findings: Secondary | ICD-10-CM

## 2016-10-26 DIAGNOSIS — M810 Age-related osteoporosis without current pathological fracture: Secondary | ICD-10-CM

## 2016-10-26 DIAGNOSIS — E785 Hyperlipidemia, unspecified: Secondary | ICD-10-CM

## 2016-10-26 LAB — CBC WITH DIFFERENTIAL/PLATELET
Basophils Absolute: 0 10*3/uL (ref 0.0–0.1)
Basophils Relative: 0.2 % (ref 0.0–3.0)
EOS PCT: 0.8 % (ref 0.0–5.0)
Eosinophils Absolute: 0.1 10*3/uL (ref 0.0–0.7)
HCT: 49 % (ref 39.0–52.0)
HEMOGLOBIN: 16.2 g/dL (ref 13.0–17.0)
Lymphocytes Relative: 20.8 % (ref 12.0–46.0)
Lymphs Abs: 2.5 10*3/uL (ref 0.7–4.0)
MCHC: 33 g/dL (ref 30.0–36.0)
MCV: 94 fl (ref 78.0–100.0)
MONOS PCT: 8.7 % (ref 3.0–12.0)
Monocytes Absolute: 1 10*3/uL (ref 0.1–1.0)
Neutro Abs: 8.2 10*3/uL — ABNORMAL HIGH (ref 1.4–7.7)
Neutrophils Relative %: 69.5 % (ref 43.0–77.0)
Platelets: 245 10*3/uL (ref 150.0–400.0)
RBC: 5.22 Mil/uL (ref 4.22–5.81)
RDW: 14.8 % (ref 11.5–15.5)
WBC: 11.8 10*3/uL — AB (ref 4.0–10.5)

## 2016-10-26 LAB — BASIC METABOLIC PANEL
BUN: 16 mg/dL (ref 6–23)
CALCIUM: 10 mg/dL (ref 8.4–10.5)
CO2: 34 mEq/L — ABNORMAL HIGH (ref 19–32)
CREATININE: 0.9 mg/dL (ref 0.40–1.50)
Chloride: 104 mEq/L (ref 96–112)
GFR: 85.66 mL/min (ref >60.00)
Glucose, Bld: 67 mg/dL — ABNORMAL LOW (ref 70–99)
Potassium: 4.6 mEq/L (ref 3.5–5.1)
SODIUM: 143 meq/L (ref 135–145)

## 2016-10-26 LAB — LIPID PANEL
CHOL/HDL RATIO: 3
Cholesterol: 127 mg/dL (ref 0–200)
HDL: 42.2 mg/dL (ref >39.00)
LDL Cholesterol: 72 mg/dL (ref 0–99)
NONHDL: 84.45
Triglycerides: 60 mg/dL (ref 0.0–149.0)
VLDL: 12 mg/dL (ref 0.0–40.0)

## 2016-10-26 LAB — HEPATIC FUNCTION PANEL
ALT: 22 U/L (ref 0–53)
AST: 18 U/L (ref 0–37)
Albumin: 4.2 g/dL (ref 3.5–5.2)
Alkaline Phosphatase: 63 U/L (ref 39–117)
BILIRUBIN TOTAL: 0.8 mg/dL (ref 0.2–1.2)
Bilirubin, Direct: 0.2 mg/dL (ref 0.0–0.3)
Total Protein: 6.2 g/dL (ref 6.0–8.3)

## 2016-10-26 LAB — PSA, MEDICARE: PSA: 2.48 ng/ml (ref 0.10–4.00)

## 2016-10-26 LAB — TSH: TSH: 1.16 u[IU]/mL (ref 0.35–4.50)

## 2016-10-26 LAB — VITAMIN D 25 HYDROXY (VIT D DEFICIENCY, FRACTURES): VITD: 45.35 ng/mL (ref 30.00–100.00)

## 2016-10-26 MED ORDER — LOSARTAN POTASSIUM 100 MG PO TABS: 100.0000 mg | ORAL_TABLET | Freq: Every day | ORAL | 1 refills | Status: DC

## 2016-10-26 MED ORDER — ZOSTER VAC RECOMB ADJUVANTED 50 MCG/0.5ML IM SUSR: 0.5000 mL | Freq: Once | INTRAMUSCULAR | 1 refills | Status: AC

## 2016-10-26 NOTE — Progress Notes (Signed)
   Subjective:    Patient ID: [name], male    DOB: 1931-07-07, 83 y.o.   MRN: [id number]  HPI HTN- chronic problem, on Valsartan daily.  No CP, SOB, HAs, visual changes, edema.  Hyperlipidemia- chronic poblem, on Lipitor daily.  No abd pain, N/V.  Pt reports he is fishing daily and considers this his exercise despite sitting in a chair.  Osteoporosis- chronic problem, on Fosamax and Vit D3.  UTD on Dexa.  Prostate cancer screen- due for PSA   Review of Systems For ROS see HPI     Objective:   Physical Exam  Constitutional: He is oriented to person, place, and time. He appears well-developed and well-nourished. No distress.  HENT:  Head: Normocephalic and atraumatic.  Eyes: Pupils are equal, round, and reactive to light. Conjunctivae and EOM are normal.  Neck: Normal range of motion. Neck supple. No thyromegaly present.  Cardiovascular: Normal rate, regular rhythm, normal heart sounds and intact distal pulses.   No murmur heard. Pulmonary/Chest: Effort normal and breath sounds normal. No respiratory distress.  Abdominal: Soft. Bowel sounds are normal. He exhibits no distension.  Musculoskeletal: He exhibits no edema.  Lymphadenopathy:    He has no cervical adenopathy.  Neurological: He is alert and oriented to person, place, and time. No cranial nerve deficit.  Skin: Skin is warm and dry.  Psychiatric: He has a normal mood and affect. His behavior is normal.  Vitals reviewed.         Assessment & Plan:

## 2016-10-26 NOTE — Assessment & Plan Note (Signed)
Chronic problem.  UTD on DEXA.  On Fosamax weekly and Vit D daily.  No med changes at this time.  Will follow.

## 2016-10-26 NOTE — Assessment & Plan Note (Signed)
Chronic problem.  Tolerating statin w/o difficulty.  Check labs.  Adjust meds prn  

## 2016-10-26 NOTE — Patient Instructions (Addendum)
Follow up in 6 months to recheck BP and cholesterol We'll notify you of your lab results and make any changes if needed Continue to work on healthy diet and regular exercise- you look great! You are up to date on colonoscopy and immunizations (shingles is at pharmacy)- yay! Call with any questions or concerns Happy Early Birthday!!!   Bring a copy of your living will and/or healthcare power of attorney to your next office visit.  Continue doing brain stimulating activities (puzzles, reading, adult coloring books, staying active) to keep memory sharp.   Shingles vaccine at pharmacy.    Health Maintenance, Male A healthy lifestyle and preventive care is important for your health and wellness. Ask your health care provider about what schedule of regular examinations is right for you. What should I know about weight and diet? Eat a Healthy Diet  Eat plenty of vegetables, fruits, whole grains, low-fat dairy products, and lean protein.  Do not eat a lot of foods high in solid fats, added sugars, or salt.  Maintain a Healthy Weight Regular exercise can help you achieve or maintain a healthy weight. You should:  Do at least 150 minutes of exercise each week. The exercise should increase your heart rate and make you sweat (moderate-intensity exercise).  Do strength-training exercises at least twice a week.  Watch Your Levels of Cholesterol and Blood Lipids  Have your blood tested for lipids and cholesterol every 5 years starting at 81 years of age. If you are at high risk for heart disease, you should start having your blood tested when you are 81 years old. You may need to have your cholesterol levels checked more often if: ? Your lipid or cholesterol levels are high. ? You are older than 81 years of age. ? You are at high risk for heart disease.  What should I know about cancer screening? Many types of cancers can be detected early and may often be prevented. Lung Cancer  You  should be screened every year for lung cancer if: ? You are a current smoker who has smoked for at least 30 years. ? You are a former smoker who has quit within the past 15 years.  Talk to your health care provider about your screening options, when you should start screening, and how often you should be screened.  Colorectal Cancer  Routine colorectal cancer screening usually begins at 81 years of age and should be repeated every 5-10 years until you are 81 years old. You may need to be screened more often if early forms of precancerous polyps or small growths are found. Your health care provider may recommend screening at an earlier age if you have risk factors for colon cancer.  Your health care provider may recommend using home test kits to check for hidden blood in the stool.  A small camera at the end of a tube can be used to examine your colon (sigmoidoscopy or colonoscopy). This checks for the earliest forms of colorectal cancer.  Prostate and Testicular Cancer  Depending on your age and overall health, your health care provider may do certain tests to screen for prostate and testicular cancer.  Talk to your health care provider about any symptoms or concerns you have about testicular or prostate cancer.  Skin Cancer  Check your skin from head to toe regularly.  Tell your health care provider about any new moles or changes in moles, especially if: ? There is a change in a mole's size, shape, or color. ?  You have a mole that is larger than a pencil eraser.  Always use sunscreen. Apply sunscreen liberally and repeat throughout the day.  Protect yourself by wearing long sleeves, pants, a wide-brimmed hat, and sunglasses when outside.  What should I know about heart disease, diabetes, and high blood pressure?  If you are 29-33 years of age, have your blood pressure checked every 3-5 years. If you are 57 years of age or older, have your blood pressure checked every year. You  should have your blood pressure measured twice-once when you are at a hospital or clinic, and once when you are not at a hospital or clinic. Record the average of the two measurements. To check your blood pressure when you are not at a hospital or clinic, you can use: ? An automated blood pressure machine at a pharmacy. ? A home blood pressure monitor.  Talk to your health care provider about your target blood pressure.  If you are between 14-100 years old, ask your health care provider if you should take aspirin to prevent heart disease.  Have regular diabetes screenings by checking your fasting blood sugar level. ? If you are at a normal weight and have a low risk for diabetes, have this test once every three years after the age of 90. ? If you are overweight and have a high risk for diabetes, consider being tested at a younger age or more often.  A one-time screening for abdominal aortic aneurysm (AAA) by ultrasound is recommended for men aged 75-75 years who are current or former smokers. What should I know about preventing infection? Hepatitis B If you have a higher risk for hepatitis B, you should be screened for this virus. Talk with your health care provider to find out if you are at risk for hepatitis B infection. Hepatitis C Blood testing is recommended for:  Everyone born from [birth year] through 1963.  Anyone with known risk factors for hepatitis C.  Sexually Transmitted Diseases (STDs)  You should be screened each year for STDs including gonorrhea and chlamydia if: ? You are sexually active and are younger than 81 years of age. ? You are older than 81 years of age and your health care provider tells you that you are at risk for this type of infection. ? Your sexual activity has changed since you were last screened and you are at an increased risk for chlamydia or gonorrhea. Ask your health care provider if you are at risk.  Talk with your health care provider about whether you are  at high risk of being infected with HIV. Your health care provider may recommend a prescription medicine to help prevent HIV infection.  What else can I do?  Schedule regular health, dental, and eye exams.  Stay current with your vaccines (immunizations).  Do not use any tobacco products, such as cigarettes, chewing tobacco, and e-cigarettes. If you need help quitting, ask your health care provider.  Limit alcohol intake to no more than 2 drinks per day. One drink equals 12 ounces of beer, 5 ounces of wine, or 1 ounces of hard liquor.  Do not use street drugs.  Do not share needles.  Ask your health care provider for help if you need support or information about quitting drugs.  Tell your health care provider if you often feel depressed.  Tell your health care provider if you have ever been abused or do not feel safe at home. This information is not intended to replace advice given  to you by your health care provider. Make sure you discuss any questions you have with your health care provider. Document Released: 08/13/2007 Document Revised: 10/14/2015 Document Reviewed: 11/18/2014 Elsevier Interactive Patient Education  [employer].

## 2016-10-26 NOTE — Assessment & Plan Note (Signed)
Chronic problem.  Will switch pt's Valsartan to Losartan due to pharmacy recall.  BP is adequately controlled and pt is asymptomatic.  Check labs.  Will follow.

## 2016-11-25 ENCOUNTER — Encounter: Payer: Self-pay | Admitting: Endocrinology

## 2016-11-25 ENCOUNTER — Ambulatory Visit (INDEPENDENT_AMBULATORY_CARE_PROVIDER_SITE_OTHER): Payer: Medicare Other | Admitting: Endocrinology

## 2016-11-25 VITALS — BP 112/78 | HR 107 | Wt 142.0 lb

## 2016-11-25 DIAGNOSIS — M810 Age-related osteoporosis without current pathological fracture: Secondary | ICD-10-CM | POA: Diagnosis not present

## 2016-11-25 NOTE — Patient Instructions (Addendum)
Please continue the same alendronate. Let's recheck the bone-density test.  you will receive a phone call, about a day and time for an appointment.  We'll let you know about the results. If it is not better, i'll prescribe for you a twice a year injection, given at the doctor's office.  To help the heartburn, take prilosec OTC, 1 pill a day the day before and the day of each alendronate it is critically important to prevent falling down (keep floor areas well-lit, dry, and free of loose objects.  If you have a cane, walker, or wheelchair, you should use it, even for short trips around the house.  Also, try not to rush).   Please return in 1 year.

## 2016-11-25 NOTE — Progress Notes (Signed)
Subjective:    Patient ID: [name], male    DOB: [birth year], 83 y.o.   MRN: [id number]  HPI Pt returns for f/u of osteoporosis: (fosamax was started in 2015; he has hypogonadism, but rx of this is precluded by BPH; CXR has shown vertebral compression fractures).  Pt says he takes fosamax as rx, and tolerates well.  He seldom has heartburn.   He has also been rx'ed high-dose ergocalciferol for vit-D deficiency; he takes OTC vit-D, but not rx ergocalciferol.  Denies leg cramps.   BPH: no change in chronically decreased urinary stream.  He has mild heartburn sensation in the epigast area, but no assoc n/v.  Past Medical History:  Diagnosis Date  . Arthritis    hands   . BPH (benign prostatic hyperplasia)   . COPD (chronic obstructive pulmonary disease) ([place])   . Coronary artery disease   . CVA (cerebral infarction) 3 years ago   mild stroke  . HLD (hyperlipidemia)   . Hypertension   . Osteoporosis   . Pneumonia     Past Surgical History:  Procedure Laterality Date  . BACK SURGERY    . LAPAROSCOPIC APPENDECTOMY N/A 11/02/2013   Procedure: APPENDECTOMY LAPAROSCOPIC;  Surgeon: Donnie Mesa, MD;  Location: [facility];  Service: General;  Laterality: N/A;    Social History   Social History  . Marital status: Widowed    Spouse name: N/A  . Number of children: N/A  . Years of education: N/A   Occupational History  . Retired    Social History Main Topics  . Smoking status: Former Smoker    Packs/day: 1.00    Years: 35.00    Types: Cigarettes    Quit date: 02/29/1988  . Smokeless tobacco: Never Used  . Alcohol use No  . Drug use: No  . Sexual activity: Not on file   Other Topics Concern  . Not on file   Social History Narrative  . No narrative on file    Current Outpatient Prescriptions on File Prior to Visit  Medication Sig Dispense Refill  . alendronate (FOSAMAX) 70 MG tablet Take 1 tablet (70 mg total) by mouth once a week. TAKE WITH A FULL GLASS OF WATER ON AT  NOON TIME   EMPTY STOMACH. 12 tablet 3  . aspirin EC 81 MG tablet Take 81 mg by mouth daily.    [name] atorvastatin (LIPITOR) 20 MG tablet TAKE 1 TABLET (20 MG TOTAL) BY MOUTH DAILY. 90 tablet 1  . Cholecalciferol (D3-1000 PO) Take 2,000 Units by mouth.    . docusate sodium (COLACE) 100 MG capsule Take 100 mg by mouth as needed for mild constipation. Reported on 04/16/2015    . FLOVENT HFA 110 MCG/ACT inhaler USE 2 INHALATIONS TWICE A DAY 36 g 1  . ipratropium (ATROVENT HFA) 17 MCG/ACT inhaler Inhale 2 puffs into the lungs every 6 (six) hours as needed for wheezing. 1 Inhaler 12  . losartan (COZAAR) 100 MG tablet Take 1 tablet (100 mg total) by mouth daily. 90 tablet 1   No current facility-administered medications on file prior to visit.     No Known Allergies  Family History  Problem Relation Age of Onset  . Colon cancer Brother     BP 112/78   Pulse (!) 107   Wt 142 lb (64.4 kg)   SpO2 95%   BMI 21.59 kg/m   Review of Systems Denies falls.  He says urinary hesitancy is unchanged.  Objective:   Physical Exam VITAL SIGNS:  See vs page.  GENERAL: no distress.   Chest wall: kyphosis is again noted.   Gait: normal and steady.   Lab Results  Component Value Date   PTH 60 11/27/2015   CALCIUM 10.0 10/26/2016   Lab Results  Component Value Date   TSH 1.16 10/26/2016      Assessment & Plan:  Osteoporosis: due for recheck. Heartburn, new to me, prob due to fosamax. Prostatism: this precludes rx of low testosterone  Patient Instructions  Please continue the same alendronate. Let's recheck the bone-density test.  you will receive a phone call, about a day and time for an appointment.  We'll let you know about the results. If it is not better, i'll prescribe for you a twice a year injection, given at the doctor's office.  To help the heartburn, take prilosec OTC, 1 pill a day the day before and the day of each alendronate it is critically important to prevent falling down  (keep floor areas well-lit, dry, and free of loose objects.  If you have a cane, walker, or wheelchair, you should use it, even for short trips around the house.  Also, try not to rush).   Please return in 1 year.

## 2016-12-12 ENCOUNTER — Telehealth: Payer: Self-pay | Admitting: Endocrinology

## 2016-12-12 NOTE — Telephone Encounter (Signed)
Please forward to [facility] 

## 2016-12-12 NOTE — Telephone Encounter (Signed)
Patient stated he didn't get a call to set up an appointment for his bone density  test

## 2016-12-22 ENCOUNTER — Ambulatory Visit (INDEPENDENT_AMBULATORY_CARE_PROVIDER_SITE_OTHER)
Admission: RE | Admit: 2016-12-22 | Discharge: 2016-12-22 | Disposition: A | Discharge status: Home / Self Care | Payer: Medicare Other | Source: Ambulatory Visit | Attending: Endocrinology | Admitting: Endocrinology

## 2016-12-22 DIAGNOSIS — M810 Age-related osteoporosis without current pathological fracture: Secondary | ICD-10-CM | POA: Diagnosis not present

## 2017-01-03 NOTE — Telephone Encounter (Signed)
Patient came in office and is inquiring about a prolia injection. Patient had his bone density done on 12/22/16. Please call patient to schedule appt . His contact number is [phone]. Please advise. Thank you

## 2017-01-03 NOTE — Telephone Encounter (Signed)
Please request prolia.  i'll sign the form.

## 2017-01-18 NOTE — Telephone Encounter (Signed)
Patient has been verified and he does not need PA, owes$0, and is scheduled to get injection on 01/24/17

## 2017-01-24 ENCOUNTER — Ambulatory Visit (INDEPENDENT_AMBULATORY_CARE_PROVIDER_SITE_OTHER): Payer: Medicare Other

## 2017-01-24 DIAGNOSIS — M810 Age-related osteoporosis without current pathological fracture: Secondary | ICD-10-CM | POA: Diagnosis not present

## 2017-01-24 MED ORDER — DENOSUMAB 60 MG/ML ~~LOC~~ SOLN
60.0000 mg | Freq: Once | SUBCUTANEOUS | Status: AC
  Administered 2017-01-24: 60 mg via SUBCUTANEOUS

## 2017-01-27 ENCOUNTER — Other Ambulatory Visit: Payer: Self-pay | Admitting: Family Medicine

## 2017-03-03 ENCOUNTER — Ambulatory Visit (INDEPENDENT_AMBULATORY_CARE_PROVIDER_SITE_OTHER): Payer: Medicare Other | Admitting: Physician Assistant

## 2017-03-03 ENCOUNTER — Encounter: Payer: Self-pay | Admitting: Physician Assistant

## 2017-03-03 VITALS — BP 120/78 | HR 87 | Temp 97.8°F | Resp 14 | Wt 144.6 lb

## 2017-03-03 DIAGNOSIS — J208 Acute bronchitis due to other specified organisms: Secondary | ICD-10-CM | POA: Diagnosis not present

## 2017-03-03 DIAGNOSIS — B9689 Other specified bacterial agents as the cause of diseases classified elsewhere: Secondary | ICD-10-CM | POA: Diagnosis not present

## 2017-03-03 MED ORDER — DOXYCYCLINE HYCLATE 100 MG PO CAPS: 100.0000 mg | ORAL_CAPSULE | Freq: Two times a day (BID) | ORAL | 0 refills | Status: DC

## 2017-03-03 NOTE — Progress Notes (Signed)
Patient presents to clinic today c/o 1 week of cough and chest congestion. Endorses cough is productive of white sputum. Notes nasal congestion, fatigue.  Notes some chest tightness and wheezing.  Denies ear pain, sinus pain, tooth pain or headache. Denies nausea/vomtiing. Denies chest pain. Has not used Flovent in > 24 hours. Does not always use as directed.  Past Medical History:  Diagnosis Date  . Arthritis    hands   . BPH (benign prostatic hyperplasia)   . COPD (chronic obstructive pulmonary disease) ([place])   . Coronary artery disease   . CVA (cerebral infarction) 3 years ago   mild stroke  . HLD (hyperlipidemia)   . Hypertension   . Osteoporosis   . Pneumonia     Current Outpatient Medications on File Prior to Visit  Medication Sig Dispense Refill  . alendronate (FOSAMAX) 70 MG tablet Take 1 tablet (70 mg total) by mouth once a week. TAKE WITH A FULL GLASS OF WATER ON AT NOON TIME   EMPTY STOMACH. 12 tablet 3  . aspirin EC 81 MG tablet Take 81 mg by mouth daily.    [name] atorvastatin (LIPITOR) 20 MG tablet TAKE 1 TABLET (20 MG TOTAL) BY MOUTH DAILY. 90 tablet 1  . Cholecalciferol (D3-1000 PO) Take 2,000 Units by mouth.    . docusate sodium (COLACE) 100 MG capsule Take 100 mg by mouth as needed for mild constipation. Reported on 04/16/2015    . FLOVENT HFA 110 MCG/ACT inhaler USE 2 INHALATIONS TWICE A DAY 36 g 1  . ipratropium (ATROVENT HFA) 17 MCG/ACT inhaler Inhale 2 puffs into the lungs every 6 (six) hours as needed for wheezing. 1 Inhaler 12  . losartan (COZAAR) 100 MG tablet Take 1 tablet (100 mg total) by mouth daily. 90 tablet 1   No current facility-administered medications on file prior to visit.     No Known Allergies  Family History  Problem Relation Age of Onset  . Colon cancer Brother     Social History   Socioeconomic History  . Marital status: Widowed    Spouse name: Not on file  . Number of children: Not on file  . Years of education: Not on file  .  Highest education level: Not on file  Social Needs  . Financial resource strain: Not on file  . Food insecurity - worry: Not on file  . Food insecurity - inability: Not on file  . Transportation needs - medical: Not on file  . Transportation needs - non-medical: Not on file  Occupational History  . Occupation: Retired  Tobacco Use  . Smoking status: Former Smoker    Packs/day: 1.00    Years: 35.00    Pack years: 35.00    Types: Cigarettes    Last attempt to quit: 02/29/1988    Years since quitting: 29.0  . Smokeless tobacco: Never Used  Substance and Sexual Activity  . Alcohol use: No    Alcohol/week: 0.0 oz  . Drug use: No  . Sexual activity: Not on file  Other Topics Concern  . Not on file  Social History Narrative  . Not on file   Review of Systems - See HPI.  All other ROS are negative.  There were no vitals taken for this visit.  Physical Exam  Constitutional: He is oriented to person, place, and time and well-developed, well-nourished, and in no distress.  HENT:  Head: Normocephalic and atraumatic.  Right Ear: Tympanic membrane normal.  Left Ear: Tympanic membrane  normal.  Nose: Nose normal.  Mouth/Throat: Uvula is midline, oropharynx is clear and moist and mucous membranes are normal.  Eyes: Conjunctivae are normal.  Neck: Neck supple.  Cardiovascular: Normal rate, regular rhythm, normal heart sounds and intact distal pulses.  Pulmonary/Chest: Effort normal and breath sounds normal. No respiratory distress. He has no wheezes. He has no rales. He exhibits no tenderness.  Lymphadenopathy:    He has no cervical adenopathy.  Neurological: He is alert and oriented to person, place, and time.  Skin: Skin is warm and dry. No rash noted.  Psychiatric: Affect normal.  Vitals reviewed.  Assessment/Plan: 1. Acute bacterial bronchitis Rx Doxycycline.  Increase fluids.  Rest.  Saline nasal spray.  Probiotic.  Mucinex as directed.  Humidifier in bedroom. Restart Flovent.   Call or return to clinic if symptoms are not improving.  - doxycycline (VIBRAMYCIN) 100 MG capsule; Take 1 capsule (100 mg total) by mouth 2 (two) times daily.  Dispense: 14 capsule; Refill: 0   Leeanne Rio, PA-C

## 2017-03-03 NOTE — Patient Instructions (Addendum)
Take antibiotic (Doxycycline) as directed.  Increase fluids.  Get plenty of rest. Use Mucinex for congestion. Take Flovent twice daily as directed.Take a daily probiotic (I recommend Align or Culturelle, but even Activia Yogurt may be beneficial).  A humidifier placed in the bedroom may offer some relief for a dry, scratchy throat of nasal irritation.  Read information below on acute bronchitis. Please call or return to clinic if symptoms are not improving.  Acute Bronchitis Bronchitis is when the airways that extend from the windpipe into the lungs get red, puffy, and painful (inflamed). Bronchitis often causes thick spit (mucus) to develop. This leads to a cough. A cough is the most common symptom of bronchitis. In acute bronchitis, the condition usually begins suddenly and goes away over time (usually in 2 weeks). Smoking, allergies, and asthma can make bronchitis worse. Repeated episodes of bronchitis may cause more lung problems.  HOME CARE  Rest.  Drink enough fluids to keep your pee (urine) clear or pale yellow (unless you need to limit fluids as told by your doctor).  Only take over-the-counter or prescription medicines as told by your doctor.  Avoid smoking and secondhand smoke. These can make bronchitis worse. If you are a smoker, think about using nicotine gum or skin patches. Quitting smoking will help your lungs heal faster.  Reduce the chance of getting bronchitis again by:  Washing your hands often.  Avoiding people with cold symptoms.  Trying not to touch your hands to your mouth, nose, or eyes.  Follow up with your doctor as told.  GET HELP IF: Your symptoms do not improve after 1 week of treatment. Symptoms include:  Cough.  Fever.  Coughing up thick spit.  Body aches.  Chest congestion.  Chills.  Shortness of breath.  Sore throat.  GET HELP RIGHT AWAY IF:   You have an increased fever.  You have chills.  You have severe shortness of breath.  You  have bloody thick spit (sputum).  You throw up (vomit) often.  You lose too much body fluid (dehydration).  You have a severe headache.  You faint.  MAKE SURE YOU:   Understand these instructions.  Will watch your condition.  Will get help right away if you are not doing well or get worse. Document Released: 08/03/2007 Document Revised: 10/17/2012 Document Reviewed: 08/07/2012 [facility] Patient Information 2015 [place], [place]. This information is not intended to replace advice given to you by your health care provider. Make sure you discuss any questions you have with your health care provider.

## 2017-04-24 ENCOUNTER — Encounter: Payer: Self-pay | Admitting: Family Medicine

## 2017-04-24 ENCOUNTER — Encounter: Payer: Self-pay | Admitting: General Practice

## 2017-04-24 ENCOUNTER — Ambulatory Visit (INDEPENDENT_AMBULATORY_CARE_PROVIDER_SITE_OTHER): Payer: Medicare Other | Admitting: Family Medicine

## 2017-04-24 ENCOUNTER — Other Ambulatory Visit: Payer: Self-pay

## 2017-04-24 VITALS — BP 112/78 | HR 96 | Temp 97.9°F | Resp 16 | Ht 68.0 in | Wt 141.5 lb

## 2017-04-24 DIAGNOSIS — H532 Diplopia: Secondary | ICD-10-CM | POA: Diagnosis not present

## 2017-04-24 DIAGNOSIS — E785 Hyperlipidemia, unspecified: Secondary | ICD-10-CM | POA: Diagnosis not present

## 2017-04-24 DIAGNOSIS — I10 Essential (primary) hypertension: Secondary | ICD-10-CM | POA: Diagnosis not present

## 2017-04-24 DIAGNOSIS — I6523 Occlusion and stenosis of bilateral carotid arteries: Secondary | ICD-10-CM | POA: Diagnosis not present

## 2017-04-24 LAB — HEPATIC FUNCTION PANEL
ALT: 22 U/L (ref 0–53)
AST: 18 U/L (ref 0–37)
Albumin: 4.3 g/dL (ref 3.5–5.2)
Alkaline Phosphatase: 72 U/L (ref 39–117)
Bilirubin, Direct: 0.3 mg/dL (ref 0.0–0.3)
TOTAL PROTEIN: 6.3 g/dL (ref 6.0–8.3)
Total Bilirubin: 1.4 mg/dL — ABNORMAL HIGH (ref 0.2–1.2)

## 2017-04-24 LAB — CBC WITH DIFFERENTIAL/PLATELET
BASOS ABS: 0 10*3/uL (ref 0.0–0.1)
Basophils Relative: 0.2 % (ref 0.0–3.0)
EOS ABS: 0.2 10*3/uL (ref 0.0–0.7)
Eosinophils Relative: 1.3 % (ref 0.0–5.0)
HEMATOCRIT: 50.7 % (ref 39.0–52.0)
HEMOGLOBIN: 16.9 g/dL (ref 13.0–17.0)
LYMPHS PCT: 20.1 % (ref 12.0–46.0)
Lymphs Abs: 2.4 10*3/uL (ref 0.7–4.0)
MCHC: 33.3 g/dL (ref 30.0–36.0)
MCV: 92.4 fl (ref 78.0–100.0)
MONOS PCT: 7.1 % (ref 3.0–12.0)
Monocytes Absolute: 0.9 10*3/uL (ref 0.1–1.0)
Neutro Abs: 8.6 10*3/uL — ABNORMAL HIGH (ref 1.4–7.7)
Neutrophils Relative %: 71.3 % (ref 43.0–77.0)
Platelets: 223 10*3/uL (ref 150.0–400.0)
RBC: 5.48 Mil/uL (ref 4.22–5.81)
RDW: 14.7 % (ref 11.5–15.5)
WBC: 12.1 10*3/uL — AB (ref 4.0–10.5)

## 2017-04-24 LAB — BASIC METABOLIC PANEL
BUN: 18 mg/dL (ref 6–23)
CALCIUM: 10.4 mg/dL (ref 8.4–10.5)
CO2: 29 mEq/L (ref 19–32)
CREATININE: 0.88 mg/dL (ref 0.40–1.50)
Chloride: 101 mEq/L (ref 96–112)
GFR: 87.8 mL/min (ref >60.00)
GLUCOSE: 101 mg/dL — AB (ref 70–99)
Potassium: 5.1 mEq/L (ref 3.5–5.1)
SODIUM: 140 meq/L (ref 135–145)

## 2017-04-24 LAB — LIPID PANEL
Cholesterol: 115 mg/dL (ref 0–200)
HDL: 41.8 mg/dL (ref >39.00)
LDL CALC: 62 mg/dL (ref 0–99)
NONHDL: 72.95
Total CHOL/HDL Ratio: 3
Triglycerides: 56 mg/dL (ref 0.0–149.0)
VLDL: 11.2 mg/dL (ref 0.0–40.0)

## 2017-04-24 LAB — TSH: TSH: 1.66 u[IU]/mL (ref 0.35–4.50)

## 2017-04-24 NOTE — Patient Instructions (Signed)
Follow up in 1 month to recheck BP and dizziness We'll notify you of your lab results and make any changes if needed DECREASE your Losartan to 1/2 tab daily We'll call you with your eye appt and your carotid doppler appt Drink plenty of water!!  This will help w/ dizziness Change positions slowly to help w/ dizziness Call with any questions or concerns [place]!

## 2017-04-24 NOTE — Progress Notes (Signed)
   Subjective:    Patient ID: [name], male    DOB: 11-18-1931, 84 y.o.   MRN: [id number]  HPI HTN- chronic problem, on Losartan 100mg  daily w/ excellent BP control.  Pt reports feeling dizzy x2-3 weeks, worse in the morning.  No change in sxs based on position changes.  sxs improve as day goes on.  No CP, SOB, HAs, edema.  Hyperlipidemia- chronic problem, on Lipitor 20mg  daily.  No abd pain, N/V.  Double vision- pt reports sxs started 'a couple months ago'.   Review of Systems For ROS see HPI     Objective:   Physical Exam  Constitutional: He is oriented to person, place, and time. He appears well-developed and well-nourished. No distress.  HENT:  Head: Normocephalic and atraumatic.  Eyes: Conjunctivae and EOM are normal. Pupils are equal, round, and reactive to light.  Neck: Normal range of motion. Neck supple. No thyromegaly present.  Cardiovascular: Normal rate, regular rhythm, normal heart sounds and intact distal pulses.  No murmur heard. Pulmonary/Chest: Effort normal and breath sounds normal. No respiratory distress.  Abdominal: Soft. Bowel sounds are normal. He exhibits no distension.  Musculoskeletal: He exhibits no edema.  Lymphadenopathy:    He has no cervical adenopathy.  Neurological: He is alert and oriented to person, place, and time. No cranial nerve deficit.  No nystagmus but pt reports '2 fingers' when tracking  Skin: Skin is warm and dry.  Psychiatric: He has a normal mood and affect. His behavior is normal.  Vitals reviewed.         Assessment & Plan:  Double vision- new.  Unclear if this is causing the dizziness or this is a separate issue.  Has not had recent eye exam.  Refer for eye exam.  Get carotid dopplers.  Will follow closely.

## 2017-04-24 NOTE — Assessment & Plan Note (Signed)
Chronic problem.  Possibly over controlled today.  Pt now remembers that the dizziness may have started when his med was switched from Valsartan to Losartan.  Will decrease Losartan to 50mg  daily (1/2 tab) and monitor closely for BP control and resolution of dizziness.  Pt expressed understanding and is in agreement w/ plan.

## 2017-04-24 NOTE — Assessment & Plan Note (Signed)
Chronic problem.  Tolerating statin w/o difficulty.  Check labs.  Adjust meds prn  

## 2017-04-24 NOTE — Assessment & Plan Note (Signed)
Pt has hx of this.  Has not had carotid dopplers recently.  Given his new blurry vision and dizziness, will repeat Dopplers.  Pt expressed understanding and is in agreement w/ plan.

## 2017-04-25 ENCOUNTER — Encounter (HOSPITAL_BASED_OUTPATIENT_CLINIC_OR_DEPARTMENT_OTHER): Payer: Self-pay

## 2017-04-25 ENCOUNTER — Ambulatory Visit (HOSPITAL_BASED_OUTPATIENT_CLINIC_OR_DEPARTMENT_OTHER)
Admission: RE | Admit: 2017-04-25 | Discharge: 2017-04-25 | Disposition: A | Discharge status: Home / Self Care | Payer: Medicare Other | Source: Ambulatory Visit | Attending: Family Medicine | Admitting: Family Medicine

## 2017-04-25 DIAGNOSIS — H532 Diplopia: Secondary | ICD-10-CM | POA: Diagnosis not present

## 2017-04-25 DIAGNOSIS — E785 Hyperlipidemia, unspecified: Secondary | ICD-10-CM | POA: Insufficient documentation

## 2017-04-25 DIAGNOSIS — Z8679 Personal history of other diseases of the circulatory system: Secondary | ICD-10-CM | POA: Diagnosis not present

## 2017-04-25 DIAGNOSIS — I10 Essential (primary) hypertension: Secondary | ICD-10-CM | POA: Diagnosis not present

## 2017-04-25 DIAGNOSIS — I6523 Occlusion and stenosis of bilateral carotid arteries: Secondary | ICD-10-CM | POA: Diagnosis not present

## 2017-04-26 ENCOUNTER — Other Ambulatory Visit: Payer: Self-pay | Admitting: Family Medicine

## 2017-04-26 DIAGNOSIS — H353132 Nonexudative age-related macular degeneration, bilateral, intermediate dry stage: Secondary | ICD-10-CM | POA: Diagnosis not present

## 2017-04-26 DIAGNOSIS — E041 Nontoxic single thyroid nodule: Secondary | ICD-10-CM

## 2017-04-26 DIAGNOSIS — H532 Diplopia: Secondary | ICD-10-CM | POA: Diagnosis not present

## 2017-04-26 DIAGNOSIS — Z961 Presence of intraocular lens: Secondary | ICD-10-CM | POA: Diagnosis not present

## 2017-04-26 DIAGNOSIS — H35033 Hypertensive retinopathy, bilateral: Secondary | ICD-10-CM | POA: Diagnosis not present

## 2017-04-27 ENCOUNTER — Ambulatory Visit (HOSPITAL_BASED_OUTPATIENT_CLINIC_OR_DEPARTMENT_OTHER)
Admission: RE | Admit: 2017-04-27 | Discharge: 2017-04-27 | Disposition: A | Discharge status: Home / Self Care | Payer: Medicare Other | Source: Ambulatory Visit | Attending: Family Medicine | Admitting: Family Medicine

## 2017-04-27 ENCOUNTER — Other Ambulatory Visit (HOSPITAL_BASED_OUTPATIENT_CLINIC_OR_DEPARTMENT_OTHER): Payer: Medicare Other

## 2017-04-27 DIAGNOSIS — E041 Nontoxic single thyroid nodule: Secondary | ICD-10-CM | POA: Insufficient documentation

## 2017-04-28 ENCOUNTER — Other Ambulatory Visit: Payer: Self-pay | Admitting: Family Medicine

## 2017-04-28 DIAGNOSIS — E041 Nontoxic single thyroid nodule: Secondary | ICD-10-CM

## 2017-04-29 ENCOUNTER — Other Ambulatory Visit: Payer: Self-pay | Admitting: Family Medicine

## 2017-05-11 DIAGNOSIS — L723 Sebaceous cyst: Secondary | ICD-10-CM | POA: Diagnosis not present

## 2017-05-11 DIAGNOSIS — E041 Nontoxic single thyroid nodule: Secondary | ICD-10-CM | POA: Diagnosis not present

## 2017-05-11 DIAGNOSIS — L720 Epidermal cyst: Secondary | ICD-10-CM | POA: Diagnosis not present

## 2017-05-24 DIAGNOSIS — H532 Diplopia: Secondary | ICD-10-CM | POA: Diagnosis not present

## 2017-05-31 ENCOUNTER — Telehealth: Payer: Self-pay | Admitting: Endocrinology

## 2017-05-31 NOTE — Telephone Encounter (Signed)
Patient came in-he needs new RX Alendronate Sod Tabs 4's 70 mg (generic for Fosamax Tabs 4's)-sent to [facility] asap-he is completely out of medication

## 2017-06-01 ENCOUNTER — Other Ambulatory Visit: Payer: Self-pay

## 2017-06-01 ENCOUNTER — Other Ambulatory Visit: Payer: Self-pay | Admitting: Endocrinology

## 2017-06-01 NOTE — Telephone Encounter (Signed)
Please refill prn 

## 2017-06-01 NOTE — Telephone Encounter (Signed)
I have sent.

## 2017-06-02 ENCOUNTER — Ambulatory Visit (INDEPENDENT_AMBULATORY_CARE_PROVIDER_SITE_OTHER): Payer: Medicare Other | Admitting: Family Medicine

## 2017-06-02 ENCOUNTER — Other Ambulatory Visit: Payer: Self-pay

## 2017-06-02 ENCOUNTER — Encounter: Payer: Self-pay | Admitting: Family Medicine

## 2017-06-02 VITALS — BP 119/83 | HR 72 | Temp 97.9°F | Resp 16 | Ht 68.0 in | Wt 146.4 lb

## 2017-06-02 DIAGNOSIS — E162 Hypoglycemia, unspecified: Secondary | ICD-10-CM | POA: Diagnosis not present

## 2017-06-02 DIAGNOSIS — R42 Dizziness and giddiness: Secondary | ICD-10-CM

## 2017-06-02 DIAGNOSIS — I10 Essential (primary) hypertension: Secondary | ICD-10-CM | POA: Diagnosis not present

## 2017-06-02 DIAGNOSIS — I6523 Occlusion and stenosis of bilateral carotid arteries: Secondary | ICD-10-CM

## 2017-06-02 LAB — BASIC METABOLIC PANEL
BUN: 15 mg/dL (ref 6–23)
CALCIUM: 9.5 mg/dL (ref 8.4–10.5)
CO2: 29 mEq/L (ref 19–32)
Chloride: 106 mEq/L (ref 96–112)
Creatinine, Ser: 0.72 mg/dL (ref 0.40–1.50)
GFR: 110.66 mL/min (ref >60.00)
GLUCOSE: 66 mg/dL — AB (ref 70–99)
Potassium: 4.6 mEq/L (ref 3.5–5.1)
SODIUM: 142 meq/L (ref 135–145)

## 2017-06-02 LAB — HEMOGLOBIN A1C: Hgb A1c MFr Bld: 6.4 % (ref 4.6–6.5)

## 2017-06-02 NOTE — Patient Instructions (Signed)
Follow up in August to recheck BP and cholesterol We'll notify you of your lab results and make any changes if needed I think your dizziness is b/c your sugar is dropping low- please make sure you are adding a snack around 9:30-10am to help with the dizziness.  If this doesn't work, please let me know! Continue the Losartan 1 tab daily Call with any questions or concerns [place]!!!

## 2017-06-02 NOTE — Assessment & Plan Note (Signed)
Chronic problem.  Pt's Losartan dose was lowered last month to 1/2 tab but at 2 separate appts, pt's BP was elevated so he increased back to 1 tab daily.  BP is well controlled today.  No changes at this time

## 2017-06-02 NOTE — Progress Notes (Signed)
   Subjective:    Patient ID: [name], male    DOB: [birth year], 84 y.o.   MRN: [id number]  HPI Double vision- resolved.  Saw Dr Herbert Deaner.  Thyroid nodule- pt saw Dr Erik Obey on 8/27 and they recommended repeat US in 6 months, no bx at this time.  Dizziness- no improvement.  Pt reports sxs are the worst around 11am.  sxs occur when he is up and about.  Pt eats breakfast at 6 am.  Eats lunch at 12.  No snacks in between.  sxs improve w/ eating  HTN- chronic problem, at last visit Losartan was decreased to 1/2 tab.  He had 2 doctor's appts in which BP was running high so he increased back to 1 tab daily.   Review of Systems For ROS see HPI     Objective:   Physical Exam  Constitutional: He is oriented to person, place, and time. He appears well-developed and well-nourished. No distress.  HENT:  Head: Normocephalic and atraumatic.  Eyes: Pupils are equal, round, and reactive to light. Conjunctivae and EOM are normal.  Neck: Normal range of motion. Neck supple.  Cardiovascular: Normal rate, regular rhythm, normal heart sounds and intact distal pulses.  No murmur heard. Pulmonary/Chest: Effort normal and breath sounds normal. No respiratory distress.  Abdominal: Soft. Bowel sounds are normal. He exhibits no distension.  Musculoskeletal: He exhibits no edema.  Lymphadenopathy:    He has no cervical adenopathy.  Neurological: He is alert and oriented to person, place, and time. No cranial nerve deficit.  Skin: Skin is warm and dry.  Psychiatric: He has a normal mood and affect. His behavior is normal.  Vitals reviewed.         Assessment & Plan:  Dizziness- pt was able to give more information today regarding his dizziness.  It is the worst around 11am.  Seems to improve w/ eating.  He eats breakfast at 6am and doesn't eat lunch until noon.  Suspect that his dizziness is actually episodes of hypoglycemia.  Discussed this with him and recommended a mid morning snack to see if sxs  improve.  If not, will pursue additional workup.  Will follow closely.

## 2017-07-06 ENCOUNTER — Other Ambulatory Visit: Payer: Self-pay | Admitting: Family Medicine

## 2017-07-10 ENCOUNTER — Other Ambulatory Visit: Payer: Self-pay | Admitting: Endocrinology

## 2017-07-10 ENCOUNTER — Other Ambulatory Visit: Payer: Self-pay

## 2017-08-07 ENCOUNTER — Other Ambulatory Visit: Payer: Self-pay | Admitting: Family Medicine

## 2017-08-10 ENCOUNTER — Ambulatory Visit (INDEPENDENT_AMBULATORY_CARE_PROVIDER_SITE_OTHER): Payer: Medicare Other

## 2017-08-10 DIAGNOSIS — M810 Age-related osteoporosis without current pathological fracture: Secondary | ICD-10-CM

## 2017-08-10 MED ORDER — DENOSUMAB 60 MG/ML ~~LOC~~ SOSY
60.0000 mg | PREFILLED_SYRINGE | Freq: Once | SUBCUTANEOUS | Status: AC
  Administered 2017-08-10: 60 mg via SUBCUTANEOUS

## 2017-08-31 ENCOUNTER — Emergency Department (HOSPITAL_COMMUNITY): Payer: Medicare Other

## 2017-08-31 ENCOUNTER — Encounter (HOSPITAL_COMMUNITY): Payer: Self-pay

## 2017-08-31 ENCOUNTER — Other Ambulatory Visit: Payer: Self-pay

## 2017-08-31 ENCOUNTER — Emergency Department (HOSPITAL_COMMUNITY)
Admission: EM | Admit: 2017-08-31 | Discharge: 2017-09-01 | Disposition: A | Discharge status: Home / Self Care | Payer: Medicare Other | Attending: Emergency Medicine | Admitting: Emergency Medicine

## 2017-08-31 DIAGNOSIS — Z7982 Long term (current) use of aspirin: Secondary | ICD-10-CM | POA: Insufficient documentation

## 2017-08-31 DIAGNOSIS — I10 Essential (primary) hypertension: Secondary | ICD-10-CM

## 2017-08-31 DIAGNOSIS — R519 Headache, unspecified: Secondary | ICD-10-CM

## 2017-08-31 DIAGNOSIS — R404 Transient alteration of awareness: Secondary | ICD-10-CM | POA: Diagnosis not present

## 2017-08-31 DIAGNOSIS — H5711 Ocular pain, right eye: Secondary | ICD-10-CM | POA: Diagnosis present

## 2017-08-31 DIAGNOSIS — Z79899 Other long term (current) drug therapy: Secondary | ICD-10-CM | POA: Insufficient documentation

## 2017-08-31 DIAGNOSIS — J441 Chronic obstructive pulmonary disease with (acute) exacerbation: Secondary | ICD-10-CM | POA: Diagnosis not present

## 2017-08-31 DIAGNOSIS — I2510 Atherosclerotic heart disease of native coronary artery without angina pectoris: Secondary | ICD-10-CM | POA: Diagnosis not present

## 2017-08-31 DIAGNOSIS — R51 Headache: Secondary | ICD-10-CM | POA: Diagnosis not present

## 2017-08-31 DIAGNOSIS — Z87891 Personal history of nicotine dependence: Secondary | ICD-10-CM | POA: Insufficient documentation

## 2017-08-31 LAB — I-STAT CHEM 8, ED
BUN: 11 mg/dL (ref 8–23)
CREATININE: 0.6 mg/dL — AB (ref 0.61–1.24)
Calcium, Ion: 1.05 mmol/L — ABNORMAL LOW (ref 1.15–1.40)
Chloride: 106 mmol/L (ref 98–111)
GLUCOSE: 143 mg/dL — AB (ref 70–99)
HEMATOCRIT: 44 % (ref 39.0–52.0)
HEMOGLOBIN: 15 g/dL (ref 13.0–17.0)
Potassium: 3.8 mmol/L (ref 3.5–5.1)
Sodium: 142 mmol/L (ref 135–145)
TCO2: 22 mmol/L (ref 22–32)

## 2017-08-31 LAB — URINALYSIS, ROUTINE W REFLEX MICROSCOPIC
BILIRUBIN URINE: NEGATIVE
GLUCOSE, UA: NEGATIVE mg/dL
HGB URINE DIPSTICK: NEGATIVE
KETONES UR: NEGATIVE mg/dL
LEUKOCYTES UA: NEGATIVE
Nitrite: NEGATIVE
PH: 7 (ref 5.0–8.0)
Protein, ur: NEGATIVE mg/dL
Specific Gravity, Urine: 1.012 (ref 1.005–1.030)

## 2017-08-31 LAB — DIFFERENTIAL
ABS IMMATURE GRANULOCYTES: 0.1 10*3/uL (ref 0.0–0.1)
BASOS ABS: 0 10*3/uL (ref 0.0–0.1)
Basophils Relative: 0 %
Eosinophils Absolute: 0.1 10*3/uL (ref 0.0–0.7)
Eosinophils Relative: 1 %
Immature Granulocytes: 1 %
LYMPHS PCT: 26 %
Lymphs Abs: 2.8 10*3/uL (ref 0.7–4.0)
Monocytes Absolute: 0.8 10*3/uL (ref 0.1–1.0)
Monocytes Relative: 7 %
NEUTROS ABS: 7.1 10*3/uL (ref 1.7–7.7)
NEUTROS PCT: 65 %

## 2017-08-31 LAB — RAPID URINE DRUG SCREEN, HOSP PERFORMED
AMPHETAMINES: NOT DETECTED
BENZODIAZEPINES: NOT DETECTED
COCAINE: NOT DETECTED
OPIATES: NOT DETECTED
TETRAHYDROCANNABINOL: NOT DETECTED

## 2017-08-31 LAB — CBC
HEMATOCRIT: 45.1 % (ref 39.0–52.0)
HEMOGLOBIN: 14.3 g/dL (ref 13.0–17.0)
MCH: 30.6 pg (ref 26.0–34.0)
MCHC: 31.7 g/dL (ref 30.0–36.0)
MCV: 96.4 fL (ref 78.0–100.0)
Platelets: 208 10*3/uL (ref 150–400)
RBC: 4.68 MIL/uL (ref 4.22–5.81)
RDW: 13.6 % (ref 11.5–15.5)
WBC: 10.9 10*3/uL — ABNORMAL HIGH (ref 4.0–10.5)

## 2017-08-31 LAB — COMPREHENSIVE METABOLIC PANEL
ALBUMIN: 3.5 g/dL (ref 3.5–5.0)
ALT: 24 U/L (ref 0–44)
AST: 20 U/L (ref 15–41)
Alkaline Phosphatase: 57 U/L (ref 38–126)
Anion gap: 7 (ref 5–15)
BUN: 11 mg/dL (ref 8–23)
CALCIUM: 8.4 mg/dL — AB (ref 8.9–10.3)
CO2: 24 mmol/L (ref 22–32)
CREATININE: 0.73 mg/dL (ref 0.61–1.24)
Chloride: 109 mmol/L (ref 98–111)
GFR calc non Af Amer: >60 mL/min (ref >60)
Glucose, Bld: 150 mg/dL — ABNORMAL HIGH (ref 70–99)
Potassium: 4 mmol/L (ref 3.5–5.1)
SODIUM: 140 mmol/L (ref 135–145)
Total Bilirubin: 0.9 mg/dL (ref 0.3–1.2)
Total Protein: 5.4 g/dL — ABNORMAL LOW (ref 6.5–8.1)

## 2017-08-31 LAB — C-REACTIVE PROTEIN: CRP: <0.8 mg/dL (ref <1.0)

## 2017-08-31 LAB — I-STAT TROPONIN, ED: Troponin i, poc: 0 ng/mL (ref 0.00–0.08)

## 2017-08-31 LAB — ETHANOL: Alcohol, Ethyl (B): <10 mg/dL (ref <10)

## 2017-08-31 LAB — PROTIME-INR
INR: 1.08
Prothrombin Time: 13.9 seconds (ref 11.4–15.2)

## 2017-08-31 LAB — CBG MONITORING, ED: Glucose-Capillary: 132 mg/dL — ABNORMAL HIGH (ref 70–99)

## 2017-08-31 LAB — APTT: aPTT: 27 seconds (ref 24–36)

## 2017-08-31 MED ORDER — IOPAMIDOL (ISOVUE-370) INJECTION 76%
50.0000 mL | Freq: Once | INTRAVENOUS | Status: AC | PRN
  Administered 2017-08-31: 50 mL via INTRAVENOUS

## 2017-08-31 MED ORDER — IOPAMIDOL (ISOVUE-370) INJECTION 76%
INTRAVENOUS | Status: AC
  Filled 2017-08-31: qty 50

## 2017-08-31 MED ORDER — ACETAMINOPHEN 500 MG PO TABS
1000.0000 mg | ORAL_TABLET | Freq: Once | ORAL | Status: AC
  Administered 2017-08-31: 1000 mg via ORAL
  Filled 2017-08-31: qty 2

## 2017-08-31 NOTE — ED Provider Notes (Signed)
[facility] EMERGENCY DEPARTMENT Provider Note   CSN: [id number] Arrival date & time: 08/31/17  1940     History   Chief Complaint Chief Complaint  Patient presents with  . Transient Ischemic Attack  . Eye Pain    HPI [name] is a 84 y.o. male.  84 year old male with past medical history including COPD, hypertension, hyperlipidemia, CVA who presents with pain behind his right eye.  Around 640 this evening, the patient was sitting at dinner with his family when he began having sharp, shocklike pain in his right frontal head just above his right eye.  His face scrunched up on the right side and family notes that for 10 to 15 seconds he did not say anything although he was awake.  He held up his right arm and they were concerned that it may have been twitching.  They called EMS and he had 2 more episodes in the ambulance each lasting a few seconds.  Ring the episodes he was able to talk but he states that he had the shocklike pain again and they note that his face again seem to twitch on the right side.  He denies any visual changes.  Currently his headache is very mild.  He denies any extremity weakness, numbness, speech problems, head injury, fevers, or recent illness.  Family concerned about the possibility of TIA as he has history of TIA but during that presentation he had unilateral weakness.  Family also concerned about his elevated lead pressure.  The patient states that this has been an ongoing problem and he follows with his PCP for this.  He reports compliance with his medications.  The history is provided by the patient.  Eye Pain     Past Medical History:  Diagnosis Date  . Arthritis    hands   . BPH (benign prostatic hyperplasia)   . COPD (chronic obstructive pulmonary disease) ([place])   . Coronary artery disease   . CVA (cerebral infarction) 3 years ago   mild stroke  . HLD (hyperlipidemia)   . Hypertension   . Osteoporosis   . Pneumonia      Patient Active Problem List   Diagnosis Date Noted  . Physical exam 10/08/2014  . Facial skin lesion 04/08/2014  . COPD (chronic obstructive pulmonary disease) ([place]) 12/04/2013  . Elevated WBCs 11/22/2013  . HTN (hypertension) 10/15/2013  . Osteoporosis 10/15/2013  . History of CVA (cerebrovascular accident) 10/15/2013  . History of compression fracture of spine 10/15/2013  . Recurrent pneumonia 10/15/2013  . Hyperlipidemia 10/15/2013  . Low testosterone 10/15/2013  . Carotid stenosis 10/15/2013    Past Surgical History:  Procedure Laterality Date  . BACK SURGERY    . LAPAROSCOPIC APPENDECTOMY N/A 11/02/2013   Procedure: APPENDECTOMY LAPAROSCOPIC;  Surgeon: Donnie Mesa, MD;  Location: [facility];  Service: General;  Laterality: N/A;        Home Medications    Prior to Admission medications   Medication Sig Start Date End Date Taking? Authorizing Provider  acetaminophen (TYLENOL) 500 MG tablet Take 500-1,000 mg by mouth every 8 (eight) hours as needed for mild pain or headache.    Yes [provider]  alendronate (FOSAMAX) 70 MG tablet Take 1 tablet (70 mg total) by mouth once a week. TAKE WITH A FULL GLASS OF WATER ON AT NOON TIME   EMPTY STOMACH. Patient taking differently: Take 70 mg by mouth See admin instructions. Take 70 mg by mouth once a week on Mondays/take  with a full glass of water on an empty stomach 06/01/17  Yes Renato Shin, MD  aspirin EC 81 MG tablet Take 81 mg by mouth daily.   Yes [provider]  atorvastatin (LIPITOR) 20 MG tablet TAKE 1 TABLET (20 MG TOTAL) BY MOUTH DAILY. 08/07/17  Yes [name], MD  bisacodyl (DULCOLAX) 5 MG EC tablet Take 5 mg by mouth daily as needed for mild constipation or moderate constipation.   Yes [provider]  Cholecalciferol (VITAMIN D-3) 1000 units CAPS Take 2,000 Units by mouth daily.   Yes [provider]  diphenhydrAMINE (BENADRYL) 25 MG tablet Take 25 mg by mouth every 6 (six)  hours as needed (for seasonal allergies).   Yes [provider]  diphenhydramine-acetaminophen (TYLENOL PM) 25-500 MG TABS tablet Take 1 tablet by mouth at bedtime as needed (for sleep or pain).   Yes [provider]  FLOVENT HFA 110 MCG/ACT inhaler USE 2 INHALATIONS TWICE A DAY Patient taking differently: Inhale 2 puffs into the lungs two times a day as needed for flares 07/06/17  Yes [name], MD  losartan (COZAAR) 100 MG tablet Take 1 tablet (100 mg total) by mouth daily. 05/01/17  Yes [name], MD  Multiple Vitamins-Minerals (PRESERVISION AREDS 2) CAPS Take 1 capsule by mouth daily.    Yes [provider]  Polyethyl Glycol-Propyl Glycol (SYSTANE) 0.4-0.3 % SOLN Place 2 drops into both eyes 3 (three) times daily as needed.   Yes [provider]  alendronate (FOSAMAX) 70 MG tablet TAKE 1 TABLET ONCE A WEEK WITH FULL GLASS OF WATER AT NOON TIME ON AN EMPTY STOMACH Patient not taking: Reported on 08/31/2017 07/10/17   Renato Shin, MD  ipratropium (ATROVENT HFA) 17 MCG/ACT inhaler Inhale 2 puffs into the lungs every 6 (six) hours as needed for wheezing. Patient not taking: Reported on 08/31/2017 12/03/14   Saguier, Percell Miller, PA-C    Family History Family History  Problem Relation Age of Onset  . Colon cancer Brother     Social History Social History   Tobacco Use  . Smoking status: Former Smoker    Packs/day: 2.00    Years: 20.00    Pack years: 40.00    Types: Cigarettes    Last attempt to quit: 02/29/1988    Years since quitting: 29.5  . Smokeless tobacco: Never Used  Substance Use Topics  . Alcohol use: No    Alcohol/week: 0.0 oz  . Drug use: No     Allergies   Patient has no known allergies.   Review of Systems Review of Systems  Eyes: Positive for pain.   All other systems reviewed and are negative except that which was mentioned in HPI   Physical Exam Updated Vital Signs BP (!) 171/86   Pulse 72   Temp 98.3 F  (36.8 C) (Oral)   Resp 15   Ht 6' (1.829 m)   Wt 65.8 kg (145 lb)   SpO2 95%   BMI 19.67 kg/m   Physical Exam  Constitutional: He is oriented to person, place, and time. He appears well-developed and well-nourished. No distress.  Awake, alert  HENT:  Head: Normocephalic and atraumatic.  Eyes: Pupils are equal, round, and reactive to light. Conjunctivae and EOM are normal.  Neck: Neck supple.  Cardiovascular: Normal rate, regular rhythm and normal heart sounds.  No murmur heard. Pulmonary/Chest: Effort normal and breath sounds normal. No respiratory distress.  Abdominal: Soft. Bowel sounds are normal. He exhibits  no distension. There is no tenderness.  Musculoskeletal: He exhibits no edema.  Neurological: He is alert and oriented to person, place, and time. He has normal reflexes. No cranial nerve deficit or sensory deficit. He exhibits normal muscle tone.  Fluent speech, normal finger-to-nose testing, negative pronator drift, no clonus 5/5 strength and normal sensation x all 4 extremities  Skin: Skin is warm and dry.  Psychiatric: He has a normal mood and affect. Judgment and thought content normal.  Nursing note and vitals reviewed.    ED Treatments / Results  Labs (all labs ordered are listed, but only abnormal results are displayed) Labs Reviewed  CBC - Abnormal; Notable for the following components:      Result Value   WBC 10.9 (*)    All other components within normal limits  COMPREHENSIVE METABOLIC PANEL - Abnormal; Notable for the following components:   Glucose, Bld 150 (*)    Calcium 8.4 (*)    Total Protein 5.4 (*)    All other components within normal limits  RAPID URINE DRUG SCREEN, HOSP PERFORMED - Abnormal; Notable for the following components:   Barbiturates   (*)    Value: Result not available. Reagent lot number recalled by manufacturer.   All other components within normal limits  URINALYSIS, ROUTINE W REFLEX MICROSCOPIC - Abnormal; Notable for the  following components:   APPearance HAZY (*)    All other components within normal limits  CBG MONITORING, ED - Abnormal; Notable for the following components:   Glucose-Capillary 132 (*)    All other components within normal limits  I-STAT CHEM 8, ED - Abnormal; Notable for the following components:   Creatinine, Ser 0.60 (*)    Glucose, Bld 143 (*)    Calcium, Ion 1.05 (*)    All other components within normal limits  ETHANOL  PROTIME-INR  APTT  DIFFERENTIAL  C-REACTIVE PROTEIN  SEDIMENTATION RATE  I-STAT TROPONIN, ED    EKG EKG Interpretation  Date/Time:  Thursday August 31 2017 19:49:25 EDT Ventricular Rate:  87 PR Interval:    QRS Duration: 92 QT Interval:  349 QTC Calculation: 420 R Axis:   -33 Text Interpretation:  Sinus arrhythmia Left ventricular hypertrophy Inferior infarct, old Anterior infarct, old similar to previous Confirmed by Theotis Burrow [phone]) on 08/31/2017 9:15:34 PM   Radiology Ct Angio Head W Or Wo Contrast  Result Date: 08/31/2017 CLINICAL DATA:  84 y/o M; severe acute headache, worst headache of life. EXAM: CT ANGIOGRAPHY HEAD TECHNIQUE: Multidetector CT imaging of the head was performed using the standard protocol during bolus administration of intravenous contrast. Multiplanar CT image reconstructions and MIPs were obtained to evaluate the vascular anatomy. CONTRAST:  50 cc Isovue 370 COMPARISON:  08/31/2017 CT of the head. FINDINGS: CTA HEAD Anterior circulation: No significant stenosis, proximal occlusion, aneurysm, or vascular malformation. Extensive calcified plaque of the carotid siphons without significant stenosis. Posterior circulation: Calcified plaque of bilateral V4 segments with moderate to severe stenosis. Mild mid basilar artery stenosis. Venous sinuses: As permitted by contrast timing, patent. Anatomic variants: Small left posterior communicating artery and anterior communicating artery. No right posterior communicating artery identified,  likely hypoplastic or absent. Delayed phase: Right posterior paramedian partially calcified dural nodule just below foramen magnum measuring 12 x 10 mm compatible with meningioma (series 7, image 156). IMPRESSION: 1. No large vessel occlusion, aneurysm, or vascular malformation. 2. Intracranial atherosclerosis with extensive calcified plaque of carotid siphons and vertebral arteries. 3. Bilateral V4 segments of moderate to severe  stenosis. 4. Mild proximal basilar stenosis. 5. Right posterior paramedian 12 mm meningioma just below foramen magnum. Electronically Signed   By: Kristine Garbe M.D.   On: 08/31/2017 23:23   Ct Head Wo Contrast  Result Date: 08/31/2017 CLINICAL DATA:  Headache behind eyes 3 days. EXAM: CT HEAD WITHOUT CONTRAST TECHNIQUE: Contiguous axial images were obtained from the base of the skull through the vertex without intravenous contrast. COMPARISON:  None. FINDINGS: Brain: Ventricles, cisterns and other CSF spaces are within normal. There is mild chronic ischemic microvascular disease. There is no mass, mass effect, shift of midline structures or acute hemorrhage. No evidence of acute infarction. Vascular: No hyperdense vessel or unexpected calcification. Skull: Normal. Negative for fracture or focal lesion. Sinuses/Orbits: No acute finding. Other: None. IMPRESSION: No acute findings. Minimal chronic ischemic microvascular disease. Electronically Signed   By: Marin Olp M.D.   On: 08/31/2017 20:38    Procedures Procedures (including critical care time)  Medications Ordered in ED Medications  iopamidol (ISOVUE-370) 76 % injection (has no administration in time range)  acetaminophen (TYLENOL) tablet 1,000 mg (1,000 mg Oral Given 08/31/17 2015)  iopamidol (ISOVUE-370) 76 % injection 50 mL (50 mLs Intravenous Contrast Given 08/31/17 2240)     Initial Impression / Assessment and Plan / ED Course  I have reviewed the triage vital signs and the nursing notes.  Pertinent  labs & imaging results that were available during my care of the patient were reviewed by me and considered in my medical decision making (see chart for details).     Pt alert, GCS 15 on arrival, hypertensive but other vital signs stable.  He had no deficits on neuro exam.  He had one episode where his pain worsened in front of me and he winced on the right side.  I did not note any rhythmic twitching or any other symptoms to suggest partial seizure.  Episode was very brief.  He states this is the same episode he had several times prior to arrival.  Interestingly, he did seem to have provocation of this episode with finger-to-nose testing when he touched his own nose.  Differential does include trigeminal neuralgia although not classic for it.  Head CT negative acute.  Screening lab work unremarkable.  I discussed with neurologist, Dr. Lorraine Lax, who recommended CTA head and inflammatory markers to evaluate for aneurysm or temporal arteritis.   CRP normal.  CTA shows some chronic changes but no acute findings to explain his symptoms.  Patient remained well-appearing on reassessment and denied any complaints.  His blood pressure has improved without intervention.  Had a long discussion with the patient and his son about the importance of following up with PCP to gradually lower blood pressure over time, as I could cause more harm by rapidly lowering blood pressure with IV medications here.  Regarding his headaches, they have resolved completely with only Tylenol and I have discussed supportive measures at home.  I have extensively reviewed return precautions.  Provided with neurology outpatient contact information if patient continues to have problems with these headaches.  He voiced understanding of plan and return precautions.  Final Clinical Impressions(s) / ED Diagnoses   Final diagnoses:  Right-sided headache  Essential hypertension    ED Discharge Orders    None       Jillyn Stacey, Wenda Overland,  MD 08/31/17 2355

## 2017-08-31 NOTE — ED Triage Notes (Signed)
Pt arrives from home with ems c.o sudden right eye pain while eating dinner, family also reports pt had a period of "zoning out' where he would not respond to anyone for about a minute with right hand and face twitching. Pt has hx TIAs, last one was 6 years ago. No neuro deficits noted upon ems arrival, pt a.o x4 at this time. Pt hypertensive 200/110 to 198/116. Hr 90s NSR. CBG 189, 18G RAC, nad

## 2017-08-31 NOTE — ED Notes (Signed)
ED Provider at bedside. 

## 2017-08-31 NOTE — ED Notes (Signed)
Pt back from CT

## 2017-09-01 ENCOUNTER — Ambulatory Visit (INDEPENDENT_AMBULATORY_CARE_PROVIDER_SITE_OTHER): Payer: Medicare Other | Admitting: Family Medicine

## 2017-09-01 ENCOUNTER — Encounter: Payer: Self-pay | Admitting: Family Medicine

## 2017-09-01 ENCOUNTER — Other Ambulatory Visit: Payer: Self-pay

## 2017-09-01 VITALS — BP 130/82 | HR 95 | Temp 97.7°F | Ht 72.0 in | Wt 144.0 lb

## 2017-09-01 DIAGNOSIS — R519 Headache, unspecified: Secondary | ICD-10-CM

## 2017-09-01 DIAGNOSIS — I10 Essential (primary) hypertension: Secondary | ICD-10-CM | POA: Diagnosis not present

## 2017-09-01 DIAGNOSIS — R51 Headache: Secondary | ICD-10-CM | POA: Diagnosis not present

## 2017-09-01 LAB — SEDIMENTATION RATE: Sed Rate: 2 mm/hr (ref 0–16)

## 2017-09-01 NOTE — Patient Instructions (Signed)
Please return in 1-2 weeks to see Dr. [name] for follow up on blood pressure.  Monitor your headaches over the next several days. IF they are severe or are associated with weakness, slurred speech, double vision, seek immediate medical attention.  If you have any questions or concerns, please don't hesitate to send me a message via MyChart or call the office at [phone]. Thank you for visiting with [place] today! It's our pleasure caring for you.

## 2017-09-01 NOTE — ED Notes (Signed)
Patient verbalizes understanding of discharge instructions/medications. No further questions at this time.

## 2017-09-01 NOTE — Progress Notes (Signed)
Subjective  CC:  Chief Complaint  Patient presents with  . Headache    was seen yesterday in ER. Blood Pressure for 202/110    HPI: [name] is a 84 y.o. male who presents to the office today to address the problems listed above in the chief complaint. I reviewed hospital notes, labs and imaging study reports.  Briefly, 84 year old male with hypertension was evaluated in the ER yesterday with hypertensive response and right sided headache associated with staring and twitching spells.  ER evaluation was thorough, normal lab work including sed rate and CRP.  Brain scan was done without acute findings.  Blood pressure decreased over the time he spent in the ER without medication.  Headache was treated and patient was stable upon discharge.  Patient is here with his son today as a walk-in.  Slept fine.  Over the course the morning had developed another right-sided headache mild by report.  No associated diplopia, dysarthria, paresis.  He took a Tylenol and symptoms have subsided.  Son is very worried about his blood pressure.  Review of systems negative for speech disturbance, more twitching or staring spells, chest pain, shortness of breath, sinus symptoms.  Assessment  1. Right sided temporal headache   2. Essential hypertension      Plan   Headache: Resolved with Tylenol which is reassuring.  Work-up for temporal arteritis was negative yesterday.  No neurologic findings consistent with TIA or stroke.  No history of seizures.  Suspect hypertensive response was in part related to his pain.  Normal blood pressures now.  Normal exam now.  Reassured.  Patient to monitor headache complex over the next several days.  Report to ER for any red flag symptoms discussed in detail.  Needs follow-up with PCP to discuss blood pressure control.  I again reassured patient about normal blood pressure and concur that I would not change medications at this time.  Follow up: Return in about 2 weeks  (around 09/15/2017) for follow up Hypertension with Dr. [name].   No orders of the defined types were placed in this encounter.  No orders of the defined types were placed in this encounter.     I reviewed the patients updated PMH, FH, and SocHx.    Patient Active Problem List   Diagnosis Date Noted  . Physical exam 10/08/2014  . Facial skin lesion 04/08/2014  . COPD (chronic obstructive pulmonary disease) ([place]) 12/04/2013  . Elevated WBCs 11/22/2013  . HTN (hypertension) 10/15/2013  . Osteoporosis 10/15/2013  . History of CVA (cerebrovascular accident) 10/15/2013  . History of compression fracture of spine 10/15/2013  . Recurrent pneumonia 10/15/2013  . Hyperlipidemia 10/15/2013  . Low testosterone 10/15/2013  . Carotid stenosis 10/15/2013   Current Meds  Medication Sig  . acetaminophen (TYLENOL) 500 MG tablet Take 500-1,000 mg by mouth every 8 (eight) hours as needed for mild pain or headache.   . alendronate (FOSAMAX) 70 MG tablet Take 1 tablet (70 mg total) by mouth once a week. TAKE WITH A FULL GLASS OF WATER ON AT NOON TIME   EMPTY STOMACH. (Patient taking differently: Take 70 mg by mouth See admin instructions. Take 70 mg by mouth once a week on Mondays/take with a full glass of water on an empty stomach)  . alendronate (FOSAMAX) 70 MG tablet TAKE 1 TABLET ONCE A WEEK WITH FULL GLASS OF WATER AT NOON TIME ON AN EMPTY STOMACH  . aspirin EC 81 MG tablet Take 81 mg by mouth  daily.  . atorvastatin (LIPITOR) 20 MG tablet TAKE 1 TABLET (20 MG TOTAL) BY MOUTH DAILY.  . bisacodyl (DULCOLAX) 5 MG EC tablet Take 5 mg by mouth daily as needed for mild constipation or moderate constipation.  . Cholecalciferol (VITAMIN D-3) 1000 units CAPS Take 2,000 Units by mouth daily.  . diphenhydrAMINE (BENADRYL) 25 MG tablet Take 25 mg by mouth every 6 (six) hours as needed (for seasonal allergies).  . diphenhydramine-acetaminophen (TYLENOL PM) 25-500 MG TABS tablet Take 1 tablet by mouth at bedtime  as needed (for sleep or pain).  [name] FLOVENT HFA 110 MCG/ACT inhaler USE 2 INHALATIONS TWICE A DAY (Patient taking differently: Inhale 2 puffs into the lungs two times a day as needed for flares)  . ipratropium (ATROVENT HFA) 17 MCG/ACT inhaler Inhale 2 puffs into the lungs every 6 (six) hours as needed for wheezing.  [name] losartan (COZAAR) 100 MG tablet Take 1 tablet (100 mg total) by mouth daily.  . Multiple Vitamins-Minerals (PRESERVISION AREDS 2) CAPS Take 1 capsule by mouth daily.   [name] Glycol-Propyl Glycol (SYSTANE) 0.4-0.3 % SOLN Place 2 drops into both eyes 3 (three) times daily as needed.    Allergies: Patient has No Known Allergies. Family History: Patient family history includes Colon cancer in his brother. Social History:  Patient  reports that he quit smoking about 29 years ago. His smoking use included cigarettes. He has a 40.00 pack-year smoking history. He has never used smokeless tobacco. He reports that he does not drink alcohol or use drugs.  Review of Systems: Constitutional: Negative for fever malaise or anorexia Cardiovascular: negative for chest pain Respiratory: negative for SOB or persistent cough Gastrointestinal: negative for abdominal pain  Objective  Vitals: BP 130/82   Pulse 95   Temp 97.7 F (36.5 C)   Ht 6' (1.829 m)   Wt 144 lb (65.3 kg)   SpO2 92%   BMI 19.53 kg/m  General: no acute distress , A&Ox3, appears well HEENT: PEERL, conjunctiva normal, Oropharynx moist,neck is supple, TMs normal Cardiovascular:  RRR without murmur or gallop.  Respiratory:  Good breath sounds bilaterally, CTAB with normal respiratory effort Skin:  Warm, no rashes Neuro: Gets to exam table with some assistance.  Steady gait.  Brain CT: no acute findings Admission on 08/31/2017, Discharged on 09/01/2017  Component Date Value Ref Range Status  . Glucose-Capillary 08/31/2017 132* 70 - 99 mg/dL Final  . Sodium 08/31/2017 142  135 - 145 mmol/L Final  . Potassium  08/31/2017 3.8  3.5 - 5.1 mmol/L Final  . Chloride 08/31/2017 106  98 - 111 mmol/L Final  . BUN 08/31/2017 11  8 - 23 mg/dL Final  . Creatinine, Ser 08/31/2017 0.60* 0.61 - 1.24 mg/dL Final  . Glucose, Bld 08/31/2017 143* 70 - 99 mg/dL Final  . Calcium, Ion 08/31/2017 1.05* 1.15 - 1.40 mmol/L Final  . TCO2 08/31/2017 22  22 - 32 mmol/L Final  . Hemoglobin 08/31/2017 15.0  13.0 - 17.0 g/dL Final  . HCT 08/31/2017 44.0  39.0 - 52.0 % Final  . Alcohol, Ethyl (B) 08/31/2017 <10  <10 mg/dL Final  . Prothrombin Time 08/31/2017 13.9  11.4 - 15.2 seconds Final  . INR 08/31/2017 1.08   Final  . aPTT 08/31/2017 27  24 - 36 seconds Final  . WBC 08/31/2017 10.9* 4.0 - 10.5 K/uL Final  . RBC 08/31/2017 4.68  4.22 - 5.81 MIL/uL Final  . Hemoglobin 08/31/2017 14.3  13.0 - 17.0 g/dL Final  .  HCT 08/31/2017 45.1  39.0 - 52.0 % Final  . MCV 08/31/2017 96.4  78.0 - 100.0 fL Final  . MCH 08/31/2017 30.6  26.0 - 34.0 pg Final  . MCHC 08/31/2017 31.7  30.0 - 36.0 g/dL Final  . RDW 08/31/2017 13.6  11.5 - 15.5 % Final  . Platelets 08/31/2017 208  150 - 400 K/uL Final  . Neutrophils Relative % 08/31/2017 65  % Final  . Neutro Abs 08/31/2017 7.1  1.7 - 7.7 K/uL Final  . Lymphocytes Relative 08/31/2017 26  % Final  . Lymphs Abs 08/31/2017 2.8  0.7 - 4.0 K/uL Final  . Monocytes Relative 08/31/2017 7  % Final  . Monocytes Absolute 08/31/2017 0.8  0.1 - 1.0 K/uL Final  . Eosinophils Relative 08/31/2017 1  % Final  . Eosinophils Absolute 08/31/2017 0.1  0.0 - 0.7 K/uL Final  . Basophils Relative 08/31/2017 0  % Final  . Basophils Absolute 08/31/2017 0.0  0.0 - 0.1 K/uL Final  . Immature Granulocytes 08/31/2017 1  % Final  . Abs Immature Granulocytes 08/31/2017 0.1  0.0 - 0.1 K/uL Final  . Sodium 08/31/2017 140  135 - 145 mmol/L Final  . Potassium 08/31/2017 4.0  3.5 - 5.1 mmol/L Final  . Chloride 08/31/2017 109  98 - 111 mmol/L Final  . CO2 08/31/2017 24  22 - 32 mmol/L Final  . Glucose, Bld 08/31/2017 150*  70 - 99 mg/dL Final  . BUN 08/31/2017 11  8 - 23 mg/dL Final  . Creatinine, Ser 08/31/2017 0.73  0.61 - 1.24 mg/dL Final  . Calcium 08/31/2017 8.4* 8.9 - 10.3 mg/dL Final  . Total Protein 08/31/2017 5.4* 6.5 - 8.1 g/dL Final  . Albumin 08/31/2017 3.5  3.5 - 5.0 g/dL Final  . AST 08/31/2017 20  15 - 41 U/L Final  . ALT 08/31/2017 24  0 - 44 U/L Final  . Alkaline Phosphatase 08/31/2017 57  38 - 126 U/L Final  . Total Bilirubin 08/31/2017 0.9  0.3 - 1.2 mg/dL Final  . GFR calc non Af Amer 08/31/2017 >60  >60 mL/min Final  . GFR calc Af Amer 08/31/2017 >60  >60 mL/min Final  . Anion gap 08/31/2017 7  5 - 15 Final  . Troponin i, poc 08/31/2017 0.00  0.00 - 0.08 ng/mL Final  . Comment 3 08/31/2017          Final  . Opiates 08/31/2017 NONE DETECTED  NONE DETECTED Final  . Cocaine 08/31/2017 NONE DETECTED  NONE DETECTED Final  . Benzodiazepines 08/31/2017 NONE DETECTED  NONE DETECTED Final  . Amphetamines 08/31/2017 NONE DETECTED  NONE DETECTED Final  . Tetrahydrocannabinol 08/31/2017 NONE DETECTED  NONE DETECTED Final  . Barbiturates 08/31/2017 Result not available. Reagent lot number recalled by manufacturer.* NONE DETECTED Final  . Color, Urine 08/31/2017 YELLOW  YELLOW Final  . APPearance 08/31/2017 HAZY* CLEAR Final  . Specific Gravity, Urine 08/31/2017 1.012  1.005 - 1.030 Final  . pH 08/31/2017 7.0  5.0 - 8.0 Final  . Glucose, UA 08/31/2017 NEGATIVE  NEGATIVE mg/dL Final  . Hgb urine dipstick 08/31/2017 NEGATIVE  NEGATIVE Final  . Bilirubin Urine 08/31/2017 NEGATIVE  NEGATIVE Final  . Ketones, ur 08/31/2017 NEGATIVE  NEGATIVE mg/dL Final  . Protein, ur 08/31/2017 NEGATIVE  NEGATIVE mg/dL Final  . Nitrite 08/31/2017 NEGATIVE  NEGATIVE Final  . Leukocytes, UA 08/31/2017 NEGATIVE  NEGATIVE Final  . CRP 08/31/2017 <0.8  <1.0 mg/dL Final  . Sed Rate 08/31/2017 2  0 -  16 mm/hr Final     Commons side effects, risks, benefits, and alternatives for medications and treatment plan prescribed  today were discussed, and the patient expressed understanding of the given instructions. Patient is instructed to call or message via MyChart if he/she has any questions or concerns regarding our treatment plan. No barriers to understanding were identified. We discussed Red Flag symptoms and signs in detail. Patient expressed understanding regarding what to do in case of urgent or emergency type symptoms.   Medication list was reconciled, printed and provided to the patient in AVS. Patient instructions and summary information was reviewed with the patient as documented in the AVS. This note was prepared with assistance of Dragon voice recognition software. Occasional wrong-word or sound-a-like substitutions may have occurred due to the inherent limitations of voice recognition software

## 2017-09-08 ENCOUNTER — Ambulatory Visit (INDEPENDENT_AMBULATORY_CARE_PROVIDER_SITE_OTHER): Payer: Medicare Other | Admitting: Family Medicine

## 2017-09-08 ENCOUNTER — Other Ambulatory Visit: Payer: Self-pay

## 2017-09-08 ENCOUNTER — Encounter: Payer: Self-pay | Admitting: Family Medicine

## 2017-09-08 VITALS — BP 123/84 | HR 76 | Temp 98.1°F | Resp 17 | Ht 68.0 in | Wt 146.5 lb

## 2017-09-08 DIAGNOSIS — L03113 Cellulitis of right upper limb: Secondary | ICD-10-CM | POA: Diagnosis not present

## 2017-09-08 DIAGNOSIS — I10 Essential (primary) hypertension: Secondary | ICD-10-CM

## 2017-09-08 DIAGNOSIS — I6523 Occlusion and stenosis of bilateral carotid arteries: Secondary | ICD-10-CM | POA: Diagnosis not present

## 2017-09-08 MED ORDER — CEPHALEXIN 500 MG PO CAPS: 500.0000 mg | ORAL_CAPSULE | Freq: Three times a day (TID) | ORAL | 0 refills | Status: AC

## 2017-09-08 NOTE — Assessment & Plan Note (Signed)
Chronic problem.  Well controlled today.  Asymptomatic.  No med changes at this time.  Will follow.

## 2017-09-08 NOTE — Patient Instructions (Signed)
Follow up as needed or as scheduled No BP medicine changes at this time START the Cephalexin 3x/day x7 days for the skin infection Keep area clean and dry Call with any questions or concerns Have a great summer!!

## 2017-09-08 NOTE — Progress Notes (Signed)
   Subjective:    Patient ID: [name], male    DOB: [birth year], 84 y.o.   MRN: [id number]  HPI HTN- chronic problem, pt was in the ER on 7/4 w/ BP of 150/92 and headache.  BP today is normal at 123/84.  On Losartan 100mg  daily.  Pt reports home BPs have been good all week.  No longer having headache.  No CP, SOB above baseline, no edema.  Pt feels his sxs were associated w/ trigeminal neuralgia (he has hx of this).  R arm skin tear- pt developed 5 cm skin tear when IV tape was removed.  Now w/ spreading redness and TTP.  No drainage   Review of Systems For ROS see HPI     Objective:   Physical Exam  Constitutional: He is oriented to person, place, and time. He appears well-developed and well-nourished. No distress.  HENT:  Head: Normocephalic and atraumatic.  Eyes: Pupils are equal, round, and reactive to light. Conjunctivae and EOM are normal.  Neck: Normal range of motion. Neck supple. No thyromegaly present.  Cardiovascular: Normal rate, regular rhythm, normal heart sounds and intact distal pulses.  No murmur heard. Pulmonary/Chest: Effort normal and breath sounds normal. No respiratory distress.  Abdominal: Soft. Bowel sounds are normal. He exhibits no distension.  Musculoskeletal: He exhibits no edema.  Lymphadenopathy:    He has no cervical adenopathy.  Neurological: He is alert and oriented to person, place, and time. No cranial nerve deficit.  Skin: Skin is warm and dry.  5 cm skin tear lateral to R antecubital fossa w/ surrounding erythema and TTP  Psychiatric: He has a normal mood and affect. His behavior is normal.  Vitals reviewed.         Assessment & Plan:  Cellulitis- new.  Pt has large skin tear on arm from removing IV tape 8 days ago.  Now w/ surrounding redness and TTP.  Start Keflex.  Reviewed supportive care and red flags that should prompt return.  Pt expressed understanding and is in agreement w/ plan.

## 2017-10-13 ENCOUNTER — Telehealth: Payer: Self-pay | Admitting: Family Medicine

## 2017-10-13 NOTE — Telephone Encounter (Signed)
Pt advised of PCP recommendations and stated an understanding.

## 2017-10-13 NOTE — Telephone Encounter (Signed)
Called pt to confirm appt Monday 8/19 @ 10:15 for annual physical with cholesterol check. Pt request place orders for labs so he can come earlier than appt for labs only then go get food and return for appt 10:15. Please advise

## 2017-10-13 NOTE — Telephone Encounter (Signed)
Pt does not need to come early for labs and he does not need to fast prior to his appt.  I want him to eat when he gets up and we will do his blood work at the end of his visit which will be close to 11am and breakfast won't matter at that time

## 2017-10-16 ENCOUNTER — Encounter: Payer: Self-pay | Admitting: Family Medicine

## 2017-10-16 ENCOUNTER — Ambulatory Visit (INDEPENDENT_AMBULATORY_CARE_PROVIDER_SITE_OTHER): Payer: Medicare Other | Admitting: Family Medicine

## 2017-10-16 ENCOUNTER — Other Ambulatory Visit: Payer: Self-pay | Admitting: Family Medicine

## 2017-10-16 ENCOUNTER — Other Ambulatory Visit: Payer: Self-pay

## 2017-10-16 VITALS — BP 126/78 | HR 76 | Temp 98.0°F | Resp 16 | Ht 72.0 in | Wt 145.1 lb

## 2017-10-16 DIAGNOSIS — Z23 Encounter for immunization: Secondary | ICD-10-CM

## 2017-10-16 DIAGNOSIS — D72829 Elevated white blood cell count, unspecified: Secondary | ICD-10-CM

## 2017-10-16 DIAGNOSIS — M810 Age-related osteoporosis without current pathological fracture: Secondary | ICD-10-CM | POA: Diagnosis not present

## 2017-10-16 DIAGNOSIS — I6523 Occlusion and stenosis of bilateral carotid arteries: Secondary | ICD-10-CM | POA: Diagnosis not present

## 2017-10-16 DIAGNOSIS — E785 Hyperlipidemia, unspecified: Secondary | ICD-10-CM

## 2017-10-16 DIAGNOSIS — I10 Essential (primary) hypertension: Secondary | ICD-10-CM

## 2017-10-16 DIAGNOSIS — R05 Cough: Secondary | ICD-10-CM

## 2017-10-16 DIAGNOSIS — J449 Chronic obstructive pulmonary disease, unspecified: Secondary | ICD-10-CM

## 2017-10-16 DIAGNOSIS — R062 Wheezing: Secondary | ICD-10-CM | POA: Diagnosis not present

## 2017-10-16 DIAGNOSIS — R059 Cough, unspecified: Secondary | ICD-10-CM

## 2017-10-16 LAB — CBC WITH DIFFERENTIAL/PLATELET
BASOS PCT: 0.4 % (ref 0.0–3.0)
Basophils Absolute: 0 10*3/uL (ref 0.0–0.1)
EOS PCT: 1.4 % (ref 0.0–5.0)
Eosinophils Absolute: 0.2 10*3/uL (ref 0.0–0.7)
HCT: 50.2 % (ref 39.0–52.0)
HEMOGLOBIN: 16.6 g/dL (ref 13.0–17.0)
LYMPHS PCT: 23 % (ref 12.0–46.0)
Lymphs Abs: 2.8 10*3/uL (ref 0.7–4.0)
MCHC: 33 g/dL (ref 30.0–36.0)
MCV: 93.7 fl (ref 78.0–100.0)
Monocytes Absolute: 1 10*3/uL (ref 0.1–1.0)
Monocytes Relative: 8.5 % (ref 3.0–12.0)
Neutro Abs: 8.1 10*3/uL — ABNORMAL HIGH (ref 1.4–7.7)
Neutrophils Relative %: 66.7 % (ref 43.0–77.0)
Platelets: 251 10*3/uL (ref 150.0–400.0)
RBC: 5.35 Mil/uL (ref 4.22–5.81)
RDW: 14.4 % (ref 11.5–15.5)
WBC: 12.2 10*3/uL — AB (ref 4.0–10.5)

## 2017-10-16 LAB — HEPATIC FUNCTION PANEL
ALT: 25 U/L (ref 0–53)
AST: 21 U/L (ref 0–37)
Albumin: 4.2 g/dL (ref 3.5–5.2)
Alkaline Phosphatase: 79 U/L (ref 39–117)
BILIRUBIN TOTAL: 0.9 mg/dL (ref 0.2–1.2)
Bilirubin, Direct: 0.2 mg/dL (ref 0.0–0.3)
Total Protein: 6.1 g/dL (ref 6.0–8.3)

## 2017-10-16 LAB — BASIC METABOLIC PANEL
BUN: 17 mg/dL (ref 6–23)
CALCIUM: 9.9 mg/dL (ref 8.4–10.5)
CO2: 27 mEq/L (ref 19–32)
CREATININE: 0.92 mg/dL (ref 0.40–1.50)
Chloride: 104 mEq/L (ref 96–112)
GFR: 83.32 mL/min (ref >60.00)
Glucose, Bld: 76 mg/dL (ref 70–99)
Potassium: 4.6 mEq/L (ref 3.5–5.1)
Sodium: 141 mEq/L (ref 135–145)

## 2017-10-16 LAB — LIPID PANEL
Cholesterol: 114 mg/dL (ref 0–200)
HDL: 40 mg/dL (ref >39.00)
LDL Cholesterol: 59 mg/dL (ref 0–99)
NONHDL: 74.24
Total CHOL/HDL Ratio: 3
Triglycerides: 76 mg/dL (ref 0.0–149.0)
VLDL: 15.2 mg/dL (ref 0.0–40.0)

## 2017-10-16 LAB — TSH: TSH: 1.58 u[IU]/mL (ref 0.35–4.50)

## 2017-10-16 LAB — VITAMIN D 25 HYDROXY (VIT D DEFICIENCY, FRACTURES): VITD: 49.3 ng/mL (ref 30.00–100.00)

## 2017-10-16 MED ORDER — IPRATROPIUM BROMIDE HFA 17 MCG/ACT IN AERS: 2.0000 | INHALATION_SPRAY | Freq: Four times a day (QID) | RESPIRATORY_TRACT | 3 refills | Status: AC | PRN

## 2017-10-16 MED ORDER — IPRATROPIUM BROMIDE HFA 17 MCG/ACT IN AERS
2.0000 | INHALATION_SPRAY | Freq: Four times a day (QID) | RESPIRATORY_TRACT | 12 refills | Status: DC | PRN
Start: 2017-10-16 — End: 2017-10-16

## 2017-10-16 NOTE — Assessment & Plan Note (Signed)
Chronic problem, well controlled.  Asymptomatic.  Check labs.  No anticipated med changes.  Will follow. 

## 2017-10-16 NOTE — Assessment & Plan Note (Signed)
Chronic problem.  Tolerating statin w/o difficulty.  Check labs.  Adjust meds prn  

## 2017-10-16 NOTE — Progress Notes (Signed)
   Subjective:    Patient ID: Tomi Bamberger, male    DOB: 10-Feb-1932, 84 y.o.   MRN: [id number]  HPI HTN- chronic problem, on Losartan 100mg  daily w/ good control.  No CP, SOB above baseline, HAs, edema.  Hyperlipidemia- chronic problem, on Lipitor 20mg  daily.  No abd pain, N/V, myalgias  Osteoporosis- chronic problem, on daily Vit D and Fosamax.  Due for repeat Vit D level.  Review of Systems For ROS see HPI     Objective:   Physical Exam  Constitutional: He is oriented to person, place, and time. He appears well-developed and well-nourished. No distress.  HENT:  Head: Normocephalic and atraumatic.  Eyes: Pupils are equal, round, and reactive to light. Conjunctivae and EOM are normal.  Neck: Normal range of motion. Neck supple. No thyromegaly present.  Cardiovascular: Normal rate, regular rhythm, normal heart sounds and intact distal pulses.  No murmur heard. Pulmonary/Chest: Effort normal and breath sounds normal. No respiratory distress.  Abdominal: Soft. Bowel sounds are normal. He exhibits no distension.  Musculoskeletal: He exhibits no edema.  Lymphadenopathy:    He has no cervical adenopathy.  Neurological: He is alert and oriented to person, place, and time. No cranial nerve deficit.  Skin: Skin is warm and dry.  Psychiatric: He has a normal mood and affect. His behavior is normal.  Vitals reviewed.         Assessment & Plan:

## 2017-10-16 NOTE — Assessment & Plan Note (Signed)
Chronic problem, on Fosamax and Vit D.  Check Vit D level and replete prn.

## 2017-10-16 NOTE — Patient Instructions (Signed)
Follow up in 6 months to recheck BP and cholesterol Schedule your Medicare Wellness Visit w/ [facility] notify you of your lab results and make any changes if needed Call and schedule with your dermatologist Keep up the good work!  You look great! Call with any questions or concerns Happy Early Birthday!!!

## 2017-10-25 DIAGNOSIS — E041 Nontoxic single thyroid nodule: Secondary | ICD-10-CM | POA: Diagnosis not present

## 2017-10-31 ENCOUNTER — Other Ambulatory Visit: Payer: Medicare Other

## 2017-10-31 ENCOUNTER — Other Ambulatory Visit: Payer: Self-pay

## 2017-10-31 ENCOUNTER — Ambulatory Visit (INDEPENDENT_AMBULATORY_CARE_PROVIDER_SITE_OTHER): Payer: Medicare Other

## 2017-10-31 ENCOUNTER — Ambulatory Visit (INDEPENDENT_AMBULATORY_CARE_PROVIDER_SITE_OTHER): Payer: Medicare Other | Admitting: Physician Assistant

## 2017-10-31 ENCOUNTER — Encounter: Payer: Self-pay | Admitting: Physician Assistant

## 2017-10-31 ENCOUNTER — Other Ambulatory Visit: Payer: Self-pay | Admitting: Otolaryngology

## 2017-10-31 VITALS — BP 120/80 | HR 108 | Temp 98.3°F | Resp 14 | Ht 72.0 in | Wt 145.0 lb

## 2017-10-31 DIAGNOSIS — D72829 Elevated white blood cell count, unspecified: Secondary | ICD-10-CM | POA: Diagnosis not present

## 2017-10-31 DIAGNOSIS — B9689 Other specified bacterial agents as the cause of diseases classified elsewhere: Secondary | ICD-10-CM | POA: Diagnosis not present

## 2017-10-31 DIAGNOSIS — I6523 Occlusion and stenosis of bilateral carotid arteries: Secondary | ICD-10-CM

## 2017-10-31 DIAGNOSIS — J208 Acute bronchitis due to other specified organisms: Secondary | ICD-10-CM

## 2017-10-31 DIAGNOSIS — J441 Chronic obstructive pulmonary disease with (acute) exacerbation: Secondary | ICD-10-CM

## 2017-10-31 DIAGNOSIS — E041 Nontoxic single thyroid nodule: Secondary | ICD-10-CM

## 2017-10-31 DIAGNOSIS — R05 Cough: Secondary | ICD-10-CM | POA: Diagnosis not present

## 2017-10-31 LAB — CBC WITH DIFFERENTIAL/PLATELET
BASOS PCT: 0.3 % (ref 0.0–3.0)
Basophils Absolute: 0 10*3/uL (ref 0.0–0.1)
EOS ABS: 0.2 10*3/uL (ref 0.0–0.7)
Eosinophils Relative: 1.1 % (ref 0.0–5.0)
HCT: 48.2 % (ref 39.0–52.0)
Hemoglobin: 16.4 g/dL (ref 13.0–17.0)
LYMPHS ABS: 2.3 10*3/uL (ref 0.7–4.0)
Lymphocytes Relative: 15.8 % (ref 12.0–46.0)
MCHC: 34.1 g/dL (ref 30.0–36.0)
MCV: 92.5 fl (ref 78.0–100.0)
MONO ABS: 1.6 10*3/uL — AB (ref 0.1–1.0)
Monocytes Relative: 11.1 % (ref 3.0–12.0)
NEUTROS ABS: 10.6 10*3/uL — AB (ref 1.4–7.7)
NEUTROS PCT: 71.7 % (ref 43.0–77.0)
PLATELETS: 232 10*3/uL (ref 150.0–400.0)
RBC: 5.21 Mil/uL (ref 4.22–5.81)
RDW: 13.9 % (ref 11.5–15.5)
WBC: 14.7 10*3/uL — AB (ref 4.0–10.5)

## 2017-10-31 MED ORDER — DOXYCYCLINE HYCLATE 100 MG PO CAPS: 100.0000 mg | ORAL_CAPSULE | Freq: Two times a day (BID) | ORAL | 0 refills | Status: DC

## 2017-10-31 MED ORDER — PREDNISONE 20 MG PO TABS: 40.0000 mg | ORAL_TABLET | Freq: Every day | ORAL | 0 refills | Status: DC

## 2017-10-31 NOTE — Patient Instructions (Signed)
Please go to the [facility] office for x-ray. The address is [place]  The ladies at the front desk will give you better directions.  Keep well-hydrated.  Limit your caffeine intake.   Start the antibiotic and prednisone burst as directed. Mucinex-DM for cough and congestion.   We will alter regimen based on x-ray results. Follow-up in office Thursday or Friday.

## 2017-10-31 NOTE — Progress Notes (Signed)
Patient presents to clinic today c/o 3 days of chest congestion cough productive of yellow sputum.  Associated symptoms include nasal congestion, PND, scratchy throat, mild headache and chills.  Patient denies recent travel or sick contact.  Denies fever, chest pain.  Patient with history of COPD, notes some mild wheezing.  Has not taken his Flovent or Atrovent today.   Past Medical History:  Diagnosis Date  . Arthritis    hands   . BPH (benign prostatic hyperplasia)   . COPD (chronic obstructive pulmonary disease) ([place])   . Coronary artery disease   . CVA (cerebral infarction) 3 years ago   mild stroke  . HLD (hyperlipidemia)   . Hypertension   . Osteoporosis   . Pneumonia     Current Outpatient Medications on File Prior to Visit  Medication Sig Dispense Refill  . acetaminophen (TYLENOL) 500 MG tablet Take 500-1,000 mg by mouth every 8 (eight) hours as needed for mild pain or headache.     . alendronate (FOSAMAX) 70 MG tablet Take 1 tablet (70 mg total) by mouth once a week. TAKE WITH A FULL GLASS OF WATER ON AT NOON TIME   EMPTY STOMACH. (Patient taking differently: Take 70 mg by mouth See admin instructions. Take 70 mg by mouth once a week on Mondays/take with a full glass of water on an empty stomach) 4 tablet 12  . aspirin EC 81 MG tablet Take 81 mg by mouth daily.    [name] atorvastatin (LIPITOR) 20 MG tablet TAKE 1 TABLET (20 MG TOTAL) BY MOUTH DAILY. 90 tablet 1  . bisacodyl (DULCOLAX) 5 MG EC tablet Take 5 mg by mouth daily as needed for mild constipation or moderate constipation.    . Cholecalciferol (VITAMIN D-3) 1000 units CAPS Take 2,000 Units by mouth daily.    . diphenhydramine-acetaminophen (TYLENOL PM) 25-500 MG TABS tablet Take 1 tablet by mouth at bedtime as needed (for sleep or pain).    [name] FLOVENT HFA 110 MCG/ACT inhaler USE 2 INHALATIONS TWICE A DAY (Patient taking differently: Inhale 2 puffs into the lungs two times a day as needed for flares) 36 g 1  . ipratropium  (ATROVENT HFA) 17 MCG/ACT inhaler Inhale 2 puffs into the lungs every 6 (six) hours as needed for wheezing. 3 Inhaler 3  . losartan (COZAAR) 100 MG tablet Take 1 tablet (100 mg total) by mouth daily. 90 tablet 1  . Multiple Vitamins-Minerals (PRESERVISION AREDS 2) CAPS Take 1 capsule by mouth daily.     [name]-Propyl Glycol (SYSTANE) 0.4-0.3 % SOLN Place 2 drops into both eyes 3 (three) times daily as needed.     No current facility-administered medications on file prior to visit.     No Known Allergies  Family History  Problem Relation Age of Onset  . Colon cancer Brother     Social History   Socioeconomic History  . Marital status: Widowed    Spouse name: Not on file  . Number of children: Not on file  . Years of education: Not on file  . Highest education level: Not on file  Occupational History  . Occupation: Retired  Scientific laboratory technician  . Financial resource strain: Not on file  . Food insecurity:    Worry: Not on file    Inability: Not on file  . Transportation needs:    Medical: Not on file    Non-medical: Not on file  Tobacco Use  . Smoking status: Former Smoker    Packs/day:  2.00    Years: 20.00    Pack years: 40.00    Types: Cigarettes    Last attempt to quit: 02/29/1988    Years since quitting: 29.6  . Smokeless tobacco: Never Used  Substance and Sexual Activity  . Alcohol use: No    Alcohol/week: 0.0 standard drinks  . Drug use: No  . Sexual activity: Not Currently  Lifestyle  . Physical activity:    Days per week: Not on file    Minutes per session: Not on file  . Stress: Not on file  Relationships  . Social connections:    Talks on phone: Not on file    Gets together: Not on file    Attends religious service: Not on file    Active member of club or organization: Not on file    Attends meetings of clubs or organizations: Not on file    Relationship status: Not on file  Other Topics Concern  . Not on file  Social History Narrative  . Not  on file   Review of Systems - See HPI.  All other ROS are negative.  BP 120/80   Pulse (!) 108   Temp 98.3 F (36.8 C) (Oral)   Resp 14   Ht 6' (1.829 m)   Wt 145 lb (65.8 kg)   SpO2 95%   BMI 19.67 kg/m   Physical Exam  Constitutional: He appears well-developed and well-nourished.  HENT:  Head: Normocephalic and atraumatic.  Right Ear: External ear normal.  Left Ear: External ear normal.  Nose: Nose normal.  Mouth/Throat: Oropharynx is clear and moist. No oropharyngeal exudate.  Eyes: Conjunctivae are normal.  Neck: Neck supple.  Cardiovascular: Normal rate, regular rhythm, normal heart sounds and intact distal pulses.  Pulmonary/Chest: No stridor. No respiratory distress. He has wheezes. He has no rales. He exhibits no tenderness.  Lymphadenopathy:    He has no cervical adenopathy.  Psychiatric: He has a normal mood and affect.  Vitals reviewed.   Recent Results (from the past 2160 hour(s))  CBG monitoring, ED     Status: Abnormal   Collection Time: 08/31/17  7:47 PM  Result Value Ref Range   Glucose-Capillary 132 (H) 70 - 99 mg/dL  Ethanol     Status: None   Collection Time: 08/31/17  7:52 PM  Result Value Ref Range   Alcohol, Ethyl (B) <10 <10 mg/dL    Comment: (NOTE) Lowest detectable limit for serum alcohol is 10 mg/dL. For medical purposes only. Performed at [facility], [place] [address], [place], [place] [address]   Protime-INR     Status: None   Collection Time: 08/31/17  7:52 PM  Result Value Ref Range   Prothrombin Time 13.9 11.4 - 15.2 seconds   INR 1.08     Comment: Performed at [facility] [address] [place], [place] [address]  APTT     Status: None   Collection Time: 08/31/17  7:52 PM  Result Value Ref Range   aPTT 27 24 - 36 seconds    Comment: Performed at [facility] [address], [place] [address]  CBC     Status: Abnormal   Collection Time: 08/31/17  7:52 PM  Result Value Ref Range   WBC 10.9 (H)  4.0 - 10.5 K/uL   RBC 4.68 4.22 - 5.81 MIL/uL   Hemoglobin 14.3 13.0 - 17.0 g/dL   HCT 45.1 39.0 - 52.0 %   MCV 96.4  78.0 - 100.0 fL   MCH 30.6 26.0 - 34.0 pg   MCHC 31.7 30.0 - 36.0 g/dL   RDW 13.6 11.5 - 15.5 %   Platelets 208 150 - 400 K/uL    Comment: Performed at [facility], [place] [address] [place], [place] [address]  Differential     Status: None   Collection Time: 08/31/17  7:52 PM  Result Value Ref Range   Neutrophils Relative % 65 %   Neutro Abs 7.1 1.7 - 7.7 K/uL   Lymphocytes Relative 26 %   Lymphs Abs 2.8 0.7 - 4.0 K/uL   Monocytes Relative 7 %   Monocytes Absolute 0.8 0.1 - 1.0 K/uL   Eosinophils Relative 1 %   Eosinophils Absolute 0.1 0.0 - 0.7 K/uL   Basophils Relative 0 %   Basophils Absolute 0.0 0.0 - 0.1 K/uL   Immature Granulocytes 1 %   Abs Immature Granulocytes 0.1 0.0 - 0.1 K/uL    Comment: Performed at [facility] [address], [place], [place] [address]  Comprehensive metabolic panel     Status: Abnormal   Collection Time: 08/31/17  7:52 PM  Result Value Ref Range   Sodium 140 135 - 145 mmol/L   Potassium 4.0 3.5 - 5.1 mmol/L   Chloride 109 98 - 111 mmol/L    Comment: Please note change in reference range.   CO2 24 22 - 32 mmol/L   Glucose, Bld 150 (H) 70 - 99 mg/dL    Comment: Please note change in reference range.   BUN 11 8 - 23 mg/dL    Comment: Please note change in reference range.   Creatinine, Ser 0.73 0.61 - 1.24 mg/dL   Calcium 8.4 (L) 8.9 - 10.3 mg/dL   Total Protein 5.4 (L) 6.5 - 8.1 g/dL   Albumin 3.5 3.5 - 5.0 g/dL   AST 20 15 - 41 U/L   ALT 24 0 - 44 U/L    Comment: Please note change in reference range.   Alkaline Phosphatase 57 38 - 126 U/L   Total Bilirubin 0.9 0.3 - 1.2 mg/dL   GFR calc non Af Amer >60 >60 mL/min   GFR calc Af Amer >60 >60 mL/min    Comment: (NOTE) The eGFR has been calculated using the CKD EPI equation. This calculation has not been validated in all clinical situations. eGFR's  persistently <60 mL/min signify possible Chronic Kidney Disease.    Anion gap 7 5 - 15    Comment: Performed at [facility] [address], [place], [place] [address]  I-stat troponin, ED     Status: None   Collection Time: 08/31/17  8:09 PM  Result Value Ref Range   Troponin i, poc 0.00 0.00 - 0.08 ng/mL   Comment 3            Comment: Due to the release kinetics of cTnI, a negative result within the first hours of the onset of symptoms does not rule out myocardial infarction with certainty. If myocardial infarction is still suspected, repeat the test at appropriate intervals.   I-Stat Chem 8, ED     Status: Abnormal   Collection Time: 08/31/17  8:11 PM  Result Value Ref Range   Sodium 142 135 - 145 mmol/L   Potassium 3.8 3.5 - 5.1 mmol/L   Chloride 106 98 - 111 mmol/L   BUN 11 8 - 23 mg/dL   Creatinine, Ser 0.60 (L) 0.61 - 1.24  mg/dL   Glucose, Bld 143 (H) 70 - 99 mg/dL   Calcium, Ion 1.05 (L) 1.15 - 1.40 mmol/L   TCO2 22 22 - 32 mmol/L   Hemoglobin 15.0 13.0 - 17.0 g/dL   HCT 44.0 39.0 - 52.0 %  Urine rapid drug screen (hosp performed)     Status: Abnormal   Collection Time: 08/31/17  9:28 PM  Result Value Ref Range   Opiates NONE DETECTED NONE DETECTED   Cocaine NONE DETECTED NONE DETECTED   Benzodiazepines NONE DETECTED NONE DETECTED   Amphetamines NONE DETECTED NONE DETECTED   Tetrahydrocannabinol NONE DETECTED NONE DETECTED   Barbiturates (A) NONE DETECTED    Result not available. Reagent lot number recalled by manufacturer.    Comment: Performed at [facility], [place] [address], [place], [place] [address]  Urinalysis, Routine w reflex microscopic     Status: Abnormal   Collection Time: 08/31/17  9:28 PM  Result Value Ref Range   Color, Urine YELLOW YELLOW   APPearance HAZY (A) CLEAR   Specific Gravity, Urine 1.012 1.005 - 1.030   pH 7.0 5.0 - 8.0   Glucose, UA NEGATIVE NEGATIVE mg/dL   Hgb urine dipstick NEGATIVE NEGATIVE   Bilirubin Urine  NEGATIVE NEGATIVE   Ketones, ur NEGATIVE NEGATIVE mg/dL   Protein, ur NEGATIVE NEGATIVE mg/dL   Nitrite NEGATIVE NEGATIVE   Leukocytes, UA NEGATIVE NEGATIVE    Comment: Performed at [facility] [address], [place], [place] [address]  C-reactive protein     Status: None   Collection Time: 08/31/17 10:21 PM  Result Value Ref Range   CRP <0.8 <1.0 mg/dL    Comment: Performed at [facility], [place] [address] [place], [place] [address]  Sedimentation rate     Status: None   Collection Time: 08/31/17 10:21 PM  Result Value Ref Range   Sed Rate 2 0 - 16 mm/hr    Comment: Performed at [facility] [address] [place], [place] [address]  Lipid panel     Status: None   Collection Time: 10/16/17 10:56 AM  Result Value Ref Range   Cholesterol 114 0 - 200 mg/dL    Comment: ATP III Classification       Desirable:  < 200 mg/dL               Borderline High:  200 - 239 mg/dL          High:  > = 240 mg/dL   Triglycerides 76.0 0.0 - 149.0 mg/dL    Comment: Normal:  <150 mg/dLBorderline High:  150 - 199 mg/dL   HDL 40.00 >39.00 mg/dL   VLDL 15.2 0.0 - 40.0 mg/dL   LDL Cholesterol 59 0 - 99 mg/dL   Total CHOL/HDL Ratio 3     Comment:                Men          Women1/2 Average Risk     3.4          3.3Average Risk          5.0          4.42X Average Risk          9.6          7.13X Average Risk          15.0          11.0  NonHDL 74.24     Comment: NOTE:  Non-HDL goal should be 30 mg/dL higher than patient's LDL goal (i.e. LDL goal of < 70 mg/dL, would have non-HDL goal of < 100 mg/dL)  Basic metabolic panel     Status: None   Collection Time: 10/16/17 10:56 AM  Result Value Ref Range   Sodium 141 135 - 145 mEq/L   Potassium 4.6 3.5 - 5.1 mEq/L   Chloride 104 96 - 112 mEq/L   CO2 27 19 - 32 mEq/L   Glucose, Bld 76 70 - 99 mg/dL   BUN 17 6 - 23 mg/dL   Creatinine, Ser 0.92 0.40 - 1.50 mg/dL   Calcium 9.9 8.4 - 10.5 mg/dL   GFR 83.32 >60.00 mL/min   TSH     Status: None   Collection Time: 10/16/17 10:56 AM  Result Value Ref Range   TSH 1.58 0.35 - 4.50 uIU/mL  Hepatic function panel     Status: None   Collection Time: 10/16/17 10:56 AM  Result Value Ref Range   Total Bilirubin 0.9 0.2 - 1.2 mg/dL   Bilirubin, Direct 0.2 0.0 - 0.3 mg/dL   Alkaline Phosphatase 79 39 - 117 U/L   AST 21 0 - 37 U/L   ALT 25 0 - 53 U/L   Total Protein 6.1 6.0 - 8.3 g/dL   Albumin 4.2 3.5 - 5.2 g/dL  CBC with Differential/Platelet     Status: Abnormal   Collection Time: 10/16/17 10:56 AM  Result Value Ref Range   WBC 12.2 (H) 4.0 - 10.5 K/uL   RBC 5.35 4.22 - 5.81 Mil/uL   Hemoglobin 16.6 13.0 - 17.0 g/dL   HCT 50.2 39.0 - 52.0 %   MCV 93.7 78.0 - 100.0 fl   MCHC 33.0 30.0 - 36.0 g/dL   RDW 14.4 11.5 - 15.5 %   Platelets 251.0 150.0 - 400.0 K/uL   Neutrophils Relative % 66.7 43.0 - 77.0 %   Lymphocytes Relative 23.0 12.0 - 46.0 %   Monocytes Relative 8.5 3.0 - 12.0 %   Eosinophils Relative 1.4 0.0 - 5.0 %   Basophils Relative 0.4 0.0 - 3.0 %   Neutro Abs 8.1 (H) 1.4 - 7.7 K/uL   Lymphs Abs 2.8 0.7 - 4.0 K/uL   Monocytes Absolute 1.0 0.1 - 1.0 K/uL   Eosinophils Absolute 0.2 0.0 - 0.7 K/uL   Basophils Absolute 0.0 0.0 - 0.1 K/uL  VITAMIN D 25 Hydroxy (Vit-D Deficiency, Fractures)     Status: None   Collection Time: 10/16/17 10:56 AM  Result Value Ref Range   VITD 49.30 30.00 - 100.00 ng/mL    Assessment/Plan: 1. Acute bacterial bronchitis Will obtain chest x-ray today to rule out pneumonia.  Will start doxycycline.  Supportive measures and OTC medications reviewed.  Treatment for COPD exacerbation as noted below. - DG Chest 2 View; Future - doxycycline (VIBRAMYCIN) 100 MG capsule; Take 1 capsule (100 mg total) by mouth 2 (two) times daily.  Dispense: 14 capsule; Refill: 0  2. COPD exacerbation ([place]) Will start 5-day burst of prednisone 40 mg daily.  Chest x-ray today to rule out pneumonia.  Continue chronic treatment as directed. -  predniSONE (DELTASONE) 20 MG tablet; Take 2 tablets (40 mg total) by mouth daily with breakfast.  Dispense: 10 tablet; Refill: 0   Leeanne Rio, PA-C

## 2017-11-01 ENCOUNTER — Other Ambulatory Visit: Payer: Self-pay

## 2017-11-01 DIAGNOSIS — D72829 Elevated white blood cell count, unspecified: Secondary | ICD-10-CM

## 2017-11-02 ENCOUNTER — Other Ambulatory Visit: Payer: Self-pay

## 2017-11-02 ENCOUNTER — Encounter: Payer: Self-pay | Admitting: Physician Assistant

## 2017-11-02 ENCOUNTER — Ambulatory Visit (INDEPENDENT_AMBULATORY_CARE_PROVIDER_SITE_OTHER): Payer: Medicare Other | Admitting: Physician Assistant

## 2017-11-02 VITALS — BP 114/80 | HR 87 | Temp 97.8°F | Resp 18 | Ht 72.0 in | Wt 146.0 lb

## 2017-11-02 DIAGNOSIS — J189 Pneumonia, unspecified organism: Secondary | ICD-10-CM | POA: Diagnosis not present

## 2017-11-02 DIAGNOSIS — J441 Chronic obstructive pulmonary disease with (acute) exacerbation: Secondary | ICD-10-CM

## 2017-11-02 DIAGNOSIS — I6523 Occlusion and stenosis of bilateral carotid arteries: Secondary | ICD-10-CM | POA: Diagnosis not present

## 2017-11-02 MED ORDER — ALBUTEROL SULFATE (2.5 MG/3ML) 0.083% IN NEBU
2.5000 mg | INHALATION_SOLUTION | Freq: Once | RESPIRATORY_TRACT | Status: AC
  Administered 2017-11-02: 2.5 mg via RESPIRATORY_TRACT

## 2017-11-02 MED ORDER — BENZONATATE 100 MG PO CAPS
100.0000 mg | ORAL_CAPSULE | Freq: Two times a day (BID) | ORAL | 0 refills | Status: DC | PRN
Start: 2017-11-02 — End: 2017-11-29

## 2017-11-02 NOTE — Progress Notes (Signed)
Patient presents to clinic today for 2-day follow-up of pneumonitis and COPD exacerbation. Patient was started on Doxycycline 100 mg BID x 7 days and 40 mg Prednisone QD x 5 days. Is taking medications as directed. Notes he is having a better time getting up congestion than before. Notes less wheeze. Still some SOBOE but improved. Is taking medications as directed. Denies new or worsening symptoms.  Past Medical History:  Diagnosis Date  . Arthritis    hands   . BPH (benign prostatic hyperplasia)   . COPD (chronic obstructive pulmonary disease) ([place])   . Coronary artery disease   . CVA (cerebral infarction) 3 years ago   mild stroke  . HLD (hyperlipidemia)   . Hypertension   . Osteoporosis   . Pneumonia     Current Outpatient Medications on File Prior to Visit  Medication Sig Dispense Refill  . acetaminophen (TYLENOL) 500 MG tablet Take 500-1,000 mg by mouth every 8 (eight) hours as needed for mild pain or headache.     . alendronate (FOSAMAX) 70 MG tablet Take 1 tablet (70 mg total) by mouth once a week. TAKE WITH A FULL GLASS OF WATER ON AT NOON TIME   EMPTY STOMACH. (Patient taking differently: Take 70 mg by mouth See admin instructions. Take 70 mg by mouth once a week on Mondays/take with a full glass of water on an empty stomach) 4 tablet 12  . aspirin EC 81 MG tablet Take 81 mg by mouth daily.    [name] atorvastatin (LIPITOR) 20 MG tablet TAKE 1 TABLET (20 MG TOTAL) BY MOUTH DAILY. 90 tablet 1  . bisacodyl (DULCOLAX) 5 MG EC tablet Take 5 mg by mouth daily as needed for mild constipation or moderate constipation.    . Cholecalciferol (VITAMIN D-3) 1000 units CAPS Take 2,000 Units by mouth daily.    . diphenhydramine-acetaminophen (TYLENOL PM) 25-500 MG TABS tablet Take 1 tablet by mouth at bedtime as needed (for sleep or pain).    [name] doxycycline (VIBRAMYCIN) 100 MG capsule Take 1 capsule (100 mg total) by mouth 2 (two) times daily. 14 capsule 0  . FLOVENT HFA 110 MCG/ACT inhaler USE 2  INHALATIONS TWICE A DAY (Patient taking differently: Inhale 2 puffs into the lungs two times a day as needed for flares) 36 g 1  . ipratropium (ATROVENT HFA) 17 MCG/ACT inhaler Inhale 2 puffs into the lungs every 6 (six) hours as needed for wheezing. 3 Inhaler 3  . losartan (COZAAR) 100 MG tablet Take 1 tablet (100 mg total) by mouth daily. 90 tablet 1  . Multiple Vitamins-Minerals (PRESERVISION AREDS 2) CAPS Take 1 capsule by mouth daily.     [name]-Propyl Glycol (SYSTANE) 0.4-0.3 % SOLN Place 2 drops into both eyes 3 (three) times daily as needed.    . predniSONE (DELTASONE) 20 MG tablet Take 2 tablets (40 mg total) by mouth daily with breakfast. 10 tablet 0   No current facility-administered medications on file prior to visit.     No Known Allergies  Family History  Problem Relation Age of Onset  . Colon cancer Brother     Social History   Socioeconomic History  . Marital status: Widowed    Spouse name: Not on file  . Number of children: Not on file  . Years of education: Not on file  . Highest education level: Not on file  Occupational History  . Occupation: Retired  Scientific laboratory technician  . Financial resource strain: Not on file  .  Food insecurity:    Worry: Not on file    Inability: Not on file  . Transportation needs:    Medical: Not on file    Non-medical: Not on file  Tobacco Use  . Smoking status: Former Smoker    Packs/day: 2.00    Years: 20.00    Pack years: 40.00    Types: Cigarettes    Last attempt to quit: 02/29/1988    Years since quitting: 29.6  . Smokeless tobacco: Never Used  Substance and Sexual Activity  . Alcohol use: No    Alcohol/week: 0.0 standard drinks  . Drug use: No  . Sexual activity: Not Currently  Lifestyle  . Physical activity:    Days per week: Not on file    Minutes per session: Not on file  . Stress: Not on file  Relationships  . Social connections:    Talks on phone: Not on file    Gets together: Not on file    Attends  religious service: Not on file    Active member of club or organization: Not on file    Attends meetings of clubs or organizations: Not on file    Relationship status: Not on file  Other Topics Concern  . Not on file  Social History Narrative  . Not on file    Review of Systems - See HPI.  All other ROS are negative.  BP 114/80   Pulse 87   Temp 97.8 F (36.6 C) (Oral)   Resp 18   Ht 6' (1.829 m)   Wt 146 lb (66.2 kg)   SpO2 97%   BMI 19.80 kg/m   Physical Exam  Constitutional: He is oriented to person, place, and time. He appears well-developed and well-nourished.  HENT:  Head: Normocephalic and atraumatic.  Right Ear: External ear normal.  Left Ear: External ear normal.  Nose: Nose normal.  Mouth/Throat: Oropharynx is clear and moist.  Eyes: Conjunctivae are normal.  Neck: Neck supple.  Cardiovascular: Normal rate, regular rhythm, normal heart sounds and intact distal pulses.  Pulmonary/Chest: Effort normal. No stridor. No respiratory distress. He has wheezes. He has no rales. He exhibits no tenderness.  Lymphadenopathy:    He has no cervical adenopathy.  Neurological: He is alert and oriented to person, place, and time.  Psychiatric: He has a normal mood and affect.  Vitals reviewed.  Recent Results (from the past 2160 hour(s))  CBG monitoring, ED     Status: Abnormal   Collection Time: 08/31/17  7:47 PM  Result Value Ref Range   Glucose-Capillary 132 (H) 70 - 99 mg/dL  Ethanol     Status: None   Collection Time: 08/31/17  7:52 PM  Result Value Ref Range   Alcohol, Ethyl (B) <10 <10 mg/dL    Comment: (NOTE) Lowest detectable limit for serum alcohol is 10 mg/dL. For medical purposes only. Performed at [facility], [place] [address], [place], [place] [address]   Protime-INR     Status: None   Collection Time: 08/31/17  7:52 PM  Result Value Ref Range   Prothrombin Time 13.9 11.4 - 15.2 seconds   INR 1.08     Comment: Performed at [facility] [address] [place], [place] [address]  APTT     Status: None   Collection Time: 08/31/17  7:52 PM  Result Value Ref Range   aPTT 27 24 - 36 seconds    Comment: Performed at [facility],  [address], [place], [place] [address]  CBC     Status: Abnormal   Collection Time: 08/31/17  7:52 PM  Result Value Ref Range   WBC 10.9 (H) 4.0 - 10.5 K/uL   RBC 4.68 4.22 - 5.81 MIL/uL   Hemoglobin 14.3 13.0 - 17.0 g/dL   HCT 45.1 39.0 - 52.0 %   MCV 96.4 78.0 - 100.0 fL   MCH 30.6 26.0 - 34.0 pg   MCHC 31.7 30.0 - 36.0 g/dL   RDW 13.6 11.5 - 15.5 %   Platelets 208 150 - 400 K/uL    Comment: Performed at [facility], [place] [address] [place], [place] [address]  Differential     Status: None   Collection Time: 08/31/17  7:52 PM  Result Value Ref Range   Neutrophils Relative % 65 %   Neutro Abs 7.1 1.7 - 7.7 K/uL   Lymphocytes Relative 26 %   Lymphs Abs 2.8 0.7 - 4.0 K/uL   Monocytes Relative 7 %   Monocytes Absolute 0.8 0.1 - 1.0 K/uL   Eosinophils Relative 1 %   Eosinophils Absolute 0.1 0.0 - 0.7 K/uL   Basophils Relative 0 %   Basophils Absolute 0.0 0.0 - 0.1 K/uL   Immature Granulocytes 1 %   Abs Immature Granulocytes 0.1 0.0 - 0.1 K/uL    Comment: Performed at [facility] [address], [place], [place] [address]  Comprehensive metabolic panel     Status: Abnormal   Collection Time: 08/31/17  7:52 PM  Result Value Ref Range   Sodium 140 135 - 145 mmol/L   Potassium 4.0 3.5 - 5.1 mmol/L   Chloride 109 98 - 111 mmol/L    Comment: Please note change in reference range.   CO2 24 22 - 32 mmol/L   Glucose, Bld 150 (H) 70 - 99 mg/dL    Comment: Please note change in reference range.   BUN 11 8 - 23 mg/dL    Comment: Please note change in reference range.   Creatinine, Ser 0.73 0.61 - 1.24 mg/dL   Calcium 8.4 (L) 8.9 - 10.3 mg/dL   Total Protein 5.4 (L) 6.5 - 8.1 g/dL   Albumin 3.5 3.5 - 5.0 g/dL   AST 20 15 - 41 U/L   ALT 24 0 - 44 U/L    Comment:  Please note change in reference range.   Alkaline Phosphatase 57 38 - 126 U/L   Total Bilirubin 0.9 0.3 - 1.2 mg/dL   GFR calc non Af Amer >60 >60 mL/min   GFR calc Af Amer >60 >60 mL/min    Comment: (NOTE) The eGFR has been calculated using the CKD EPI equation. This calculation has not been validated in all clinical situations. eGFR's persistently <60 mL/min signify possible Chronic Kidney Disease.    Anion gap 7 5 - 15    Comment: Performed at [facility] [address], [place], [place] [address]  I-stat troponin, ED     Status: None   Collection Time: 08/31/17  8:09 PM  Result Value Ref Range   Troponin i, poc 0.00 0.00 - 0.08 ng/mL   Comment 3            Comment: Due to the release kinetics of cTnI, a negative result within the first hours of the onset of symptoms does not rule out myocardial infarction with certainty. If myocardial infarction is still suspected, repeat the test at appropriate intervals.  I-Stat Chem 8, ED     Status: Abnormal   Collection Time: 08/31/17  8:11 PM  Result Value Ref Range   Sodium 142 135 - 145 mmol/L   Potassium 3.8 3.5 - 5.1 mmol/L   Chloride 106 98 - 111 mmol/L   BUN 11 8 - 23 mg/dL   Creatinine, Ser 0.60 (L) 0.61 - 1.24 mg/dL   Glucose, Bld 143 (H) 70 - 99 mg/dL   Calcium, Ion 1.05 (L) 1.15 - 1.40 mmol/L   TCO2 22 22 - 32 mmol/L   Hemoglobin 15.0 13.0 - 17.0 g/dL   HCT 44.0 39.0 - 52.0 %  Urine rapid drug screen (hosp performed)     Status: Abnormal   Collection Time: 08/31/17  9:28 PM  Result Value Ref Range   Opiates NONE DETECTED NONE DETECTED   Cocaine NONE DETECTED NONE DETECTED   Benzodiazepines NONE DETECTED NONE DETECTED   Amphetamines NONE DETECTED NONE DETECTED   Tetrahydrocannabinol NONE DETECTED NONE DETECTED   Barbiturates (A) NONE DETECTED    Result not available. Reagent lot number recalled by manufacturer.    Comment: Performed at [facility], [place] [address], [place], [place] [address]    Urinalysis, Routine w reflex microscopic     Status: Abnormal   Collection Time: 08/31/17  9:28 PM  Result Value Ref Range   Color, Urine YELLOW YELLOW   APPearance HAZY (A) CLEAR   Specific Gravity, Urine 1.012 1.005 - 1.030   pH 7.0 5.0 - 8.0   Glucose, UA NEGATIVE NEGATIVE mg/dL   Hgb urine dipstick NEGATIVE NEGATIVE   Bilirubin Urine NEGATIVE NEGATIVE   Ketones, ur NEGATIVE NEGATIVE mg/dL   Protein, ur NEGATIVE NEGATIVE mg/dL   Nitrite NEGATIVE NEGATIVE   Leukocytes, UA NEGATIVE NEGATIVE    Comment: Performed at [facility] [address] [place], [place] [address]  C-reactive protein     Status: None   Collection Time: 08/31/17 10:21 PM  Result Value Ref Range   CRP <0.8 <1.0 mg/dL    Comment: Performed at [facility], [place] [address] [place], [place] [address]  Sedimentation rate     Status: None   Collection Time: 08/31/17 10:21 PM  Result Value Ref Range   Sed Rate 2 0 - 16 mm/hr    Comment: Performed at [facility] [address] [place], [place] [address]  Lipid panel     Status: None   Collection Time: 10/16/17 10:56 AM  Result Value Ref Range   Cholesterol 114 0 - 200 mg/dL    Comment: ATP III Classification       Desirable:  < 200 mg/dL               Borderline High:  200 - 239 mg/dL          High:  > = 240 mg/dL   Triglycerides 76.0 0.0 - 149.0 mg/dL    Comment: Normal:  <150 mg/dLBorderline High:  150 - 199 mg/dL   HDL 40.00 >39.00 mg/dL   VLDL 15.2 0.0 - 40.0 mg/dL   LDL Cholesterol 59 0 - 99 mg/dL   Total CHOL/HDL Ratio 3     Comment:                Men          Women1/2 Average Risk     3.4          3.3Average Risk  5.0          4.42X Average Risk          9.6          7.13X Average Risk          15.0          11.0                       NonHDL 74.24     Comment: NOTE:  Non-HDL goal should be 30 mg/dL higher than patient's LDL goal (i.e. LDL goal of < 70 mg/dL, would have non-HDL goal of < 100 mg/dL)  Basic metabolic panel      Status: None   Collection Time: 10/16/17 10:56 AM  Result Value Ref Range   Sodium 141 135 - 145 mEq/L   Potassium 4.6 3.5 - 5.1 mEq/L   Chloride 104 96 - 112 mEq/L   CO2 27 19 - 32 mEq/L   Glucose, Bld 76 70 - 99 mg/dL   BUN 17 6 - 23 mg/dL   Creatinine, Ser 0.92 0.40 - 1.50 mg/dL   Calcium 9.9 8.4 - 10.5 mg/dL   GFR 83.32 >60.00 mL/min  TSH     Status: None   Collection Time: 10/16/17 10:56 AM  Result Value Ref Range   TSH 1.58 0.35 - 4.50 uIU/mL  Hepatic function panel     Status: None   Collection Time: 10/16/17 10:56 AM  Result Value Ref Range   Total Bilirubin 0.9 0.2 - 1.2 mg/dL   Bilirubin, Direct 0.2 0.0 - 0.3 mg/dL   Alkaline Phosphatase 79 39 - 117 U/L   AST 21 0 - 37 U/L   ALT 25 0 - 53 U/L   Total Protein 6.1 6.0 - 8.3 g/dL   Albumin 4.2 3.5 - 5.2 g/dL  CBC with Differential/Platelet     Status: Abnormal   Collection Time: 10/16/17 10:56 AM  Result Value Ref Range   WBC 12.2 (H) 4.0 - 10.5 K/uL   RBC 5.35 4.22 - 5.81 Mil/uL   Hemoglobin 16.6 13.0 - 17.0 g/dL   HCT 50.2 39.0 - 52.0 %   MCV 93.7 78.0 - 100.0 fl   MCHC 33.0 30.0 - 36.0 g/dL   RDW 14.4 11.5 - 15.5 %   Platelets 251.0 150.0 - 400.0 K/uL   Neutrophils Relative % 66.7 43.0 - 77.0 %   Lymphocytes Relative 23.0 12.0 - 46.0 %   Monocytes Relative 8.5 3.0 - 12.0 %   Eosinophils Relative 1.4 0.0 - 5.0 %   Basophils Relative 0.4 0.0 - 3.0 %   Neutro Abs 8.1 (H) 1.4 - 7.7 K/uL   Lymphs Abs 2.8 0.7 - 4.0 K/uL   Monocytes Absolute 1.0 0.1 - 1.0 K/uL   Eosinophils Absolute 0.2 0.0 - 0.7 K/uL   Basophils Absolute 0.0 0.0 - 0.1 K/uL  VITAMIN D 25 Hydroxy (Vit-D Deficiency, Fractures)     Status: None   Collection Time: 10/16/17 10:56 AM  Result Value Ref Range   VITD 49.30 30.00 - 100.00 ng/mL  CBC with Differential/Platelet     Status: Abnormal   Collection Time: 10/31/17  9:21 AM  Result Value Ref Range   WBC 14.7 (H) 4.0 - 10.5 K/uL   RBC 5.21 4.22 - 5.81 Mil/uL   Hemoglobin 16.4 13.0 - 17.0 g/dL     HCT 48.2 39.0 - 52.0 %   MCV 92.5 78.0 - 100.0 fl   MCHC 34.1  30.0 - 36.0 g/dL   RDW 13.9 11.5 - 15.5 %   Platelets 232.0 150.0 - 400.0 K/uL   Neutrophils Relative % 71.7 43.0 - 77.0 %   Lymphocytes Relative 15.8 12.0 - 46.0 %   Monocytes Relative 11.1 3.0 - 12.0 %   Eosinophils Relative 1.1 0.0 - 5.0 %   Basophils Relative 0.3 0.0 - 3.0 %   Neutro Abs 10.6 (H) 1.4 - 7.7 K/uL   Lymphs Abs 2.3 0.7 - 4.0 K/uL   Monocytes Absolute 1.6 (H) 0.1 - 1.0 K/uL   Eosinophils Absolute 0.2 0.0 - 0.7 K/uL   Basophils Absolute 0.0 0.0 - 0.1 K/uL    Assessment/Plan: 1. Pneumonitis 2. COPD exacerbation (HCC) Albuterol neb given with significant improvement. No residual wheezing. O2 sats are improved today compared to prior visit. Patient seems more energetic. Continue Doxycycline, Prednisone. Start Tessalon. Continue chronic medication regimen.   - albuterol (PROVENTIL) (2.5 MG/3ML) 0.083% nebulizer solution 2.5 mg   Leeanne Rio, [place]

## 2017-11-02 NOTE — Patient Instructions (Signed)
Please keep up with your steroid burst, maintenance inhalers and antibiotic. Your oxygen and breathing are better today. I have sent in a prescription for Tessalon to also help with your cough. Keep up with the Mucinex to thin out this congestion. Keep hydrated and rest.  ER for any worsening symptoms.   I will call you Monday morning to check on you.

## 2017-11-06 ENCOUNTER — Ambulatory Visit
Admission: RE | Admit: 2017-11-06 | Discharge: 2017-11-06 | Disposition: A | Discharge status: Home / Self Care | Payer: Medicare Other | Source: Ambulatory Visit | Attending: Otolaryngology | Admitting: Otolaryngology

## 2017-11-06 ENCOUNTER — Other Ambulatory Visit: Payer: Self-pay | Admitting: Family Medicine

## 2017-11-06 ENCOUNTER — Telehealth: Payer: Self-pay | Admitting: Physician Assistant

## 2017-11-06 DIAGNOSIS — E041 Nontoxic single thyroid nodule: Secondary | ICD-10-CM

## 2017-11-06 NOTE — Telephone Encounter (Signed)
Called patient mobile phone and home phone. Recording states not accepting phone calls at this time. Will check back later today.

## 2017-11-06 NOTE — Telephone Encounter (Signed)
Spoke with patient. He states he is doing better. Only a little cough. He is taking his abx as directed.

## 2017-11-06 NOTE — Telephone Encounter (Signed)
Can we call Mr. Hall this morning to see how he is feeling compared to last week? Thank you.

## 2017-11-06 NOTE — Telephone Encounter (Signed)
-----   Message from Brunetta Jeans, PA-C sent at 11/02/2017 10:42 AM EDT ----- Call Monday morning.

## 2017-11-08 ENCOUNTER — Other Ambulatory Visit: Payer: Self-pay | Admitting: Otolaryngology

## 2017-11-24 ENCOUNTER — Ambulatory Visit (INDEPENDENT_AMBULATORY_CARE_PROVIDER_SITE_OTHER): Payer: Medicare Other | Admitting: Endocrinology

## 2017-11-24 ENCOUNTER — Encounter: Payer: Self-pay | Admitting: Endocrinology

## 2017-11-24 VITALS — BP 112/64 | HR 100 | Ht 72.0 in | Wt 145.2 lb

## 2017-11-24 DIAGNOSIS — M810 Age-related osteoporosis without current pathological fracture: Secondary | ICD-10-CM | POA: Diagnosis not present

## 2017-11-24 DIAGNOSIS — I6523 Occlusion and stenosis of bilateral carotid arteries: Secondary | ICD-10-CM | POA: Diagnosis not present

## 2017-11-24 NOTE — Patient Instructions (Addendum)
Let's recheck the bone density. Please continue the same medications.  Please take calcium 1200 mg per day, and vitamin-D, 1000 units per day.   Please come back for a follow-up appointment in 1 year.

## 2017-11-24 NOTE — Progress Notes (Signed)
Subjective:    Patient ID: [name], male    DOB: 1931-12-28, 84 y.o.   MRN: [id number]  HPI The state of at least three ongoing medical problems is addressed today, with interval history of each noted here: osteoporosis: (fosamax was started in 2015, Prolia in 2018; he has hypogonadism, but rx of this is precluded by BPH; CXR has shown vertebral compression fractures; last DEXA in 2018: worse T-score was -4.7).  heartburn is mild.  This is a stable problem. Vit-D def: he takes uncertain dosage of non-rx cholecalciferol.  Denies leg cramps and foot numbness.  This is a stable problem. BPH: Urinary stream is still slow.  This is a stable problem. Past Medical History:  Diagnosis Date  . Arthritis    hands   . BPH (benign prostatic hyperplasia)   . COPD (chronic obstructive pulmonary disease) ([place])   . Coronary artery disease   . CVA (cerebral infarction) 3 years ago   mild stroke  . HLD (hyperlipidemia)   . Hypertension   . Osteoporosis   . Pneumonia     Past Surgical History:  Procedure Laterality Date  . BACK SURGERY    . LAPAROSCOPIC APPENDECTOMY N/A 11/02/2013   Procedure: APPENDECTOMY LAPAROSCOPIC;  Surgeon: Donnie Mesa, MD;  Location: [facility];  Service: General;  Laterality: N/A;    Social History   Socioeconomic History  . Marital status: Widowed    Spouse name: Not on file  . Number of children: Not on file  . Years of education: Not on file  . Highest education level: Not on file  Occupational History  . Occupation: Retired  Scientific laboratory technician  . Financial resource strain: Not on file  . Food insecurity:    Worry: Not on file    Inability: Not on file  . Transportation needs:    Medical: Not on file    Non-medical: Not on file  Tobacco Use  . Smoking status: Former Smoker    Packs/day: 2.00    Years: 20.00    Pack years: 40.00    Types: Cigarettes    Last attempt to quit: 02/29/1988    Years since quitting: 29.7  . Smokeless tobacco: Never Used    Substance and Sexual Activity  . Alcohol use: No    Alcohol/week: 0.0 standard drinks  . Drug use: No  . Sexual activity: Not Currently  Lifestyle  . Physical activity:    Days per week: Not on file    Minutes per session: Not on file  . Stress: Not on file  Relationships  . Social connections:    Talks on phone: Not on file    Gets together: Not on file    Attends religious service: Not on file    Active member of club or organization: Not on file    Attends meetings of clubs or organizations: Not on file    Relationship status: Not on file  . Intimate partner violence:    Fear of current or ex partner: Not on file    Emotionally abused: Not on file    Physically abused: Not on file    Forced sexual activity: Not on file  Other Topics Concern  . Not on file  Social History Narrative  . Not on file    Current Outpatient Medications on File Prior to Visit  Medication Sig Dispense Refill  . acetaminophen (TYLENOL) 500 MG tablet Take 500-1,000 mg by mouth every 8 (eight) hours as needed for mild pain  or headache.     . alendronate (FOSAMAX) 70 MG tablet Take 1 tablet (70 mg total) by mouth once a week. TAKE WITH A FULL GLASS OF WATER ON AT NOON TIME   EMPTY STOMACH. (Patient taking differently: Take 70 mg by mouth See admin instructions. Take 70 mg by mouth once a week on Mondays/take with a full glass of water on an empty stomach) 4 tablet 12  . aspirin EC 81 MG tablet Take 81 mg by mouth daily.    [name] atorvastatin (LIPITOR) 20 MG tablet TAKE 1 TABLET (20 MG TOTAL) BY MOUTH DAILY. 90 tablet 1  . bisacodyl (DULCOLAX) 5 MG EC tablet Take 5 mg by mouth daily as needed for mild constipation or moderate constipation.    . Cholecalciferol (VITAMIN D-3) 1000 units CAPS Take 2,000 Units by mouth daily.    . diphenhydramine-acetaminophen (TYLENOL PM) 25-500 MG TABS tablet Take 1 tablet by mouth at bedtime as needed (for sleep or pain).    [name] FLOVENT HFA 110 MCG/ACT inhaler USE 2 INHALATIONS  TWICE A DAY (Patient taking differently: Inhale 2 puffs into the lungs two times a day as needed for flares) 36 g 1  . ipratropium (ATROVENT HFA) 17 MCG/ACT inhaler Inhale 2 puffs into the lungs every 6 (six) hours as needed for wheezing. 3 Inhaler 3  . losartan (COZAAR) 100 MG tablet TAKE 1 TABLET (100 MG TOTAL) BY MOUTH DAILY. 90 tablet 0  . Multiple Vitamins-Minerals (PRESERVISION AREDS 2) CAPS Take 1 capsule by mouth daily.     [name]-Propyl Glycol (SYSTANE) 0.4-0.3 % SOLN Place 2 drops into both eyes 3 (three) times daily as needed.    . benzonatate (TESSALON) 100 MG capsule Take 1 capsule (100 mg total) by mouth 2 (two) times daily as needed for cough. (Patient not taking: Reported on 11/24/2017) 20 capsule 0   No current facility-administered medications on file prior to visit.     No Known Allergies  Family History  Problem Relation Age of Onset  . Colon cancer Brother     BP 112/64   Pulse 100   Ht 6' (1.829 m)   Wt 145 lb 3.2 oz (65.9 kg)   SpO2 97%   BMI 19.69 kg/m    Review of Systems Denies falls and LOC    Objective:   Physical Exam VITAL SIGNS:  See vs page.  GENERAL: no distress.   Chest wall: kyphosis is again noted.   Gait: normal and steady.   Lab Results  Component Value Date   TSH 1.58 10/16/2017   Lab Results  Component Value Date   CREATININE 0.92 10/16/2017   BUN 17 10/16/2017   NA 141 10/16/2017   K 4.6 10/16/2017   CL 104 10/16/2017   CO2 27 10/16/2017   Lab Results  Component Value Date   PTH 60 11/27/2015   CALCIUM 9.9 10/16/2017   CAION 1.05 (L) 08/31/2017   Vit-D=49    Assessment & Plan:  Osteoporosis: due for recheck soon BPH: stable: this complicates the rx of hypogonadism, and therefore of osteoporosis Vit-D def: well-replaced  Patient Instructions  Let's recheck the bone density. Please continue the same medications.  Please take calcium 1200 mg per day, and vitamin-D, 1000 units per day.   Please come back  for a follow-up appointment in 1 year.

## 2017-11-29 ENCOUNTER — Other Ambulatory Visit: Payer: Medicare Other

## 2017-11-29 ENCOUNTER — Ambulatory Visit (INDEPENDENT_AMBULATORY_CARE_PROVIDER_SITE_OTHER): Payer: Medicare Other | Admitting: Family Medicine

## 2017-11-29 ENCOUNTER — Other Ambulatory Visit: Payer: Self-pay

## 2017-11-29 ENCOUNTER — Encounter: Payer: Self-pay | Admitting: Family Medicine

## 2017-11-29 VITALS — BP 139/81 | HR 91 | Temp 97.9°F | Resp 17 | Ht 72.0 in | Wt 143.1 lb

## 2017-11-29 DIAGNOSIS — I6523 Occlusion and stenosis of bilateral carotid arteries: Secondary | ICD-10-CM

## 2017-11-29 DIAGNOSIS — J3489 Other specified disorders of nose and nasal sinuses: Secondary | ICD-10-CM

## 2017-11-29 MED ORDER — DOXYCYCLINE HYCLATE 100 MG PO TABS: 100.0000 mg | ORAL_TABLET | Freq: Two times a day (BID) | ORAL | 0 refills | Status: DC

## 2017-11-29 MED ORDER — MUPIROCIN CALCIUM 2 % EX CREA: 1.0000 "application " | TOPICAL_CREAM | Freq: Two times a day (BID) | CUTANEOUS | 0 refills | Status: DC

## 2017-11-29 NOTE — Progress Notes (Signed)
   Subjective:    Patient ID: [name], male    DOB: 1931/06/05, 84 y.o.   MRN: [id number]  HPI    Review of Systems     Objective:   Physical Exam        Assessment & Plan:

## 2017-11-29 NOTE — Progress Notes (Signed)
   Subjective:    Patient ID: [name], male    DOB: 1931-09-07, 84 y.o.   MRN: [id number]  HPI URI- 'i'm not feeling good and I think I have a bad sinus infxn'.  sxs started last week.  Unable to touch R side of nose w/o pain.  + nasal congestion.  No HA.  No cough.  No ear pain.   Review of Systems For ROS see HPI     Objective:   Physical Exam  Constitutional: He appears well-developed and well-nourished. No distress.  HENT:  Head: Normocephalic and atraumatic.  Right Ear: Tympanic membrane normal.  Left Ear: Tympanic membrane normal.  Nose: Mucosal edema, rhinorrhea and sinus tenderness (exquisitely TTP over tip of nose and R nostril) present. No nose lacerations or nasal deformity. Right sinus exhibits no maxillary sinus tenderness and no frontal sinus tenderness. Left sinus exhibits no maxillary sinus tenderness and no frontal sinus tenderness.  Mouth/Throat: Oropharynx is clear and moist.  Visible sore in R nostril on nasal septum  Neck: Normal range of motion. Neck supple.  Pulmonary/Chest: Effort normal and breath sounds normal. No respiratory distress. He has no wheezes.  Lymphadenopathy:    He has no cervical adenopathy.  Vitals reviewed.         Assessment & Plan:  Nasal sore- new.  Pt's nose is exquisitely TTP and I am unable to get a good exam.  No TTP over sinuses.  Nasal sore visible.  Start Doxy and Mupirocin.  Reviewed supportive care and red flags that should prompt return.  Pt expressed understanding and is in agreement w/ plan.

## 2017-11-29 NOTE — Patient Instructions (Signed)
Reschedule your lab appt for 1 month Follow up as needed or as scheduled START the Doxycycline twice daily- take w/ food Once you are able to touch your nose, apply the Mupirocin cream on a qtip twice daily and rub around your R nostril Call with any questions or concerns Hang in there!

## 2017-12-12 ENCOUNTER — Other Ambulatory Visit: Payer: Self-pay | Admitting: Dermatology

## 2017-12-12 DIAGNOSIS — D0439 Carcinoma in situ of skin of other parts of face: Secondary | ICD-10-CM | POA: Diagnosis not present

## 2017-12-12 DIAGNOSIS — L570 Actinic keratosis: Secondary | ICD-10-CM | POA: Diagnosis not present

## 2017-12-12 DIAGNOSIS — D485 Neoplasm of uncertain behavior of skin: Secondary | ICD-10-CM | POA: Diagnosis not present

## 2017-12-25 ENCOUNTER — Ambulatory Visit (INDEPENDENT_AMBULATORY_CARE_PROVIDER_SITE_OTHER)
Admission: RE | Admit: 2017-12-25 | Discharge: 2017-12-25 | Disposition: A | Discharge status: Home / Self Care | Payer: Medicare Other | Source: Ambulatory Visit | Attending: Endocrinology | Admitting: Endocrinology

## 2017-12-25 DIAGNOSIS — M810 Age-related osteoporosis without current pathological fracture: Secondary | ICD-10-CM

## 2017-12-29 ENCOUNTER — Other Ambulatory Visit (INDEPENDENT_AMBULATORY_CARE_PROVIDER_SITE_OTHER): Payer: Medicare Other

## 2017-12-29 ENCOUNTER — Other Ambulatory Visit: Payer: Self-pay | Admitting: General Practice

## 2017-12-29 DIAGNOSIS — D72829 Elevated white blood cell count, unspecified: Secondary | ICD-10-CM

## 2017-12-29 LAB — CBC WITH DIFFERENTIAL/PLATELET
BASOS ABS: 0 10*3/uL (ref 0.0–0.1)
BASOS PCT: 0.2 % (ref 0.0–3.0)
Eosinophils Absolute: 0.3 10*3/uL (ref 0.0–0.7)
Eosinophils Relative: 1.9 % (ref 0.0–5.0)
HCT: 47.2 % (ref 39.0–52.0)
HEMOGLOBIN: 15.8 g/dL (ref 13.0–17.0)
LYMPHS PCT: 20.1 % (ref 12.0–46.0)
Lymphs Abs: 2.8 10*3/uL (ref 0.7–4.0)
MCHC: 33.5 g/dL (ref 30.0–36.0)
MCV: 93.4 fl (ref 78.0–100.0)
MONO ABS: 1.1 10*3/uL — AB (ref 0.1–1.0)
Monocytes Relative: 8 % (ref 3.0–12.0)
Neutro Abs: 9.7 10*3/uL — ABNORMAL HIGH (ref 1.4–7.7)
Neutrophils Relative %: 69.8 % (ref 43.0–77.0)
PLATELETS: 253 10*3/uL (ref 150.0–400.0)
RBC: 5.06 Mil/uL (ref 4.22–5.81)
RDW: 14.1 % (ref 11.5–15.5)
WBC: 13.8 10*3/uL — AB (ref 4.0–10.5)

## 2018-01-02 ENCOUNTER — Telehealth: Payer: Self-pay | Admitting: Nutrition

## 2018-01-02 NOTE — Telephone Encounter (Signed)
-----   Message from Aleatha Borer, LPN sent at 41/02/4641  8:44 AM EST ----- Called pt to review results of DEXA. States he is interested in learning more about [facility]. Please call to further discuss or to schedule an appt. Thanks

## 2018-01-02 NOTE — Telephone Encounter (Signed)
Appointment made to discuss Forteo for Wednesday 01/03/18

## 2018-01-03 ENCOUNTER — Encounter: Payer: Medicare Other | Attending: Endocrinology | Admitting: Nutrition

## 2018-01-03 DIAGNOSIS — M810 Age-related osteoporosis without current pathological fracture: Secondary | ICD-10-CM

## 2018-01-04 NOTE — Progress Notes (Signed)
We discussed how this medication works, and how it is different from his former treatments for osteoporosis.  He was given a brochure with this information.  He has decided to wait on treatment, until he sees how much it will cost him.  Form for prior authorization was filled out and sent to [facility].  He called back asking if his diagnosis of a possible thyroid cancer diagnosis would be a contraindication for this, but Dr. Loanne Drilling said it would not.  He had no final questions.

## 2018-01-04 NOTE — Patient Instructions (Signed)
Call if questions. 

## 2018-01-16 DIAGNOSIS — D0439 Carcinoma in situ of skin of other parts of face: Secondary | ICD-10-CM | POA: Diagnosis not present

## 2018-01-23 ENCOUNTER — Telehealth: Payer: Self-pay | Admitting: Nutrition

## 2018-01-23 NOTE — Telephone Encounter (Signed)
[name] wanted Dr. Loanne Drilling to know that he does not want to take the Forteo injections.  He gave no reason.  Please advise on what you want him to do.

## 2018-01-29 ENCOUNTER — Other Ambulatory Visit: Payer: Medicare Other

## 2018-01-30 NOTE — Telephone Encounter (Signed)
Mr. Patmon called back asking how you want to proceed with his osteoporosis.  See last note.  Please advise

## 2018-01-30 NOTE — Telephone Encounter (Signed)
Ok, Please continue the same medications. I'll see you next time.

## 2018-01-31 ENCOUNTER — Telehealth: Payer: Self-pay

## 2018-01-31 ENCOUNTER — Other Ambulatory Visit (INDEPENDENT_AMBULATORY_CARE_PROVIDER_SITE_OTHER): Payer: Medicare Other

## 2018-01-31 ENCOUNTER — Telehealth: Payer: Self-pay | Admitting: Endocrinology

## 2018-01-31 DIAGNOSIS — D72829 Elevated white blood cell count, unspecified: Secondary | ICD-10-CM

## 2018-01-31 LAB — CBC WITH DIFFERENTIAL/PLATELET
Basophils Absolute: 0 10*3/uL (ref 0.0–0.1)
Basophils Relative: 0.4 % (ref 0.0–3.0)
EOS ABS: 0.3 10*3/uL (ref 0.0–0.7)
EOS PCT: 2.4 % (ref 0.0–5.0)
HCT: 48 % (ref 39.0–52.0)
HEMOGLOBIN: 15.9 g/dL (ref 13.0–17.0)
LYMPHS PCT: 27.3 % (ref 12.0–46.0)
Lymphs Abs: 2.8 10*3/uL (ref 0.7–4.0)
MCHC: 33.2 g/dL (ref 30.0–36.0)
MCV: 93.6 fl (ref 78.0–100.0)
MONO ABS: 0.7 10*3/uL (ref 0.1–1.0)
Monocytes Relative: 7 % (ref 3.0–12.0)
Neutro Abs: 6.6 10*3/uL (ref 1.4–7.7)
Neutrophils Relative %: 62.9 % (ref 43.0–77.0)
Platelets: 248 10*3/uL (ref 150.0–400.0)
RBC: 5.13 Mil/uL (ref 4.22–5.81)
RDW: 14.6 % (ref 11.5–15.5)
WBC: 10.4 10*3/uL (ref 4.0–10.5)

## 2018-01-31 NOTE — Telephone Encounter (Signed)
Patient is calling in to get his prolia shot, I did not see anything about coverage so I wanted to check with you before I set this up.

## 2018-01-31 NOTE — Telephone Encounter (Signed)
Called pt to inform of Dr. Cordelia Pen instructions. Automated voice states call can not be completed at this time, please try your call again later. Will continue efforts to reach pt.

## 2018-01-31 NOTE — Telephone Encounter (Signed)
Patient is returning Ammie's  call.

## 2018-01-31 NOTE — Telephone Encounter (Signed)
Returned pt call. Informed of Dr. Cordelia Pen recommendations. Verbalized acceptance and understanding.

## 2018-01-31 NOTE — Telephone Encounter (Signed)
Please call pt

## 2018-01-31 NOTE — Telephone Encounter (Signed)
This encounter has been closed as duplicate. Addressed in a previously opened encounter.

## 2018-02-01 NOTE — Telephone Encounter (Signed)
Patient has been verified through the portal and I am waiting for summary of benefits to come back

## 2018-02-05 ENCOUNTER — Other Ambulatory Visit: Payer: Self-pay | Admitting: Family Medicine

## 2018-02-05 NOTE — Telephone Encounter (Signed)
LMTCB I am unable to reach this patient due to my number being blocked- patient owes $0 and can be scheduled for nurse visit after 02/09/18

## 2018-02-05 NOTE — Telephone Encounter (Signed)
Called pt ad made him aware of responsibility (none). Scheduled for nurse visit 02/13/18

## 2018-02-06 DIAGNOSIS — D049 Carcinoma in situ of skin, unspecified: Secondary | ICD-10-CM | POA: Diagnosis not present

## 2018-02-13 ENCOUNTER — Ambulatory Visit (INDEPENDENT_AMBULATORY_CARE_PROVIDER_SITE_OTHER): Payer: Medicare Other

## 2018-02-13 DIAGNOSIS — M810 Age-related osteoporosis without current pathological fracture: Secondary | ICD-10-CM | POA: Diagnosis not present

## 2018-02-13 MED ORDER — DENOSUMAB 60 MG/ML ~~LOC~~ SOSY
60.0000 mg | PREFILLED_SYRINGE | Freq: Once | SUBCUTANEOUS | Status: AC
  Administered 2018-02-13: 60 mg via SUBCUTANEOUS

## 2018-02-13 NOTE — Progress Notes (Signed)
Per orders of Dr. Loanne Drilling injection of Prolia 60mg /mL given today in L outer aspect of upper arm by A. Jaymes Revels, LPN . Patient tolerated injection well.

## 2018-03-09 ENCOUNTER — Encounter: Payer: Self-pay | Admitting: Family Medicine

## 2018-03-09 ENCOUNTER — Other Ambulatory Visit: Payer: Self-pay

## 2018-03-09 ENCOUNTER — Ambulatory Visit (INDEPENDENT_AMBULATORY_CARE_PROVIDER_SITE_OTHER): Payer: Medicare Other | Admitting: Family Medicine

## 2018-03-09 VITALS — BP 110/70 | HR 81 | Temp 98.9°F | Resp 17 | Ht 72.0 in | Wt 142.5 lb

## 2018-03-09 DIAGNOSIS — R413 Other amnesia: Secondary | ICD-10-CM | POA: Diagnosis not present

## 2018-03-09 DIAGNOSIS — J441 Chronic obstructive pulmonary disease with (acute) exacerbation: Secondary | ICD-10-CM

## 2018-03-09 MED ORDER — AMOXICILLIN-POT CLAVULANATE 875-125 MG PO TABS: 1.0000 | ORAL_TABLET | Freq: Two times a day (BID) | ORAL | 0 refills | Status: DC

## 2018-03-09 NOTE — Patient Instructions (Addendum)
Follow up as needed or as scheduled START the Amoxicillin twice daily- take w/ food USE the Flovent- 2 puff twice daily- EVERY day USE the Atrovent (Ipratropium) as needed for wheezing Call with any questions or concerns Hang in there!!

## 2018-03-09 NOTE — Progress Notes (Signed)
   Subjective:    Patient ID: [name], male    DOB: [birth year], 85 y.o.   MRN: [id number]  HPI URI- sxs started yesterday w/ 'feeling bad and coughing'.  + SOB.  Denies wheezing.  No fevers.  Cough is productive.  + sick contacts.  No HAs.  + nasal congestion.  Pt has rescue inhalers at home but has not been using them.  Memory issues- pt seems confused today.  Having a hard time answering questions.  Was not sure how to use which inhaler.   Pt has always been very sharp and today is notably different.   Review of Systems For ROS see HPI     Objective:   Physical Exam Vitals signs reviewed.  Constitutional:      General: He is not in acute distress.    Appearance: He is well-developed.  HENT:     Head: Normocephalic and atraumatic.     Right Ear: Tympanic membrane normal.     Left Ear: Tympanic membrane normal.     Nose: No mucosal edema or rhinorrhea.     Right Sinus: No maxillary sinus tenderness or frontal sinus tenderness.     Left Sinus: No maxillary sinus tenderness or frontal sinus tenderness.     Mouth/Throat:     Pharynx: No oropharyngeal exudate or posterior oropharyngeal erythema.  Eyes:     Conjunctiva/sclera: Conjunctivae normal.     Pupils: Pupils are equal, round, and reactive to light.  Neck:     Musculoskeletal: Normal range of motion and neck supple.  Cardiovascular:     Rate and Rhythm: Normal rate and regular rhythm.     Heart sounds: Normal heart sounds.  Pulmonary:     Effort: Pulmonary effort is normal. No respiratory distress.     Breath sounds: Normal breath sounds. No wheezing.     Comments: Faint crackles on L, CTA on R Good air movement No wheezing Lymphadenopathy:     Cervical: No cervical adenopathy.  Skin:    General: Skin is warm and dry.  Neurological:     Comments: Mildly confused           Assessment & Plan:  COPD exacerbation- pt has hx of this.  Often gets recurrent PNA if not treated early.  Reviewed proper uses of  each inhaler.  Start Augmentin.  Reviewed supportive care and red flags that should prompt return.  Pt expressed understanding and is in agreement w/ plan.   Memory issues- CMA, PA, and myself all noticed difference in pt today.  His memory, cognition, and personality were different.  I spoke w/ his son and voiced my concern.  He indicated that pt's brother died unexpectedly this weekend and they had the funeral on Tuesday.  He reports dad has been 'quiet' since then.  Encouraged son to pay close attention as we are concerned.  Discussed that Augmentin will treat lungs and UTI (which can cause changes).  Will follow.

## 2018-03-23 DIAGNOSIS — Z961 Presence of intraocular lens: Secondary | ICD-10-CM | POA: Diagnosis not present

## 2018-03-23 DIAGNOSIS — H353132 Nonexudative age-related macular degeneration, bilateral, intermediate dry stage: Secondary | ICD-10-CM | POA: Diagnosis not present

## 2018-03-23 DIAGNOSIS — D2310 Other benign neoplasm of skin of unspecified eyelid, including canthus: Secondary | ICD-10-CM | POA: Diagnosis not present

## 2018-03-23 DIAGNOSIS — H35033 Hypertensive retinopathy, bilateral: Secondary | ICD-10-CM | POA: Diagnosis not present

## 2018-04-11 NOTE — Progress Notes (Addendum)
Subjective:   [name] is a 85 y.o. male who presents for Medicare Annual/Subsequent preventive examination.  Review of Systems:  No ROS.  Medicare Wellness Visit. Additional risk factors are reflected in the social history.  Cardiac Risk Factors include: advanced age (>40men, >45 women);dyslipidemia;male gender;hypertension;family history of premature cardiovascular disease;sedentary lifestyle   Sleep patterns: Sleeps 8-10 hours. Up to void x 1-2.  Home Safety/Smoke Alarms: Feels safe in home. Smoke alarms in place.  Living environment; residence and Firearm Safety: Lives with son (and family) in 2 story home. Bedroom on first floor.  Seat Belt Safety/Bike Helmet: Wears seat belt.    Male:   BMD: 12/25/2017 [facility]-Colonoscopy 01/07/2012, normal.      PSA-  Lab Results  Component Value Date   PSA 2.48 10/26/2016   PSA 3.01 10/21/2015   PSA 2.46 10/08/2014       Objective:    Vitals: BP 118/60 (BP Location: Left Arm, Patient Position: Sitting, Cuff Size: Normal)   Pulse 96   Temp 97.7 F (36.5 C) (Temporal)   Resp 18   Ht 6' (1.829 m)   Wt 146 lb (66.2 kg)   SpO2 95%   BMI 19.80 kg/m   Body mass index is 19.8 kg/m.  Advanced Directives 04/12/2018 10/26/2016 10/13/2014 06/17/2014 11/01/2013  Does Patient Have a Medical Advance Directive? No No No No No  Would patient like information on creating a medical advance directive? No - Patient declined Yes (MAU/Ambulatory/Procedural Areas - Information given) No - patient declined information Yes - Educational materials given -    Tobacco Social History   Tobacco Use  Smoking Status Former Smoker  . Packs/day: 2.00  . Years: 20.00  . Pack years: 40.00  . Types: Cigarettes  . Last attempt to quit: 02/29/1988  . Years since quitting: 30.1  Smokeless Tobacco Never Used     Counseling given: Not Answered   Past Medical History:  Diagnosis Date  . Arthritis    hands   . BPH (benign prostatic hyperplasia)   . COPD  (chronic obstructive pulmonary disease) ([place])   . Coronary artery disease   . CVA (cerebral infarction) 3 years ago   mild stroke  . HLD (hyperlipidemia)   . Hypertension   . Osteoporosis   . Pneumonia    Past Surgical History:  Procedure Laterality Date  . BACK SURGERY    . LAPAROSCOPIC APPENDECTOMY N/A 11/02/2013   Procedure: APPENDECTOMY LAPAROSCOPIC;  Surgeon: Donnie Mesa, MD;  Location: [facility] OR;  Service: General;  Laterality: N/A;   Family History  Problem Relation Age of Onset  . Colon cancer Brother   . Heart disease Brother   . Cancer Father   . Colon cancer Brother   . Aneurysm Sister    Social History   Socioeconomic History  . Marital status: Widowed    Spouse name: Not on file  . Number of children: Not on file  . Years of education: Not on file  . Highest education level: Not on file  Occupational History  . Occupation: Retired  Scientific laboratory technician  . Financial resource strain: Not on file  . Food insecurity:    Worry: Not on file    Inability: Not on file  . Transportation needs:    Medical: Not on file    Non-medical: Not on file  Tobacco Use  . Smoking status: Former Smoker    Packs/day: 2.00    Years: 20.00    Pack years: 40.00  Types: Cigarettes    Last attempt to quit: 02/29/1988    Years since quitting: 30.1  . Smokeless tobacco: Never Used  Substance and Sexual Activity  . Alcohol use: No    Alcohol/week: 0.0 standard drinks  . Drug use: No  . Sexual activity: Not Currently  Lifestyle  . Physical activity:    Days per week: Not on file    Minutes per session: Not on file  . Stress: Not on file  Relationships  . Social connections:    Talks on phone: Not on file    Gets together: Not on file    Attends religious service: Not on file    Active member of club or organization: Not on file    Attends meetings of clubs or organizations: Not on file    Relationship status: Not on file  Other Topics Concern  . Not on file  Social History  Narrative  . Not on file    Outpatient Encounter Medications as of 04/12/2018  Medication Sig  . acetaminophen (TYLENOL) 500 MG tablet Take 500-1,000 mg by mouth every 8 (eight) hours as needed for mild pain or headache.   . alendronate (FOSAMAX) 70 MG tablet Take 1 tablet (70 mg total) by mouth once a week. TAKE WITH A FULL GLASS OF WATER ON AT NOON TIME   EMPTY STOMACH. (Patient taking differently: Take 70 mg by mouth See admin instructions. Take 70 mg by mouth once a week on Mondays/take with a full glass of water on an empty stomach)  . aspirin EC 81 MG tablet Take 81 mg by mouth daily.  [name] atorvastatin (LIPITOR) 20 MG tablet TAKE 1 TABLET (20 MG TOTAL) BY MOUTH DAILY.  . bisacodyl (DULCOLAX) 5 MG EC tablet Take 5 mg by mouth daily as needed for mild constipation or moderate constipation.  . Cholecalciferol (VITAMIN D-3) 1000 units CAPS Take 2,000 Units by mouth daily.  [name] denosumab (PROLIA) 60 MG/ML SOSY injection Inject 60 mg into the skin every 6 (six) months.  . diphenhydramine-acetaminophen (TYLENOL PM) 25-500 MG TABS tablet Take 1 tablet by mouth at bedtime as needed (for sleep or pain).  [name] FLOVENT HFA 110 MCG/ACT inhaler USE 2 INHALATIONS TWICE A DAY (Patient taking differently: Inhale 2 puffs into the lungs two times a day as needed for flares)  . ipratropium (ATROVENT HFA) 17 MCG/ACT inhaler Inhale 2 puffs into the lungs every 6 (six) hours as needed for wheezing.  [name] losartan (COZAAR) 100 MG tablet TAKE ONE TABLET BY MOUTH DAILY  . Polyethyl Glycol-Propyl Glycol (SYSTANE) 0.4-0.3 % SOLN Place 2 drops into both eyes 3 (three) times daily as needed.  . Multiple Vitamins-Minerals (PRESERVISION AREDS 2) CAPS Take 1 capsule by mouth daily.   . mupirocin cream (BACTROBAN) 2 % Apply 1 application topically 2 (two) times daily. (Patient not taking: Reported on 04/12/2018)  . [DISCONTINUED] amoxicillin-clavulanate (AUGMENTIN) 875-125 MG tablet Take 1 tablet by mouth 2 (two) times daily.   No  facility-administered encounter medications on file as of 04/12/2018.     Activities of Daily Living In your present state of health, do you have any difficulty performing the following activities: 04/12/2018 10/16/2017  Hearing? N Y  Comment - some diminished conversation tones  Vision? N Y  Comment - some double vision  Difficulty concentrating or making decisions? N N  Walking or climbing stairs? N N  Dressing or bathing? N N  Doing errands, shopping? N N  Preparing Food and eating ? N -  Using the Toilet? N -  In the past six months, have you accidently leaked urine? N -  Do you have problems with loss of bowel control? N -  Managing your Medications? N -  Managing your Finances? N -  Housekeeping or managing your Housekeeping? N -  Some recent data might be hidden    Patient Care Team: [name], MD as PCP - General (Family Medicine) Marin Olp Rudell Cobb, MD as Consulting Physician (Oncology) Tanda Rockers, MD as Consulting Physician (Pulmonary Disease) Renato Shin, MD as Consulting Physician (Endocrinology) [facility] (Dermatology)   Assessment:   This is a routine wellness examination for [name].  Exercise Activities and Dietary recommendations Current Exercise Habits: The patient does not participate in regular exercise at present(fishes during the summer), Exercise limited by: None identified   Diet (meal preparation, eat out, water intake, caffeinated beverages, dairy products, fruits and vegetables): Drinks water, OJ and ensure.   Breakfast: eggs, bacon, coffee Lunch: banana sandwich Dinner: protein and veggies  Goals      Patient Stated   . patient (pt-stated)     Maintain current health by staying active.       Other   . Patient Stated     Maintain current health.        Fall Risk Fall Risk  04/12/2018 10/16/2017 04/24/2017 10/26/2016 03/28/2016  Falls in the past year? 0 No No No No  Number falls in past yr: - - - - -  Risk  Factor Category  - - - - -  Risk for fall due to : - - - - -    Depression Screen PHQ 2/9 Scores 04/12/2018 11/29/2017 10/16/2017 06/02/2017  PHQ - 2 Score 0 0 0 0  PHQ- 9 Score - 0 0 0  Exception Documentation - - - -    Cognitive Function MMSE - Mini Mental State Exam 04/12/2018  Orientation to time 5  Orientation to Place 5  Registration 3  Attention/ Calculation 3  Recall 0  Language- name 2 objects 2  Language- repeat 1  Language- follow 3 step command 3  Language- read & follow direction 1  Write a sentence 1  Copy design 1  Total score 25        Immunization History  Administered Date(s) Administered  . Influenza Split 11/28/2012  . Influenza, High Dose Seasonal PF 10/26/2016  . Influenza,inj,Quad PF,6+ Mos 11/27/2013, 11/03/2015, 10/16/2017  . Pneumococcal Conjugate-13 10/23/2013  . Pneumococcal Polysaccharide-23 10/21/2015  . Zoster Recombinat (Shingrix) 05/23/2017, 07/31/2017    Screening Tests Health Maintenance  Topic Date Due  . TETANUS/TDAP  04/24/2018 (Originally 11/05/1950)  . INFLUENZA VACCINE  Completed  . PNA vac Low Risk Adult  Completed      Plan:    Continue doing brain stimulating activities (puzzles, reading, adult coloring books, staying active) to keep memory sharp.   Bring a copy of your living will and/or healthcare power of attorney to your next office visit.  I have personally reviewed and noted the following in the patient's chart:   . Medical and social history . Use of alcohol, tobacco or illicit drugs  . Current medications and supplements . Functional ability and status . Nutritional status . Physical activity . Advanced directives . List of other physicians . Hospitalizations, surgeries, and ER visits in previous 12 months . Vitals . Screenings to include cognitive, depression, and falls . Referrals and appointments  In addition, I have reviewed and discussed with patient  certain preventive protocols, quality metrics, and  best practice recommendations. A written personalized care plan for preventive services as well as general preventive health recommendations were provided to patient.     Gerilyn Nestle, RN  04/12/2018  Reviewed documentation provided by RN and agree w/ above.  Annye Asa, MD

## 2018-04-12 ENCOUNTER — Ambulatory Visit (INDEPENDENT_AMBULATORY_CARE_PROVIDER_SITE_OTHER): Payer: Medicare Other

## 2018-04-12 ENCOUNTER — Encounter: Payer: Self-pay | Admitting: Family Medicine

## 2018-04-12 ENCOUNTER — Other Ambulatory Visit: Payer: Self-pay

## 2018-04-12 ENCOUNTER — Ambulatory Visit (INDEPENDENT_AMBULATORY_CARE_PROVIDER_SITE_OTHER): Payer: Medicare Other | Admitting: Family Medicine

## 2018-04-12 VITALS — BP 118/60 | HR 96 | Temp 97.7°F | Resp 18 | Ht 72.0 in | Wt 146.0 lb

## 2018-04-12 DIAGNOSIS — E785 Hyperlipidemia, unspecified: Secondary | ICD-10-CM | POA: Diagnosis not present

## 2018-04-12 DIAGNOSIS — E041 Nontoxic single thyroid nodule: Secondary | ICD-10-CM | POA: Diagnosis not present

## 2018-04-12 DIAGNOSIS — I10 Essential (primary) hypertension: Secondary | ICD-10-CM | POA: Diagnosis not present

## 2018-04-12 DIAGNOSIS — M810 Age-related osteoporosis without current pathological fracture: Secondary | ICD-10-CM | POA: Diagnosis not present

## 2018-04-12 DIAGNOSIS — Z0000 Encounter for general adult medical examination without abnormal findings: Secondary | ICD-10-CM

## 2018-04-12 LAB — CBC WITH DIFFERENTIAL/PLATELET
Basophils Absolute: 0 10*3/uL (ref 0.0–0.1)
Basophils Relative: 0.2 % (ref 0.0–3.0)
Eosinophils Absolute: 0.3 10*3/uL (ref 0.0–0.7)
Eosinophils Relative: 2.7 % (ref 0.0–5.0)
HCT: 48.4 % (ref 39.0–52.0)
Hemoglobin: 15.7 g/dL (ref 13.0–17.0)
LYMPHS ABS: 2.1 10*3/uL (ref 0.7–4.0)
Lymphocytes Relative: 18.7 % (ref 12.0–46.0)
MCHC: 32.5 g/dL (ref 30.0–36.0)
MCV: 93.4 fl (ref 78.0–100.0)
Monocytes Absolute: 0.9 10*3/uL (ref 0.1–1.0)
Monocytes Relative: 8.4 % (ref 3.0–12.0)
NEUTROS ABS: 7.8 10*3/uL — AB (ref 1.4–7.7)
Neutrophils Relative %: 70 % (ref 43.0–77.0)
Platelets: 248 10*3/uL (ref 150.0–400.0)
RBC: 5.18 Mil/uL (ref 4.22–5.81)
RDW: 14.5 % (ref 11.5–15.5)
WBC: 11.2 10*3/uL — ABNORMAL HIGH (ref 4.0–10.5)

## 2018-04-12 LAB — BASIC METABOLIC PANEL
BUN: 21 mg/dL (ref 6–23)
CHLORIDE: 102 meq/L (ref 96–112)
CO2: 29 mEq/L (ref 19–32)
Calcium: 9.4 mg/dL (ref 8.4–10.5)
Creatinine, Ser: 0.88 mg/dL (ref 0.40–1.50)
GFR: 82.42 mL/min (ref >60.00)
Glucose, Bld: 93 mg/dL (ref 70–99)
POTASSIUM: 4.3 meq/L (ref 3.5–5.1)
Sodium: 140 mEq/L (ref 135–145)

## 2018-04-12 LAB — TSH: TSH: 1.71 u[IU]/mL (ref 0.35–4.50)

## 2018-04-12 LAB — HEPATIC FUNCTION PANEL
ALT: 19 U/L (ref 0–53)
AST: 17 U/L (ref 0–37)
Albumin: 4.2 g/dL (ref 3.5–5.2)
Alkaline Phosphatase: 65 U/L (ref 39–117)
BILIRUBIN DIRECT: 0.2 mg/dL (ref 0.0–0.3)
Total Bilirubin: 0.8 mg/dL (ref 0.2–1.2)
Total Protein: 6 g/dL (ref 6.0–8.3)

## 2018-04-12 LAB — LIPID PANEL
CHOLESTEROL: 108 mg/dL (ref 0–200)
HDL: 43.9 mg/dL (ref >39.00)
LDL Cholesterol: 53 mg/dL (ref 0–99)
NonHDL: 63.62
Total CHOL/HDL Ratio: 2
Triglycerides: 52 mg/dL (ref 0.0–149.0)
VLDL: 10.4 mg/dL (ref 0.0–40.0)

## 2018-04-12 LAB — VITAMIN D 25 HYDROXY (VIT D DEFICIENCY, FRACTURES): VITD: 36.39 ng/mL (ref 30.00–100.00)

## 2018-04-12 NOTE — Assessment & Plan Note (Signed)
Chronic problem, tolerating statin w/o difficulty.  Check labs.  Adjust meds prn  

## 2018-04-12 NOTE — Patient Instructions (Addendum)
Follow up in 6 months to recheck BP and cholesterol We'll notify you of your lab results and make any changes if needed Make sure you are eating regularly! Let me know if you would like a second opinion on your thyroid nodule Call with any questions or concerns Happy Valentine's Day!!!

## 2018-04-12 NOTE — Assessment & Plan Note (Signed)
Pt has been following w/ Dr Erik Obey (ENT).  He reports he had a repeat US in September which showed minimal change but did recommend bx.  Pt reports he has not had a bx done and that 'they're watching to see if it's cancer.  But even if it is, he said he wouldn't do anything about it'.  Discussed the possibility of a 2nd opinion.  Pt to think about this and discuss w/ family.

## 2018-04-12 NOTE — Assessment & Plan Note (Signed)
Chronic problem.  UTD on DEXA.  On Fosamax and Prolia per Dr Loanne Drilling.  Check Vit D and replete prn.

## 2018-04-12 NOTE — Progress Notes (Signed)
   Subjective:    Patient ID: [name], male    DOB: [birth year], 85 y.o.   MRN: [id number]  HPI HTN- chronic problem, on Losartan 100mg  daily w/ good control.  No CP, SOB above baseline, HAs, visual changes, edema.  Hyperlipidemia- chronic problem, on Lipitor 20mg  daily.  No abd pain, N/V.  Osteoporosis- pt is seeing Dr Loanne Drilling and is currently on Fosamax, Vit D, and Prolia.  Recent DEXA shows T score of -4.7 at R femoral neck.  Pt denies bone aches or joint pains.  No issues with jaw.  Thyroid nodule- following w/ [facility]NT [facility])   Review of Systems For ROS see HPI     Objective:   Physical Exam Vitals signs reviewed.  Constitutional:      General: He is not in acute distress.    Appearance: He is well-developed.     Comments: Frail, elderly man  HENT:     Head: Normocephalic and atraumatic.  Eyes:     Conjunctiva/sclera: Conjunctivae normal.     Pupils: Pupils are equal, round, and reactive to light.  Neck:     Musculoskeletal: Normal range of motion and neck supple.     Thyroid: No thyromegaly.  Cardiovascular:     Rate and Rhythm: Normal rate and regular rhythm.     Heart sounds: Normal heart sounds. No murmur.  Pulmonary:     Effort: Pulmonary effort is normal. No respiratory distress.     Breath sounds: Normal breath sounds.  Abdominal:     General: Bowel sounds are normal. There is no distension.     Palpations: Abdomen is soft.  Lymphadenopathy:     Cervical: No cervical adenopathy.  Skin:    General: Skin is warm and dry.  Neurological:     Mental Status: He is alert and oriented to person, place, and time.     Cranial Nerves: No cranial nerve deficit.  Psychiatric:        Behavior: Behavior normal.           Assessment & Plan:

## 2018-04-12 NOTE — Patient Instructions (Addendum)
Continue doing brain stimulating activities (puzzles, reading, adult coloring books, staying active) to keep memory sharp.   Bring a copy of your living will and/or healthcare power of attorney to your next office visit.   Health Maintenance, Male A healthy lifestyle and preventive care is important for your health and wellness. Ask your health care provider about what schedule of regular examinations is right for you. What should I know about weight and diet? Eat a Healthy Diet  Eat plenty of vegetables, fruits, whole grains, low-fat dairy products, and lean protein.  Do not eat a lot of foods high in solid fats, added sugars, or salt.  Maintain a Healthy Weight Regular exercise can help you achieve or maintain a healthy weight. You should:  Do at least 150 minutes of exercise each week. The exercise should increase your heart rate and make you sweat (moderate-intensity exercise).  Do strength-training exercises at least twice a week. Watch Your Levels of Cholesterol and Blood Lipids  Have your blood tested for lipids and cholesterol every 5 years starting at 83 years of age. If you are at high risk for heart disease, you should start having your blood tested when you are 83 years old. You may need to have your cholesterol levels checked more often if: ? Your lipid or cholesterol levels are high. ? You are older than 83 years of age. ? You are at high risk for heart disease. What should I know about cancer screening? Many types of cancers can be detected early and may often be prevented. Lung Cancer  You should be screened every year for lung cancer if: ? You are a current smoker who has smoked for at least 30 years. ? You are a former smoker who has quit within the past 15 years.  Talk to your health care provider about your screening options, when you should start screening, and how often you should be screened. Colorectal Cancer  Routine colorectal cancer screening usually  begins at 83 years of age and should be repeated every 5-10 years until you are 83 years old. You may need to be screened more often if early forms of precancerous polyps or small growths are found. Your health care provider may recommend screening at an earlier age if you have risk factors for colon cancer.  Your health care provider may recommend using home test kits to check for hidden blood in the stool.  A small camera at the end of a tube can be used to examine your colon (sigmoidoscopy or colonoscopy). This checks for the earliest forms of colorectal cancer. Prostate and Testicular Cancer  Depending on your age and overall health, your health care provider may do certain tests to screen for prostate and testicular cancer.  Talk to your health care provider about any symptoms or concerns you have about testicular or prostate cancer. Skin Cancer  Check your skin from head to toe regularly.  Tell your health care provider about any new moles or changes in moles, especially if: ? There is a change in a mole's size, shape, or color. ? You have a mole that is larger than a pencil eraser.  Always use sunscreen. Apply sunscreen liberally and repeat throughout the day.  Protect yourself by wearing long sleeves, pants, a wide-brimmed hat, and sunglasses when outside. What should I know about heart disease, diabetes, and high blood pressure?  If you are 14-43 years of age, have your blood pressure checked every 3-5 years. If you are 40  years of age or older, have your blood pressure checked every year. You should have your blood pressure measured twice-once when you are at a hospital or clinic, and once when you are not at a hospital or clinic. Record the average of the two measurements. To check your blood pressure when you are not at a hospital or clinic, you can use: ? An automated blood pressure machine at a pharmacy. ? A home blood pressure monitor.  Talk to your health care provider  about your target blood pressure.  If you are between 45-79 years old, ask your health care provider if you should take aspirin to prevent heart disease.  Have regular diabetes screenings by checking your fasting blood sugar level. ? If you are at a normal weight and have a low risk for diabetes, have this test once every three years after the age of 45. ? If you are overweight and have a high risk for diabetes, consider being tested at a younger age or more often.  A one-time screening for abdominal aortic aneurysm (AAA) by ultrasound is recommended for men aged 65-75 years who are current or former smokers. What should I know about preventing infection? Hepatitis B If you have a higher risk for hepatitis B, you should be screened for this virus. Talk with your health care provider to find out if you are at risk for hepatitis B infection. Hepatitis C Blood testing is recommended for:  Everyone born from 1945 through 1965.  Anyone with known risk factors for hepatitis C. Sexually Transmitted Diseases (STDs)  You should be screened each year for STDs including gonorrhea and chlamydia if: ? You are sexually active and are younger than 83 years of age. ? You are older than 83 years of age and your health care provider tells you that you are at risk for this type of infection. ? Your sexual activity has changed since you were last screened and you are at an increased risk for chlamydia or gonorrhea. Ask your health care provider if you are at risk.  Talk with your health care provider about whether you are at high risk of being infected with HIV. Your health care provider may recommend a prescription medicine to help prevent HIV infection. What else can I do?  Schedule regular health, dental, and eye exams.  Stay current with your vaccines (immunizations).  Do not use any tobacco products, such as cigarettes, chewing tobacco, and e-cigarettes. If you need help quitting, ask your health  care provider.  Limit alcohol intake to no more than 2 drinks per day. One drink equals 12 ounces of beer, 5 ounces of wine, or 1 ounces of hard liquor.  Do not use street drugs.  Do not share needles.  Ask your health care provider for help if you need support or information about quitting drugs.  Tell your health care provider if you often feel depressed.  Tell your health care provider if you have ever been abused or do not feel safe at home. This information is not intended to replace advice given to you by your health care provider. Make sure you discuss any questions you have with your health care provider. Document Released: 08/13/2007 Document Revised: 10/14/2015 Document Reviewed: 11/18/2014 Elsevier Interactive Patient Education  2019 [employer]  

## 2018-04-12 NOTE — Assessment & Plan Note (Signed)
Chronic problem.  Well controlled.  Asymptomatic.  Check labs.  No anticipated med changes. 

## 2018-05-03 ENCOUNTER — Other Ambulatory Visit: Payer: Self-pay | Admitting: Family Medicine

## 2018-05-23 ENCOUNTER — Encounter: Payer: Self-pay | Admitting: *Deleted

## 2018-05-26 ENCOUNTER — Emergency Department (HOSPITAL_COMMUNITY): Payer: Medicare Other

## 2018-05-26 ENCOUNTER — Other Ambulatory Visit: Payer: Self-pay

## 2018-05-26 ENCOUNTER — Inpatient Hospital Stay (HOSPITAL_COMMUNITY)
Admission: EM | Admit: 2018-05-26 | Discharge: 2018-05-31 | DRG: 193 | Disposition: A | Discharge status: Skilled Nursing Facility | Payer: Medicare Other | Attending: Internal Medicine | Admitting: Internal Medicine

## 2018-05-26 ENCOUNTER — Encounter (HOSPITAL_COMMUNITY): Payer: Self-pay | Admitting: Radiology

## 2018-05-26 DIAGNOSIS — I10 Essential (primary) hypertension: Secondary | ICD-10-CM | POA: Diagnosis not present

## 2018-05-26 DIAGNOSIS — J440 Chronic obstructive pulmonary disease with acute lower respiratory infection: Secondary | ICD-10-CM | POA: Diagnosis not present

## 2018-05-26 DIAGNOSIS — J9601 Acute respiratory failure with hypoxia: Secondary | ICD-10-CM | POA: Diagnosis not present

## 2018-05-26 DIAGNOSIS — R0902 Hypoxemia: Secondary | ICD-10-CM

## 2018-05-26 DIAGNOSIS — Z8249 Family history of ischemic heart disease and other diseases of the circulatory system: Secondary | ICD-10-CM

## 2018-05-26 DIAGNOSIS — S20229A Contusion of unspecified back wall of thorax, initial encounter: Secondary | ICD-10-CM | POA: Diagnosis not present

## 2018-05-26 DIAGNOSIS — J690 Pneumonitis due to inhalation of food and vomit: Secondary | ICD-10-CM | POA: Diagnosis not present

## 2018-05-26 DIAGNOSIS — Z7983 Long term (current) use of bisphosphonates: Secondary | ICD-10-CM

## 2018-05-26 DIAGNOSIS — I2510 Atherosclerotic heart disease of native coronary artery without angina pectoris: Secondary | ICD-10-CM | POA: Diagnosis present

## 2018-05-26 DIAGNOSIS — Z8673 Personal history of transient ischemic attack (TIA), and cerebral infarction without residual deficits: Secondary | ICD-10-CM

## 2018-05-26 DIAGNOSIS — Z87891 Personal history of nicotine dependence: Secondary | ICD-10-CM

## 2018-05-26 DIAGNOSIS — W19XXXA Unspecified fall, initial encounter: Secondary | ICD-10-CM

## 2018-05-26 DIAGNOSIS — M1990 Unspecified osteoarthritis, unspecified site: Secondary | ICD-10-CM | POA: Diagnosis present

## 2018-05-26 DIAGNOSIS — J9811 Atelectasis: Secondary | ICD-10-CM | POA: Diagnosis not present

## 2018-05-26 DIAGNOSIS — G8929 Other chronic pain: Secondary | ICD-10-CM | POA: Diagnosis present

## 2018-05-26 DIAGNOSIS — Y9301 Activity, walking, marching and hiking: Secondary | ICD-10-CM | POA: Diagnosis present

## 2018-05-26 DIAGNOSIS — E785 Hyperlipidemia, unspecified: Secondary | ICD-10-CM | POA: Diagnosis present

## 2018-05-26 DIAGNOSIS — J189 Pneumonia, unspecified organism: Secondary | ICD-10-CM | POA: Diagnosis not present

## 2018-05-26 DIAGNOSIS — W1839XA Other fall on same level, initial encounter: Secondary | ICD-10-CM | POA: Diagnosis present

## 2018-05-26 DIAGNOSIS — R55 Syncope and collapse: Secondary | ICD-10-CM | POA: Diagnosis not present

## 2018-05-26 DIAGNOSIS — Y92129 Unspecified place in nursing home as the place of occurrence of the external cause: Secondary | ICD-10-CM

## 2018-05-26 DIAGNOSIS — J849 Interstitial pulmonary disease, unspecified: Secondary | ICD-10-CM | POA: Diagnosis not present

## 2018-05-26 DIAGNOSIS — I493 Ventricular premature depolarization: Secondary | ICD-10-CM | POA: Diagnosis present

## 2018-05-26 DIAGNOSIS — R918 Other nonspecific abnormal finding of lung field: Secondary | ICD-10-CM | POA: Diagnosis not present

## 2018-05-26 DIAGNOSIS — R338 Other retention of urine: Secondary | ICD-10-CM | POA: Diagnosis present

## 2018-05-26 DIAGNOSIS — M549 Dorsalgia, unspecified: Secondary | ICD-10-CM | POA: Diagnosis present

## 2018-05-26 DIAGNOSIS — Z85828 Personal history of other malignant neoplasm of skin: Secondary | ICD-10-CM

## 2018-05-26 DIAGNOSIS — R339 Retention of urine, unspecified: Secondary | ICD-10-CM | POA: Diagnosis not present

## 2018-05-26 DIAGNOSIS — R42 Dizziness and giddiness: Secondary | ICD-10-CM | POA: Diagnosis not present

## 2018-05-26 DIAGNOSIS — N401 Enlarged prostate with lower urinary tract symptoms: Secondary | ICD-10-CM | POA: Diagnosis present

## 2018-05-26 DIAGNOSIS — R000 Tachycardia, unspecified: Secondary | ICD-10-CM | POA: Diagnosis not present

## 2018-05-26 DIAGNOSIS — M810 Age-related osteoporosis without current pathological fracture: Secondary | ICD-10-CM | POA: Diagnosis present

## 2018-05-26 DIAGNOSIS — Z79899 Other long term (current) drug therapy: Secondary | ICD-10-CM

## 2018-05-26 DIAGNOSIS — Z7982 Long term (current) use of aspirin: Secondary | ICD-10-CM

## 2018-05-26 LAB — CBC WITH DIFFERENTIAL/PLATELET
Abs Immature Granulocytes: 0.28 10*3/uL — ABNORMAL HIGH (ref 0.00–0.07)
Basophils Absolute: 0.1 10*3/uL (ref 0.0–0.1)
Basophils Relative: 0 %
Eosinophils Absolute: 0.1 10*3/uL (ref 0.0–0.5)
Eosinophils Relative: 0 %
HCT: 46.6 % (ref 39.0–52.0)
Hemoglobin: 15.5 g/dL (ref 13.0–17.0)
IMMATURE GRANULOCYTES: 2 %
Lymphocytes Relative: 13 %
Lymphs Abs: 2 10*3/uL (ref 0.7–4.0)
MCH: 30.8 pg (ref 26.0–34.0)
MCHC: 33.3 g/dL (ref 30.0–36.0)
MCV: 92.6 fL (ref 80.0–100.0)
Monocytes Absolute: 0.9 10*3/uL (ref 0.1–1.0)
Monocytes Relative: 6 %
Neutro Abs: 12.3 10*3/uL — ABNORMAL HIGH (ref 1.7–7.7)
Neutrophils Relative %: 79 %
Platelets: 227 10*3/uL (ref 150–400)
RBC: 5.03 MIL/uL (ref 4.22–5.81)
RDW: 14.4 % (ref 11.5–15.5)
WBC: 15.6 10*3/uL — ABNORMAL HIGH (ref 4.0–10.5)
nRBC: 0 % (ref 0.0–0.2)

## 2018-05-26 LAB — COMPREHENSIVE METABOLIC PANEL
ALT: 43 U/L (ref 0–44)
ANION GAP: 9 (ref 5–15)
AST: 30 U/L (ref 15–41)
Albumin: 3.5 g/dL (ref 3.5–5.0)
Alkaline Phosphatase: 69 U/L (ref 38–126)
BUN: 17 mg/dL (ref 8–23)
CO2: 22 mmol/L (ref 22–32)
Calcium: 8.9 mg/dL (ref 8.9–10.3)
Chloride: 104 mmol/L (ref 98–111)
Creatinine, Ser: 0.75 mg/dL (ref 0.61–1.24)
GFR calc Af Amer: >60 mL/min (ref >60)
GFR calc non Af Amer: >60 mL/min (ref >60)
Glucose, Bld: 133 mg/dL — ABNORMAL HIGH (ref 70–99)
Potassium: 3.8 mmol/L (ref 3.5–5.1)
Sodium: 135 mmol/L (ref 135–145)
Total Bilirubin: 1.5 mg/dL — ABNORMAL HIGH (ref 0.3–1.2)
Total Protein: 5.7 g/dL — ABNORMAL LOW (ref 6.5–8.1)

## 2018-05-26 LAB — URINALYSIS, ROUTINE W REFLEX MICROSCOPIC
Bilirubin Urine: NEGATIVE
Glucose, UA: NEGATIVE mg/dL
Hgb urine dipstick: NEGATIVE
Ketones, ur: NEGATIVE mg/dL
Leukocytes,Ua: NEGATIVE
Nitrite: NEGATIVE
Protein, ur: NEGATIVE mg/dL
Specific Gravity, Urine: 1.042 — ABNORMAL HIGH (ref 1.005–1.030)
pH: 5 (ref 5.0–8.0)

## 2018-05-26 LAB — BRAIN NATRIURETIC PEPTIDE: B Natriuretic Peptide: 27.5 pg/mL (ref 0.0–100.0)

## 2018-05-26 LAB — TROPONIN I
Troponin I: <0.03 ng/mL (ref <0.03)
Troponin I: <0.03 ng/mL (ref <0.03)

## 2018-05-26 LAB — LACTIC ACID, PLASMA: Lactic Acid, Venous: 1.4 mmol/L (ref 0.5–1.9)

## 2018-05-26 LAB — D-DIMER, QUANTITATIVE (NOT AT ARMC): D DIMER QUANT: 16.15 ug{FEU}/mL — AB (ref 0.00–0.50)

## 2018-05-26 MED ORDER — DIPHENHYDRAMINE-APAP (SLEEP) 25-500 MG PO TABS: 1.0000 | ORAL_TABLET | Freq: Every evening | ORAL | Status: DC | PRN

## 2018-05-26 MED ORDER — BUDESONIDE 0.25 MG/2ML IN SUSP
0.2500 mg | Freq: Two times a day (BID) | RESPIRATORY_TRACT | Status: DC
  Administered 2018-05-27 – 2018-05-31 (×9): 0.25 mg via RESPIRATORY_TRACT
  Filled 2018-05-26 (×9): qty 2

## 2018-05-26 MED ORDER — ATORVASTATIN CALCIUM 10 MG PO TABS
20.0000 mg | ORAL_TABLET | Freq: Every day | ORAL | Status: DC
  Administered 2018-05-27 – 2018-05-31 (×5): 20 mg via ORAL
  Filled 2018-05-26 (×5): qty 2

## 2018-05-26 MED ORDER — ACETAMINOPHEN 325 MG PO TABS
650.0000 mg | ORAL_TABLET | Freq: Four times a day (QID) | ORAL | Status: DC | PRN
  Administered 2018-05-26: 650 mg via ORAL
  Filled 2018-05-26: qty 2

## 2018-05-26 MED ORDER — ASPIRIN EC 81 MG PO TBEC
81.0000 mg | DELAYED_RELEASE_TABLET | Freq: Every day | ORAL | Status: DC
  Administered 2018-05-27 – 2018-05-31 (×5): 81 mg via ORAL
  Filled 2018-05-26 (×5): qty 1

## 2018-05-26 MED ORDER — FLUTICASONE PROPIONATE HFA 110 MCG/ACT IN AERO: 1.0000 | INHALATION_SPRAY | Freq: Two times a day (BID) | RESPIRATORY_TRACT | Status: DC

## 2018-05-26 MED ORDER — HYDROCODONE-ACETAMINOPHEN 5-325 MG PO TABS
1.0000 | ORAL_TABLET | ORAL | Status: DC | PRN
  Administered 2018-05-27 (×4): 1 via ORAL
  Administered 2018-05-28: 2 via ORAL
  Administered 2018-05-28: 1 via ORAL
  Administered 2018-05-28 – 2018-05-29 (×3): 2 via ORAL
  Administered 2018-05-30 (×2): 1 via ORAL
  Administered 2018-05-30: 2 via ORAL
  Administered 2018-05-30 – 2018-05-31 (×2): 1 via ORAL
  Filled 2018-05-26: qty 2
  Filled 2018-05-26: qty 1
  Filled 2018-05-26: qty 2
  Filled 2018-05-26 (×2): qty 1
  Filled 2018-05-26 (×2): qty 2
  Filled 2018-05-26 (×4): qty 1
  Filled 2018-05-26: qty 2
  Filled 2018-05-26 (×2): qty 1

## 2018-05-26 MED ORDER — DIPHENHYDRAMINE HCL 25 MG PO CAPS: 25.0000 mg | ORAL_CAPSULE | Freq: Every evening | ORAL | Status: DC | PRN

## 2018-05-26 MED ORDER — SODIUM CHLORIDE 0.9% FLUSH
3.0000 mL | Freq: Two times a day (BID) | INTRAVENOUS | Status: DC
  Administered 2018-05-26 – 2018-05-27 (×3): 3 mL via INTRAVENOUS

## 2018-05-26 MED ORDER — ACETAMINOPHEN 325 MG PO TABS
650.0000 mg | ORAL_TABLET | Freq: Once | ORAL | Status: AC
  Administered 2018-05-26: 650 mg via ORAL
  Filled 2018-05-26: qty 2

## 2018-05-26 MED ORDER — SODIUM CHLORIDE 0.9 % IV SOLN
1.0000 g | INTRAVENOUS | Status: DC
  Administered 2018-05-27 – 2018-05-28 (×2): 1 g via INTRAVENOUS
  Filled 2018-05-26 (×4): qty 10

## 2018-05-26 MED ORDER — ALBUTEROL SULFATE (2.5 MG/3ML) 0.083% IN NEBU
2.5000 mg | INHALATION_SOLUTION | Freq: Four times a day (QID) | RESPIRATORY_TRACT | Status: DC
  Administered 2018-05-26 – 2018-05-29 (×11): 2.5 mg via RESPIRATORY_TRACT
  Filled 2018-05-26 (×11): qty 3

## 2018-05-26 MED ORDER — ACETAMINOPHEN 650 MG RE SUPP: 650.0000 mg | Freq: Four times a day (QID) | RECTAL | Status: DC | PRN

## 2018-05-26 MED ORDER — SODIUM CHLORIDE 0.9% FLUSH
3.0000 mL | INTRAVENOUS | Status: DC | PRN
  Administered 2018-05-30: 09:00:00 3 mL via INTRAVENOUS
  Filled 2018-05-26: qty 3

## 2018-05-26 MED ORDER — SODIUM CHLORIDE 0.9 % IV SOLN: 250.0000 mL | INTRAVENOUS | Status: DC | PRN

## 2018-05-26 MED ORDER — LOSARTAN POTASSIUM 50 MG PO TABS
100.0000 mg | ORAL_TABLET | Freq: Every day | ORAL | Status: DC
  Administered 2018-05-27 – 2018-05-31 (×5): 100 mg via ORAL
  Filled 2018-05-26 (×5): qty 2

## 2018-05-26 MED ORDER — SODIUM CHLORIDE 0.9 % IV SOLN
500.0000 mg | Freq: Once | INTRAVENOUS | Status: AC
  Administered 2018-05-26: 500 mg via INTRAVENOUS
  Filled 2018-05-26 (×2): qty 500

## 2018-05-26 MED ORDER — DENOSUMAB 60 MG/ML ~~LOC~~ SOSY: 60.0000 mg | PREFILLED_SYRINGE | SUBCUTANEOUS | Status: DC

## 2018-05-26 MED ORDER — ACETAMINOPHEN 500 MG PO TABS: 500.0000 mg | ORAL_TABLET | Freq: Three times a day (TID) | ORAL | Status: DC | PRN

## 2018-05-26 MED ORDER — SODIUM CHLORIDE 0.9 % IV SOLN
1.0000 g | Freq: Once | INTRAVENOUS | Status: AC
  Administered 2018-05-26: 1 g via INTRAVENOUS
  Filled 2018-05-26: qty 10

## 2018-05-26 MED ORDER — SODIUM CHLORIDE 0.9 % IV SOLN
1.0000 g | INTRAVENOUS | Status: DC
  Filled 2018-05-26: qty 10

## 2018-05-26 MED ORDER — ACETAMINOPHEN 500 MG PO TABS: 500.0000 mg | ORAL_TABLET | Freq: Every evening | ORAL | Status: DC | PRN

## 2018-05-26 MED ORDER — BISACODYL 5 MG PO TBEC: 5.0000 mg | DELAYED_RELEASE_TABLET | Freq: Every day | ORAL | Status: DC | PRN

## 2018-05-26 MED ORDER — ENOXAPARIN SODIUM 40 MG/0.4ML ~~LOC~~ SOLN
40.0000 mg | SUBCUTANEOUS | Status: DC
  Administered 2018-05-26 – 2018-05-30 (×5): 40 mg via SUBCUTANEOUS
  Filled 2018-05-26 (×5): qty 0.4

## 2018-05-26 MED ORDER — IOHEXOL 350 MG/ML SOLN
80.0000 mL | Freq: Once | INTRAVENOUS | Status: AC | PRN
  Administered 2018-05-26: 80 mL via INTRAVENOUS

## 2018-05-26 MED ORDER — SODIUM CHLORIDE 0.9 % IV SOLN
500.0000 mg | INTRAVENOUS | Status: DC
  Administered 2018-05-27 – 2018-05-28 (×2): 500 mg via INTRAVENOUS
  Filled 2018-05-26 (×3): qty 500

## 2018-05-26 NOTE — ED Notes (Signed)
ED Provider at bedside. 

## 2018-05-26 NOTE — H&P (Signed)
[facility]                                                                                    Patient Demographics  [name], is a 85 y.o. male  CSN: [id number]  MRN: [id number]  DOB - 1931/03/19  Admit Date - 05/26/2018  Outpatient Primary MD for the patient is [name], MD   With History of -  Past Medical History:  Diagnosis Date  . Arthritis    hands   . BPH (benign prostatic hyperplasia)   . COPD (chronic obstructive pulmonary disease) ([place])   . Coronary artery disease   . CVA (cerebral infarction) 3 years ago   mild stroke  . HLD (hyperlipidemia)   . Hypertension   . Osteoporosis   . Pneumonia   . 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 SCC (squamous cell carcinoma) in situ 12/12/2017   left cheek inf.  . 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 BACK SURGERY    . LAPAROSCOPIC APPENDECTOMY N/A 11/02/2013   Procedure: APPENDECTOMY LAPAROSCOPIC;  Surgeon: Donnie Mesa, MD;  Location: [facility];  Service: General;  Laterality: N/A;    in for   Chief Complaint  Patient presents with  . Fall  . Dizziness     HPI  [name]  is a 85 y.o. male, with past medical history significant for CAD, CVA, COPD and BPH presenting from assisted living facility for evaluation after a syncopal episode that occurred for 2 seconds today after he finished breakfast.  No preceding chest pain or palpitations.  Patient injured the middle part of his lumbar spine with trauma to the ribs as well.  Patient denies any fever, chills, shortness of breath, cough, nausea, vomiting or diarrhea.  In the emergency room the patient was found to be hypoxemic with 88% oxygen on room air and his chest CT showed bilateral upper lobes changes suggestive of pneumonia.  Patient had leukocytosis as well.  Patient looks clinically well.    Review of Systems     In addition to the HPI above,  No Fever-chills, No Headache, No changes with Vision or hearing, No problems swallowing food or Liquids, No Chest pain, Cough or Shortness of Breath, No Abdominal pain, No Nausea or Vommitting, Bowel movements are regular, No Blood in stool or Urine, No dysuria, No new skin rashes or bruises, No new weakness, tingling, numbness in any extremity, No recent weight gain or loss, No polyuria, polydypsia or polyphagia, No significant Mental Stressors.  A full 10 point Review of Systems was done, except as stated above, all other Review of Systems were negative.   Social History Social History   Tobacco Use  . Smoking status: Former Smoker    Packs/day: 2.00    Years: 20.00    Pack years: 40.00    Types: Cigarettes    Last attempt to quit: 02/29/1988    Years since quitting: 30.2  . Smokeless tobacco: Never Used  Substance Use Topics  .  Alcohol use: No    Alcohol/week: 0.0 standard drinks     Family History Family History  Problem Relation Age of Onset  . Colon cancer Brother   . Heart disease Brother   . Cancer Father   . Colon cancer Brother   . Aneurysm Sister      Prior to Admission medications   Medication Sig Start Date End Date Taking? Authorizing Provider  acetaminophen (TYLENOL) 500 MG tablet Take 500-1,000 mg by mouth every 8 (eight) hours as needed for mild pain or headache.     [provider]  alendronate (FOSAMAX) 70 MG tablet Take 1 tablet (70 mg total) by mouth once a week. TAKE WITH A FULL GLASS OF WATER ON AT NOON TIME   EMPTY STOMACH. Patient taking differently: Take 70 mg by mouth See admin instructions. Take 70 mg by mouth once a week on Mondays/take with a full glass of water on an empty stomach 06/01/17   Renato Shin, MD  aspirin EC 81 MG tablet Take 81 mg by mouth daily.    [provider]  atorvastatin (LIPITOR) 20 MG tablet TAKE ONE TABLET BY MOUTH DAILY 05/03/18   [name], MD   bisacodyl (DULCOLAX) 5 MG EC tablet Take 5 mg by mouth daily as needed for mild constipation or moderate constipation.    [provider]  Cholecalciferol (VITAMIN D-3) 1000 units CAPS Take 2,000 Units by mouth daily.    [provider]  denosumab (PROLIA) 60 MG/ML SOSY injection Inject 60 mg into the skin every 6 (six) months.    [provider]  diphenhydramine-acetaminophen (TYLENOL PM) 25-500 MG TABS tablet Take 1 tablet by mouth at bedtime as needed (for sleep or pain).    [provider]  FLOVENT HFA 110 MCG/ACT inhaler USE 2 INHALATIONS TWICE A DAY Patient taking differently: Inhale 2 puffs into the lungs two times a day as needed for flares 07/06/17   [name], MD  ipratropium (ATROVENT HFA) 17 MCG/ACT inhaler Inhale 2 puffs into the lungs every 6 (six) hours as needed for wheezing. 10/16/17   [name], MD  losartan (COZAAR) 100 MG tablet TAKE ONE TABLET BY MOUTH DAILY 05/03/18   [name], MD  Multiple Vitamins-Minerals (PRESERVISION AREDS 2) CAPS Take 1 capsule by mouth daily.     [provider]  mupirocin cream (BACTROBAN) 2 % Apply 1 application topically 2 (two) times daily. 11/29/17   [name], MD  Polyethyl Glycol-Propyl Glycol (SYSTANE) 0.4-0.3 % SOLN Place 2 drops into both eyes 3 (three) times daily as needed.    [provider]    No Known Allergies  Physical Exam  Vitals  Blood pressure (!) 151/85, pulse 99, temperature 97.6 F (36.4 C), temperature source Oral, resp. rate (!) 21, height 6' (1.829 m), weight 65.8 kg, SpO2 98 %.   1. General elderly male, extremely pleasant, in moderate pain  2. Normal affect and insight, Not Suicidal or Homicidal, Awake Alert, Oriented X 3.  3. No F.N deficits, grossly, patient moving all extremities.  4. Ears and Eyes appear Normal, Conjunctivae clear, PERRLA. Moist Oral Mucosa.  5. Supple Neck, No JVD, No cervical lymphadenopathy  appriciated, No Carotid Bruits.  6. Symmetrical Chest wall movement, decreased inspiratory effort.  7. RRR, No Gallops, Rubs or Murmurs, No Parasternal Heave.  8. Positive Bowel Sounds, Abdomen Soft, Non tender, No organomegaly appriciated,No rebound -guarding or rigidity.  9.  No Cyanosis, Normal Skin Turgor,  No Skin Rash or Bruise.  Mild back tenderness.  10. Good muscle tone,  joints appear normal , no effusions, Normal ROM.   Data Review  CBC Recent Labs  Lab 05/26/18 1015  WBC 15.6*  HGB 15.5  HCT 46.6  PLT 227  MCV 92.6  MCH 30.8  MCHC 33.3  RDW 14.4  LYMPHSABS 2.0  MONOABS 0.9  EOSABS 0.1  BASOSABS 0.1   ------------------------------------------------------------------------------------------------------------------  Chemistries  Recent Labs  Lab 05/26/18 1015  NA 135  K 3.8  CL 104  CO2 22  GLUCOSE 133*  BUN 17  CREATININE 0.75  CALCIUM 8.9  AST 30  ALT 43  ALKPHOS 69  BILITOT 1.5*   ------------------------------------------------------------------------------------------------------------------ estimated creatinine clearance is 64 mL/min (by C-G formula based on SCr of 0.75 mg/dL). ------------------------------------------------------------------------------------------------------------------ No results for input(s): TSH, T4TOTAL, T3FREE, THYROIDAB in the last 72 hours.  Invalid input(s): FREET3   Coagulation profile No results for input(s): INR, PROTIME in the last 168 hours. ------------------------------------------------------------------------------------------------------------------- Recent Labs    05/26/18 1015  DDIMER 16.15*   -------------------------------------------------------------------------------------------------------------------  Cardiac Enzymes Recent Labs  Lab 05/26/18 1015  TROPONINI <0.03    ------------------------------------------------------------------------------------------------------------------ Invalid input(s): POCBNP   ---------------------------------------------------------------------------------------------------------------  Urinalysis    Component Value Date/Time   COLORURINE YELLOW 05/26/2018 1403   APPEARANCEUR CLEAR 05/26/2018 1403   LABSPEC 1.042 (H) 05/26/2018 1403   PHURINE 5.0 05/26/2018 1403   GLUCOSEU NEGATIVE 05/26/2018 1403   HGBUR NEGATIVE 05/26/2018 1403   BILIRUBINUR NEGATIVE 05/26/2018 1403   BILIRUBINUR negative 11/22/2013 0945   KETONESUR NEGATIVE 05/26/2018 1403   PROTEINUR NEGATIVE 05/26/2018 1403   UROBILINOGEN 0.2 11/22/2013 0945   UROBILINOGEN 1.0 11/01/2013 1954   NITRITE NEGATIVE 05/26/2018 1403   LEUKOCYTESUR NEGATIVE 05/26/2018 1403    ----------------------------------------------------------------------------------------------------------------   Imaging results:   Ct Angio Chest Pe W/cm &/or Wo Cm  Result Date: 05/26/2018 CLINICAL DATA:  Chest pain. EXAM: CT ANGIOGRAPHY CHEST WITH CONTRAST TECHNIQUE: Multidetector CT imaging of the chest was performed using the standard protocol during bolus administration of intravenous contrast. Multiplanar CT image reconstructions and MIPs were obtained to evaluate the vascular anatomy. CONTRAST:  13mL OMNIPAQUE IOHEXOL 350 MG/ML SOLN COMPARISON:  None. FINDINGS: Cardiovascular: Satisfactory opacification of the pulmonary arteries to the segmental level. No evidence of pulmonary embolism. Normal heart size. No pericardial effusion. Coronary artery calcifications are noted. Atherosclerosis of thoracic aorta is noted without aneurysm formation. Mediastinum/Nodes: No enlarged mediastinal, hilar, or axillary lymph nodes. Thyroid gland, trachea, and esophagus demonstrate no significant findings. Lungs/Pleura: No pneumothorax or pleural effusion is noted. Mild bilateral posterior basilar  subsegmental atelectasis or edema is noted. Probable edema or pneumonia is noted in the upper lobes. Upper Abdomen: No acute abnormality. Musculoskeletal: No chest wall abnormality. No acute or significant osseous findings. Review of the MIP images confirms the above findings. IMPRESSION: No definite evidence of pulmonary embolus. Mild bilateral posterior basilar subsegmental atelectasis or edema is noted. Probable mild edema or pneumonia is noted in both upper lobes. Coronary artery calcifications are noted. Aortic Atherosclerosis (ICD10-I70.0). Electronically Signed   By: Marijo Conception, M.D.   On: 05/26/2018 12:48   Dg Chest Port 1 View  Result Date: 05/26/2018 CLINICAL DATA:  Shortness of breath.  Mental status changes. EXAM: PORTABLE CHEST 1 VIEW COMPARISON:  10/31/2017 and 03/23/2016 FINDINGS: Frontal view of the chest. Reverse apical lordotic positioning. Chin overlies the apices. Normal heart size. Atherosclerosis in the transverse aorta. No pleural effusion or pneumothorax. Low lung volumes. Peripheral and basilar predominant  interstitial thickening may be slightly progressive, but is accentuated by technique. No lobar consolidation. IMPRESSION: Interstitial lung disease, accentuated by AP portable technique and low lung volumes. This may be slightly progressive over prior exams. No convincing evidence of acute superimposed process. Aortic Atherosclerosis (ICD10-I70.0). Electronically Signed   By: Abigail Miyamoto M.D.   On: 05/26/2018 10:47      Assessment & Plan  Syncopal episode, history of CAD Place in telemetry/serial troponin Neurochecks Echocardiogram  Back trauma Continue with pain medications PRN  Bilateral pneumonia by CT with leukocytosis/mild hypoxemia Start Rocephin and Zithromax and reevaluate in a.m.  History of hypertension Off medications, slightly uncontrolled probably due to pain  Hyperlipidemia Continue with Lipitor  DVT Prophylaxis Lovenox  AM Labs Ordered,  also please review Full Orders  Family Communication: Admission, patients condition and plan of care including tests being ordered have been discussed with the patient and son on the phone who indicate understanding and agree with the plan and Code Status.  Code Status full  Disposition Plan: Back to assisted living facility  Time spent in minutes : 41 minutes  Condition GUARDED   @SIGNATURE @

## 2018-05-26 NOTE — ED Notes (Signed)
This RN spoke with main lab. They will run the BNP as an add on to previous labs sent down.

## 2018-05-26 NOTE — Progress Notes (Signed)
Alert and oriented male admitted to room 16 from fall at home.  Multiple  skin tears to right forearm and upper arm and  Posterior right arm, covered with pink foam. IV present to right AC.   With fluids infusing.  Nickel size superficial abrasion to right occipital head.   Scattered old bruising to left arm.   No other noted interruption in skin integrity.  Dinner ordered for patient as he states he has not eaten all day.   Call placed to daughter of patient to let her know that patient was on unit and settling in.

## 2018-05-26 NOTE — ED Notes (Signed)
Patient transported to CT 

## 2018-05-26 NOTE — ED Notes (Signed)
Sarah (Daughter-in-[phone]) called for update/would like a call back. Hanan([phone].

## 2018-05-26 NOTE — ED Notes (Signed)
Pt is 83% on room air upon arrival. Pt presently 97% on 2L via [place].

## 2018-05-26 NOTE — ED Notes (Signed)
Attempted report x 2 

## 2018-05-26 NOTE — ED Triage Notes (Signed)
Pt BIB [facility] for a ground level fall today. Pt reports he had some dizziness this morning after returning from getting breakfast. Pt then had a ground level fall. Pt denies any loss of consciousness. Pt reports that he did not hit his head but there is an abrasion noted on the right rear of his head. Pt is alert and oriented x4 and denying any dizziness at present. Pt also has some skin tears on his right forearm per EMS. Pt is complaining of lower back pain that worsens when he takes a deep breath. Per EMS pt was 88% on room air and then 96% on 2L for EMS. Pt does not normally wear any oxygen at home. Pt does have a pmh of COPD.

## 2018-05-26 NOTE — ED Provider Notes (Signed)
[facility] EMERGENCY DEPARTMENT Provider Note   CSN: [id number] Arrival date & time: 05/26/18  8466    History   Chief Complaint Chief Complaint  Patient presents with   Fall   Dizziness    HPI [name] is a 85 y.o. male.     HPI   He presents for evaluation of injury from fall.  He was walking at his nursing care facility, when he felt dizzy and fell.  He injured the middle part of his back when he fell.  There is no reported loss of consciousness.  During transport EMS noticed that his oxygen level was 88% on room air, and improved to 96% with 2 L by nasal cannula.  Patient does not usually wear oxygen.  An injury to the posterior scalp was noted, patient does not complain of headache.  He denies neck pain.  He does not take anticoagulants.  He has no history of congestive heart failure.  He denies anterior chest pain, abdominal pain, leg pain or swelling.  He reports he has been eating well.  There are no other known modifying factors.  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, left cheek    Patient Active Problem List   Diagnosis Date Noted   Thyroid nodule 04/12/2018   Physical exam 10/08/2014   Facial skin lesion 04/08/2014   Elevated WBCs 11/22/2013   HTN (hypertension) 10/15/2013   Osteoporosis 10/15/2013   History of CVA (cerebrovascular accident) 10/15/2013   History of compression fracture of spine 10/15/2013   Recurrent pneumonia 10/15/2013    Hyperlipidemia 10/15/2013   Low testosterone 10/15/2013   Carotid stenosis 10/15/2013    Past Surgical History:  Procedure Laterality Date   BACK SURGERY     LAPAROSCOPIC APPENDECTOMY N/A 11/02/2013   Procedure: APPENDECTOMY LAPAROSCOPIC;  Surgeon: Donnie Mesa, MD;  Location: [place];  Service: General;  Laterality: N/A;        Home Medications    Prior to Admission medications   Medication Sig Start Date End Date Taking? Authorizing Provider  acetaminophen (TYLENOL) 500 MG tablet Take 500-1,000 mg by mouth every 8 (eight) hours as needed for mild pain or headache.     [provider]  alendronate (FOSAMAX) 70 MG tablet Take 1 tablet (70 mg total) by mouth once a week. TAKE WITH A FULL GLASS OF WATER ON AT NOON TIME   EMPTY STOMACH. Patient taking differently: Take 70 mg by mouth See admin instructions. Take 70 mg by mouth once a week on Mondays/take with a full glass of water on an empty stomach 06/01/17   Renato Shin, MD  aspirin EC 81 MG tablet Take 81 mg by mouth daily.    [provider]  atorvastatin (LIPITOR) 20 MG tablet TAKE ONE TABLET BY MOUTH DAILY 05/03/18   [name], MD  bisacodyl (DULCOLAX) 5 MG EC tablet Take 5 mg by  mouth daily as needed for mild constipation or moderate constipation.    [provider]  Cholecalciferol (VITAMIN D-3) 1000 units CAPS Take 2,000 Units by mouth daily.    [provider]  denosumab (PROLIA) 60 MG/ML SOSY injection Inject 60 mg into the skin every 6 (six) months.    [provider]  diphenhydramine-acetaminophen (TYLENOL PM) 25-500 MG TABS tablet Take 1 tablet by mouth at bedtime as needed (for sleep or pain).    [provider]  FLOVENT HFA 110 MCG/ACT inhaler USE 2 INHALATIONS TWICE A DAY Patient taking differently: Inhale 2 puffs into the lungs two times a day as needed for flares 07/06/17   [name], MD  ipratropium (ATROVENT HFA) 17 MCG/ACT inhaler Inhale 2 puffs  into the lungs every 6 (six) hours as needed for wheezing. 10/16/17   [name], MD  losartan (COZAAR) 100 MG tablet TAKE ONE TABLET BY MOUTH DAILY 05/03/18   [name], MD  Multiple Vitamins-Minerals (PRESERVISION AREDS 2) CAPS Take 1 capsule by mouth daily.     [provider]  mupirocin cream (BACTROBAN) 2 % Apply 1 application topically 2 (two) times daily. 11/29/17   [name], MD  Polyethyl Glycol-Propyl Glycol (SYSTANE) 0.4-0.3 % SOLN Place 2 drops into both eyes 3 (three) times daily as needed.    [provider]    Family History Family History  Problem Relation Age of Onset   Colon cancer Brother    Heart disease Brother    Cancer Father    Colon cancer Brother    Aneurysm Sister     Social History Social History   Tobacco Use   Smoking status: Former Smoker    Packs/day: 2.00    Years: 20.00    Pack years: 40.00    Types: Cigarettes    Last attempt to quit: 02/29/1988    Years since quitting: 30.2   Smokeless tobacco: Never Used  Substance Use Topics   Alcohol use: No    Alcohol/week: 0.0 standard drinks   Drug use: No     Allergies   Patient has no known allergies.   Review of Systems Review of Systems  All other systems reviewed and are negative.    Physical Exam Updated Vital Signs BP (!) 151/85    Pulse 99    Temp 97.6 F (36.4 C) (Oral)    Resp (!) 21    Ht 6' (1.829 m)    Wt 65.8 kg    SpO2 98%    BMI 19.67 kg/m   Physical Exam Vitals signs and nursing note reviewed.  Constitutional:      General: He is not in acute distress.    Appearance: He is well-developed. He is ill-appearing. He is not toxic-appearing or diaphoretic.     Comments: Elderly, frail  HENT:     Head: Normocephalic.     Comments: Small right parietal abrasion without associated crepitation or deformity.    Right Ear: External ear normal.     Left Ear: External ear normal.     Nose: No congestion or rhinorrhea.      Mouth/Throat:     Pharynx: No oropharyngeal exudate or posterior oropharyngeal erythema.  Eyes:     Conjunctiva/sclera: Conjunctivae normal.     Pupils: Pupils are equal, round, and reactive to light.  Neck:     Musculoskeletal: Normal range of motion and neck supple.     Trachea: Phonation normal.  Cardiovascular:  Rate and Rhythm: Normal rate and regular rhythm.     Heart sounds: Normal heart sounds.  Pulmonary:     Effort: Pulmonary effort is normal. No respiratory distress.     Breath sounds: No stridor. Rales (Bilateral about [address] up.) present. No rhonchi.  Chest:     Chest wall: No tenderness.  Abdominal:     Palpations: Abdomen is soft.     Tenderness: There is no abdominal tenderness.  Musculoskeletal: Normal range of motion.     Right lower leg: No edema.     Left lower leg: No edema.  Skin:    General: Skin is warm and dry.     Coloration: Skin is not pale.  Neurological:     Mental Status: He is alert and oriented to person, place, and time.     Cranial Nerves: No cranial nerve deficit.     Sensory: No sensory deficit.     Motor: No abnormal muscle tone.     Coordination: Coordination normal.     Comments: No dysarthria or aphasia.  Psychiatric:        Mood and Affect: Mood normal.        Behavior: Behavior normal.        Thought Content: Thought content normal.        Judgment: Judgment normal.      ED Treatments / Results  Labs (all labs ordered are listed, but only abnormal results are displayed) Labs Reviewed  D-DIMER, QUANTITATIVE (NOT AT [facility]) - Abnormal; Notable for the following components:      Result Value   D-Dimer, Quant 16.15 (*)    All other components within normal limits  COMPREHENSIVE METABOLIC PANEL - Abnormal; Notable for the following components:   Glucose, Bld 133 (*)    Total Protein 5.7 (*)    Total Bilirubin 1.5 (*)    All other components within normal limits  CBC WITH DIFFERENTIAL/PLATELET - Abnormal; Notable for the  following components:   WBC 15.6 (*)    Neutro Abs 12.3 (*)    Abs Immature Granulocytes 0.28 (*)    All other components within normal limits  URINALYSIS, ROUTINE W REFLEX MICROSCOPIC - Abnormal; Notable for the following components:   Specific Gravity, Urine 1.042 (*)    All other components within normal limits  CULTURE, BLOOD (ROUTINE X 2)  CULTURE, BLOOD (ROUTINE X 2)  TROPONIN I  BRAIN NATRIURETIC PEPTIDE  LACTIC ACID, PLASMA    EKG None  Radiology Ct Angio Chest Pe W/cm &/or Wo Cm  Result Date: 05/26/2018 CLINICAL DATA:  Chest pain. EXAM: CT ANGIOGRAPHY CHEST WITH CONTRAST TECHNIQUE: Multidetector CT imaging of the chest was performed using the standard protocol during bolus administration of intravenous contrast. Multiplanar CT image reconstructions and MIPs were obtained to evaluate the vascular anatomy. CONTRAST:  65mL OMNIPAQUE IOHEXOL 350 MG/ML SOLN COMPARISON:  None. FINDINGS: Cardiovascular: Satisfactory opacification of the pulmonary arteries to the segmental level. No evidence of pulmonary embolism. Normal heart size. No pericardial effusion. Coronary artery calcifications are noted. Atherosclerosis of thoracic aorta is noted without aneurysm formation. Mediastinum/Nodes: No enlarged mediastinal, hilar, or axillary lymph nodes. Thyroid gland, trachea, and esophagus demonstrate no significant findings. Lungs/Pleura: No pneumothorax or pleural effusion is noted. Mild bilateral posterior basilar subsegmental atelectasis or edema is noted. Probable edema or pneumonia is noted in the upper lobes. Upper Abdomen: No acute abnormality. Musculoskeletal: No chest wall abnormality. No acute or significant osseous findings. Review of the MIP images confirms  the above findings. IMPRESSION: No definite evidence of pulmonary embolus. Mild bilateral posterior basilar subsegmental atelectasis or edema is noted. Probable mild edema or pneumonia is noted in both upper lobes. Coronary artery  calcifications are noted. Aortic Atherosclerosis (ICD10-I70.0). Electronically Signed   By: Marijo Conception, M.D.   On: 05/26/2018 12:48   Dg Chest Port 1 View  Result Date: 05/26/2018 CLINICAL DATA:  Shortness of breath.  Mental status changes. EXAM: PORTABLE CHEST 1 VIEW COMPARISON:  10/31/2017 and 03/23/2016 FINDINGS: Frontal view of the chest. Reverse apical lordotic positioning. Chin overlies the apices. Normal heart size. Atherosclerosis in the transverse aorta. No pleural effusion or pneumothorax. Low lung volumes. Peripheral and basilar predominant interstitial thickening may be slightly progressive, but is accentuated by technique. No lobar consolidation. IMPRESSION: Interstitial lung disease, accentuated by AP portable technique and low lung volumes. This may be slightly progressive over prior exams. No convincing evidence of acute superimposed process. Aortic Atherosclerosis (ICD10-I70.0). Electronically Signed   By: Abigail Miyamoto M.D.   On: 05/26/2018 10:47    Procedures Procedures (including critical care time)  Medications Ordered in ED Medications  azithromycin (ZITHROMAX) 500 mg in sodium chloride 0.9 % 250 mL IVPB (has no administration in time range)  acetaminophen (TYLENOL) tablet 650 mg (650 mg Oral Given 05/26/18 1054)  iohexol (OMNIPAQUE) 350 MG/ML injection 80 mL (80 mLs Intravenous Contrast Given 05/26/18 1208)  cefTRIAXone (ROCEPHIN) 1 g in sodium chloride 0.9 % 100 mL IVPB (0 g Intravenous Stopped 05/26/18 1453)     Initial Impression / Assessment and Plan / ED Course  I have reviewed the triage vital signs and the nursing notes.  Pertinent labs & imaging results that were available during my care of the patient were reviewed by me and considered in my medical decision making (see chart for details).  Clinical Course as of May 25 1544  Sat May 26, 2018  0935 Orthostatic blood pressure and pulses done, indicating mild hypotensive change with standing but no increase in  pulse.  This is a nondiagnostic, and nonspecific finding.   [EW]  0947 Abnormal, low on room air.  SpO2(!): 83 % [EW]  1105 Elevated  D-dimer, quantitative(!) [EW]  1105 Abnormal, white count high  CBC with Differential(!) [EW]  1106 Nonspecific airways disease without focal infiltrate or acute congestive heart failure.  Images reviewed by me.  DG Chest Port 1 View [EW]  1303 Nonspecific findings, mild fluid overload versus infiltrate, bilaterally.  Of note, images of the thoracic spine, on CT, do not show a fracture or dislocation.  Images reviewed by me.  CT Angio Chest PE W/Cm &/Or Wo Cm [EW]  1305 CT Angio Chest PE W/Cm &/Or Wo Cm [EW]  1310 SIRS positive-elevated heart rate, respiratory rate, WBC and possible infection.  Will send lactate, and start empiric antibiotics   [EW]  1424 normal  Urinalysis, Routine w reflex microscopic(!) [EW]    Clinical Course User Index [EW] Daleen Bo, MD        Patient Vitals for the past 24 hrs:  BP Temp Temp src Pulse Resp SpO2 Height Weight  05/26/18 1500 -- -- -- 99 (!) 21 98 % -- --  05/26/18 1455 -- -- -- 98 (!) 22 94 % -- --  05/26/18 1454 -- -- -- 96 (!) 21 93 % -- --  05/26/18 1453 -- -- -- 97 (!) 27 98 % -- --  05/26/18 1452 -- -- -- 98 (!) 27 96 % -- --  05/26/18 1322 (!) 151/85 -- -- 90 11 90 % -- --  05/26/18 1200 (!) 143/81 -- -- 82 (!) 24 94 % -- --  05/26/18 1130 (!) 141/85 -- -- 77 (!) 22 94 % -- --  05/26/18 1115 (!) 144/77 -- -- 74 (!) 27 94 % -- --  05/26/18 0930 -- -- -- -- -- -- 6' (1.829 m) 65.8 kg  05/26/18 0928 (!) 147/91 97.6 F (36.4 C) Oral 85 (!) 22 (!) 83 % -- --  05/26/18 0924 -- -- -- -- -- 96 % -- --    3:46 PM Reevaluation with update and discussion. After initial assessment and treatment, an updated evaluation reveals no change in status, findings discussed. Daleen Bo   Medical Decision Making: Evaluation for fall, with back pain afterwards.  Incidental hypoxia noted, with abnormal lung exam.   No known exposure to Covid-19, or clear symptoms for same.  Oxygen saturation improved to normal on 2 L nasal cannula but was noted to decrease during the attempt of standing for orthostatic blood pressure and pulse, to 90% even though he was on oxygen at the time.  Fall associated with dizziness, and hypoxia.  No serious injury found.  Screening for PE, with elevated d-dimer required CT imaging.  CT angio does not indicate pulmonary embolism.  CT shows nonspecific infiltrates, edema versus infection.  No prior cardiac echo.  Unknown baseline ventricular ejection fraction.  Patient does not appear fluid or volume overloaded. Patient will require admission for oxygen therapy, and further assessment to ensure appropriate treatment course.  Patient treated with empiric antibiotics for lung infection. SIRS positive.  Lactate normal.  Doubt severe sepsis.  Patient requires hospitalization for treatment and stabilization.    [name] was evaluated in Emergency Department on 05/26/2018 for the symptoms described in the history of present illness. He was evaluated in the context of the global COVID-19 pandemic, which necessitated consideration that the patient might be at risk for infection with the SARS-CoV-2 virus that causes COVID-19. Institutional protocols and algorithms that pertain to the evaluation of patients at risk for COVID-19 are in a state of rapid change based on information released by regulatory bodies including the CDC and federal and state organizations. These policies and algorithms were followed during the patient's care in the ED.   CRITICAL CARE- yes Performed by: Daleen Bo   Nursing Notes Reviewed/ Care Coordinated Applicable Imaging Reviewed Interpretation of Laboratory Data incorporated into ED treatment   3:30 PM-Consult complete with hospitalist. Patient case explained and discussed. He agrees to admit patient for further evaluation and treatment. Call ended at 3:40  PM  Plan: Admit    Final Clinical Impressions(s) / ED Diagnoses   Final diagnoses:  Hypoxia  Community acquired pneumonia, unspecified laterality  Fall, initial encounter  Contusion of back, unspecified laterality, initial encounter    ED Discharge Orders    None       Daleen Bo, MD 05/26/18 1546

## 2018-05-26 NOTE — Progress Notes (Signed)
Son called earlier to get updates about his father, Primary RN was in another pt's room. Primary RN called son at this time nobody picking up the phone, we'll try later.

## 2018-05-26 NOTE — ED Notes (Signed)
Patient returned from CT

## 2018-05-26 NOTE — ED Notes (Signed)
Portable xray @bedside

## 2018-05-26 NOTE — Progress Notes (Signed)
Attempted to call to receive report, Not SBAR

## 2018-05-27 ENCOUNTER — Observation Stay (HOSPITAL_BASED_OUTPATIENT_CLINIC_OR_DEPARTMENT_OTHER): Payer: Medicare Other

## 2018-05-27 DIAGNOSIS — J9601 Acute respiratory failure with hypoxia: Secondary | ICD-10-CM | POA: Diagnosis present

## 2018-05-27 DIAGNOSIS — R339 Retention of urine, unspecified: Secondary | ICD-10-CM | POA: Diagnosis not present

## 2018-05-27 DIAGNOSIS — M6281 Muscle weakness (generalized): Secondary | ICD-10-CM | POA: Diagnosis not present

## 2018-05-27 DIAGNOSIS — R55 Syncope and collapse: Secondary | ICD-10-CM | POA: Diagnosis not present

## 2018-05-27 DIAGNOSIS — Z87891 Personal history of nicotine dependence: Secondary | ICD-10-CM | POA: Diagnosis not present

## 2018-05-27 DIAGNOSIS — Z8249 Family history of ischemic heart disease and other diseases of the circulatory system: Secondary | ICD-10-CM | POA: Diagnosis not present

## 2018-05-27 DIAGNOSIS — I2510 Atherosclerotic heart disease of native coronary artery without angina pectoris: Secondary | ICD-10-CM | POA: Diagnosis present

## 2018-05-27 DIAGNOSIS — Y92129 Unspecified place in nursing home as the place of occurrence of the external cause: Secondary | ICD-10-CM | POA: Diagnosis not present

## 2018-05-27 DIAGNOSIS — J449 Chronic obstructive pulmonary disease, unspecified: Secondary | ICD-10-CM | POA: Diagnosis not present

## 2018-05-27 DIAGNOSIS — Z7401 Bed confinement status: Secondary | ICD-10-CM | POA: Diagnosis not present

## 2018-05-27 DIAGNOSIS — R5381 Other malaise: Secondary | ICD-10-CM | POA: Diagnosis not present

## 2018-05-27 DIAGNOSIS — Y9301 Activity, walking, marching and hiking: Secondary | ICD-10-CM | POA: Diagnosis present

## 2018-05-27 DIAGNOSIS — J189 Pneumonia, unspecified organism: Secondary | ICD-10-CM | POA: Diagnosis present

## 2018-05-27 DIAGNOSIS — M2550 Pain in unspecified joint: Secondary | ICD-10-CM | POA: Diagnosis not present

## 2018-05-27 DIAGNOSIS — M545 Low back pain: Secondary | ICD-10-CM | POA: Diagnosis not present

## 2018-05-27 DIAGNOSIS — E785 Hyperlipidemia, unspecified: Secondary | ICD-10-CM | POA: Diagnosis present

## 2018-05-27 DIAGNOSIS — Z85828 Personal history of other malignant neoplasm of skin: Secondary | ICD-10-CM | POA: Diagnosis not present

## 2018-05-27 DIAGNOSIS — Z79899 Other long term (current) drug therapy: Secondary | ICD-10-CM | POA: Diagnosis not present

## 2018-05-27 DIAGNOSIS — E161 Other hypoglycemia: Secondary | ICD-10-CM | POA: Diagnosis not present

## 2018-05-27 DIAGNOSIS — I361 Nonrheumatic tricuspid (valve) insufficiency: Secondary | ICD-10-CM

## 2018-05-27 DIAGNOSIS — Z7983 Long term (current) use of bisphosphonates: Secondary | ICD-10-CM | POA: Diagnosis not present

## 2018-05-27 DIAGNOSIS — N401 Enlarged prostate with lower urinary tract symptoms: Secondary | ICD-10-CM | POA: Diagnosis present

## 2018-05-27 DIAGNOSIS — R0902 Hypoxemia: Secondary | ICD-10-CM | POA: Diagnosis not present

## 2018-05-27 DIAGNOSIS — R569 Unspecified convulsions: Secondary | ICD-10-CM | POA: Diagnosis not present

## 2018-05-27 DIAGNOSIS — I493 Ventricular premature depolarization: Secondary | ICD-10-CM | POA: Diagnosis present

## 2018-05-27 DIAGNOSIS — R41841 Cognitive communication deficit: Secondary | ICD-10-CM | POA: Diagnosis not present

## 2018-05-27 DIAGNOSIS — J440 Chronic obstructive pulmonary disease with acute lower respiratory infection: Secondary | ICD-10-CM | POA: Diagnosis present

## 2018-05-27 DIAGNOSIS — M1990 Unspecified osteoarthritis, unspecified site: Secondary | ICD-10-CM | POA: Diagnosis present

## 2018-05-27 DIAGNOSIS — Z7982 Long term (current) use of aspirin: Secondary | ICD-10-CM | POA: Diagnosis not present

## 2018-05-27 DIAGNOSIS — J9811 Atelectasis: Secondary | ICD-10-CM | POA: Diagnosis present

## 2018-05-27 DIAGNOSIS — W1839XA Other fall on same level, initial encounter: Secondary | ICD-10-CM | POA: Diagnosis present

## 2018-05-27 DIAGNOSIS — R404 Transient alteration of awareness: Secondary | ICD-10-CM | POA: Diagnosis not present

## 2018-05-27 DIAGNOSIS — S20229A Contusion of unspecified back wall of thorax, initial encounter: Secondary | ICD-10-CM | POA: Diagnosis present

## 2018-05-27 DIAGNOSIS — S20229D Contusion of unspecified back wall of thorax, subsequent encounter: Secondary | ICD-10-CM | POA: Diagnosis not present

## 2018-05-27 DIAGNOSIS — G8929 Other chronic pain: Secondary | ICD-10-CM | POA: Diagnosis present

## 2018-05-27 DIAGNOSIS — R52 Pain, unspecified: Secondary | ICD-10-CM | POA: Diagnosis not present

## 2018-05-27 DIAGNOSIS — I10 Essential (primary) hypertension: Secondary | ICD-10-CM

## 2018-05-27 DIAGNOSIS — R338 Other retention of urine: Secondary | ICD-10-CM | POA: Diagnosis present

## 2018-05-27 DIAGNOSIS — Z8673 Personal history of transient ischemic attack (TIA), and cerebral infarction without residual deficits: Secondary | ICD-10-CM | POA: Diagnosis not present

## 2018-05-27 DIAGNOSIS — M810 Age-related osteoporosis without current pathological fracture: Secondary | ICD-10-CM | POA: Diagnosis present

## 2018-05-27 DIAGNOSIS — M549 Dorsalgia, unspecified: Secondary | ICD-10-CM | POA: Diagnosis present

## 2018-05-27 DIAGNOSIS — W19XXXA Unspecified fall, initial encounter: Secondary | ICD-10-CM | POA: Diagnosis not present

## 2018-05-27 DIAGNOSIS — E162 Hypoglycemia, unspecified: Secondary | ICD-10-CM | POA: Diagnosis not present

## 2018-05-27 DIAGNOSIS — E2681 Bartter's syndrome: Secondary | ICD-10-CM | POA: Diagnosis not present

## 2018-05-27 DIAGNOSIS — I159 Secondary hypertension, unspecified: Secondary | ICD-10-CM | POA: Diagnosis not present

## 2018-05-27 LAB — TROPONIN I
Troponin I: <0.03 ng/mL (ref <0.03)
Troponin I: <0.03 ng/mL (ref <0.03)

## 2018-05-27 LAB — ECHOCARDIOGRAM LIMITED
Height: 72 in
Weight: 2320 oz

## 2018-05-27 LAB — RESPIRATORY PANEL BY PCR
Adenovirus: NOT DETECTED
Bordetella pertussis: NOT DETECTED
CORONAVIRUS NL63-RVPPCR: NOT DETECTED
Chlamydophila pneumoniae: NOT DETECTED
Coronavirus 229E: NOT DETECTED
Coronavirus HKU1: NOT DETECTED
Coronavirus OC43: NOT DETECTED
Influenza A: NOT DETECTED
Influenza B: NOT DETECTED
Metapneumovirus: NOT DETECTED
Mycoplasma pneumoniae: NOT DETECTED
PARAINFLUENZA VIRUS 4-RVPPCR: NOT DETECTED
Parainfluenza Virus 1: NOT DETECTED
Parainfluenza Virus 2: NOT DETECTED
Parainfluenza Virus 3: NOT DETECTED
Respiratory Syncytial Virus: NOT DETECTED
Rhinovirus / Enterovirus: NOT DETECTED

## 2018-05-27 LAB — STREP PNEUMONIAE URINARY ANTIGEN: Strep Pneumo Urinary Antigen: NEGATIVE

## 2018-05-27 LAB — MAGNESIUM: Magnesium: 1.8 mg/dL (ref 1.7–2.4)

## 2018-05-27 LAB — TSH: TSH: 2.109 u[IU]/mL (ref 0.350–4.500)

## 2018-05-27 MED ORDER — SODIUM CHLORIDE 0.9 % IV SOLN: 250.0000 mL | INTRAVENOUS | Status: DC | PRN

## 2018-05-27 MED ORDER — SODIUM CHLORIDE 0.9 % IV SOLN
INTRAVENOUS | Status: DC | PRN
  Administered 2018-05-27 (×2): 500 mL via INTRAVENOUS

## 2018-05-27 MED ORDER — ONDANSETRON HCL 4 MG/2ML IJ SOLN: 4.0000 mg | Freq: Four times a day (QID) | INTRAMUSCULAR | Status: DC | PRN

## 2018-05-27 NOTE — Progress Notes (Signed)
Patient declined ambulation at this time due to pain.  Pain medication administered.  Estrella Alcaraz, RN

## 2018-05-27 NOTE — Progress Notes (Signed)
Droplet precaution placed and PCR panel order. Verified with NP, ruling out only flu infection

## 2018-05-27 NOTE — Progress Notes (Addendum)
Tried to check oxygen saturation on ambulation. Patient was inially 94% on 2 l and as soon as oxygen was removed his oxygen dropped to 79% on  RA. Pateint managed with  help to sit but he stated that he is too weak to move more. Requested to go back to the bed.

## 2018-05-27 NOTE — Progress Notes (Signed)
[facility] PROGRESS NOTE  [name] QMV:[id number] DOB: 1931-12-30 DOA: 05/26/2018 PCP: [name], MD  Assessment/Plan:  1. Acute respiratory failure with hypoxia likely related to bilateral pneumonia. Oxygen saturation 88% on room air at admission this am greater than 90% with 3L [place]. Not on oxygen at home. Troponin negative. CT chest negative for PE but concerning for bilateral edema or pneumonia.  -continue oxygen supplementation -follow blood cultures -sputum culture as able -scheduled nebs -rocephin and azithromycin -mobilize -wean oxygen as able  2. Pneumonia. CT with edema or pneumonia. Patient appears cutely ill. Lactic acid within limits of normal. Strep pneumo urine antigen negative. Max temp 98. Leukocytosis. Rocephin and azithromycin started -continue above antibiotics -follow blood cultures -follow sputum culture -see #2.   3. Syncope. Patient reports "legs give out". Likely related to #2. No prior coughing, nuase of vomiting. Awaiting echo results. EKG with pvc's, trop negative.  -iv fluids -check orthostatics -mobilize  4. HTN. Controlled. Home meds include cozaar -continue home med -monitor  5. Back pain. Pt with hx chronic back pain. Complains that pain is worse since fall/syncope. Xray and CT chest without acute abnormality -mobilize -pain managemtn -if no improvement consider repeat plain  film  Code Status: full Family Communication: spoke to son on phone Disposition Plan: to be determined. Likely home with HH. Of note, patient does not live at facility. Lives with son   Consultants:    Procedures:  Echo with EF 60%  Antibiotics:  Rocephin 05/26/18>>>  Azithromycin 05/26/18>>>  HPI/Subjective: [name]  is a 85 y.o. male, with past medical history significant for CAD, CVA, COPD and BPH presented 3/28 from home for evaluation after a syncopal episode. He had just returned from [place] and he felt dizzy and fell.  No  preceding chest pain or palpitations.  Patient injured the middle part of his lumbar spine with trauma to the ribs as well.  Patient denied any fever, chills, shortness of breath, cough, nausea, vomiting or diarrhea.  In the emergency room the patient was found to be hypoxemic with 88% oxygen on room air and his chest CT showed bilateral upper lobes changes suggestive of pneumonia.  Patient had leukocytosis as well.   This am he continues with sob and not feeling well. Complains back pain. Refusing PT or getting out of bed   Objective: Vitals:   05/27/18 0830 05/27/18 0835  BP:    Pulse:    Resp:    Temp:    SpO2: (!) 87% 92%    Intake/Output Summary (Last 24 hours) at 05/27/2018 1132 Last data filed at 05/27/2018 1005 Gross per 24 hour  Intake 480 ml  Output 100 ml  Net 380 ml   Filed Weights   05/26/18 0930 05/26/18 1812 05/27/18 0412  Weight: 65.8 kg 69.4 kg 65.8 kg    Exam:   General:  Awake alert appears acutely ill but no distress  Cardiovascular: rrr no mgr no LE edema  Respiratory: mild increased work of breathing with conversation. BS with fair air movement faint crackles bilateral bases  Abdomen: non-distended non-tender +BS no guarding or rebounding  Musculoskeletal: joints without swelling/erythema   Data Reviewed: Basic Metabolic Panel: Recent Labs  Lab 05/26/18 1015 05/27/18 0709  NA 135  --   K 3.8  --   CL 104  --   CO2 22  --   GLUCOSE 133*  --   BUN 17  --   CREATININE 0.75  --   CALCIUM  8.9  --   MG  --  1.8   Liver Function Tests: Recent Labs  Lab 05/26/18 1015  AST 30  ALT 43  ALKPHOS 69  BILITOT 1.5*  PROT 5.7*  ALBUMIN 3.5   No results for input(s): LIPASE, AMYLASE in the last 168 hours. No results for input(s): AMMONIA in the last 168 hours. CBC: Recent Labs  Lab 05/26/18 1015  WBC 15.6*  NEUTROABS 12.3*  HGB 15.5  HCT 46.6  MCV 92.6  PLT 227   Cardiac Enzymes: Recent Labs  Lab 05/26/18 1015 05/26/18 1933  05/27/18 0051 05/27/18 0709  TROPONINI <0.03 <0.03 <0.03 <0.03   BNP (last 3 results) Recent Labs    05/26/18 1015  BNP 27.5    ProBNP (last 3 results) No results for input(s): PROBNP in the last 8760 hours.  CBG: No results for input(s): GLUCAP in the last 168 hours.  No results found for this or any previous visit (from the past 240 hour(s)).   Studies: Ct Angio Chest Pe W/cm &/or Wo Cm  Result Date: 05/26/2018 CLINICAL DATA:  Chest pain. EXAM: CT ANGIOGRAPHY CHEST WITH CONTRAST TECHNIQUE: Multidetector CT imaging of the chest was performed using the standard protocol during bolus administration of intravenous contrast. Multiplanar CT image reconstructions and MIPs were obtained to evaluate the vascular anatomy. CONTRAST:  63mL OMNIPAQUE IOHEXOL 350 MG/ML SOLN COMPARISON:  None. FINDINGS: Cardiovascular: Satisfactory opacification of the pulmonary arteries to the segmental level. No evidence of pulmonary embolism. Normal heart size. No pericardial effusion. Coronary artery calcifications are noted. Atherosclerosis of thoracic aorta is noted without aneurysm formation. Mediastinum/Nodes: No enlarged mediastinal, hilar, or axillary lymph nodes. Thyroid gland, trachea, and esophagus demonstrate no significant findings. Lungs/Pleura: No pneumothorax or pleural effusion is noted. Mild bilateral posterior basilar subsegmental atelectasis or edema is noted. Probable edema or pneumonia is noted in the upper lobes. Upper Abdomen: No acute abnormality. Musculoskeletal: No chest wall abnormality. No acute or significant osseous findings. Review of the MIP images confirms the above findings. IMPRESSION: No definite evidence of pulmonary embolus. Mild bilateral posterior basilar subsegmental atelectasis or edema is noted. Probable mild edema or pneumonia is noted in both upper lobes. Coronary artery calcifications are noted. Aortic Atherosclerosis (ICD10-I70.0). Electronically Signed   By: Marijo Conception, M.D.   On: 05/26/2018 12:48   Dg Chest Port 1 View  Result Date: 05/26/2018 CLINICAL DATA:  Shortness of breath.  Mental status changes. EXAM: PORTABLE CHEST 1 VIEW COMPARISON:  10/31/2017 and 03/23/2016 FINDINGS: Frontal view of the chest. Reverse apical lordotic positioning. Chin overlies the apices. Normal heart size. Atherosclerosis in the transverse aorta. No pleural effusion or pneumothorax. Low lung volumes. Peripheral and basilar predominant interstitial thickening may be slightly progressive, but is accentuated by technique. No lobar consolidation. IMPRESSION: Interstitial lung disease, accentuated by AP portable technique and low lung volumes. This may be slightly progressive over prior exams. No convincing evidence of acute superimposed process. Aortic Atherosclerosis (ICD10-I70.0). Electronically Signed   By: Abigail Miyamoto M.D.   On: 05/26/2018 10:47    Scheduled Meds: . albuterol  2.5 mg Nebulization Q6H  . aspirin EC  81 mg Oral Daily  . atorvastatin  20 mg Oral Daily  . budesonide  0.25 mg Inhalation BID  . enoxaparin (LOVENOX) injection  40 mg Subcutaneous Q24H  . losartan  100 mg Oral Daily  . sodium chloride flush  3 mL Intravenous Q12H   Continuous Infusions: . sodium chloride    . azithromycin    .  cefTRIAXone (ROCEPHIN)  IV      Principal Problem:   Acute respiratory failure with hypoxia (HCC) Active Problems:   Syncope   Bilateral pneumonia   HTN (hypertension)   Hyperlipidemia    Time spent: 51 minutes    [place] NP  [facility]  If 7PM-7AM, please contact night-coverage at [email or web address], password [facility] 05/27/2018, 11:32 AM  LOS: 0 days

## 2018-05-27 NOTE — Evaluation (Addendum)
Physical Therapy Evaluation Patient Details Name: [name] MRN: [id number] DOB: [birth year] Today's Date: 05/27/2018   History of Present Illness  85 yo admitted from home after syncopal episode with bil PNA. PMhx: BPH, COPD, CAD, CVA, HLD, HTN, squamous cell carcinoma  Clinical Impression  PT pleasant, fatigued and willing to mobilize but able to state SOB with mobility. Pt with desaturation with bed mobility, standing and pivoting even on 4L [place] desaturation to 84% with recovery time and 4L able to maintain 93% at rest. Pt with decreased strength, balance, function and mobility who will benefit from acute therapy to maximize mobility, function and safety. Pt normally very independent driving and walking without assist.  HR 93 Pt given IS end of session with instruction with pt barely able to pull 300cc    Follow Up Recommendations SNF;Supervision/Assistance - 24 hour    Equipment Recommendations  None recommended by PT    Recommendations for Other Services       Precautions / Restrictions Precautions Precautions: Fall Precaution Comments: watch sats      Mobility  Bed Mobility Overal bed mobility: Needs Assistance Bed Mobility: Supine to Sit     Supine to sit: Min assist;HOB elevated     General bed mobility comments: HOB 20 degrees with min asssit to roll to side and elevate trunk. PT struggling and reporting back pain. Pt on 2L supine and with sitting EOb dropped to 85% bumped to 4L  Transfers Overall transfer level: Needs assistance   Transfers: Sit to/from Stand;Stand Pivot Transfers Sit to Stand: Min assist Stand pivot transfers: Min assist       General transfer comment: min assist to rise from surface and pivot to chair with bil UE supported on therapist arms. Pt with desaturation on 4L to 84% with basic stand and pivot required 5L to recover with cues for breathing technique  Ambulation/Gait                Stairs            Wheelchair  Mobility    Modified Rankin (Stroke Patients Only)       Balance Overall balance assessment: Needs assistance   Sitting balance-Leahy Scale: Fair       Standing balance-Leahy Scale: Poor Standing balance comment: pt required UE assist for stability and balance                              Pertinent Vitals/Pain Pain Assessment: 0-10 Pain Score: 4  Pain Location: back Pain Descriptors / Indicators: Aching;Constant Pain Intervention(s): Limited activity within patient's tolerance;Monitored during session;Repositioned    Home Living Family/patient expects to be discharged to:: Private residence Living Arrangements: Children Available Help at Discharge: Family;Available 24 hours/day Type of Home: House Home Access: Stairs to enter   [employer] of Steps: 3 Home Layout: Two level;Able to live on main level with bedroom/bathroom Home Equipment: Gilford Rile - 2 wheels;Cane - single point;Walker - 4 wheels      Prior Function Level of Independence: Independent               Hand Dominance        Extremity/Trunk Assessment   Upper Extremity Assessment Upper Extremity Assessment: Generalized weakness    Lower Extremity Assessment Lower Extremity Assessment: Generalized weakness    Cervical / Trunk Assessment Cervical / Trunk Assessment: Kyphotic  Communication   Communication: No difficulties  Cognition Arousal/Alertness: Awake/alert Behavior During Therapy:  WFL for tasks assessed/performed Overall Cognitive Status: Within Functional Limits for tasks assessed                                        General Comments      Exercises     Assessment/Plan    PT Assessment Patient needs continued PT services  PT Problem List Decreased strength;Decreased activity tolerance;Decreased mobility;Cardiopulmonary status limiting activity;Decreased knowledge of use of DME;Decreased balance       PT Treatment Interventions DME  instruction;Functional mobility training;Balance training;Patient/family education;Gait training;Therapeutic activities;Stair training;Therapeutic exercise    PT Goals (Current goals can be found in the Care Plan section)  Acute Rehab PT Goals Patient Stated Goal: return home PT Goal Formulation: With patient Time For Goal Achievement: 06/10/18 Potential to Achieve Goals: Good    Frequency Min 3X/week   Barriers to discharge        Co-evaluation               AM-PAC PT "6 Clicks" Mobility  Outcome Measure Help needed turning from your back to your side while in a flat bed without using bedrails?: A Little Help needed moving from lying on your back to sitting on the side of a flat bed without using bedrails?: A Little Help needed moving to and from a bed to a chair (including a wheelchair)?: A Little Help needed standing up from a chair using your arms (e.g., wheelchair or bedside chair)?: A Little Help needed to walk in hospital room?: A Lot Help needed climbing 3-5 steps with a railing? : Total 6 Click Score: 15    End of Session Equipment Utilized During Treatment: Gait belt;Oxygen Activity Tolerance: Patient limited by fatigue Patient left: in chair;with call bell/phone within reach;with chair alarm set;with nursing/sitter in room Nurse Communication: Mobility status PT Visit Diagnosis: Other abnormalities of gait and mobility (R26.89);Muscle weakness (generalized) (M62.81);Difficulty in walking, not elsewhere classified (R26.2)    Time: 6754-4920 PT Time Calculation (min) (ACUTE ONLY): 23 min   Charges:   PT Evaluation $PT Eval Moderate Complexity: 1 Mod PT Treatments $Therapeutic Activity: 8-22 mins        Chloe Bluett Pam Drown, PT Acute Rehabilitation Services Pager: [phone] Office: [place] 05/27/2018, 1:53 PM

## 2018-05-27 NOTE — Progress Notes (Signed)
  Echocardiogram 2D Echocardiogram limited has been performed due to patient contact precautions.  [name] 05/27/2018, 9:32 AM

## 2018-05-28 ENCOUNTER — Encounter (HOSPITAL_COMMUNITY): Payer: Self-pay

## 2018-05-28 ENCOUNTER — Inpatient Hospital Stay (HOSPITAL_COMMUNITY): Payer: Medicare Other

## 2018-05-28 LAB — BASIC METABOLIC PANEL
Anion gap: 10 (ref 5–15)
BUN: 11 mg/dL (ref 8–23)
CO2: 22 mmol/L (ref 22–32)
Calcium: 8.5 mg/dL — ABNORMAL LOW (ref 8.9–10.3)
Chloride: 104 mmol/L (ref 98–111)
Creatinine, Ser: 0.7 mg/dL (ref 0.61–1.24)
GFR calc Af Amer: >60 mL/min (ref >60)
GFR calc non Af Amer: >60 mL/min (ref >60)
Glucose, Bld: 141 mg/dL — ABNORMAL HIGH (ref 70–99)
POTASSIUM: 4 mmol/L (ref 3.5–5.1)
Sodium: 136 mmol/L (ref 135–145)

## 2018-05-28 LAB — CBC
HCT: 43.8 % (ref 39.0–52.0)
Hemoglobin: 14.4 g/dL (ref 13.0–17.0)
MCH: 29.9 pg (ref 26.0–34.0)
MCHC: 32.9 g/dL (ref 30.0–36.0)
MCV: 90.9 fL (ref 80.0–100.0)
Platelets: 177 10*3/uL (ref 150–400)
RBC: 4.82 MIL/uL (ref 4.22–5.81)
RDW: 14 % (ref 11.5–15.5)
WBC: 12.4 10*3/uL — ABNORMAL HIGH (ref 4.0–10.5)
nRBC: 0 % (ref 0.0–0.2)

## 2018-05-28 LAB — BLOOD CULTURE ID PANEL (REFLEXED)
Acinetobacter baumannii: NOT DETECTED
Candida albicans: NOT DETECTED
Candida glabrata: NOT DETECTED
Candida krusei: NOT DETECTED
Candida parapsilosis: NOT DETECTED
Candida tropicalis: NOT DETECTED
Enterobacter cloacae complex: NOT DETECTED
Enterobacteriaceae species: NOT DETECTED
Enterococcus species: NOT DETECTED
Escherichia coli: NOT DETECTED
Haemophilus influenzae: NOT DETECTED
Klebsiella oxytoca: NOT DETECTED
Klebsiella pneumoniae: NOT DETECTED
Listeria monocytogenes: NOT DETECTED
Methicillin resistance: NOT DETECTED
Neisseria meningitidis: NOT DETECTED
PSEUDOMONAS AERUGINOSA: NOT DETECTED
Proteus species: NOT DETECTED
Serratia marcescens: NOT DETECTED
Staphylococcus aureus (BCID): NOT DETECTED
Staphylococcus species: DETECTED — AB
Streptococcus agalactiae: NOT DETECTED
Streptococcus pneumoniae: NOT DETECTED
Streptococcus pyogenes: NOT DETECTED
Streptococcus species: NOT DETECTED

## 2018-05-28 MED ORDER — FUROSEMIDE 10 MG/ML IJ SOLN
20.0000 mg | INTRAMUSCULAR | Status: AC
  Administered 2018-05-28: 20 mg via INTRAVENOUS
  Filled 2018-05-28: qty 2

## 2018-05-28 MED ORDER — GUAIFENESIN ER 600 MG PO TB12
600.0000 mg | ORAL_TABLET | Freq: Two times a day (BID) | ORAL | Status: DC
  Administered 2018-05-28 – 2018-05-31 (×7): 600 mg via ORAL
  Filled 2018-05-28 (×7): qty 1

## 2018-05-28 MED ORDER — LIDOCAINE 5 % EX PTCH
1.0000 | MEDICATED_PATCH | CUTANEOUS | Status: DC
  Administered 2018-05-28 – 2018-05-31 (×4): 1 via TRANSDERMAL
  Filled 2018-05-28 (×4): qty 1

## 2018-05-28 MED ORDER — FUROSEMIDE 10 MG/ML IJ SOLN: 40.0000 mg | Freq: Two times a day (BID) | INTRAMUSCULAR | Status: DC

## 2018-05-28 NOTE — Progress Notes (Signed)
Progress Note    [name]  JIR:[id number] DOB: 05-20-1931  DOA: 05/26/2018 PCP: [name], MD    Brief Narrative:   Chief complaint: Syncope  Medical records reviewed and are as summarized below:  [name] is an 85 y.o. male CAD, CVA, COPD and BPH presented 3/28 from home for evaluation after a syncopal episode. He had just returned from [place] and he felt dizzy and fell.   Assessment/Plan:   Principal Problem:   Acute respiratory failure with hypoxia (HCC) Active Problems:   HTN (hypertension)   Hyperlipidemia   Syncope   Bilateral pneumonia 1. Acute respiratory failure with hypoxia secondary to bilateral pneumonia: Patient with oxygen saturation 88% on room air at admission this am greater than 90% with 3L [place]. Not on oxygen at home. Troponin negative. CT chest negative for PE, but concerning for bilateral edema or pneumonia.  Respiratory virus panel negative.  Overnight O2 saturations acutely dropped into 7 early in a.m. on 3/30, requiring patient to be placed on Ventimask.  Blood cultures have remained negative.  On physical exam patient appears to have crackles concerning for fluid has been on normal saline IV fluids since admission. -Continue supplemental oxygen  -Wean oxygen to room air as able -Follow blood cultures  -Sputum culture as able -Scheduled nebs -Rocephin and azithromycin -mobilize   -Flutter valve -Mucinex -Stop IV fluids and give 40 mg of Lasix question possibility of patient being fluid overloaded   2. Syncope, PVCs. Patient reports "legs give out". Likely related to #2. No prior coughing, nuase of vomiting.  Echo revealing EF of 60 to 65% with left ventricular diastolic function not able to be evaluated EKG with frequent PVCs during hospitalization. -Physical therapy to eval and treat -May benefit from Holter monitor at discharge  3. HTN. Controlled. Home meds include cozaar -continue home med -monitor  5. Back pain.  Pt with hx chronic back pain. Complains that pain is worse since fall/syncope. Xray and CT chest without acute abnormality as to cause of pain.  He does report that it is improving. -mobilize with physical therapy -Add lidocaine patch for pain management  Body mass index is 19.67 kg/m.   Family Communication/Anticipated D/C date and plan/Code Status   DVT prophylaxis: Lovenox ordered. Code Status: Full Code.  Family Communication: No family present at bedside Disposition Plan: Possible discharge home in 2 to 3 days   Medical Consultants:    None.   Anti-Infectives:    Rocephin and azithromycin day 3   Subjective:   Overnight patient reports having onset of a cough and worsening shortness of breath.  Reports coughing up thick sputum.  Notes that back pain symptoms are improving.  Nursing report overnight noted desats into the 80s on 3 L of nasal cannula oxygen requiring placement to a Ventri mask at 6 L.  Objective:    Vitals:   05/28/18 0329 05/28/18 0342 05/28/18 0536 05/28/18 0659  BP:   (!) 152/90   Pulse:   (!) 102   Resp:   19   Temp:   98.3 F (36.8 C)   TempSrc:   Oral   SpO2: (!) 88% 90% (!) 89% 90%  Weight:      Height:        Intake/Output Summary (Last 24 hours) at 05/28/2018 0958 Last data filed at 05/28/2018 0538 Gross per 24 hour  Intake 820 ml  Output 750 ml  Net 70 ml   [employer]  05/27/18 0412 05/28/18 0043 05/28/18 0048  Weight: 65.8 kg 68.9 kg 65.8 kg    Exam: Constitutional: Elderly male who appears acutely ill NAD, calm, comfortable Eyes: PERRL, lids and conjunctivae normal ENMT: Mucous membranes are moist. Posterior pharynx clear of any exudate or lesions.  Neck: normal, supple, no masses, no thyromegaly Respiratory: Decreased overall aeration with positive crackles in the mid to lower lung fields. Cardiovascular: Regular rate and rhythm, no murmurs / rubs / gallops. No extremity edema. 2+ pedal pulses. No carotid bruits.   Abdomen: no tenderness, no masses palpated. No hepatosplenomegaly. Bowel sounds positive.  Musculoskeletal: no clubbing / cyanosis. No joint deformity upper and lower extremities. Good ROM, no contractures. Normal muscle tone.  Skin: no rashes, lesions, ulcers. No induration Neurologic: CN 2-12 grossly intact. Sensation intact, DTR normal. Strength 5/5 in all 4.  Psychiatric: Normal judgment and insight. Alert and oriented x 3. Normal mood.    Data Reviewed:   I have personally reviewed following labs and imaging studies:  Labs: Labs show the following:   Basic Metabolic Panel: Recent Labs  Lab 05/26/18 1015 05/27/18 0709 05/28/18 0500  NA 135  --  136  K 3.8  --  4.0  CL 104  --  104  CO2 22  --  22  GLUCOSE 133*  --  141*  BUN 17  --  11  CREATININE 0.75  --  0.70  CALCIUM 8.9  --  8.5*  MG  --  1.8  --    GFR Estimated Creatinine Clearance: 64 mL/min (by C-G formula based on SCr of 0.7 mg/dL). Liver Function Tests: Recent Labs  Lab 05/26/18 1015  AST 30  ALT 43  ALKPHOS 69  BILITOT 1.5*  PROT 5.7*  ALBUMIN 3.5   No results for input(s): LIPASE, AMYLASE in the last 168 hours. No results for input(s): AMMONIA in the last 168 hours. Coagulation profile No results for input(s): INR, PROTIME in the last 168 hours.  CBC: Recent Labs  Lab 05/26/18 1015 05/28/18 0500  WBC 15.6* 12.4*  NEUTROABS 12.3*  --   HGB 15.5 14.4  HCT 46.6 43.8  MCV 92.6 90.9  PLT 227 177   Cardiac Enzymes: Recent Labs  Lab 05/26/18 1015 05/26/18 1933 05/27/18 0051 05/27/18 0709  TROPONINI <0.03 <0.03 <0.03 <0.03   BNP (last 3 results) No results for input(s): PROBNP in the last 8760 hours. CBG: No results for input(s): GLUCAP in the last 168 hours. D-Dimer: Recent Labs    05/26/18 1015  DDIMER 16.15*   Hgb A1c: No results for input(s): HGBA1C in the last 72 hours. Lipid Profile: No results for input(s): CHOL, HDL, LDLCALC, TRIG, CHOLHDL, LDLDIRECT in the last 72  hours. Thyroid function studies: Recent Labs    05/27/18 1206  TSH 2.109   Anemia work up: No results for input(s): VITAMINB12, FOLATE, FERRITIN, TIBC, IRON, RETICCTPCT in the last 72 hours. Sepsis Labs: Recent Labs  Lab 05/26/18 1015 05/26/18 1330 05/28/18 0500  WBC 15.6*  --  12.4*  LATICACIDVEN  --  1.4  --     Microbiology Recent Results (from the past 240 hour(s))  Culture, blood (routine x 2)     Status: None (Preliminary result)   Collection Time: 05/26/18  1:30 PM  Result Value Ref Range Status   Specimen Description BLOOD RIGHT ANTECUBITAL  Final   Special Requests   Final    BOTTLES DRAWN AEROBIC AND ANAEROBIC Blood Culture adequate volume   Culture  Setup  Time   Final    GRAM POSITIVE COCCI IN CLUSTERS AEROBIC BOTTLE ONLY CRITICAL RESULT CALLED TO, READ BACK BY AND VERIFIED WITH: J. LEDFORD,PHARMD [id number] 05/28/2018 Mena Goes Performed at [facility], [address] [place], [place] [address]    Culture GRAM POSITIVE COCCI  Final   Report Status PENDING  Incomplete  Blood Culture ID Panel (Reflexed)     Status: Abnormal   Collection Time: 05/26/18  1:30 PM  Result Value Ref Range Status   Enterococcus species NOT DETECTED NOT DETECTED Final   Listeria monocytogenes NOT DETECTED NOT DETECTED Final   Staphylococcus species DETECTED (A) NOT DETECTED Final    Comment: Methicillin (oxacillin) susceptible coagulase negative staphylococcus. Possible blood culture contaminant (unless isolated from more than one blood culture draw or clinical case suggests pathogenicity). No antibiotic treatment is indicated for blood  culture contaminants. CRITICAL RESULT CALLED TO, READ BACK BY AND VERIFIED WITH: J. LEDFORD,PHARMD [id number] 05/28/2018 T. TYSOR    Staphylococcus aureus (BCID) NOT DETECTED NOT DETECTED Final   Methicillin resistance NOT DETECTED NOT DETECTED Final   Streptococcus species NOT DETECTED NOT DETECTED Final   Streptococcus agalactiae NOT DETECTED NOT  DETECTED Final   Streptococcus pneumoniae NOT DETECTED NOT DETECTED Final   Streptococcus pyogenes NOT DETECTED NOT DETECTED Final   Acinetobacter baumannii NOT DETECTED NOT DETECTED Final   Enterobacteriaceae species NOT DETECTED NOT DETECTED Final   Enterobacter cloacae complex NOT DETECTED NOT DETECTED Final   Escherichia coli NOT DETECTED NOT DETECTED Final   Klebsiella oxytoca NOT DETECTED NOT DETECTED Final   Klebsiella pneumoniae NOT DETECTED NOT DETECTED Final   Proteus species NOT DETECTED NOT DETECTED Final   Serratia marcescens NOT DETECTED NOT DETECTED Final   Haemophilus influenzae NOT DETECTED NOT DETECTED Final   Neisseria meningitidis NOT DETECTED NOT DETECTED Final   Pseudomonas aeruginosa NOT DETECTED NOT DETECTED Final   Candida albicans NOT DETECTED NOT DETECTED Final   Candida glabrata NOT DETECTED NOT DETECTED Final   Candida krusei NOT DETECTED NOT DETECTED Final   Candida parapsilosis NOT DETECTED NOT DETECTED Final   Candida tropicalis NOT DETECTED NOT DETECTED Final    Comment: Performed at [facility], [address], [place], [place] [address]  Culture, blood (routine x 2)     Status: None (Preliminary result)   Collection Time: 05/26/18  1:36 PM  Result Value Ref Range Status   Specimen Description BLOOD LEFT FOREARM  Final   Special Requests   Final    BOTTLES DRAWN AEROBIC AND ANAEROBIC Blood Culture adequate volume   Culture   Final    NO GROWTH < 24 HOURS Performed at [facility], [place] [address], [place], [place] [address]    Report Status PENDING  Incomplete  Respiratory Panel by PCR     Status: None   Collection Time: 05/27/18 12:18 PM  Result Value Ref Range Status   Adenovirus NOT DETECTED NOT DETECTED Final   Coronavirus 229E NOT DETECTED NOT DETECTED Final    Comment: (NOTE) The Coronavirus on the Respiratory Panel, DOES NOT test for the novel  Coronavirus (2019 nCoV)    Coronavirus HKU1 NOT DETECTED NOT DETECTED Final    Coronavirus NL63 NOT DETECTED NOT DETECTED Final   Coronavirus OC43 NOT DETECTED NOT DETECTED Final   Metapneumovirus NOT DETECTED NOT DETECTED Final   Rhinovirus / Enterovirus NOT DETECTED NOT DETECTED Final   Influenza A NOT DETECTED NOT DETECTED Final   Influenza B NOT  DETECTED NOT DETECTED Final   Parainfluenza Virus 1 NOT DETECTED NOT DETECTED Final   Parainfluenza Virus 2 NOT DETECTED NOT DETECTED Final   Parainfluenza Virus 3 NOT DETECTED NOT DETECTED Final   Parainfluenza Virus 4 NOT DETECTED NOT DETECTED Final   Respiratory Syncytial Virus NOT DETECTED NOT DETECTED Final   Bordetella pertussis NOT DETECTED NOT DETECTED Final   Chlamydophila pneumoniae NOT DETECTED NOT DETECTED Final   Mycoplasma pneumoniae NOT DETECTED NOT DETECTED Final    Comment: Performed at [facility], [place] [address], [place], [place] [address]    Procedures and diagnostic studies:  Dg Chest 2 View  Result Date: 05/28/2018 CLINICAL DATA:  Recent fall with hypoxia EXAM: CHEST - 2 VIEW COMPARISON:  05/26/2018 FINDINGS: Cardiac shadow is within normal limits. Aortic calcifications are again seen. The overall inspiratory effort is poor. Mild bibasilar atelectatic changes are noted similar to that seen on prior CT examination. No focal confluent infiltrate or sizable effusion is seen. IMPRESSION: Bibasilar atelectatic changes stable from the previous exam. No new focal abnormality is seen. Electronically Signed   By: Inez Catalina M.D.   On: 05/28/2018 08:18   Ct Angio Chest Pe W/cm &/or Wo Cm  Result Date: 05/26/2018 CLINICAL DATA:  Chest pain. EXAM: CT ANGIOGRAPHY CHEST WITH CONTRAST TECHNIQUE: Multidetector CT imaging of the chest was performed using the standard protocol during bolus administration of intravenous contrast. Multiplanar CT image reconstructions and MIPs were obtained to evaluate the vascular anatomy. CONTRAST:  75mL OMNIPAQUE IOHEXOL 350 MG/ML SOLN COMPARISON:  None. FINDINGS:  Cardiovascular: Satisfactory opacification of the pulmonary arteries to the segmental level. No evidence of pulmonary embolism. Normal heart size. No pericardial effusion. Coronary artery calcifications are noted. Atherosclerosis of thoracic aorta is noted without aneurysm formation. Mediastinum/Nodes: No enlarged mediastinal, hilar, or axillary lymph nodes. Thyroid gland, trachea, and esophagus demonstrate no significant findings. Lungs/Pleura: No pneumothorax or pleural effusion is noted. Mild bilateral posterior basilar subsegmental atelectasis or edema is noted. Probable edema or pneumonia is noted in the upper lobes. Upper Abdomen: No acute abnormality. Musculoskeletal: No chest wall abnormality. No acute or significant osseous findings. Review of the MIP images confirms the above findings. IMPRESSION: No definite evidence of pulmonary embolus. Mild bilateral posterior basilar subsegmental atelectasis or edema is noted. Probable mild edema or pneumonia is noted in both upper lobes. Coronary artery calcifications are noted. Aortic Atherosclerosis (ICD10-I70.0). Electronically Signed   By: Marijo Conception, M.D.   On: 05/26/2018 12:48   Dg Chest Port 1 View  Result Date: 05/26/2018 CLINICAL DATA:  Shortness of breath.  Mental status changes. EXAM: PORTABLE CHEST 1 VIEW COMPARISON:  10/31/2017 and 03/23/2016 FINDINGS: Frontal view of the chest. Reverse apical lordotic positioning. Chin overlies the apices. Normal heart size. Atherosclerosis in the transverse aorta. No pleural effusion or pneumothorax. Low lung volumes. Peripheral and basilar predominant interstitial thickening may be slightly progressive, but is accentuated by technique. No lobar consolidation. IMPRESSION: Interstitial lung disease, accentuated by AP portable technique and low lung volumes. This may be slightly progressive over prior exams. No convincing evidence of acute superimposed process. Aortic Atherosclerosis (ICD10-I70.0).  Electronically Signed   By: Abigail Miyamoto M.D.   On: 05/26/2018 10:47    Medications:   . albuterol  2.5 mg Nebulization Q6H  . aspirin EC  81 mg Oral Daily  . atorvastatin  20 mg Oral Daily  . budesonide  0.25 mg Inhalation BID  . enoxaparin (LOVENOX) injection  40 mg Subcutaneous Q24H  .  guaiFENesin  600 mg Oral BID  . lidocaine  1 patch Transdermal Q24H  . losartan  100 mg Oral Daily   Continuous Infusions: . sodium chloride 500 mL (05/27/18 1448)  . azithromycin Stopped (05/27/18 1438)  . cefTRIAXone (ROCEPHIN)  IV 1 g (05/27/18 1450)     LOS: 1 day   Nadia Torr A Aliz Meritt  [facility]   *Please refer to [email or web address], password [email or web address] to get updated schedule on who will round on this patient, as hospitalists switch teams weekly. If 7PM-7AM, please contact night-coverage at [email or web address], password [email or web address] for any overnight needs.

## 2018-05-28 NOTE — NC FL2 (Signed)
[place] LEVEL OF CARE SCREENING TOOL     IDENTIFICATION  Patient Name: [name] Birthdate: 14-Mar-1931 Sex: male Admission Date (Current Location): 05/26/2018  [facility] and [place] Number:  Herbalist and Address:  [facility], [place] [address] [place], [place] [address]      Provider Number: [id number]  Attending Physician Name and Address:  Norval Morton, MD  Relative Name and Phone Number:       Current Level of Care: Hospital Recommended Level of Care: [place] Prior Approval Number:    Date Approved/Denied:   PASRR Number: [id number]  Discharge Plan: SNF    Current Diagnoses: Patient Active Problem List   Diagnosis Date Noted  . Acute respiratory failure with hypoxia ([place]) 05/27/2018  . Bilateral pneumonia 05/27/2018  . Syncope 05/26/2018  . Thyroid nodule 04/12/2018  . Physical exam 10/08/2014  . Facial skin lesion 04/08/2014  . Elevated WBCs 11/22/2013  . HTN (hypertension) 10/15/2013  . Osteoporosis 10/15/2013  . History of CVA (cerebrovascular accident) 10/15/2013  . History of compression fracture of spine 10/15/2013  . Recurrent pneumonia 10/15/2013  . Hyperlipidemia 10/15/2013  . Low testosterone 10/15/2013  . Carotid stenosis 10/15/2013    Orientation RESPIRATION BLADDER Height & Weight     Self, Time, Situation, Place  O2(Nasal Canula 6 L. No oxygen at home. Hoping they can wean him off before discharge.) Continent, External catheter Weight: 145 lb 1 oz (65.8 kg) Height:  6' (182.9 cm)  BEHAVIORAL SYMPTOMS/MOOD NEUROLOGICAL BOWEL NUTRITION STATUS  (None) (History of CVA) Continent Diet(Regular)  AMBULATORY STATUS COMMUNICATION OF NEEDS Skin   Extensive Assist Verbally Skin abrasions, Bruising, Other (Comment)(Skin tear.)                       Personal Care Assistance Level of Assistance              Functional Limitations Info  Sight, Hearing, Speech Sight  Info: Adequate Hearing Info: Adequate Speech Info: Adequate    SPECIAL CARE FACTORS FREQUENCY  PT (By licensed PT), Blood pressure     PT Frequency: 5 x week              Contractures Contractures Info: Not present    Additional Factors Info  Code Status, Allergies Code Status Info: Full Allergies Info: NKDA           Current Medications (05/28/2018):  This is the current hospital active medication list Current Facility-Administered Medications  Medication Dose Route Frequency Provider Last Rate Last Dose  . 0.9 %  sodium chloride infusion   Intravenous PRN Fuller Plan A, MD 10 mL/hr at 05/27/18 1448 500 mL at 05/27/18 1448  . acetaminophen (TYLENOL) tablet 650 mg  650 mg Oral Q6H PRN Merton Border, MD   650 mg at 05/26/18 2015   Or  . acetaminophen (TYLENOL) suppository 650 mg  650 mg Rectal Q6H PRN Merton Border, MD      . diphenhydrAMINE (BENADRYL) capsule 25 mg  25 mg Oral QHS PRN Merton Border, MD       And  . acetaminophen (TYLENOL) tablet 500 mg  500 mg Oral QHS PRN Merton Border, MD      . albuterol (PROVENTIL) (2.5 MG/3ML) 0.083% nebulizer solution 2.5 mg  2.5 mg Nebulization Q6H Merton Border, MD   2.5 mg at 05/28/18 0855  . aspirin EC tablet 81 mg  81 mg Oral Daily Merton Border,  MD   81 mg at 05/28/18 0846  . atorvastatin (LIPITOR) tablet 20 mg  20 mg Oral Daily Merton Border, MD   20 mg at 05/28/18 0847  . azithromycin (ZITHROMAX) 500 mg in sodium chloride 0.9 % 250 mL IVPB  500 mg Intravenous Q24H Merton Border, MD   Stopped at 05/27/18 1438  . bisacodyl (DULCOLAX) EC tablet 5 mg  5 mg Oral Daily PRN Merton Border, MD      . budesonide (PULMICORT) nebulizer solution 0.25 mg  0.25 mg Inhalation BID Merton Border, MD   0.25 mg at 05/28/18 0855  . cefTRIAXone (ROCEPHIN) 1 g in sodium chloride 0.9 % 100 mL IVPB  1 g Intravenous Q24H Merton Border, MD 200 mL/hr at 05/27/18 1450 1 g at 05/27/18 1450  . enoxaparin (LOVENOX) injection 40 mg  40 mg Subcutaneous Q24H Merton Border, MD   40  mg at 05/27/18 2131  . guaiFENesin (MUCINEX) 12 hr tablet 600 mg  600 mg Oral BID Fuller Plan A, MD   600 mg at 05/28/18 0847  . HYDROcodone-acetaminophen (NORCO/VICODIN) 5-325 MG per tablet 1-2 tablet  1-2 tablet Oral Q4H PRN Merton Border, MD   1 tablet at 05/28/18 0542  . lidocaine (LIDODERM) 5 % 1 patch  1 patch Transdermal Q24H Fuller Plan A, MD   1 patch at 05/28/18 0849  . losartan (COZAAR) tablet 100 mg  100 mg Oral Daily Merton Border, MD   100 mg at 05/28/18 0846  . ondansetron (ZOFRAN) injection 4 mg  4 mg Intravenous Q6H PRN Black, Karen M, NP      . sodium chloride flush (NS) 0.9 % injection 3 mL  3 mL Intravenous PRN Merton Border, MD         Discharge Medications: Please see discharge summary for a list of discharge medications.  Relevant Imaging Results:  Relevant Lab Results:   Additional Information SS#: [id number]  Candie Chroman, LCSW

## 2018-05-28 NOTE — Progress Notes (Signed)
Physical Therapy Treatment Patient Details Name: ABHIJAY MORRISS MRN: [id number] DOB: 1931-08-15 Today's Date: 05/28/2018    History of Present Illness 85 yo admitted from home after syncopal episode with bil PNA. PMhx: BPH, COPD, CAD, CVA, HLD, HTN, squamous cell carcinoma    PT Comments    RN reports that patient is OK for PT to try for modified session given increased difficulty of respiration/increased O2 needs today. Patient received in bed, easily woken and willing to participate in PT today. More fatigued today and required ModAx2 for functional bed mobility and min guard while sitting at EOB. SpO2 desat and fluctuation between 83-90% on 6LPM per , HR up to 115-120BPM sitting at EOB today. Did not attempt functional transfers or gait due to vitals response and fatigue. Able to sit at EOB for approximately 5 minutes, then returned to supine with ModAx2, also required modAx2 for repositioning in bed. He was left in bed with all needs met this afternoon.    Follow Up Recommendations  SNF;Supervision/Assistance - 24 hour     Equipment Recommendations  None recommended by PT    Recommendations for Other Services       Precautions / Restrictions Precautions Precautions: Fall Precaution Comments: watch sats and HR  Restrictions Weight Bearing Restrictions: No    Mobility  Bed Mobility Overal bed mobility: Needs Assistance Bed Mobility: Supine to Sit;Sit to Supine     Supine to sit: Mod assist;+2 for physical assistance Sit to supine: Mod assist;+2 for physical assistance   General bed mobility comments: patient not feeling well today- required ModAx2 for bed mobility, very fatigued and with labored breathing   Transfers                 General transfer comment: deferred due to HR/O2   Ambulation/Gait             General Gait Details: deferred due to HR/O2    Stairs             Wheelchair Mobility    Modified Rankin (Stroke Patients Only)        Balance Overall balance assessment: Needs assistance Sitting-balance support: Bilateral upper extremity supported;Feet supported Sitting balance-Leahy Scale: Fair Sitting balance - Comments: min guard for safety     Standing balance-Leahy Scale: Poor Standing balance comment: pt required UE assist for stability and balance                             Cognition Arousal/Alertness: Awake/alert Behavior During Therapy: WFL for tasks assessed/performed Overall Cognitive Status: Within Functional Limits for tasks assessed                                        Exercises      General Comments        Pertinent Vitals/Pain Pain Assessment: 0-10 Pain Score: 3  Pain Location: back Pain Descriptors / Indicators: Aching;Constant Pain Intervention(s): Limited activity within patient's tolerance;Monitored during session;Premedicated before session    Home Living                      Prior Function            PT Goals (current goals can now be found in the care plan section) Acute Rehab PT Goals Patient Stated Goal: return home PT Goal Formulation: With  patient Time For Goal Achievement: 06/10/18 Potential to Achieve Goals: Good Progress towards PT goals: Not progressing toward goals - comment(limited by difficulty breathing, HR/O2 )    Frequency    Min 3X/week      PT Plan Current plan remains appropriate    Co-evaluation              AM-PAC PT "6 Clicks" Mobility   Outcome Measure  Help needed turning from your back to your side while in a flat bed without using bedrails?: A Little Help needed moving from lying on your back to sitting on the side of a flat bed without using bedrails?: A Lot Help needed moving to and from a bed to a chair (including a wheelchair)?: A Little Help needed standing up from a chair using your arms (e.g., wheelchair or bedside chair)?: A Little Help needed to walk in hospital room?: A  Lot Help needed climbing 3-5 steps with a railing? : Total 6 Click Score: 14    End of Session Equipment Utilized During Treatment: Oxygen Activity Tolerance: Patient limited by fatigue;Other (comment)(limited by HR/O2 ) Patient left: in bed;with call bell/phone within reach   PT Visit Diagnosis: Other abnormalities of gait and mobility (R26.89);Muscle weakness (generalized) (M62.81);Difficulty in walking, not elsewhere classified (R26.2)     Time: 6599-3570 PT Time Calculation (min) (ACUTE ONLY): 17 min  Charges:  $Therapeutic Activity: 8-22 mins                     Deniece Ree PT, DPT, CBIS  Supplemental Physical Therapist [place]    Pager [phone] Acute Rehab Office [phone]

## 2018-05-28 NOTE — TOC Initial Note (Addendum)
Transition of Care [facility]) - Initial/Assessment Note    Patient Details  Name: [name] MRN: [id number] Date of Birth: 04/12/1931  [facility] of [facility]) CM/SW Contact:    Candie Chroman, LCSW Phone Number: 05/28/2018, 1:54 PM  Clinical Narrative: CSW met with patient. No supports at bedside. Patient verbalized that he was not having a great day but would not say why. CSW introduced role and explained that PT recommendations would be discussed. Patient agreeable to SNF placement. First preference is [facility] because he lives half a mile from there. Sent referral and left message for admissions coordinator to notify. No further concerns. CSW encouraged patient to contact CSW as needed. CSW will continue to follow patient for support and facilitate discharge to SNF once medically stable.     2:45 pm: [facility] is unable to take patients with respiratory issues due to COVID-19 pandemic. Sent referral out to larger [facility] area. Gave patient list of CMS Medicare scores for facilities within 25 miles of his home during initial conversation.       Expected Discharge Plan: Skilled Nursing Facility Barriers to Discharge: Continued Medical Work up   Patient Goals and CMS Choice Patient states their goals for this hospitalization and ongoing recovery are:: "To get therapy and go home." CMS Medicare.gov Compare Post Acute Care list provided to:: Patient    Expected Discharge Plan and Services Expected Discharge Plan: [place]   Discharge Planning Services: NA Post Acute Care Choice: [place] Living arrangements for the past 2 months: Single Family Home                 DME Arranged: N/A DME Agency: NA HH Arranged: NA    Prior Living Arrangements/Services Living arrangements for the past 2 months: Single Family Home Lives with:: Adult Children Patient language and need for interpreter reviewed:: No Do you feel safe going back to the place  where you live?: Yes      Need for Family Participation in Patient Care: Yes (Comment) Care giver support system in place?: Yes (comment)(Plan for short-term SNF. Lives with son and daughter-in-law.)   Criminal Activity/Legal Involvement Pertinent to Current Situation/Hospitalization: No - Comment as needed  Activities of Daily Living Home Assistive Devices/Equipment: None ADL Screening (condition at time of admission) Patient's cognitive ability adequate to safely complete daily activities?: Yes Is the patient deaf or have difficulty hearing?: No Does the patient have difficulty seeing, even when wearing glasses/contacts?: No Does the patient have difficulty concentrating, remembering, or making decisions?: No Patient able to express need for assistance with ADLs?: Yes Does the patient have difficulty dressing or bathing?: No Independently performs ADLs?: Yes (appropriate for developmental age) Does the patient have difficulty walking or climbing stairs?: No Weakness of Legs: None Weakness of Arms/Hands: None  Permission Sought/Granted Permission sought to share information with : Facility Art therapist granted to share information with : Yes, Verbal Permission Granted     Permission granted to share info w AGENCY: [facility]        Emotional Assessment Appearance:: Appears stated age Attitude/Demeanor/Rapport: Engaged, Gracious Affect (typically observed): Accepting, Appropriate, Calm, Pleasant, Depressed Orientation: : Oriented to Self, Oriented to Place, Oriented to  Time, Oriented to Situation Alcohol / Substance Use: Never Used Psych Involvement: No (comment)  Admission diagnosis:  Hypoxia [R09.02] Fall, initial encounter [W19.XXXA] Contusion of back, unspecified laterality, initial encounter [place] acquired pneumonia, unspecified laterality [J18.9] Patient Active Problem List  Diagnosis Date Noted  . Acute respiratory failure  with hypoxia ([place]) 05/27/2018  . Bilateral pneumonia 05/27/2018  . Syncope 05/26/2018  . Thyroid nodule 04/12/2018  . Physical exam 10/08/2014  . Facial skin lesion 04/08/2014  . Elevated WBCs 11/22/2013  . HTN (hypertension) 10/15/2013  . Osteoporosis 10/15/2013  . History of CVA (cerebrovascular accident) 10/15/2013  . History of compression fracture of spine 10/15/2013  . Recurrent pneumonia 10/15/2013  . Hyperlipidemia 10/15/2013  . Low testosterone 10/15/2013  . Carotid stenosis 10/15/2013   PCP:  [name], MD Pharmacy:   [facility] [place] [address] [place] [address] Phone: [phone] Fax: [phone]  [facility], [place] [address] [place] [address] Phone: [phone] Fax: [phone]     Social Determinants of Health (SDOH) Interventions    Readmission Risk Interventions No flowsheet data found.

## 2018-05-28 NOTE — Progress Notes (Signed)
RT Note:  Patient has been de-sating into 80s, even on 3L.  I put patient back on 2L, but had to really coach him into breathing in through nose and blowing out through mouth to even get a sat of 90, this took over 5 minutes of coaching.  Placed patient back on [place] after breathing treatment, but put liter flow on 4 to give better O2 saturation.  Will continue to monitor.

## 2018-05-28 NOTE — Progress Notes (Signed)
Pt's oxygen sat dropped to lower 80's on 4L [place], Pt got breathing treatment an hour ago. Pt resting in bed, not in respiratory distress. Placed on venturi mask @ 6L., O2 sat between 88-90% Jeannette Corpus NP notified, awaiting call back.

## 2018-05-28 NOTE — Progress Notes (Signed)
Patients admission history completed, pt also placed on continuous pulse ox; sats at 92% right now with 6L O2 via nasal canula.

## 2018-05-28 NOTE — Progress Notes (Signed)
PHARMACY - PHYSICIAN COMMUNICATION CRITICAL VALUE ALERT - BLOOD CULTURE IDENTIFICATION (BCID)  [name] is an 85 y.o. male who presented to [facility] on 05/26/2018 with a chief complaint of syncope  Assessment:  Possible PNA  Name of physician (or Provider) Contacted: [facility] ([facility])  Current antibiotics: Ceftriaxone/Azithromycin   Changes to prescribed antibiotics recommended:  Likely contaminant, no changes needed  Results for orders placed or performed during the hospital encounter of 05/26/18  Blood Culture ID Panel (Reflexed) (Collected: 05/26/2018  1:30 PM)  Result Value Ref Range   Enterococcus species NOT DETECTED NOT DETECTED   Listeria monocytogenes NOT DETECTED NOT DETECTED   Staphylococcus species DETECTED (A) NOT DETECTED   Staphylococcus aureus (BCID) NOT DETECTED NOT DETECTED   Methicillin resistance NOT DETECTED NOT DETECTED   Streptococcus species NOT DETECTED NOT DETECTED   Streptococcus agalactiae NOT DETECTED NOT DETECTED   Streptococcus pneumoniae NOT DETECTED NOT DETECTED   Streptococcus pyogenes NOT DETECTED NOT DETECTED   Acinetobacter baumannii NOT DETECTED NOT DETECTED   Enterobacteriaceae species NOT DETECTED NOT DETECTED   Enterobacter cloacae complex NOT DETECTED NOT DETECTED   Escherichia coli NOT DETECTED NOT DETECTED   Klebsiella oxytoca NOT DETECTED NOT DETECTED   Klebsiella pneumoniae NOT DETECTED NOT DETECTED   Proteus species NOT DETECTED NOT DETECTED   Serratia marcescens NOT DETECTED NOT DETECTED   Haemophilus influenzae NOT DETECTED NOT DETECTED   Neisseria meningitidis NOT DETECTED NOT DETECTED   Pseudomonas aeruginosa NOT DETECTED NOT DETECTED   Candida albicans NOT DETECTED NOT DETECTED   Candida glabrata NOT DETECTED NOT DETECTED   Candida krusei NOT DETECTED NOT DETECTED   Candida parapsilosis NOT DETECTED NOT DETECTED   Candida tropicalis NOT DETECTED NOT DETECTED   [name] 05/28/2018  4:44 AM

## 2018-05-29 LAB — BLOOD GAS, ARTERIAL
Acid-base deficit: 2.1 mmol/L — ABNORMAL HIGH (ref 0.0–2.0)
Bicarbonate: 21.4 mmol/L (ref 20.0–28.0)
Drawn by: 535271
O2 Content: 5 L/min
O2 Saturation: 96.2 %
PO2 ART: 82.2 mmHg — AB (ref 83.0–108.0)
Patient temperature: 97.8
pCO2 arterial: 31.2 mmHg — ABNORMAL LOW (ref 32.0–48.0)
pH, Arterial: 7.448 (ref 7.350–7.450)

## 2018-05-29 LAB — EXPECTORATED SPUTUM ASSESSMENT W GRAM STAIN, RFLX TO RESP C

## 2018-05-29 LAB — BASIC METABOLIC PANEL
Anion gap: 10 (ref 5–15)
BUN: 21 mg/dL (ref 8–23)
CALCIUM: 8.7 mg/dL — AB (ref 8.9–10.3)
CO2: 25 mmol/L (ref 22–32)
Chloride: 101 mmol/L (ref 98–111)
Creatinine, Ser: 1.03 mg/dL (ref 0.61–1.24)
GFR calc Af Amer: >60 mL/min (ref >60)
Glucose, Bld: 118 mg/dL — ABNORMAL HIGH (ref 70–99)
Potassium: 4.4 mmol/L (ref 3.5–5.1)
Sodium: 136 mmol/L (ref 135–145)

## 2018-05-29 MED ORDER — METHYLPREDNISOLONE SODIUM SUCC 125 MG IJ SOLR
INTRAMUSCULAR | Status: AC
  Filled 2018-05-29: qty 2

## 2018-05-29 MED ORDER — LEVALBUTEROL HCL 0.63 MG/3ML IN NEBU
0.6300 mg | INHALATION_SOLUTION | Freq: Two times a day (BID) | RESPIRATORY_TRACT | Status: DC
  Administered 2018-05-29 – 2018-05-31 (×4): 0.63 mg via RESPIRATORY_TRACT
  Filled 2018-05-29 (×4): qty 3

## 2018-05-29 MED ORDER — LEVALBUTEROL HCL 0.63 MG/3ML IN NEBU: 0.6300 mg | INHALATION_SOLUTION | Freq: Four times a day (QID) | RESPIRATORY_TRACT | Status: DC

## 2018-05-29 MED ORDER — SODIUM CHLORIDE 0.9 % IV SOLN
500.0000 mg | INTRAVENOUS | Status: AC
  Administered 2018-05-29: 500 mg via INTRAVENOUS
  Filled 2018-05-29: qty 500

## 2018-05-29 MED ORDER — ARFORMOTEROL TARTRATE 15 MCG/2ML IN NEBU
15.0000 ug | INHALATION_SOLUTION | Freq: Two times a day (BID) | RESPIRATORY_TRACT | Status: DC
  Administered 2018-05-29 – 2018-05-31 (×5): 15 ug via RESPIRATORY_TRACT
  Filled 2018-05-29 (×5): qty 2

## 2018-05-29 MED ORDER — ADULT MULTIVITAMIN W/MINERALS CH
1.0000 | ORAL_TABLET | Freq: Every day | ORAL | Status: DC
  Administered 2018-05-30 – 2018-05-31 (×2): 1 via ORAL
  Filled 2018-05-29 (×2): qty 1

## 2018-05-29 MED ORDER — PREDNISONE 20 MG PO TABS
40.0000 mg | ORAL_TABLET | Freq: Every day | ORAL | Status: DC
  Administered 2018-05-30 – 2018-05-31 (×2): 40 mg via ORAL
  Filled 2018-05-29 (×2): qty 2

## 2018-05-29 MED ORDER — SODIUM CHLORIDE 0.9 % IV SOLN
1.0000 g | INTRAVENOUS | Status: AC
  Administered 2018-05-29: 1 g via INTRAVENOUS
  Filled 2018-05-29: qty 10

## 2018-05-29 MED ORDER — METHYLPREDNISOLONE SODIUM SUCC 125 MG IJ SOLR
125.0000 mg | Freq: Once | INTRAMUSCULAR | Status: AC
  Administered 2018-05-29: 125 mg via INTRAVENOUS
  Filled 2018-05-29: qty 2

## 2018-05-29 MED ORDER — ENSURE ENLIVE PO LIQD
237.0000 mL | Freq: Three times a day (TID) | ORAL | Status: DC
  Administered 2018-05-29 – 2018-05-31 (×4): 237 mL via ORAL

## 2018-05-29 NOTE — Discharge Instructions (Signed)

## 2018-05-29 NOTE — Progress Notes (Addendum)
Progress Note    [name]  QQI:[id number] DOB: 1931-11-18  DOA: 05/26/2018 PCP: [name], MD    Brief Narrative:   Chief complaint: Syncope  Medical records reviewed and are as summarized below:  [name] is an 85 y.o. male CAD, CVA, COPD, and BPH; who presented 3/28 from home for evaluation after a possible syncopal episode. He had just returned from [place] and he felt dizzy and fell reporting his legs gave out.   Assessment/Plan:   Principal Problem:   Acute respiratory failure with hypoxia (HCC) Active Problems:   HTN (hypertension)   Hyperlipidemia   Syncope   Bilateral pneumonia  1. Acute respiratory failure with hypoxia secondary to bilateral pneumonia, suspected COPD: Patient with oxygen saturation 88% on room air at admission this am greater than 90% with 3L [place]. Not on oxygen at home and had not been on any inhalers. Troponin negative. CT chest negative for PE, but concerning for bilateral edema or pneumonia.  Respiratory virus panel negative. No significant risk factors for COVID-19. O2 saturations acutely dropped into the lower 80's early in a.m. on 3/30, requiring patient to be placed on Ventimask.  On physical exam on 3/30, patient appears to have crackles concerning for fluid has been on normal saline IV fluids since admission.  IV fluids were stopped and he was given 40 mg of Lasix for the possibility of fluid overload.  However repeat chest x-ray showed significant atelectasis.  Patient still requiring 6 L of nasal cannula oxygen.   -Continue supplemental oxygen  -Wean oxygen to room air as able -Follow blood cultures  -Sputum culture as able -Continue scheduled nebs  -Rocephin and azithromycin IV, then switch to p.o. tomorrow -Give Solu-Medrol IV 125 mg, then start prednisone 40 mg daily -Checking ABG (appears compensated with pH 7.448, PCO2 31, PO2 82.2)  2. Syncope, PVCs. Patient reports "legs give out". Likely related to #2. No  prior reports of coughing, nausea, or vomiting prior to arrival.  Echo revealing EF of 60 to 65% with left ventricular diastolic function not able to be evaluated.  Telemetry overnight with frequent PVCs during hospitalization. -Physical therapy to eval and treat  -May benefit from Holter monitor at discharge   3.  Leukocytosis: Suspect secondary to above.  4. HTN. Controlled. Home meds include cozaar -Continue Cozaar  5. Back pain. Pt with hx chronic back pain. Complains that pain is worse since fall/syncope. Xray and CT chest without acute abnormality as to cause of pain.  He does report that it is improving. -Mobilize with physical therapy -Continue lidocaine patch for pain management  6.  Remote history of tobacco abuse: Patient reports smoking for approximately 40 years anywhere from 1 to 2 packs/day of cigarettes, but quit in 1990s.  6.  Positive blood culture: Patient had positive admission aerobic blood culture, but repeat negative x3 days suspecting likely contaminant.  Body mass index is 17.93 kg/m.   Family Communication/Anticipated D/C date and plan/Code Status   DVT prophylaxis: Lovenox ordered. Code Status: Full Code.  Family Communication: No family present at bedside Disposition Plan: Possible discharge home in 2 to 3 days   Medical Consultants:    Pulmonary critical care   Anti-Infectives:    Rocephin and azithromycin day 4   Subjective:   Overnight patient reports that breathing was a little bit better and has been able to cough up phlegm.  Objective:    Vitals:   05/29/18 9417 05/29/18 4081 05/29/18 1015  05/29/18 1021  BP:    120/84  Pulse:    99  Resp:      Temp:      TempSrc:      SpO2:  94% 92%   Weight: 60 kg     Height:        Intake/Output Summary (Last 24 hours) at 05/29/2018 1057 Last data filed at 05/29/2018 1000 Gross per 24 hour  Intake 990 ml  Output 340 ml  Net 650 ml   Filed Weights   05/28/18 0043 05/28/18 0048  05/29/18 0457  Weight: 68.9 kg 65.8 kg 60 kg    Exam: Constitutional: Elderly male who appears acutely ill NAD, calm, comfortable Eyes: PERRL, lids and conjunctivae normal ENMT: Mucous membranes are moist. Posterior pharynx clear of any exudate or lesions.  Neck: normal, supple, no masses, no thyromegaly Respiratory: Decreased overall aeration with patient on 6 L nasal cannula oxygen at this time with O2 saturations 92%.  Patient's breathing does not appear to be labored and talking in more complete sentences at this time. Cardiovascular: Regular rate and rhythm, no murmurs / rubs / gallops. No extremity edema. 2+ pedal pulses. No carotid bruits.  Abdomen: no tenderness, no masses palpated. No hepatosplenomegaly. Bowel sounds positive.  Musculoskeletal: no clubbing / cyanosis. No joint deformity upper and lower extremities. Good ROM, no contractures. Normal muscle tone.  Skin: no rashes, lesions, ulcers. No induration Neurologic: CN 2-12 grossly intact. Sensation intact, DTR normal. Strength 5/5 in all 4.  Psychiatric: Normal judgment and insight. Alert and oriented x 3. Normal mood.    Data Reviewed:   I have personally reviewed following labs and imaging studies:  Labs: Labs show the following:   Basic Metabolic Panel: Recent Labs  Lab 05/26/18 1015 05/27/18 0709 05/28/18 0500 05/29/18 0517  NA 135  --  136 136  K 3.8  --  4.0 4.4  CL 104  --  104 101  CO2 22  --  22 25  GLUCOSE 133*  --  141* 118*  BUN 17  --  11 21  CREATININE 0.75  --  0.70 1.03  CALCIUM 8.9  --  8.5* 8.7*  MG  --  1.8  --   --    GFR Estimated Creatinine Clearance: 45.3 mL/min (by C-G formula based on SCr of 1.03 mg/dL). Liver Function Tests: Recent Labs  Lab 05/26/18 1015  AST 30  ALT 43  ALKPHOS 69  BILITOT 1.5*  PROT 5.7*  ALBUMIN 3.5   No results for input(s): LIPASE, AMYLASE in the last 168 hours. No results for input(s): AMMONIA in the last 168 hours. Coagulation profile No  results for input(s): INR, PROTIME in the last 168 hours.  CBC: Recent Labs  Lab 05/26/18 1015 05/28/18 0500  WBC 15.6* 12.4*  NEUTROABS 12.3*  --   HGB 15.5 14.4  HCT 46.6 43.8  MCV 92.6 90.9  PLT 227 177   Cardiac Enzymes: Recent Labs  Lab 05/26/18 1015 05/26/18 1933 05/27/18 0051 05/27/18 0709  TROPONINI <0.03 <0.03 <0.03 <0.03   BNP (last 3 results) No results for input(s): PROBNP in the last 8760 hours. CBG: No results for input(s): GLUCAP in the last 168 hours. D-Dimer: No results for input(s): DDIMER in the last 72 hours. Hgb A1c: No results for input(s): HGBA1C in the last 72 hours. Lipid Profile: No results for input(s): CHOL, HDL, LDLCALC, TRIG, CHOLHDL, LDLDIRECT in the last 72 hours. Thyroid function studies: Recent Labs    05/27/18  1206  TSH 2.109   Anemia work up: No results for input(s): VITAMINB12, FOLATE, FERRITIN, TIBC, IRON, RETICCTPCT in the last 72 hours. Sepsis Labs: Recent Labs  Lab 05/26/18 1015 05/26/18 1330 05/28/18 0500  WBC 15.6*  --  12.4*  LATICACIDVEN  --  1.4  --     Microbiology Recent Results (from the past 240 hour(s))  Culture, blood (routine x 2)     Status: Abnormal (Preliminary result)   Collection Time: 05/26/18  1:30 PM  Result Value Ref Range Status   Specimen Description BLOOD RIGHT ANTECUBITAL  Final   Special Requests   Final    BOTTLES DRAWN AEROBIC AND ANAEROBIC Blood Culture adequate volume   Culture  Setup Time   Final    GRAM POSITIVE COCCI IN CLUSTERS AEROBIC BOTTLE ONLY CRITICAL RESULT CALLED TO, READ BACK BY AND VERIFIED WITH: Serita Grammes [id number] 05/28/2018 Mena Goes Performed at [facility], [address] [place], [place] [address]    Culture STAPHYLOCOCCUS SPECIES (COAGULASE NEGATIVE) (A)  Final   Report Status PENDING  Incomplete  Blood Culture ID Panel (Reflexed)     Status: Abnormal   Collection Time: 05/26/18  1:30 PM  Result Value Ref Range Status   Enterococcus species NOT  DETECTED NOT DETECTED Final   Listeria monocytogenes NOT DETECTED NOT DETECTED Final   Staphylococcus species DETECTED (A) NOT DETECTED Final    Comment: Methicillin (oxacillin) susceptible coagulase negative staphylococcus. Possible blood culture contaminant (unless isolated from more than one blood culture draw or clinical case suggests pathogenicity). No antibiotic treatment is indicated for blood  culture contaminants. CRITICAL RESULT CALLED TO, READ BACK BY AND VERIFIED WITH: J. LEDFORD,PHARMD [id number] 05/28/2018 T. TYSOR    Staphylococcus aureus (BCID) NOT DETECTED NOT DETECTED Final   Methicillin resistance NOT DETECTED NOT DETECTED Final   Streptococcus species NOT DETECTED NOT DETECTED Final   Streptococcus agalactiae NOT DETECTED NOT DETECTED Final   Streptococcus pneumoniae NOT DETECTED NOT DETECTED Final   Streptococcus pyogenes NOT DETECTED NOT DETECTED Final   Acinetobacter baumannii NOT DETECTED NOT DETECTED Final   Enterobacteriaceae species NOT DETECTED NOT DETECTED Final   Enterobacter cloacae complex NOT DETECTED NOT DETECTED Final   Escherichia coli NOT DETECTED NOT DETECTED Final   Klebsiella oxytoca NOT DETECTED NOT DETECTED Final   Klebsiella pneumoniae NOT DETECTED NOT DETECTED Final   Proteus species NOT DETECTED NOT DETECTED Final   Serratia marcescens NOT DETECTED NOT DETECTED Final   Haemophilus influenzae NOT DETECTED NOT DETECTED Final   Neisseria meningitidis NOT DETECTED NOT DETECTED Final   Pseudomonas aeruginosa NOT DETECTED NOT DETECTED Final   Candida albicans NOT DETECTED NOT DETECTED Final   Candida glabrata NOT DETECTED NOT DETECTED Final   Candida krusei NOT DETECTED NOT DETECTED Final   Candida parapsilosis NOT DETECTED NOT DETECTED Final   Candida tropicalis NOT DETECTED NOT DETECTED Final    Comment: Performed at [facility], [address] [place], [place] [address]  Culture, blood (routine x 2)     Status: None (Preliminary result)    Collection Time: 05/26/18  1:36 PM  Result Value Ref Range Status   Specimen Description BLOOD LEFT FOREARM  Final   Special Requests   Final    BOTTLES DRAWN AEROBIC AND ANAEROBIC Blood Culture adequate volume   Culture   Final    NO GROWTH 3 DAYS Performed at [facility], [place] [address] [place], [place] [address]    Report Status  PENDING  Incomplete  Respiratory Panel by PCR     Status: None   Collection Time: 05/27/18 12:18 PM  Result Value Ref Range Status   Adenovirus NOT DETECTED NOT DETECTED Final   Coronavirus 229E NOT DETECTED NOT DETECTED Final    Comment: (NOTE) The Coronavirus on the Respiratory Panel, DOES NOT test for the novel  Coronavirus (2019 nCoV)    Coronavirus HKU1 NOT DETECTED NOT DETECTED Final   Coronavirus NL63 NOT DETECTED NOT DETECTED Final   Coronavirus OC43 NOT DETECTED NOT DETECTED Final   Metapneumovirus NOT DETECTED NOT DETECTED Final   Rhinovirus / Enterovirus NOT DETECTED NOT DETECTED Final   Influenza A NOT DETECTED NOT DETECTED Final   Influenza B NOT DETECTED NOT DETECTED Final   Parainfluenza Virus 1 NOT DETECTED NOT DETECTED Final   Parainfluenza Virus 2 NOT DETECTED NOT DETECTED Final   Parainfluenza Virus 3 NOT DETECTED NOT DETECTED Final   Parainfluenza Virus 4 NOT DETECTED NOT DETECTED Final   Respiratory Syncytial Virus NOT DETECTED NOT DETECTED Final   Bordetella pertussis NOT DETECTED NOT DETECTED Final   Chlamydophila pneumoniae NOT DETECTED NOT DETECTED Final   Mycoplasma pneumoniae NOT DETECTED NOT DETECTED Final    Comment: Performed at [facility], [address] [place], [place] [address]    Procedures and diagnostic studies:  Dg Chest 2 View  Result Date: 05/28/2018 CLINICAL DATA:  Recent fall with hypoxia EXAM: CHEST - 2 VIEW COMPARISON:  05/26/2018 FINDINGS: Cardiac shadow is within normal limits. Aortic calcifications are again seen. The overall inspiratory effort is poor. Mild bibasilar atelectatic  changes are noted similar to that seen on prior CT examination. No focal confluent infiltrate or sizable effusion is seen. IMPRESSION: Bibasilar atelectatic changes stable from the previous exam. No new focal abnormality is seen. Electronically Signed   By: Inez Catalina M.D.   On: 05/28/2018 08:18    Medications:   . albuterol  2.5 mg Nebulization Q6H  . arformoterol  15 mcg Nebulization BID  . aspirin EC  81 mg Oral Daily  . atorvastatin  20 mg Oral Daily  . budesonide  0.25 mg Inhalation BID  . enoxaparin (LOVENOX) injection  40 mg Subcutaneous Q24H  . guaiFENesin  600 mg Oral BID  . lidocaine  1 patch Transdermal Q24H  . losartan  100 mg Oral Daily  . [START ON 05/30/2018] predniSONE  40 mg Oral Q breakfast   Continuous Infusions: . sodium chloride 500 mL (05/27/18 1448)  . azithromycin    . cefTRIAXone (ROCEPHIN)  IV       LOS: 2 days   Rondell A Smith  [facility]   *Please refer to [email or web address], password [email or web address] to get updated schedule on who will round on this patient, as hospitalists switch teams weekly. If 7PM-7AM, please contact night-coverage at [email or web address], password [email or web address] for any overnight needs.

## 2018-05-29 NOTE — TOC Progression Note (Addendum)
Transition of Care [facility]) - Progression Note    Patient Details  Name: CREE KUNERT MRN: [id number] Date of Birth: [birth year]  Transition of Care [facility]) CM/SW [place], LCSW Phone Number: 05/29/2018, 9:03 AM  Clinical Narrative: Provided patient with list of bed offers so far. He asked CSW to call his son or daughter-in-law to see if they had any preferences. CSW called and spoke with daughter-in-law. [facility] preference. CSW sent referral and notified admissions coordinator. CSW instructed daughter-in-law on how to access CMS Medicare scores online.  1:37 pm: [facility] is able to offer a bed. Patient and daughter-in-law aware and have accepted.  Expected Discharge Plan: [place] Barriers to Discharge: Continued Medical Work up  Expected Discharge Plan and Services Expected Discharge Plan: [place]   Discharge Planning Services: NA Post Acute Care Choice: [place] Living arrangements for the past 2 months: Single Family Home                 DME Arranged: N/A DME Agency: NA HH Arranged: NA     Social Determinants of Health (SDOH) Interventions    Readmission Risk Interventions No flowsheet data found.

## 2018-05-29 NOTE — Progress Notes (Signed)
Initial Nutrition Assessment  RD working remotely.  DOCUMENTATION CODES:   Underweight  INTERVENTION:   -Ensure Enlive po TID, each supplement provides 350 kcal and 20 grams of protein -MVI with minerals daily  NUTRITION DIAGNOSIS:   Increased nutrient needs related to chronic illness(COPD) as evidenced by estimated needs.  GOAL:   Patient will meet greater than or equal to 90% of their needs  MONITOR:   PO intake, Supplement acceptance, Labs, Weight trends, Skin, I & O's  REASON FOR ASSESSMENT:   Other (Comment)    ASSESSMENT:   [name]  is a 85 y.o. male, with past medical history significant for CAD, CVA, COPD and BPH presenting from assisted living facility for evaluation after a syncopal episode that occurred for 2 seconds today after he finished breakfast.  No preceding chest pain or palpitations.  Patient injured the middle part of his lumbar spine with trauma to the ribs as well.  Patient denies any fever, chills, shortness of breath, cough, nausea, vomiting or diarrhea.  In the emergency room the patient was found to be hypoxemic with 88% oxygen on room air and his chest CT showed bilateral upper lobes changes suggestive of pneumonia.  Patient had leukocytosis as well.  Patient looks clinically well.  Pt admitted with syncopal episode and acute respiratory failure with hypoxia likely related to bilateral pneumonia.  Reviewed I/O's: +290 ml x 24 hours  UOP: 340 ml x 24 hours  Spoke with pt on the phone, who was very pleasant and in good spirits today. He reports that he appetite has been poor over the past few days, due to feeling poorly. Pt shares he ate "nothing" over the past 3-4 days. Prior to this, pt reports good appetite and in his usual state of health. Typical intake is 3 meals per day (Breakfast: ham biscuit OR sausage biscuit and grits; Lunch: sandwich; Dinner: meat, starch, and vegetable OR pizza provided by son). Pt also reports he consumes one Ensure  supplement and a banana daily.   Pt reports his appetite is still poor; he consumed a few bites of bacon and potatoes this morning. Noted meal completion has declined since admission; PO: 25-100%.   Pt shares his UBW is around 145#. He reports his weight was stable until a few days ago. However, suspect that wt loss has been more chronic. Noted pt has experienced a 9% wt loss over the past 6 months, which is significant for time frame.   Discussed with pt importance of good meal and supplement intake to promote healing. Pt amenable to Ensure supplements.   Suspect pt with malnutrition (given decreased oral intake, wt hx, and underweight status), however, unable to identify at this time.   Medications reviewed and include prednisone and solu-medrol.  Labs reviewed.   NUTRITION - FOCUSED PHYSICAL EXAM:    Most Recent Value  Orbital Region  Unable to assess  Upper Arm Region  Unable to assess  Thoracic and Lumbar Region  Unable to assess  Buccal Region  Unable to assess  Temple Region  Unable to assess  Clavicle Bone Region  Unable to assess  Clavicle and Acromion Bone Region  Unable to assess  Scapular Bone Region  Unable to assess  Dorsal Hand  Unable to assess  Patellar Region  Unable to assess  Anterior Thigh Region  Unable to assess  Posterior Calf Region  Unable to assess  Edema (RD Assessment)  Unable to assess  Hair  Unable to assess  Eyes  Unable to assess  Mouth  Unable to assess  Skin  Unable to assess  Nails  Unable to assess       Diet Order:   Diet Order            Diet regular Room service appropriate? Yes; Fluid consistency: Thin  Diet effective now              EDUCATION NEEDS:   Education needs have been addressed  Skin:  Skin Assessment: Skin Integrity Issues: Skin Integrity Issues:: Other (Comment) Other: rt arm skin tear  Last BM:  05/26/18  Height:   Ht Readings from Last 1 Encounters:  05/27/18 6' (1.829 m)    Weight:   Wt Readings  from Last 1 Encounters:  05/29/18 60 kg    Ideal Body Weight:  80.9 kg  BMI:  Body mass index is 17.93 kg/m.  Estimated Nutritional Needs:   Kcal:  1700-1900  Protein:  85-100 grams  Fluid:  1.7-1.9 L    [name] A. Jimmye Norman, RD, LDN, [place] Registered Dietitian II Certified Diabetes Care and Education Specialist Pager: [phone] After hours Pager: [phone]

## 2018-05-29 NOTE — Plan of Care (Signed)
  Problem: Education: Goal: Knowledge of disease or condition will improve Outcome: Progressing   Problem: Activity: Goal: Ability to tolerate increased activity will improve Outcome: Progressing Goal: Will verbalize the importance of balancing activity with adequate rest periods Outcome: Progressing   Problem: Respiratory: Goal: Ability to maintain a clear airway will improve Outcome: Progressing Goal: Levels of oxygenation will improve Outcome: Progressing Goal: Ability to maintain adequate ventilation will improve Outcome: Progressing   Problem: Education: Goal: Knowledge of General Education information will improve Description Including pain rating scale, medication(s)/side effects and non-pharmacologic comfort measures Outcome: Progressing   Problem: Pain Managment: Goal: General experience of comfort will improve Outcome: Progressing   Problem: Safety: Goal: Ability to remain free from injury will improve Outcome: Progressing   Problem: Skin Integrity: Goal: Risk for impaired skin integrity will decrease Outcome: Progressing   Problem: Activity: Goal: Ability to tolerate increased activity will improve Outcome: Progressing   Problem: Clinical Measurements: Goal: Ability to maintain a body temperature in the normal range will improve Outcome: Progressing   Problem: Respiratory: Goal: Ability to maintain adequate ventilation will improve Outcome: Progressing Goal: Ability to maintain a clear airway will improve Outcome: Progressing

## 2018-05-30 DIAGNOSIS — I159 Secondary hypertension, unspecified: Secondary | ICD-10-CM

## 2018-05-30 DIAGNOSIS — W19XXXA Unspecified fall, initial encounter: Secondary | ICD-10-CM

## 2018-05-30 DIAGNOSIS — E785 Hyperlipidemia, unspecified: Secondary | ICD-10-CM

## 2018-05-30 DIAGNOSIS — S20229A Contusion of unspecified back wall of thorax, initial encounter: Secondary | ICD-10-CM

## 2018-05-30 DIAGNOSIS — R339 Retention of urine, unspecified: Secondary | ICD-10-CM

## 2018-05-30 LAB — CULTURE, BLOOD (ROUTINE X 2): Special Requests: ADEQUATE

## 2018-05-30 MED ORDER — AZITHROMYCIN 250 MG PO TABS
250.0000 mg | ORAL_TABLET | Freq: Every day | ORAL | Status: DC
  Administered 2018-05-30 – 2018-05-31 (×2): 250 mg via ORAL
  Filled 2018-05-30 (×2): qty 1

## 2018-05-30 MED ORDER — CEFDINIR 300 MG PO CAPS
300.0000 mg | ORAL_CAPSULE | Freq: Two times a day (BID) | ORAL | Status: DC
  Administered 2018-05-30 – 2018-05-31 (×3): 300 mg via ORAL
  Filled 2018-05-30 (×3): qty 1

## 2018-05-30 MED ORDER — TAMSULOSIN HCL 0.4 MG PO CAPS
0.4000 mg | ORAL_CAPSULE | Freq: Every day | ORAL | Status: DC
  Administered 2018-05-30 – 2018-05-31 (×2): 0.4 mg via ORAL
  Filled 2018-05-30 (×2): qty 1

## 2018-05-30 NOTE — Progress Notes (Addendum)
No output since this morning, bladder scan done= 325 ml. NP Black notified, will do I and O  this time ( second time), if patient contines having retention will place [place] if needed after third I and O.   Will continue to monitor.  Marjorie Deprey, RN

## 2018-05-30 NOTE — Progress Notes (Signed)
Physical Therapy Treatment Patient Details Name: [name] MRN: [id number] DOB: 1931-07-08 Today's Date: 05/30/2018    History of Present Illness Pt is an 85 y.o. male admitted 05/26/18 after syncopal episode. Found to have bilateral PNA, suspected COPD. C/o chronic back pain worst s/p fall; imaging negative for acute abnormality. PMH includes COPD, CAD, CVA, HTN, squamous cell carcinoma.   PT Comments    Pt slowly progressing with mobility. Able to work on generalized strengthening with repeated sit<>stands and standing pre-gait activity. Pt requiring multiple rest breaks due to tachypnea at rest and with mobility. SpO2 down to 83% on 2L O2 [place]; returned to 92% with prolonged seated rest and cues for deep breathing. Pt remains motivated to participate as able.   BPs Sitting 139/85  Standing 109/82  Standing after 2 min 140/89  Return to sit 148/82      Follow Up Recommendations  SNF;Supervision/Assistance - 24 hour     Equipment Recommendations  None recommended by PT    Recommendations for Other Services       Precautions / Restrictions Precautions Precautions: Fall Precaution Comments: Watch SpO2 and HR; very tachypneic Restrictions Weight Bearing Restrictions: No    Mobility  Bed Mobility               General bed mobility comments: Received sitting in recliner  Transfers Overall transfer level: Needs assistance   Transfers: Sit to/from Stand Sit to Stand: Min assist;Min guard         General transfer comment: Performed multiple sit<>stands from recliner during session. Pt with heavy reliance on BUE support to push from armrests; initial minA to stand, progressing to min guard; increased time and effort. Cues to not hold breath while standing as pt very tachypneic upon standing  Ambulation/Gait Ambulation/Gait assistance: Min assist   Assistive device: Rolling walker (2 wheeled)       General Gait Details: Initiated gait training by marching in  place. Pt only able to tolerate ~5-10 sec bouts with RW and minA before needing seated rest due to SOB and decreased SpO2   Stairs             Wheelchair Mobility    Modified Rankin (Stroke Patients Only)       Balance Overall balance assessment: Needs assistance Sitting-balance support: Bilateral upper extremity supported;Feet supported Sitting balance-Leahy Scale: Fair       Standing balance-Leahy Scale: Poor Standing balance comment: Reliant on UE support                            Cognition Arousal/Alertness: Awake/alert Behavior During Therapy: WFL for tasks assessed/performed Overall Cognitive Status: Within Functional Limits for tasks assessed                                 General Comments: HOH      Exercises      General Comments        Pertinent Vitals/Pain Pain Assessment: Faces Faces Pain Scale: Hurts a little bit Pain Location: back Pain Descriptors / Indicators: Aching;Constant Pain Intervention(s): Limited activity within patient's tolerance;Repositioned;Premedicated before session    Home Living                      Prior Function            PT Goals (current goals can now be found in  the care plan section) Acute Rehab PT Goals Patient Stated Goal: Get stronger and return home PT Goal Formulation: With patient Time For Goal Achievement: 06/10/18 Potential to Achieve Goals: Good Progress towards PT goals: Progressing toward goals    Frequency    Min 3X/week      PT Plan Current plan remains appropriate    Co-evaluation              AM-PAC PT "6 Clicks" Mobility   Outcome Measure  Help needed turning from your back to your side while in a flat bed without using bedrails?: A Little Help needed moving from lying on your back to sitting on the side of a flat bed without using bedrails?: A Little Help needed moving to and from a bed to a chair (including a wheelchair)?: A Little Help  needed standing up from a chair using your arms (e.g., wheelchair or bedside chair)?: A Little Help needed to walk in hospital room?: A Lot Help needed climbing 3-5 steps with a railing? : Total 6 Click Score: 15    End of Session Equipment Utilized During Treatment: Gait belt;Oxygen Activity Tolerance: Patient limited by fatigue;Treatment limited secondary to medical complications (Comment) Patient left: in chair;with call bell/phone within reach;with chair alarm set Nurse Communication: Mobility status PT Visit Diagnosis: Other abnormalities of gait and mobility (R26.89);Muscle weakness (generalized) (M62.81);Difficulty in walking, not elsewhere classified (R26.2)     Time: 5462-7035 PT Time Calculation (min) (ACUTE ONLY): 21 min  Charges:  $Therapeutic Exercise: 8-22 mins                    Mabeline Caras, PT, DPT Acute Rehabilitation Services  Pager [phone] Office [place] 05/30/2018, 10:43 AM

## 2018-05-30 NOTE — Progress Notes (Signed)
Pt is complaining of back pain. RN gave medications. Pt is breathing heavy. BP is 161/94. MD notified. Will continue to monitor pt

## 2018-05-30 NOTE — Progress Notes (Signed)
[facility] PROGRESS NOTE  [name] DQQ:[id number] DOB: 04/05/1931 DOA: 05/26/2018 PCP: [name], MD  Assessment/Plan:  1. Acute respiratory failure with hypoxia secondary to bilateral pneumonia, suspected COPD: Patient with oxygen saturation 88% on room air at admission this am greater than 90% with 2L [place] and intense coaching on slowing breathing down.  Not on oxygen at home and had not been on any inhalers. Troponin negative. CT chest negative for PE, but concerning for bilateral edema or pneumonia.  Respiratory virus panel negative. No significant risk factors for COVID-19. O2 saturations acutely dropped into the lower 80's early in a.m. on 3/30, requiring patient to be placed on Ventimask.  On physical exam on 3/30, patient appeared to have crackles concerning for fluid.  IV fluids were stopped and he was given 40 mg of Lasix for the possibility of fluid overload. Diuresed 1L. He also received solumedrol.  However repeat chest x-ray showed significant atelectasis and pt developed urinary retention.     --Wean oxygen to room air as able -coach to slow breathing down -incentive spirometry -Sputum culture as able -Continue scheduled nebs  -Rocephin and azithromycin IV, switched to p.o.  -Given Solu-Medrol IV 125 mg, and start prednisone 40 mg 05/30/18 - ABG 05/29/18 ( pH 7.448, PCO2 31, PO2 82.2)  2. Syncope, PVCs. Patient reports "legs give out". Likely related to #2. No prior reports of coughing, nausea, or vomiting prior to arrival.  Echo revealing EF of 60 to 65% with left ventricular diastolic function not able to be evaluated.  Telemetry showed frequent PVCs initially but decreased this am.  -Physical therapy to eval and treat  -May benefit from Holter monitor at discharge   3.  Leukocytosis: Suspect secondary to above and in setting of steroids. Afebrile and non-toxic appearing  4. HTN. Fair control. Home meds include cozaar -Continue Cozaar  5. Back pain. Pt  with hx chronic back pain. Complains that pain is worse since fall/syncope. Xray and CT chest without acute abnormality as to cause of pain.  He does report that it is improving. -Mobilize with physical therapy -Continue lidocaine patch for pain management  6.  Remote history of tobacco abuse: Patient reports smoking for approximately 40 years anywhere from 1 to 2 packs/day of cigarettes, but quit in 1990s.  6.  Positive blood culture: Patient had positive admission aerobic blood culture, but repeat negative x3 days suspecting likely contaminant.  7. Urinary retention. Patient reports remote hx of same.  -[place] -flomax -may need to be discharged with [place] with OP follow up urology  Code Status: full Family Communication: none present Disposition Plan: facility hopefully tomorrow   Consultants:    Procedures:    Antibiotics:  Rocephin 3/28-3/31  Azithromycin 3/28>>>  Cefdinir 4/1>>  HPI/Subjective: Sitting up in bed with rapid respers. Reports feeling better but having usual back pain. Continues to resist mobilization  Objective: Vitals:   05/30/18 0751 05/30/18 0835  BP:    Pulse:  94  Resp:  18  Temp:    SpO2: 94% 92%    Intake/Output Summary (Last 24 hours) at 05/30/2018 0926 Last data filed at 05/30/2018 0809 Gross per 24 hour  Intake 580 ml  Output 976 ml  Net -396 ml   Filed Weights   05/28/18 0048 05/29/18 0457 05/30/18 0500  Weight: 65.8 kg 60 kg 63 kg    Exam:   General:  Awake alert in no acute distress  Cardiovascular: rrr no mgr no LE edema  Respiratory:  shallow rapid with course BS. No crackles  Abdomen: non-distended non-tender +BS  Musculoskeletal: joints without swelling/erythema   Data Reviewed: Basic Metabolic Panel: Recent Labs  Lab 05/26/18 1015 05/27/18 0709 05/28/18 0500 05/29/18 0517  NA 135  --  136 136  K 3.8  --  4.0 4.4  CL 104  --  104 101  CO2 22  --  22 25  GLUCOSE 133*  --  141* 118*  BUN 17  --  11 21   CREATININE 0.75  --  0.70 1.03  CALCIUM 8.9  --  8.5* 8.7*  MG  --  1.8  --   --    Liver Function Tests: Recent Labs  Lab 05/26/18 1015  AST 30  ALT 43  ALKPHOS 69  BILITOT 1.5*  PROT 5.7*  ALBUMIN 3.5   No results for input(s): LIPASE, AMYLASE in the last 168 hours. No results for input(s): AMMONIA in the last 168 hours. CBC: Recent Labs  Lab 05/26/18 1015 05/28/18 0500  WBC 15.6* 12.4*  NEUTROABS 12.3*  --   HGB 15.5 14.4  HCT 46.6 43.8  MCV 92.6 90.9  PLT 227 177   Cardiac Enzymes: Recent Labs  Lab 05/26/18 1015 05/26/18 1933 05/27/18 0051 05/27/18 0709  TROPONINI <0.03 <0.03 <0.03 <0.03   BNP (last 3 results) Recent Labs    05/26/18 1015  BNP 27.5    ProBNP (last 3 results) No results for input(s): PROBNP in the last 8760 hours.  CBG: No results for input(s): GLUCAP in the last 168 hours.  Recent Results (from the past 240 hour(s))  Culture, blood (routine x 2)     Status: Abnormal (Preliminary result)   Collection Time: 05/26/18  1:30 PM  Result Value Ref Range Status   Specimen Description BLOOD RIGHT ANTECUBITAL  Final   Special Requests   Final    BOTTLES DRAWN AEROBIC AND ANAEROBIC Blood Culture adequate volume   Culture  Setup Time   Final    GRAM POSITIVE COCCI IN CLUSTERS AEROBIC BOTTLE ONLY CRITICAL RESULT CALLED TO, READ BACK BY AND VERIFIED WITH: Serita Grammes [id number] 05/28/2018 Mena Goes Performed at [facility], [place] [address] [place], [place] [address]    Culture STAPHYLOCOCCUS SPECIES (COAGULASE NEGATIVE) (A)  Final   Report Status PENDING  Incomplete  Blood Culture ID Panel (Reflexed)     Status: Abnormal   Collection Time: 05/26/18  1:30 PM  Result Value Ref Range Status   Enterococcus species NOT DETECTED NOT DETECTED Final   Listeria monocytogenes NOT DETECTED NOT DETECTED Final   Staphylococcus species DETECTED (A) NOT DETECTED Final    Comment: Methicillin (oxacillin) susceptible coagulase negative  staphylococcus. Possible blood culture contaminant (unless isolated from more than one blood culture draw or clinical case suggests pathogenicity). No antibiotic treatment is indicated for blood  culture contaminants. CRITICAL RESULT CALLED TO, READ BACK BY AND VERIFIED WITH: J. LEDFORD,PHARMD [id number] 05/28/2018 T. TYSOR    Staphylococcus aureus (BCID) NOT DETECTED NOT DETECTED Final   Methicillin resistance NOT DETECTED NOT DETECTED Final   Streptococcus species NOT DETECTED NOT DETECTED Final   Streptococcus agalactiae NOT DETECTED NOT DETECTED Final   Streptococcus pneumoniae NOT DETECTED NOT DETECTED Final   Streptococcus pyogenes NOT DETECTED NOT DETECTED Final   Acinetobacter baumannii NOT DETECTED NOT DETECTED Final   Enterobacteriaceae species NOT DETECTED NOT DETECTED Final   Enterobacter cloacae complex NOT DETECTED NOT DETECTED Final   Escherichia coli NOT DETECTED NOT DETECTED Final  Klebsiella oxytoca NOT DETECTED NOT DETECTED Final   Klebsiella pneumoniae NOT DETECTED NOT DETECTED Final   Proteus species NOT DETECTED NOT DETECTED Final   Serratia marcescens NOT DETECTED NOT DETECTED Final   Haemophilus influenzae NOT DETECTED NOT DETECTED Final   Neisseria meningitidis NOT DETECTED NOT DETECTED Final   Pseudomonas aeruginosa NOT DETECTED NOT DETECTED Final   Candida albicans NOT DETECTED NOT DETECTED Final   Candida glabrata NOT DETECTED NOT DETECTED Final   Candida krusei NOT DETECTED NOT DETECTED Final   Candida parapsilosis NOT DETECTED NOT DETECTED Final   Candida tropicalis NOT DETECTED NOT DETECTED Final    Comment: Performed at [facility], [place] [address] [place], [place] [address]  Culture, blood (routine x 2)     Status: None (Preliminary result)   Collection Time: 05/26/18  1:36 PM  Result Value Ref Range Status   Specimen Description BLOOD LEFT FOREARM  Final   Special Requests   Final    BOTTLES DRAWN AEROBIC AND ANAEROBIC Blood Culture adequate volume    Culture   Final    NO GROWTH 3 DAYS Performed at [facility], [place] [address], [place], [place] [address]    Report Status PENDING  Incomplete  Respiratory Panel by PCR     Status: None   Collection Time: 05/27/18 12:18 PM  Result Value Ref Range Status   Adenovirus NOT DETECTED NOT DETECTED Final   Coronavirus 229E NOT DETECTED NOT DETECTED Final    Comment: (NOTE) The Coronavirus on the Respiratory Panel, DOES NOT test for the novel  Coronavirus (2019 nCoV)    Coronavirus HKU1 NOT DETECTED NOT DETECTED Final   Coronavirus NL63 NOT DETECTED NOT DETECTED Final   Coronavirus OC43 NOT DETECTED NOT DETECTED Final   Metapneumovirus NOT DETECTED NOT DETECTED Final   Rhinovirus / Enterovirus NOT DETECTED NOT DETECTED Final   Influenza A NOT DETECTED NOT DETECTED Final   Influenza B NOT DETECTED NOT DETECTED Final   Parainfluenza Virus 1 NOT DETECTED NOT DETECTED Final   Parainfluenza Virus 2 NOT DETECTED NOT DETECTED Final   Parainfluenza Virus 3 NOT DETECTED NOT DETECTED Final   Parainfluenza Virus 4 NOT DETECTED NOT DETECTED Final   Respiratory Syncytial Virus NOT DETECTED NOT DETECTED Final   Bordetella pertussis NOT DETECTED NOT DETECTED Final   Chlamydophila pneumoniae NOT DETECTED NOT DETECTED Final   Mycoplasma pneumoniae NOT DETECTED NOT DETECTED Final    Comment: Performed at [facility], [address] [place], [place] [address]  Culture, sputum-assessment     Status: None   Collection Time: 05/29/18 11:43 AM  Result Value Ref Range Status   Specimen Description SPUTUM  Final   Special Requests NONE  Final   Sputum evaluation   Final    THIS SPECIMEN IS ACCEPTABLE FOR SPUTUM CULTURE Performed at [facility], [address], [place], [place] [address]    Report Status 05/29/2018 FINAL  Final  Culture, respiratory     Status: None (Preliminary result)   Collection Time: 05/29/18 11:43 AM  Result Value Ref Range Status   Specimen Description SPUTUM   Final   Special Requests NONE Reflexed from [id number]  Final   Gram Stain PENDING  Incomplete   Culture   Final    CULTURE REINCUBATED FOR BETTER GROWTH Performed at [facility], [place] [address] [place], [place] [address]    Report Status PENDING  Incomplete     Studies: No results  found.  Scheduled Meds: . arformoterol  15 mcg Nebulization BID  . aspirin EC  81 mg Oral Daily  . atorvastatin  20 mg Oral Daily  . azithromycin  250 mg Oral Daily  . budesonide  0.25 mg Inhalation BID  . cefdinir  300 mg Oral Q12H  . enoxaparin (LOVENOX) injection  40 mg Subcutaneous Q24H  . feeding supplement (ENSURE ENLIVE)  237 mL Oral TID BM  . guaiFENesin  600 mg Oral BID  . levalbuterol  0.63 mg Nebulization BID  . lidocaine  1 patch Transdermal Q24H  . losartan  100 mg Oral Daily  . multivitamin with minerals  1 tablet Oral Daily  . predniSONE  40 mg Oral Q breakfast  . tamsulosin  0.4 mg Oral Daily   Continuous Infusions: . sodium chloride 500 mL (05/27/18 1448)    Principal Problem:   Acute respiratory failure with hypoxia (HCC) Active Problems:   Syncope   Bilateral pneumonia   HTN (hypertension)   Urinary retention   Hyperlipidemia    Time spent: 23 minutes    Ullin NP  [facility]  If 7PM-7AM, please contact night-coverage at [email or web address], password [facility] 05/30/2018, 9:26 AM  LOS: 3 days

## 2018-05-30 NOTE — Progress Notes (Signed)
Output 600 ml of urine after I and O

## 2018-05-31 DIAGNOSIS — R55 Syncope and collapse: Secondary | ICD-10-CM | POA: Diagnosis not present

## 2018-05-31 DIAGNOSIS — Z7401 Bed confinement status: Secondary | ICD-10-CM | POA: Diagnosis not present

## 2018-05-31 DIAGNOSIS — J9601 Acute respiratory failure with hypoxia: Secondary | ICD-10-CM | POA: Diagnosis not present

## 2018-05-31 DIAGNOSIS — S20229D Contusion of unspecified back wall of thorax, subsequent encounter: Secondary | ICD-10-CM | POA: Diagnosis not present

## 2018-05-31 DIAGNOSIS — I10 Essential (primary) hypertension: Secondary | ICD-10-CM | POA: Diagnosis not present

## 2018-05-31 DIAGNOSIS — M545 Low back pain: Secondary | ICD-10-CM | POA: Diagnosis not present

## 2018-05-31 DIAGNOSIS — E785 Hyperlipidemia, unspecified: Secondary | ICD-10-CM | POA: Diagnosis not present

## 2018-05-31 DIAGNOSIS — E875 Hyperkalemia: Secondary | ICD-10-CM | POA: Diagnosis not present

## 2018-05-31 DIAGNOSIS — M2550 Pain in unspecified joint: Secondary | ICD-10-CM | POA: Diagnosis not present

## 2018-05-31 DIAGNOSIS — R5381 Other malaise: Secondary | ICD-10-CM | POA: Diagnosis not present

## 2018-05-31 DIAGNOSIS — R05 Cough: Secondary | ICD-10-CM | POA: Diagnosis not present

## 2018-05-31 DIAGNOSIS — J449 Chronic obstructive pulmonary disease, unspecified: Secondary | ICD-10-CM | POA: Diagnosis not present

## 2018-05-31 DIAGNOSIS — J189 Pneumonia, unspecified organism: Secondary | ICD-10-CM | POA: Diagnosis not present

## 2018-05-31 DIAGNOSIS — I2510 Atherosclerotic heart disease of native coronary artery without angina pectoris: Secondary | ICD-10-CM | POA: Diagnosis not present

## 2018-05-31 DIAGNOSIS — W19XXXA Unspecified fall, initial encounter: Secondary | ICD-10-CM | POA: Diagnosis not present

## 2018-05-31 DIAGNOSIS — M6281 Muscle weakness (generalized): Secondary | ICD-10-CM | POA: Diagnosis not present

## 2018-05-31 DIAGNOSIS — R339 Retention of urine, unspecified: Secondary | ICD-10-CM | POA: Diagnosis not present

## 2018-05-31 DIAGNOSIS — E2681 Bartter's syndrome: Secondary | ICD-10-CM | POA: Diagnosis not present

## 2018-05-31 DIAGNOSIS — N400 Enlarged prostate without lower urinary tract symptoms: Secondary | ICD-10-CM | POA: Diagnosis not present

## 2018-05-31 DIAGNOSIS — R41841 Cognitive communication deficit: Secondary | ICD-10-CM | POA: Diagnosis not present

## 2018-05-31 LAB — CBC
HCT: 47.4 % (ref 39.0–52.0)
Hemoglobin: 16.1 g/dL (ref 13.0–17.0)
MCH: 31 pg (ref 26.0–34.0)
MCHC: 34 g/dL (ref 30.0–36.0)
MCV: 91.3 fL (ref 80.0–100.0)
Platelets: 238 10*3/uL (ref 150–400)
RBC: 5.19 MIL/uL (ref 4.22–5.81)
RDW: 14.3 % (ref 11.5–15.5)
WBC: 16.6 10*3/uL — ABNORMAL HIGH (ref 4.0–10.5)
nRBC: 0 % (ref 0.0–0.2)

## 2018-05-31 LAB — CULTURE, RESPIRATORY W GRAM STAIN: Culture: NORMAL

## 2018-05-31 LAB — CULTURE, BLOOD (ROUTINE X 2)
Culture: NO GROWTH
Special Requests: ADEQUATE

## 2018-05-31 LAB — CULTURE, RESPIRATORY

## 2018-05-31 MED ORDER — CEFDINIR 300 MG PO CAPS: 300.0000 mg | ORAL_CAPSULE | Freq: Two times a day (BID) | ORAL | 0 refills | Status: AC

## 2018-05-31 MED ORDER — AZITHROMYCIN 250 MG PO TABS: ORAL_TABLET | ORAL | 0 refills | Status: AC

## 2018-05-31 MED ORDER — ARFORMOTEROL TARTRATE 15 MCG/2ML IN NEBU: 15.0000 ug | INHALATION_SOLUTION | Freq: Two times a day (BID) | RESPIRATORY_TRACT | 0 refills | Status: DC

## 2018-05-31 MED ORDER — TAMSULOSIN HCL 0.4 MG PO CAPS: 0.4000 mg | ORAL_CAPSULE | Freq: Every day | ORAL | 0 refills | Status: AC

## 2018-05-31 MED ORDER — ENSURE ENLIVE PO LIQD: 237.0000 mL | Freq: Three times a day (TID) | ORAL | 12 refills | Status: AC

## 2018-05-31 MED ORDER — LIDOCAINE 5 % EX PTCH: 1.0000 | MEDICATED_PATCH | CUTANEOUS | 0 refills | Status: DC

## 2018-05-31 MED ORDER — PREDNISONE 20 MG PO TABS: 40.0000 mg | ORAL_TABLET | Freq: Every day | ORAL | 0 refills | Status: AC

## 2018-05-31 MED ORDER — ADULT MULTIVITAMIN W/MINERALS CH: 1.0000 | ORAL_TABLET | Freq: Every day | ORAL | 1 refills | Status: AC

## 2018-05-31 NOTE — Progress Notes (Signed)
Report given to [facility]. Awaiting for transport.

## 2018-05-31 NOTE — TOC Transition Note (Signed)
Transition of Care [facility]) - CM/SW Discharge Note   Patient Details  Name: [name] MRN: [id number] Date of Birth: [birth year]  [facility] CM/SW Contact:  Candie Chroman, LCSW Phone Number: 05/31/2018, 1:13 PM   Clinical Narrative:  CSW facilitated patient discharge including contacting patient family and facility to confirm patient discharge plans. Clinical information faxed to facility and family agreeable with plan. CSW arranged ambulance transport via [facility] to [facility] at 2:00. RN to call report prior to discharge [phone]).  CSW will sign off for now as social work intervention is no longer needed. Please consult [place] again if new needs arise.  Final next level of care: Skilled Nursing Facility Barriers to Discharge: Barriers Resolved   Patient Goals and CMS Choice Patient states their goals for this hospitalization and ongoing recovery are:: "To get therapy and go home." CMS Medicare.gov Compare Post Acute Care list provided to:: Patient    Discharge Placement PASRR number recieved: 05/28/18            [facility] at[facility] Patient to be transferred to facility by: [facility] Name of family member notified: Dorethea Clan Patient and family notified of of transfer: 05/31/18  Discharge Plan and Services   Discharge Planning Services: NA Post Acute Care Choice: [place]          DME Arranged: N/A DME Agency: NA HH Arranged: NA     Social Determinants of Health (SDOH) Interventions     Readmission Risk Interventions No flowsheet data found.

## 2018-05-31 NOTE — Progress Notes (Signed)
Pt only voided 100cc urine, bladder scan showing 155cc. Tylene Fantasia NP made aware, awaiting call back.

## 2018-05-31 NOTE — Consult Note (Signed)
   [facility] CM Inpatient Consult   05/31/2018  [name] 1931-08-09 [id number]  Patient screened for Medicare ACO patient and to check if potential [place] Management needs as patient is for transitioning today.  Patient is [name] is a 85 y.o. male admitted with respiratory failure due to fluid overload +/- pneumonia.  He has an episode of syncope this is per MD notes.   Primary Care Provider is Dr. Annye Asa.  Chart review reveals this patient is to transition to a skilled nursing facility for rehab, currently for [facility]. No current community follow up needs assessed at this time.  Please place a [place] Management consult for changes in disposition or for questions contact:   Natividad Brood, RN BSN [facility] Liaison  [phone] business mobile phone Toll free office [phone]

## 2018-05-31 NOTE — Discharge Summary (Signed)
Physician Discharge Summary  CHEE KINSLOW ZOX:[id number] DOB: 1931/12/17 DOA: 05/26/2018  PCP: [name], MD  Admit date: 05/26/2018 Discharge date: 05/31/2018  Time spent: 45 minutes  Recommendations for Outpatient Follow-up:  1. Follow up with PCP 2-3 weeks for evaluation of symptoms 2. Monitor urine output and post urine bladder scan for residual. In and out cath as indicated 3. Wean Oxygen to room air as tolerated   Discharge Diagnoses:  Principal Problem:   Acute respiratory failure with hypoxia ([place]) Active Problems:   Syncope   Bilateral pneumonia   HTN (hypertension)   Urinary retention   Hyperlipidemia   Contusion of back   Fall   Discharge Condition: stable  Diet recommendation: heart healthy  Filed Weights   05/29/18 0457 05/30/18 0500 05/31/18 0531  Weight: 60 kg 63 kg 63.9 kg    History of present illness:   BAIN WHICHARD is a 85 y.o. male with a Past Medical History of CAD CVA, COPD, BPH, and remote tobacco abuse; who presented 3/28 after having a fall.  Found to have suspected bilateral pneumonia with acute respiratory failure.  Provided antibiotics, breathing treatments, and steroids. No fever, travel or sick exposure or concern for COVID-19.   Hospital Course:   Acute respiratory failure with hypoxia secondary to bilateral pneumonia in setting of suspected COPD: Patient with oxygen saturation 88% on room air at admission. At discharge greater than 90% with 2L [place]. Not on oxygen at homeand had not been on any inhalers. Troponin negative. CT chest negative for PE, but concerning for bilateral edema or pneumonia. Respiratory virus panel negative. No significant risk factors for COVID-19.O2 saturations acutely dropped into the lower 80'searly in a.m. on 3/30, requiring patient to be placed on Ventimask.On physical examon 3/30,patient appeared to have crackles concerning for fluid.IV fluids were stopped and he was given 40 mg of Lasix for the  possibility of fluid overload. Diuresed 1L. He also received solumedrol. However repeat chest x-ray showed significant atelectasis and pt developed urinary retention. has 2 days antibiotics remaining and 3 days of steroid burst. Recommending weaning oxygen to room air as tolerated. Patient benefits from frequent coaching of pursed lip breathing.  2. Syncope, PVCs. Patient reports "legs give out". Likely related to #2. No priorreports of coughing, nausea, or vomiting prior to arrival. Echo revealing EF of 60 to 65% with left ventricular diastolic function not able to be evaluated. Telemetry showed frequent PVCs initially but decreased at discharge.  3. Leukocytosis: Suspect secondary to above and in setting of steroids. Afebrile and non-toxic appearing  4. HTN. Fair control. Home meds include cozaar -ContinueCozaar  5. Back pain. Pt with hx chronic back pain. Complains that pain is worse since fall/syncope. Xray and CT chest without acute abnormality as to cause of pain. He does report that it is improving. Tends to resist mobilization. lidocain patch  6. Remote history of tobacco abuse: Patient reports smoking for approximately 40 years anywhere from 1 to 2 packs/day of cigarettes,but quit in 1990s.  6. Positive blood culture: Patient had positive admissionaerobic blood culture, but repeat negative x3 days suspecting likely contaminant.  7. Urinary retention. Patient reports remote hx of same. Has required in and out cath x2. Voided 137ml on day of discharge with 173ml residual. Recommend continuing flomax and monitoring post void residual as well as in and out cath as indicated.    Procedures:  In and out cath x2  Consultations:    Discharge Exam: Vitals:   05/31/18  1191 05/31/18 0929  BP:  (!) 147/93  Pulse:  (!) 104  Resp:  17  Temp:    SpO2: 94% 98%    General: awake alert no acute distress Cardiovascular: rrr no mgr no le edema Respiratory: normal effort  BS slight diminished and coarse bilaterally no wheeze  Discharge Instructions   Discharge Instructions    Diet - low sodium heart healthy   Complete by:  As directed    Discharge instructions   Complete by:  As directed    Take medication as described. Practice pursed lip breathing to slow respiration rate and improve oxygen saturation level   Increase activity slowly   Complete by:  As directed      Allergies as of 05/31/2018   No Known Allergies     Medication List    TAKE these medications   acetaminophen 500 MG tablet Commonly known as:  TYLENOL Take 500-1,000 mg by mouth every 8 (eight) hours as needed for mild pain or headache.   alendronate 70 MG tablet Commonly known as:  FOSAMAX Take 1 tablet (70 mg total) by mouth once a week. TAKE WITH A FULL GLASS OF WATER ON AT NOON TIME   EMPTY STOMACH. What changed:    when to take this  additional instructions   arformoterol 15 MCG/2ML Nebu Commonly known as:  BROVANA Take 2 mLs (15 mcg total) by nebulization 2 (two) times daily.   aspirin EC 81 MG tablet Take 81 mg by mouth daily.   atorvastatin 20 MG tablet Commonly known as:  LIPITOR TAKE ONE TABLET BY MOUTH DAILY   azithromycin 250 MG tablet Commonly known as:  ZITHROMAX po Start taking on:  June 01, 2018   bisacodyl 5 MG EC tablet Commonly known as:  DULCOLAX Take 5 mg by mouth daily as needed for mild constipation or moderate constipation.   cefdinir 300 MG capsule Commonly known as:  OMNICEF Take 1 capsule (300 mg total) by mouth every 12 (twelve) hours for 2 days.   denosumab 60 MG/ML Sosy injection Commonly known as:  PROLIA Inject 60 mg into the skin every 6 (six) months.   diphenhydramine-acetaminophen 25-500 MG Tabs tablet Commonly known as:  TYLENOL PM Take 1 tablet by mouth at bedtime as needed (for sleep or pain).   feeding supplement (ENSURE ENLIVE) Liqd Take 237 mLs by mouth 3 (three) times daily between meals.   Flovent HFA 110  MCG/ACT inhaler Generic drug:  fluticasone USE 2 INHALATIONS TWICE A DAY What changed:  See the new instructions.   ipratropium 17 MCG/ACT inhaler Commonly known as:  ATROVENT HFA Inhale 2 puffs into the lungs every 6 (six) hours as needed for wheezing.   lidocaine 5 % Commonly known as:  LIDODERM Place 1 patch onto the skin daily. Remove & Discard patch within 12 hours or as directed by MD Start taking on:  June 01, 2018   losartan 100 MG tablet Commonly known as:  COZAAR TAKE ONE TABLET BY MOUTH DAILY   multivitamin with minerals Tabs tablet Take 1 tablet by mouth daily. Start taking on:  June 01, 2018   predniSONE 20 MG tablet Commonly known as:  DELTASONE Take 2 tablets (40 mg total) by mouth daily with breakfast for 3 days. Start taking on:  June 01, 2018   Systane 0.4-0.3 % Soln Generic drug:  Polyethyl Glycol-Propyl Glycol Place 1 drop into both eyes 3 (three) times daily as needed (dry eyes).   tamsulosin 0.4 MG Caps  capsule Commonly known as:  FLOMAX Take 1 capsule (0.4 mg total) by mouth daily for 30 days. Start taking on:  June 01, 2018   Vitamin D-3 25 MCG (1000 UT) Caps Take 1,000 Units by mouth daily.      No Known Allergies Contact information for after-discharge care    Destination    [facility] .   Service:  Skilled Nursing Contact information: [address] [place] [phone]               The results of significant diagnostics from this hospitalization (including imaging, microbiology, ancillary and laboratory) are listed below for reference.    Significant Diagnostic Studies: Dg Chest 2 View  Result Date: 05/28/2018 CLINICAL DATA:  Recent fall with hypoxia EXAM: CHEST - 2 VIEW COMPARISON:  05/26/2018 FINDINGS: Cardiac shadow is within normal limits. Aortic calcifications are again seen. The overall inspiratory effort is poor. Mild bibasilar atelectatic changes are noted similar to that seen  on prior CT examination. No focal confluent infiltrate or sizable effusion is seen. IMPRESSION: Bibasilar atelectatic changes stable from the previous exam. No new focal abnormality is seen. Electronically Signed   By: Inez Catalina M.D.   On: 05/28/2018 08:18   Ct Angio Chest Pe W/cm &/or Wo Cm  Result Date: 05/26/2018 CLINICAL DATA:  Chest pain. EXAM: CT ANGIOGRAPHY CHEST WITH CONTRAST TECHNIQUE: Multidetector CT imaging of the chest was performed using the standard protocol during bolus administration of intravenous contrast. Multiplanar CT image reconstructions and MIPs were obtained to evaluate the vascular anatomy. CONTRAST:  4mL OMNIPAQUE IOHEXOL 350 MG/ML SOLN COMPARISON:  None. FINDINGS: Cardiovascular: Satisfactory opacification of the pulmonary arteries to the segmental level. No evidence of pulmonary embolism. Normal heart size. No pericardial effusion. Coronary artery calcifications are noted. Atherosclerosis of thoracic aorta is noted without aneurysm formation. Mediastinum/Nodes: No enlarged mediastinal, hilar, or axillary lymph nodes. Thyroid gland, trachea, and esophagus demonstrate no significant findings. Lungs/Pleura: No pneumothorax or pleural effusion is noted. Mild bilateral posterior basilar subsegmental atelectasis or edema is noted. Probable edema or pneumonia is noted in the upper lobes. Upper Abdomen: No acute abnormality. Musculoskeletal: No chest wall abnormality. No acute or significant osseous findings. Review of the MIP images confirms the above findings. IMPRESSION: No definite evidence of pulmonary embolus. Mild bilateral posterior basilar subsegmental atelectasis or edema is noted. Probable mild edema or pneumonia is noted in both upper lobes. Coronary artery calcifications are noted. Aortic Atherosclerosis (ICD10-I70.0). Electronically Signed   By: Marijo Conception, M.D.   On: 05/26/2018 12:48   Dg Chest Port 1 View  Result Date: 05/26/2018 CLINICAL DATA:  Shortness of  breath.  Mental status changes. EXAM: PORTABLE CHEST 1 VIEW COMPARISON:  10/31/2017 and 03/23/2016 FINDINGS: Frontal view of the chest. Reverse apical lordotic positioning. Chin overlies the apices. Normal heart size. Atherosclerosis in the transverse aorta. No pleural effusion or pneumothorax. Low lung volumes. Peripheral and basilar predominant interstitial thickening may be slightly progressive, but is accentuated by technique. No lobar consolidation. IMPRESSION: Interstitial lung disease, accentuated by AP portable technique and low lung volumes. This may be slightly progressive over prior exams. No convincing evidence of acute superimposed process. Aortic Atherosclerosis (ICD10-I70.0). Electronically Signed   By: Abigail Miyamoto M.D.   On: 05/26/2018 10:47    Microbiology: Recent Results (from the past 240 hour(s))  Culture, blood (routine x 2)     Status: Abnormal   Collection Time: 05/26/18  1:30 PM  Result Value  Ref Range Status   Specimen Description BLOOD RIGHT ANTECUBITAL  Final   Special Requests   Final    BOTTLES DRAWN AEROBIC AND ANAEROBIC Blood Culture adequate volume   Culture  Setup Time   Final    GRAM POSITIVE COCCI IN CLUSTERS AEROBIC BOTTLE ONLY CRITICAL RESULT CALLED TO, READ BACK BY AND VERIFIED WITH: J. LEDFORD,PHARMD [id number] 05/28/2018 T. TYSOR    Culture (A)  Final    STAPHYLOCOCCUS SPECIES (COAGULASE NEGATIVE) THE SIGNIFICANCE OF ISOLATING THIS ORGANISM FROM A SINGLE SET OF BLOOD CULTURES WHEN MULTIPLE SETS ARE DRAWN IS UNCERTAIN. PLEASE NOTIFY THE MICROBIOLOGY DEPARTMENT WITHIN ONE WEEK IF SPECIATION AND SENSITIVITIES ARE REQUIRED. Performed at [facility], [place] [address], [place], [place] [address]    Report Status 05/30/2018 FINAL  Final  Blood Culture ID Panel (Reflexed)     Status: Abnormal   Collection Time: 05/26/18  1:30 PM  Result Value Ref Range Status   Enterococcus species NOT DETECTED NOT DETECTED Final   Listeria monocytogenes NOT DETECTED NOT  DETECTED Final   Staphylococcus species DETECTED (A) NOT DETECTED Final    Comment: Methicillin (oxacillin) susceptible coagulase negative staphylococcus. Possible blood culture contaminant (unless isolated from more than one blood culture draw or clinical case suggests pathogenicity). No antibiotic treatment is indicated for blood  culture contaminants. CRITICAL RESULT CALLED TO, READ BACK BY AND VERIFIED WITH: J. LEDFORD,PHARMD [id number] 05/28/2018 T. TYSOR    Staphylococcus aureus (BCID) NOT DETECTED NOT DETECTED Final   Methicillin resistance NOT DETECTED NOT DETECTED Final   Streptococcus species NOT DETECTED NOT DETECTED Final   Streptococcus agalactiae NOT DETECTED NOT DETECTED Final   Streptococcus pneumoniae NOT DETECTED NOT DETECTED Final   Streptococcus pyogenes NOT DETECTED NOT DETECTED Final   Acinetobacter baumannii NOT DETECTED NOT DETECTED Final   Enterobacteriaceae species NOT DETECTED NOT DETECTED Final   Enterobacter cloacae complex NOT DETECTED NOT DETECTED Final   Escherichia coli NOT DETECTED NOT DETECTED Final   Klebsiella oxytoca NOT DETECTED NOT DETECTED Final   Klebsiella pneumoniae NOT DETECTED NOT DETECTED Final   Proteus species NOT DETECTED NOT DETECTED Final   Serratia marcescens NOT DETECTED NOT DETECTED Final   Haemophilus influenzae NOT DETECTED NOT DETECTED Final   Neisseria meningitidis NOT DETECTED NOT DETECTED Final   Pseudomonas aeruginosa NOT DETECTED NOT DETECTED Final   Candida albicans NOT DETECTED NOT DETECTED Final   Candida glabrata NOT DETECTED NOT DETECTED Final   Candida krusei NOT DETECTED NOT DETECTED Final   Candida parapsilosis NOT DETECTED NOT DETECTED Final   Candida tropicalis NOT DETECTED NOT DETECTED Final    Comment: Performed at [facility], [address] [place], [place] [address]  Culture, blood (routine x 2)     Status: None (Preliminary result)   Collection Time: 05/26/18  1:36 PM  Result Value Ref Range Status    Specimen Description BLOOD LEFT FOREARM  Final   Special Requests   Final    BOTTLES DRAWN AEROBIC AND ANAEROBIC Blood Culture adequate volume   Culture   Final    NO GROWTH 4 DAYS Performed at [facility], [place] [address], [place],  [address]    Report Status PENDING  Incomplete  Respiratory Panel by PCR     Status: None   Collection Time: 05/27/18 12:18 PM  Result Value Ref Range Status   Adenovirus NOT DETECTED NOT DETECTED Final   Coronavirus 229E NOT DETECTED NOT DETECTED Final    Comment: (NOTE)  The Coronavirus on the Respiratory Panel, DOES NOT test for the novel  Coronavirus (2019 nCoV)    Coronavirus HKU1 NOT DETECTED NOT DETECTED Final   Coronavirus NL63 NOT DETECTED NOT DETECTED Final   Coronavirus OC43 NOT DETECTED NOT DETECTED Final   Metapneumovirus NOT DETECTED NOT DETECTED Final   Rhinovirus / Enterovirus NOT DETECTED NOT DETECTED Final   Influenza A NOT DETECTED NOT DETECTED Final   Influenza B NOT DETECTED NOT DETECTED Final   Parainfluenza Virus 1 NOT DETECTED NOT DETECTED Final   Parainfluenza Virus 2 NOT DETECTED NOT DETECTED Final   Parainfluenza Virus 3 NOT DETECTED NOT DETECTED Final   Parainfluenza Virus 4 NOT DETECTED NOT DETECTED Final   Respiratory Syncytial Virus NOT DETECTED NOT DETECTED Final   Bordetella pertussis NOT DETECTED NOT DETECTED Final   Chlamydophila pneumoniae NOT DETECTED NOT DETECTED Final   Mycoplasma pneumoniae NOT DETECTED NOT DETECTED Final    Comment: Performed at [facility], [place] [address] [place], [place] [address]  Culture, sputum-assessment     Status: None   Collection Time: 05/29/18 11:43 AM  Result Value Ref Range Status   Specimen Description SPUTUM  Final   Special Requests NONE  Final   Sputum evaluation   Final    THIS SPECIMEN IS ACCEPTABLE FOR SPUTUM CULTURE Performed at [facility], [address], [place], [place] [address]    Report Status 05/29/2018 FINAL  Final  Culture,  respiratory     Status: None   Collection Time: 05/29/18 11:43 AM  Result Value Ref Range Status   Specimen Description SPUTUM  Final   Special Requests NONE Reflexed from [id number]  Final   Gram Stain   Final    FEW WBC PRESENT,BOTH PMN AND MONONUCLEAR MODERATE GRAM POSITIVE COCCI IN PAIRS    Culture   Final    MODERATE Consistent with normal respiratory flora. Performed at [facility], [place] [address] [place], [place] [address]    Report Status 05/31/2018 FINAL  Final     Labs: Basic Metabolic Panel: Recent Labs  Lab 05/26/18 1015 05/27/18 0709 05/28/18 0500 05/29/18 0517  NA 135  --  136 136  K 3.8  --  4.0 4.4  CL 104  --  104 101  CO2 22  --  22 25  GLUCOSE 133*  --  141* 118*  BUN 17  --  11 21  CREATININE 0.75  --  0.70 1.03  CALCIUM 8.9  --  8.5* 8.7*  MG  --  1.8  --   --    Liver Function Tests: Recent Labs  Lab 05/26/18 1015  AST 30  ALT 43  ALKPHOS 69  BILITOT 1.5*  PROT 5.7*  ALBUMIN 3.5   No results for input(s): LIPASE, AMYLASE in the last 168 hours. No results for input(s): AMMONIA in the last 168 hours. CBC: Recent Labs  Lab 05/26/18 1015 05/28/18 0500 05/31/18 0947  WBC 15.6* 12.4* 16.6*  NEUTROABS 12.3*  --   --   HGB 15.5 14.4 16.1  HCT 46.6 43.8 47.4  MCV 92.6 90.9 91.3  PLT 227 177 238   Cardiac Enzymes: Recent Labs  Lab 05/26/18 1015 05/26/18 1933 05/27/18 0051 05/27/18 0709  TROPONINI <0.03 <0.03 <0.03 <0.03   BNP: BNP (last 3 results) Recent Labs    05/26/18 1015  BNP 27.5    ProBNP (last 3 results) No results for input(s): PROBNP in the last 8760 hours.  CBG:  No results for input(s): GLUCAP in the last 168 hours.     SignedRadene Gunning NP [facility] 05/31/2018, 10:44 AM

## 2018-06-04 DIAGNOSIS — J189 Pneumonia, unspecified organism: Secondary | ICD-10-CM | POA: Diagnosis not present

## 2018-06-04 DIAGNOSIS — J9601 Acute respiratory failure with hypoxia: Secondary | ICD-10-CM | POA: Diagnosis not present

## 2018-06-04 DIAGNOSIS — R339 Retention of urine, unspecified: Secondary | ICD-10-CM | POA: Diagnosis not present

## 2018-06-04 DIAGNOSIS — R55 Syncope and collapse: Secondary | ICD-10-CM | POA: Diagnosis not present

## 2018-06-07 DIAGNOSIS — J189 Pneumonia, unspecified organism: Secondary | ICD-10-CM | POA: Diagnosis not present

## 2018-06-07 DIAGNOSIS — I10 Essential (primary) hypertension: Secondary | ICD-10-CM | POA: Diagnosis not present

## 2018-06-07 DIAGNOSIS — I2510 Atherosclerotic heart disease of native coronary artery without angina pectoris: Secondary | ICD-10-CM | POA: Diagnosis not present

## 2018-06-07 DIAGNOSIS — N400 Enlarged prostate without lower urinary tract symptoms: Secondary | ICD-10-CM | POA: Diagnosis not present

## 2018-06-11 DIAGNOSIS — N400 Enlarged prostate without lower urinary tract symptoms: Secondary | ICD-10-CM | POA: Diagnosis not present

## 2018-06-11 DIAGNOSIS — J189 Pneumonia, unspecified organism: Secondary | ICD-10-CM | POA: Diagnosis not present

## 2018-06-11 DIAGNOSIS — J449 Chronic obstructive pulmonary disease, unspecified: Secondary | ICD-10-CM | POA: Diagnosis not present

## 2018-06-11 DIAGNOSIS — J9601 Acute respiratory failure with hypoxia: Secondary | ICD-10-CM | POA: Diagnosis not present

## 2018-06-14 DIAGNOSIS — R339 Retention of urine, unspecified: Secondary | ICD-10-CM | POA: Diagnosis not present

## 2018-06-14 DIAGNOSIS — E875 Hyperkalemia: Secondary | ICD-10-CM | POA: Diagnosis not present

## 2018-06-14 DIAGNOSIS — N400 Enlarged prostate without lower urinary tract symptoms: Secondary | ICD-10-CM | POA: Diagnosis not present

## 2018-06-14 DIAGNOSIS — J189 Pneumonia, unspecified organism: Secondary | ICD-10-CM | POA: Diagnosis not present

## 2018-06-19 DIAGNOSIS — J189 Pneumonia, unspecified organism: Secondary | ICD-10-CM | POA: Diagnosis not present

## 2018-06-19 DIAGNOSIS — J449 Chronic obstructive pulmonary disease, unspecified: Secondary | ICD-10-CM | POA: Diagnosis not present

## 2018-06-19 DIAGNOSIS — J9601 Acute respiratory failure with hypoxia: Secondary | ICD-10-CM | POA: Diagnosis not present

## 2018-06-19 DIAGNOSIS — E875 Hyperkalemia: Secondary | ICD-10-CM | POA: Diagnosis not present

## 2018-06-22 ENCOUNTER — Telehealth: Payer: Self-pay | Admitting: Family Medicine

## 2018-06-22 DIAGNOSIS — I10 Essential (primary) hypertension: Secondary | ICD-10-CM | POA: Diagnosis not present

## 2018-06-22 DIAGNOSIS — Z9981 Dependence on supplemental oxygen: Secondary | ICD-10-CM | POA: Diagnosis not present

## 2018-06-22 DIAGNOSIS — J449 Chronic obstructive pulmonary disease, unspecified: Secondary | ICD-10-CM | POA: Diagnosis not present

## 2018-06-22 DIAGNOSIS — Z7982 Long term (current) use of aspirin: Secondary | ICD-10-CM | POA: Diagnosis not present

## 2018-06-22 DIAGNOSIS — Z87891 Personal history of nicotine dependence: Secondary | ICD-10-CM | POA: Diagnosis not present

## 2018-06-22 DIAGNOSIS — J9601 Acute respiratory failure with hypoxia: Secondary | ICD-10-CM | POA: Diagnosis not present

## 2018-06-22 DIAGNOSIS — W19XXXD Unspecified fall, subsequent encounter: Secondary | ICD-10-CM | POA: Diagnosis not present

## 2018-06-22 DIAGNOSIS — Z8701 Personal history of pneumonia (recurrent): Secondary | ICD-10-CM | POA: Diagnosis not present

## 2018-06-22 DIAGNOSIS — I2510 Atherosclerotic heart disease of native coronary artery without angina pectoris: Secondary | ICD-10-CM | POA: Diagnosis not present

## 2018-06-22 DIAGNOSIS — Z8673 Personal history of transient ischemic attack (TIA), and cerebral infarction without residual deficits: Secondary | ICD-10-CM | POA: Diagnosis not present

## 2018-06-22 NOTE — Telephone Encounter (Signed)
Copied from [place] [phone]. Topic: Quick Communication - Home Health Verbal Orders >> Jun 22, 2018  2:13 PM Celene Kras A wrote: Caller/Agency: [facility] Number: [phone] Requesting OT/PT/Skilled Nursing/Social Work/Speech Therapy: Skilled Nursing Frequency: 1x for 9wks  Donita also states an order for a Nebulizer needs to be faxed over for pt. She did not have fax # at time of call. Please advise.

## 2018-06-22 NOTE — Telephone Encounter (Signed)
Spoke with Donita.  They do not need the nebulizer orders.   Verbal was given for skilled nursing.  He will also be receiving a home health aid a few times a week as well.

## 2018-06-22 NOTE — Telephone Encounter (Signed)
[facility] for orders Are they asking for Albuterol nebs?  Please have Donita clarify.Marland KitchenMarland Kitchen

## 2018-06-24 DIAGNOSIS — J449 Chronic obstructive pulmonary disease, unspecified: Secondary | ICD-10-CM | POA: Diagnosis not present

## 2018-06-24 DIAGNOSIS — Z8701 Personal history of pneumonia (recurrent): Secondary | ICD-10-CM | POA: Diagnosis not present

## 2018-06-24 DIAGNOSIS — I10 Essential (primary) hypertension: Secondary | ICD-10-CM | POA: Diagnosis not present

## 2018-06-24 DIAGNOSIS — I2510 Atherosclerotic heart disease of native coronary artery without angina pectoris: Secondary | ICD-10-CM | POA: Diagnosis not present

## 2018-06-24 DIAGNOSIS — W19XXXD Unspecified fall, subsequent encounter: Secondary | ICD-10-CM | POA: Diagnosis not present

## 2018-06-24 DIAGNOSIS — J9601 Acute respiratory failure with hypoxia: Secondary | ICD-10-CM | POA: Diagnosis not present

## 2018-06-25 ENCOUNTER — Telehealth: Payer: Self-pay | Admitting: Family Medicine

## 2018-06-25 DIAGNOSIS — W19XXXD Unspecified fall, subsequent encounter: Secondary | ICD-10-CM | POA: Diagnosis not present

## 2018-06-25 DIAGNOSIS — J449 Chronic obstructive pulmonary disease, unspecified: Secondary | ICD-10-CM | POA: Diagnosis not present

## 2018-06-25 DIAGNOSIS — I10 Essential (primary) hypertension: Secondary | ICD-10-CM | POA: Diagnosis not present

## 2018-06-25 DIAGNOSIS — Z8701 Personal history of pneumonia (recurrent): Secondary | ICD-10-CM | POA: Diagnosis not present

## 2018-06-25 DIAGNOSIS — J9601 Acute respiratory failure with hypoxia: Secondary | ICD-10-CM | POA: Diagnosis not present

## 2018-06-25 DIAGNOSIS — I2510 Atherosclerotic heart disease of native coronary artery without angina pectoris: Secondary | ICD-10-CM | POA: Diagnosis not present

## 2018-06-25 NOTE — Telephone Encounter (Signed)
[facility] for all orders

## 2018-06-25 NOTE — Telephone Encounter (Signed)
Copied from [place] [phone]. Topic: Quick Communication - Home Health Verbal Orders >> Jun 25, 2018  9:04 AM Rayann Heman wrote: Caller/Agency:[facility]  Callback Number:[phone] Requesting PT Frequency:2x1 1x7

## 2018-06-25 NOTE — Telephone Encounter (Signed)
Spoke with patient's son and scheduled hosp f/u.

## 2018-06-25 NOTE — Telephone Encounter (Signed)
[place] for orders? 

## 2018-06-25 NOTE — Telephone Encounter (Signed)
Patients son asked for a call back to discuss the discharge from the living facility for his dad and the medications he may need refilled.

## 2018-06-25 NOTE — Telephone Encounter (Signed)
[place] given OK per PCP

## 2018-06-25 NOTE — Telephone Encounter (Signed)
[place] given OK per PCP

## 2018-06-25 NOTE — Telephone Encounter (Signed)
Copied from [place] [phone]. Topic: Quick Communication - See Telephone Encounter >> Jun 25, 2018  8:41 AM Robina Ade, Helene Kelp D wrote: CRM for notification. See Telephone encounter for: 06/25/18. Erlene Quan with [facility] called and would like verbal orders as follow for patient: order for OT for 2x for 3 weeks. He can be reached at [phone].

## 2018-06-26 ENCOUNTER — Encounter: Payer: Self-pay | Admitting: Family Medicine

## 2018-06-26 ENCOUNTER — Ambulatory Visit (INDEPENDENT_AMBULATORY_CARE_PROVIDER_SITE_OTHER): Payer: Medicare Other | Admitting: Family Medicine

## 2018-06-26 ENCOUNTER — Ambulatory Visit: Payer: Medicare Other | Admitting: Family Medicine

## 2018-06-26 ENCOUNTER — Other Ambulatory Visit: Payer: Self-pay

## 2018-06-26 VITALS — BP 140/90 | HR 76 | Temp 97.7°F

## 2018-06-26 DIAGNOSIS — R339 Retention of urine, unspecified: Secondary | ICD-10-CM

## 2018-06-26 DIAGNOSIS — J189 Pneumonia, unspecified organism: Secondary | ICD-10-CM | POA: Diagnosis not present

## 2018-06-26 DIAGNOSIS — J9601 Acute respiratory failure with hypoxia: Secondary | ICD-10-CM | POA: Diagnosis not present

## 2018-06-26 NOTE — Progress Notes (Signed)
I have discussed the procedure for the virtual visit with the patient who has given consent to proceed with assessment and treatment.   Jessica L Brodmerkel, CMA     

## 2018-06-26 NOTE — Progress Notes (Signed)
Virtual Visit via Video   I connected with patient on 06/26/18 at  9:00 AM EDT by a video enabled telemedicine application and verified that I am speaking with the correct person using two identifiers.  Location patient: Home Location provider: Acupuncturist, Office Persons participating in the virtual visit: Patient, Provider, [place] (Jess B)  I discussed the limitations of evaluation and management by telemedicine and the availability of in person appointments. The patient expressed understanding and agreed to proceed.  Subjective:   HPI:   Hospital F/U- pt was admitted 3/28- 4/2 for acute respiratory failure w/ double PNA.  Pt was dc'd on [place] with the idea of weaning as tolerated.  Pt continues O2 via [place] at 1 L/min.  If pt gets up to use the restroom and removes O2 he 'drops to the 70s'.  Has completed all abx.  Pt is told by home health that he continues to have bilateral crackles.  No fevers recently.  Using Flovent BID and Brovana BID.  Has started [facility] PT/OT.  Pt reports feeling 'pretty good'.  Had some urinary retention while hospitalized.  Is on Flomax and denies any urinary sxs.  Reviewed hospital d/c summary, medications.  ROS:   See pertinent positives and negatives per HPI.  Patient Active Problem List   Diagnosis Date Noted  . Urinary retention 05/30/2018  . Contusion of back   . Fall   . Acute respiratory failure with hypoxia ([place]) 05/27/2018  . Bilateral pneumonia 05/27/2018  . Syncope 05/26/2018  . Thyroid nodule 04/12/2018  . Physical exam 10/08/2014  . Facial skin lesion 04/08/2014  . Elevated WBCs 11/22/2013  . HTN (hypertension) 10/15/2013  . Osteoporosis 10/15/2013  . History of CVA (cerebrovascular accident) 10/15/2013  . History of compression fracture of spine 10/15/2013  . Recurrent pneumonia 10/15/2013  . Hyperlipidemia 10/15/2013  . Low testosterone 10/15/2013  . Carotid stenosis 10/15/2013    Social History   Tobacco Use  . Smoking status:  Former Smoker    Packs/day: 2.00    Years: 20.00    Pack years: 40.00    Types: Cigarettes    Last attempt to quit: 02/29/1988    Years since quitting: 30.3  . Smokeless tobacco: Never Used  Substance Use Topics  . Alcohol use: No    Alcohol/week: 0.0 standard drinks    Current Outpatient Medications:  .  acetaminophen (TYLENOL) 500 MG tablet, Take 500-1,000 mg by mouth every 8 (eight) hours as needed for mild pain or headache. , Disp: , Rfl:  .  arformoterol (BROVANA) 15 MCG/2ML NEBU, Take 2 mLs (15 mcg total) by nebulization 2 (two) times daily., Disp: 120 mL, Rfl: 0 .  aspirin EC 81 MG tablet, Take 81 mg by mouth daily., Disp: , Rfl:  .  atenolol (TENORMIN) 25 MG tablet, Take by mouth daily., Disp: , Rfl:  .  atorvastatin (LIPITOR) 20 MG tablet, TAKE ONE TABLET BY MOUTH DAILY (Patient taking differently: Take 20 mg by mouth daily. ), Disp: 90 tablet, Rfl: 0 .  bisacodyl (DULCOLAX) 5 MG EC tablet, Take 5 mg by mouth daily as needed for mild constipation or moderate constipation., Disp: , Rfl:  .  Cholecalciferol (VITAMIN D-3) 1000 units CAPS, Take 1,000 Units by mouth daily. , Disp: , Rfl:  .  diphenhydramine-acetaminophen (TYLENOL PM) 25-500 MG TABS tablet, Take 1 tablet by mouth at bedtime as needed (for sleep or pain)., Disp: , Rfl:  .  feeding supplement, ENSURE ENLIVE, (ENSURE ENLIVE) LIQD, Take  237 mLs by mouth 3 (three) times daily between meals., Disp: 237 mL, Rfl: 12 .  FLOVENT HFA 110 MCG/ACT inhaler, USE 2 INHALATIONS TWICE A DAY (Patient taking differently: Inhale 2 puffs into the lungs daily. ), Disp: 36 g, Rfl: 1 .  ipratropium (ATROVENT HFA) 17 MCG/ACT inhaler, Inhale 2 puffs into the lungs every 6 (six) hours as needed for wheezing., Disp: 3 Inhaler, Rfl: 3 .  lidocaine (LIDODERM) 5 %, Place 1 patch onto the skin daily. Remove & Discard patch within 12 hours or as directed by MD, Disp: 30 patch, Rfl: 0 .  Multiple Vitamin (MULTIVITAMIN WITH MINERALS) TABS tablet, Take 1  tablet by mouth daily., Disp: 30 tablet, Rfl: 1 .  Polyethyl Glycol-Propyl Glycol (SYSTANE) 0.4-0.3 % SOLN, Place 1 drop into both eyes 3 (three) times daily as needed (dry eyes). , Disp: , Rfl:  .  tamsulosin (FLOMAX) 0.4 MG CAPS capsule, Take 1 capsule (0.4 mg total) by mouth daily for 30 days., Disp: 30 capsule, Rfl: 0 .  alendronate (FOSAMAX) 70 MG tablet, Take 1 tablet (70 mg total) by mouth once a week. TAKE WITH A FULL GLASS OF WATER ON AT NOON TIME   EMPTY STOMACH. (Patient taking differently: Take 70 mg by mouth every Friday. take with a full glass of water on an empty stomach), Disp: 4 tablet, Rfl: 12 .  denosumab (PROLIA) 60 MG/ML SOSY injection, Inject 60 mg into the skin every 6 (six) months., Disp: , Rfl:   No Known Allergies  Objective:   There were no vitals taken for this visit.  AAOx3, NAD NCAT, EOMI No obvious CN deficits O2 via [place] in place Coloring WNL Pt is able to speak clearly, coherently without shortness of breath or increased work of breathing.  Thought process is linear.  Mood is appropriate.   Assessment and Plan:   Acute respiratory failure- resolved.  Pt continues to require 1 L O2 via [place].  Instructed him to wear this at all times to prevent his sats from dropping.  Pt is agreeable to this.  PT/OT are coming to work with him to regain his strength.  For the first time, he appears very frail and not like his old self.  Bilateral PNA- pt has completed all abx but HH reports they still hear crackles bilaterally.  No fevers.  Using his inhalers and breathing treatments as directed.  Instructed pt and son to monitor for fever, worsening SOB, or worsening cough as pt would again need to be seen.  Pt expressed understanding and is in agreement w/ plan.   Urinary retention- pt denies problems w/ this at this time.  Continue Flomax.  Will follow.  Annye Asa, MD 06/26/2018

## 2018-06-27 ENCOUNTER — Telehealth: Payer: Self-pay | Admitting: Family Medicine

## 2018-06-27 DIAGNOSIS — I2510 Atherosclerotic heart disease of native coronary artery without angina pectoris: Secondary | ICD-10-CM | POA: Diagnosis not present

## 2018-06-27 DIAGNOSIS — J9601 Acute respiratory failure with hypoxia: Secondary | ICD-10-CM | POA: Diagnosis not present

## 2018-06-27 DIAGNOSIS — I10 Essential (primary) hypertension: Secondary | ICD-10-CM | POA: Diagnosis not present

## 2018-06-27 DIAGNOSIS — J449 Chronic obstructive pulmonary disease, unspecified: Secondary | ICD-10-CM | POA: Diagnosis not present

## 2018-06-27 DIAGNOSIS — Z8701 Personal history of pneumonia (recurrent): Secondary | ICD-10-CM | POA: Diagnosis not present

## 2018-06-27 DIAGNOSIS — W19XXXD Unspecified fall, subsequent encounter: Secondary | ICD-10-CM | POA: Diagnosis not present

## 2018-06-27 NOTE — Telephone Encounter (Signed)
Copied from [place] [id number]. Topic: Quick Communication - See Telephone Encounter >> Jun 27, 2018  4:46 PM Antonieta Iba C wrote: CRM for notification. See Telephone encounter for: 06/27/18.  Annasha with home health agency is calling in to be advised. She said that pt has had pneumonia in the past twice. She checked pt's lungs today and it sounds like pt still could have pneumonia. Annasha would like to be advised further?   CB: [phone]

## 2018-06-28 DIAGNOSIS — I10 Essential (primary) hypertension: Secondary | ICD-10-CM | POA: Diagnosis not present

## 2018-06-28 DIAGNOSIS — I2510 Atherosclerotic heart disease of native coronary artery without angina pectoris: Secondary | ICD-10-CM

## 2018-06-28 DIAGNOSIS — W19XXXD Unspecified fall, subsequent encounter: Secondary | ICD-10-CM

## 2018-06-28 DIAGNOSIS — Z8701 Personal history of pneumonia (recurrent): Secondary | ICD-10-CM

## 2018-06-28 DIAGNOSIS — J9601 Acute respiratory failure with hypoxia: Secondary | ICD-10-CM

## 2018-06-28 DIAGNOSIS — Z8673 Personal history of transient ischemic attack (TIA), and cerebral infarction without residual deficits: Secondary | ICD-10-CM

## 2018-06-28 DIAGNOSIS — Z87891 Personal history of nicotine dependence: Secondary | ICD-10-CM

## 2018-06-28 DIAGNOSIS — Z9981 Dependence on supplemental oxygen: Secondary | ICD-10-CM | POA: Diagnosis not present

## 2018-06-28 DIAGNOSIS — J449 Chronic obstructive pulmonary disease, unspecified: Secondary | ICD-10-CM

## 2018-06-28 DIAGNOSIS — Z7982 Long term (current) use of aspirin: Secondary | ICD-10-CM

## 2018-06-28 NOTE — Telephone Encounter (Signed)
No need for further work up at this time but if his sxs change, he needs to alert [place] right away

## 2018-06-28 NOTE — Telephone Encounter (Signed)
Noted. Pt made aware.

## 2018-06-28 NOTE — Telephone Encounter (Signed)
Tried calling nurse this morning LMOVM to return call to verify symptoms. Wanted to route to PCP for advice.

## 2018-06-28 NOTE — Telephone Encounter (Signed)
Does he have a fever or increased SOB?  His lungs can take awhile to clear so I would be more concerned with his sxs (cough, SOB, increased work of breathing) and O2 sats/temp/respiratory rate/HR

## 2018-06-28 NOTE — Telephone Encounter (Signed)
Called and spoke with pt and he advised that he is having a slight cough, no fever, no SOB, says that he is feeling good. Said he told the nurse that he always has a slight crackle in his lungs.

## 2018-06-29 DIAGNOSIS — J9601 Acute respiratory failure with hypoxia: Secondary | ICD-10-CM | POA: Diagnosis not present

## 2018-06-29 DIAGNOSIS — W19XXXD Unspecified fall, subsequent encounter: Secondary | ICD-10-CM | POA: Diagnosis not present

## 2018-06-29 DIAGNOSIS — I2510 Atherosclerotic heart disease of native coronary artery without angina pectoris: Secondary | ICD-10-CM | POA: Diagnosis not present

## 2018-06-29 DIAGNOSIS — I10 Essential (primary) hypertension: Secondary | ICD-10-CM | POA: Diagnosis not present

## 2018-06-29 DIAGNOSIS — J449 Chronic obstructive pulmonary disease, unspecified: Secondary | ICD-10-CM | POA: Diagnosis not present

## 2018-06-29 DIAGNOSIS — Z8701 Personal history of pneumonia (recurrent): Secondary | ICD-10-CM | POA: Diagnosis not present

## 2018-07-02 ENCOUNTER — Telehealth: Payer: Self-pay | Admitting: General Practice

## 2018-07-02 NOTE — Telephone Encounter (Signed)
Son states that he is having problems swallowing pills in general.  Food is okay, but he can't seem to get his pills down.   Home Health nurse said she wasn't sure if he would need a swallow study or not.     The sore throat is not necessarily contributing to this.  They are questioning if he can stop the nebulizer treatments. They have 4 more days of treatment in the box and are asking if he should finish this or if it's okay to stop.  I did give the advise on rinsing/drinking water after each treatment.   Also asking if he could have another chest xray.  Home health nurse has stated that he does have some rattle in his chest and they would like to get this checked.

## 2018-07-02 NOTE — Telephone Encounter (Signed)
I am happy to stop the nebs- assuming that he is breathing better and doesn't need them.  In regards to the swallowing and the CXR, I would like to discuss this w/ pt and family.  If possible, I would like to do a visit with them to assess.

## 2018-07-02 NOTE — Telephone Encounter (Signed)
I don't understand this message.  Is he able to swallow the pills that he's currently on?  Is he having difficulty swallowing b/c of the sore throat?  He should rinse his mouth/drink water after each neb treatment to help w/ irritation.

## 2018-07-02 NOTE — Telephone Encounter (Signed)
Copied from [place] [id number]. Topic: General - Other >> Jul 02, 2018 10:13 AM Rutherford Nail, NT wrote: Reason for CRM: Patient's son, [name], calling and states that the patient is having difficulty swallowing pills. States that he is on Brovana nebulizer solution and it seems to make throat sore and wants to know if they need to go off nebulizer a while or change the medication? Please advise.  CB#: [phone]

## 2018-07-02 NOTE — Telephone Encounter (Signed)
Spoke with patient's son.   Virtual visit has been scheduled on Wednesday.

## 2018-07-03 DIAGNOSIS — I10 Essential (primary) hypertension: Secondary | ICD-10-CM | POA: Diagnosis not present

## 2018-07-03 DIAGNOSIS — J449 Chronic obstructive pulmonary disease, unspecified: Secondary | ICD-10-CM | POA: Diagnosis not present

## 2018-07-03 DIAGNOSIS — I2510 Atherosclerotic heart disease of native coronary artery without angina pectoris: Secondary | ICD-10-CM | POA: Diagnosis not present

## 2018-07-03 DIAGNOSIS — Z8701 Personal history of pneumonia (recurrent): Secondary | ICD-10-CM | POA: Diagnosis not present

## 2018-07-03 DIAGNOSIS — W19XXXD Unspecified fall, subsequent encounter: Secondary | ICD-10-CM | POA: Diagnosis not present

## 2018-07-03 DIAGNOSIS — J9601 Acute respiratory failure with hypoxia: Secondary | ICD-10-CM | POA: Diagnosis not present

## 2018-07-04 ENCOUNTER — Ambulatory Visit (INDEPENDENT_AMBULATORY_CARE_PROVIDER_SITE_OTHER): Payer: Medicare Other | Admitting: Family Medicine

## 2018-07-04 ENCOUNTER — Encounter: Payer: Self-pay | Admitting: Family Medicine

## 2018-07-04 VITALS — HR 89 | Wt 135.0 lb

## 2018-07-04 DIAGNOSIS — Z8701 Personal history of pneumonia (recurrent): Secondary | ICD-10-CM | POA: Diagnosis not present

## 2018-07-04 DIAGNOSIS — J189 Pneumonia, unspecified organism: Secondary | ICD-10-CM | POA: Diagnosis not present

## 2018-07-04 DIAGNOSIS — R1310 Dysphagia, unspecified: Secondary | ICD-10-CM | POA: Diagnosis not present

## 2018-07-04 DIAGNOSIS — I10 Essential (primary) hypertension: Secondary | ICD-10-CM | POA: Diagnosis not present

## 2018-07-04 DIAGNOSIS — I2510 Atherosclerotic heart disease of native coronary artery without angina pectoris: Secondary | ICD-10-CM | POA: Diagnosis not present

## 2018-07-04 DIAGNOSIS — J9601 Acute respiratory failure with hypoxia: Secondary | ICD-10-CM | POA: Diagnosis not present

## 2018-07-04 DIAGNOSIS — J449 Chronic obstructive pulmonary disease, unspecified: Secondary | ICD-10-CM | POA: Diagnosis not present

## 2018-07-04 DIAGNOSIS — W19XXXD Unspecified fall, subsequent encounter: Secondary | ICD-10-CM | POA: Diagnosis not present

## 2018-07-04 MED ORDER — ATENOLOL 25 MG PO TABS: 25.0000 mg | ORAL_TABLET | Freq: Every day | ORAL | 3 refills | Status: DC

## 2018-07-04 NOTE — Progress Notes (Signed)
Virtual Visit via Video   I connected with patient on 07/04/18 at 11:40 AM EDT by a video enabled telemedicine application and verified that I am speaking with the correct person using two identifiers.  Location patient: Home Location provider: Acupuncturist, Office Persons participating in the virtual visit: Patient, Provider, [place] (Jess B)  I discussed the limitations of evaluation and management by telemedicine and the availability of in person appointments. The patient expressed understanding and agreed to proceed.  Subjective:   HPI:   Bilateral PNA- Daughter in law reports that his O2 levels are 92-93% on 1L whereas previously they were 97-98%.  Continues to have productive cough.  No fevers.  Pt reports feeling 'a little better'.  [facility] nurse reports bilateral crackles.  Difficulty swallowing- pt is now coughing 'every time he takes a swallow of a thin liquid'.  Less coughing w/ shakes.  Having difficulty swallowing 'any pill larger than tylenol'  'this is new in the last 10 days'.  ROS:   See pertinent positives and negatives per HPI.  Patient Active Problem List   Diagnosis Date Noted  . Urinary retention 05/30/2018  . Contusion of back   . Fall   . Acute respiratory failure with hypoxia ([place]) 05/27/2018  . Bilateral pneumonia 05/27/2018  . Syncope 05/26/2018  . Thyroid nodule 04/12/2018  . Physical exam 10/08/2014  . Facial skin lesion 04/08/2014  . Elevated WBCs 11/22/2013  . HTN (hypertension) 10/15/2013  . Osteoporosis 10/15/2013  . History of CVA (cerebrovascular accident) 10/15/2013  . History of compression fracture of spine 10/15/2013  . Recurrent pneumonia 10/15/2013  . Hyperlipidemia 10/15/2013  . Low testosterone 10/15/2013  . Carotid stenosis 10/15/2013    Social History   Tobacco Use  . Smoking status: Former Smoker    Packs/day: 2.00    Years: 20.00    Pack years: 40.00    Types: Cigarettes    Last attempt to quit: 02/29/1988    Years  since quitting: 30.3  . Smokeless tobacco: Never Used  Substance Use Topics  . Alcohol use: No    Alcohol/week: 0.0 standard drinks    Current Outpatient Medications:  .  acetaminophen (TYLENOL) 500 MG tablet, Take 500-1,000 mg by mouth every 8 (eight) hours as needed for mild pain or headache. , Disp: , Rfl:  .  alendronate (FOSAMAX) 70 MG tablet, Take 1 tablet (70 mg total) by mouth once a week. TAKE WITH A FULL GLASS OF WATER ON AT NOON TIME   EMPTY STOMACH. (Patient taking differently: Take 70 mg by mouth every Friday. take with a full glass of water on an empty stomach), Disp: 4 tablet, Rfl: 12 .  aspirin EC 81 MG tablet, Take 81 mg by mouth daily., Disp: , Rfl:  .  atenolol (TENORMIN) 25 MG tablet, Take by mouth daily., Disp: , Rfl:  .  atorvastatin (LIPITOR) 20 MG tablet, TAKE ONE TABLET BY MOUTH DAILY (Patient taking differently: Take 20 mg by mouth daily. ), Disp: 90 tablet, Rfl: 0 .  bisacodyl (DULCOLAX) 5 MG EC tablet, Take 5 mg by mouth daily as needed for mild constipation or moderate constipation., Disp: , Rfl:  .  Cholecalciferol (VITAMIN D-3) 1000 units CAPS, Take 1,000 Units by mouth daily. , Disp: , Rfl:  .  denosumab (PROLIA) 60 MG/ML SOSY injection, Inject 60 mg into the skin every 6 (six) months., Disp: , Rfl:  .  diphenhydramine-acetaminophen (TYLENOL PM) 25-500 MG TABS tablet, Take 1 tablet by mouth at  bedtime as needed (for sleep or pain)., Disp: , Rfl:  .  feeding supplement, ENSURE ENLIVE, (ENSURE ENLIVE) LIQD, Take 237 mLs by mouth 3 (three) times daily between meals., Disp: 237 mL, Rfl: 12 .  FLOVENT HFA 110 MCG/ACT inhaler, USE 2 INHALATIONS TWICE A DAY (Patient taking differently: Inhale 2 puffs into the lungs daily. ), Disp: 36 g, Rfl: 1 .  ipratropium (ATROVENT HFA) 17 MCG/ACT inhaler, Inhale 2 puffs into the lungs every 6 (six) hours as needed for wheezing., Disp: 3 Inhaler, Rfl: 3 .  lidocaine (LIDODERM) 5 %, Place 1 patch onto the skin daily. Remove & Discard  patch within 12 hours or as directed by MD, Disp: 30 patch, Rfl: 0 .  Multiple Vitamin (MULTIVITAMIN WITH MINERALS) TABS tablet, Take 1 tablet by mouth daily., Disp: 30 tablet, Rfl: 1 .  Polyethyl Glycol-Propyl Glycol (SYSTANE) 0.4-0.3 % SOLN, Place 1 drop into both eyes 3 (three) times daily as needed (dry eyes). , Disp: , Rfl:  .  arformoterol (BROVANA) 15 MCG/2ML NEBU, Take 2 mLs (15 mcg total) by nebulization 2 (two) times daily. (Patient not taking: Reported on 07/04/2018), Disp: 120 mL, Rfl: 0  No Known Allergies  Objective:   Pulse 89   Wt 135 lb (61.2 kg)   SpO2 93% Comment: on 1L of O2  BMI 18.31 kg/m   AAOx3, NAD Very frail NCAT, EOMI No obvious CN deficits Nasal cannula in place Coloring WNL Pt is able to speak clearly, coherently without shortness of breath or increased work of breathing.  Thought process is linear.  Mood is appropriate.   Assessment and Plan:   Bilateral PNA- pt was d/c'd from hospital and completed abx but HH still feels that he has bilateral basilar crackles.  Family reports that resting O2 sats are lower than last week despite being on 1L O2 via [place].  Will repeat CXR and determine if additional abx are required.  Dysphagia- new.  Pt has not had issues w/ swallowing until this recent hospitalization.  Now having difficulty w/ pills and thin liquids.  This could pose a risk of aspiration PNA.  Will need swallow study and will refer to Care Management to work w/ family/pharmacy to convert medications safely.   Annye Asa, MD 07/04/2018

## 2018-07-04 NOTE — Progress Notes (Signed)
I have discussed the procedure for the virtual visit with the patient who has given consent to proceed with assessment and treatment.   [name], CMA     

## 2018-07-05 ENCOUNTER — Other Ambulatory Visit: Payer: Self-pay

## 2018-07-05 ENCOUNTER — Ambulatory Visit (HOSPITAL_BASED_OUTPATIENT_CLINIC_OR_DEPARTMENT_OTHER)
Admission: RE | Admit: 2018-07-05 | Discharge: 2018-07-05 | Disposition: A | Discharge status: Home / Self Care | Payer: Medicare Other | Source: Ambulatory Visit | Attending: Family Medicine | Admitting: Family Medicine

## 2018-07-05 DIAGNOSIS — I2510 Atherosclerotic heart disease of native coronary artery without angina pectoris: Secondary | ICD-10-CM | POA: Diagnosis not present

## 2018-07-05 DIAGNOSIS — J189 Pneumonia, unspecified organism: Secondary | ICD-10-CM | POA: Insufficient documentation

## 2018-07-05 DIAGNOSIS — J449 Chronic obstructive pulmonary disease, unspecified: Secondary | ICD-10-CM | POA: Diagnosis not present

## 2018-07-05 DIAGNOSIS — J9601 Acute respiratory failure with hypoxia: Secondary | ICD-10-CM | POA: Diagnosis not present

## 2018-07-05 DIAGNOSIS — R1310 Dysphagia, unspecified: Secondary | ICD-10-CM | POA: Diagnosis not present

## 2018-07-05 DIAGNOSIS — Z8701 Personal history of pneumonia (recurrent): Secondary | ICD-10-CM | POA: Diagnosis not present

## 2018-07-05 DIAGNOSIS — W19XXXD Unspecified fall, subsequent encounter: Secondary | ICD-10-CM | POA: Diagnosis not present

## 2018-07-05 DIAGNOSIS — R05 Cough: Secondary | ICD-10-CM | POA: Diagnosis not present

## 2018-07-05 DIAGNOSIS — I10 Essential (primary) hypertension: Secondary | ICD-10-CM | POA: Diagnosis not present

## 2018-07-05 NOTE — Patient Outreach (Signed)
[facility]) Care Management  07/05/2018  Oral Remache Cunning 10-17-1931 [id number]   Referral Date: 07/05/2018 Referral Source: MD referral Referral Reason: Medication conversion   Outreach Attempt: spoke with son Joangel, [place].  He is able to verify HIPAA.  Discussed reason for referral. He states that his Dad is having problems with swallowing his medications.   Patient had telehealth visit with physician on yesterday and xray was ordered and ST asked to come back out.    Patient lives with son and his wife who are his primary caregivers. He states that patient was independent until this recent bout of pneumonia. He states he needs assistance with all aspects of care.  Son states that patient has home health through [place] with Nurse, PT, OT, and CNA.    Patient had been able to take medications with no problem but now is getting choked on medications and needs possible liquid medications.    Son states that patient executed Advanced Directives in the nursing facility and does not wish to be resuscitated.    Discussed [facility] services and support.  Son is agreeable to pharmacy at this time to assist with medication conversion.    Plan: RN CM will refer to pharmacy for assist with medication conversion.  RN CM will sign off case.     Jone Baseman, RN, MSN [facility] Care Management Care Management Coordinator Direct Line [phone] Toll Free: [phone]  Fax: [phone]

## 2018-07-06 ENCOUNTER — Telehealth: Payer: Self-pay | Admitting: Pharmacist

## 2018-07-06 DIAGNOSIS — I10 Essential (primary) hypertension: Secondary | ICD-10-CM | POA: Diagnosis not present

## 2018-07-06 DIAGNOSIS — J9601 Acute respiratory failure with hypoxia: Secondary | ICD-10-CM | POA: Diagnosis not present

## 2018-07-06 DIAGNOSIS — I2510 Atherosclerotic heart disease of native coronary artery without angina pectoris: Secondary | ICD-10-CM | POA: Diagnosis not present

## 2018-07-06 DIAGNOSIS — J449 Chronic obstructive pulmonary disease, unspecified: Secondary | ICD-10-CM | POA: Diagnosis not present

## 2018-07-06 DIAGNOSIS — Z8701 Personal history of pneumonia (recurrent): Secondary | ICD-10-CM | POA: Diagnosis not present

## 2018-07-06 DIAGNOSIS — W19XXXD Unspecified fall, subsequent encounter: Secondary | ICD-10-CM | POA: Diagnosis not present

## 2018-07-06 NOTE — Patient Outreach (Signed)
[facility]) Care Management  [place]   07/06/2018  ROD MAJERUS 1931/09/17 [id number]  Reason for referral: Medication Management  Referral source: Dr. [name] Current insurance: Unknown/Medicare  PMHx includes but not limited to:   Carotid stenosis,history of CVA, history of falls, HTN, hyperlipidemia, osteoporosis, and urinary retention.  Outreach:  Successful telephone call with patient and his daughter-in-law.  HIPAA identifiers verified.   Subjective:  Patient's daughter-in-law reported the patient was previously having trouble swallowing his multiple vitamin and calcium. She has solved this issue by purchasing a pill crusher. She reported crushing he patient's calcium and giving him gummy multiple vitamins.  Patient reports using a pill box as an adherence strategy.  Objective: Lab Results  Component Value Date   CREATININE 1.03 05/29/2018   CREATININE 0.70 05/28/2018   CREATININE 0.75 05/26/2018    Lab Results  Component Value Date   HGBA1C 6.4 06/02/2017    Lipid Panel     Component Value Date/Time   CHOL 108 04/12/2018 1029   TRIG 52.0 04/12/2018 1029   HDL 43.90 04/12/2018 1029   CHOLHDL 2 04/12/2018 1029   VLDL 10.4 04/12/2018 1029   LDLCALC 53 04/12/2018 1029    BP Readings from Last 3 Encounters:  06/26/18 140/90  05/31/18 100/71  04/12/18 118/60    No Known Allergies  Medications Reviewed Today    Reviewed by Elayne Guerin, [place] (Pharmacist) on 07/06/18 at 1644  Med List Status: <None>  Medication Order Taking? Sig Documenting Provider Last Dose Status Informant  acetaminophen (TYLENOL) 500 MG tablet [id number] Yes Take 500-1,000 mg by mouth every 8 (eight) hours as needed for mild pain or headache.  [provider] Taking Active Multiple Informants  alendronate (FOSAMAX) 70 MG tablet [id number] Yes Take 1 tablet (70 mg total) by mouth once a week. TAKE WITH A FULL GLASS OF WATER ON AT NOON TIME   EMPTY  STOMACH.  Patient taking differently:  Take 70 mg by mouth every Friday. take with a full glass of water on an empty stomach   Renato Shin, MD Taking Active Multiple Informants  aspirin EC 81 MG tablet [id number] Yes Take 81 mg by mouth daily. [provider] Taking Active Self  atenolol (TENORMIN) 25 MG tablet [id number] Yes Take 1 tablet (25 mg total) by mouth daily. [name], MD Taking Active   atorvastatin (LIPITOR) 20 MG tablet [id number] Yes TAKE ONE TABLET BY MOUTH DAILY  Patient taking differently:  Take 20 mg by mouth at bedtime.    [name], MD Taking Active Multiple Informants  Cholecalciferol (VITAMIN D-3) 1000 units CAPS [id number] Yes Take 1,000 Units by mouth 2 (two) times a day.  [provider] Taking Active Self  denosumab (PROLIA) 60 MG/ML SOSY injection [id number] Yes Inject 60 mg into the skin every 6 (six) months. [provider] Taking Active Multiple Informants           Med Note Quinn Axe May 27, 2018  9:47 PM) Next injection due May or June  feeding supplement, [address] [place]e [place]) [place] [id number] Yes Take 237 mLs by mouth 3 (three) times daily between meals. Radene Gunning, NP Taking Active   FLOVENT HFA 110 MCG/ACT inhaler [id number] Yes USE 2 INHALATIONS TWICE A DAY  Patient taking differently:  Inhale 2 puffs into the lungs 2 (two) times a day.    [name], MD Taking Active Multiple Informants  ipratropium (ATROVENT HFA)  17 MCG/ACT inhaler [id number] Yes Inhale 2 puffs into the lungs every 6 (six) hours as needed for wheezing. [name], MD Taking Active Multiple Informants  lidocaine (LIDODERM) 5 % [id number] Yes Place 1 patch onto the skin daily. Remove & Discard patch within 12 hours or as directed by MD Radene Gunning, NP Taking Active   Multiple Vitamin (MULTIVITAMIN WITH MINERALS) TABS tablet [id number] Yes Take 1 tablet by mouth daily. Radene Gunning, NP Taking Active    Polyethyl Glycol-Propyl Glycol (SYSTANE) 0.4-0.3 % SOLN [id number] Yes Place 1 drop into both eyes 3 (three) times daily as needed (dry eyes).  [provider] Taking Active Multiple Informants          Assessment:  Drugs sorted by system:  Neurologic/Psychologic:  Cardiovascular: Aspirin, Atenolol, Atorvastatin,   Pulmonary/Allergy: Flovent, Ipratropium HFA  Gastrointestinal: Ensure,  Endocrine: Alendronate, Prolia  Pain: Acetaminophen, Tramadol  Topical: Lidocaine  Genitourinary: Tamsulosin  Vitamins/Minerals/Supplements: Cholecalciferol, Multiple Vitamin,   Miscellaneous: Systane Eye Drops  Medication Review Findings:  Patient on both Fosamax and Prolia (From Dr. Virgil Benedict note from 04/12/2018:  "pt is seeing Dr Loanne Drilling and is currently on Fosamax, Vit D, and Prolia. Recent DEXA shows T score of -4.7 at R femoral neck. Pt denies bone aches or joint pains. No issues with jaw."  Note from 11/25/2018 Dr. Earnest Rosier was started in 2015, [place] in 2018; he has hypogonadism, but rx of this is precluded by BPH; CXR has shown vertebral compression fractures; last DEXA in 2018: worse T-score was -4.7). heartburn is mild. This is a stable problem.  Swallowing issue- the patient's daughter-in-law has a pill crusher and has been crushing his calcium tablet which was the hardest for him to swallow. She reported he was able to swallow all of his other medications.  None of his medications are on the "do not crush list" so if necessary, they could all be crushed and put in applesauce or pudding.    Recommended Vitafusion, Alive, or Nature's Made gummy Calcium as an option instead of crushing the calcium tablets.  Reviewed the dosing precautions with Alendronate since the patient reported swallowing issues.   Plan: . Will route note to PCP.  [name] Will follow-up in 2-3 weeks.. to see if they were able to get the Calcium gummies and to follow up on the swallowing  issues.

## 2018-07-10 DIAGNOSIS — Z8701 Personal history of pneumonia (recurrent): Secondary | ICD-10-CM | POA: Diagnosis not present

## 2018-07-10 DIAGNOSIS — W19XXXD Unspecified fall, subsequent encounter: Secondary | ICD-10-CM | POA: Diagnosis not present

## 2018-07-10 DIAGNOSIS — J9601 Acute respiratory failure with hypoxia: Secondary | ICD-10-CM | POA: Diagnosis not present

## 2018-07-10 DIAGNOSIS — I2510 Atherosclerotic heart disease of native coronary artery without angina pectoris: Secondary | ICD-10-CM | POA: Diagnosis not present

## 2018-07-10 DIAGNOSIS — I10 Essential (primary) hypertension: Secondary | ICD-10-CM | POA: Diagnosis not present

## 2018-07-10 DIAGNOSIS — J449 Chronic obstructive pulmonary disease, unspecified: Secondary | ICD-10-CM | POA: Diagnosis not present

## 2018-07-11 DIAGNOSIS — I10 Essential (primary) hypertension: Secondary | ICD-10-CM | POA: Diagnosis not present

## 2018-07-11 DIAGNOSIS — Z8701 Personal history of pneumonia (recurrent): Secondary | ICD-10-CM | POA: Diagnosis not present

## 2018-07-11 DIAGNOSIS — I2510 Atherosclerotic heart disease of native coronary artery without angina pectoris: Secondary | ICD-10-CM | POA: Diagnosis not present

## 2018-07-11 DIAGNOSIS — J449 Chronic obstructive pulmonary disease, unspecified: Secondary | ICD-10-CM | POA: Diagnosis not present

## 2018-07-11 DIAGNOSIS — W19XXXD Unspecified fall, subsequent encounter: Secondary | ICD-10-CM | POA: Diagnosis not present

## 2018-07-11 DIAGNOSIS — J9601 Acute respiratory failure with hypoxia: Secondary | ICD-10-CM | POA: Diagnosis not present

## 2018-07-13 DIAGNOSIS — I2510 Atherosclerotic heart disease of native coronary artery without angina pectoris: Secondary | ICD-10-CM | POA: Diagnosis not present

## 2018-07-13 DIAGNOSIS — Z8701 Personal history of pneumonia (recurrent): Secondary | ICD-10-CM | POA: Diagnosis not present

## 2018-07-13 DIAGNOSIS — J449 Chronic obstructive pulmonary disease, unspecified: Secondary | ICD-10-CM | POA: Diagnosis not present

## 2018-07-13 DIAGNOSIS — J9601 Acute respiratory failure with hypoxia: Secondary | ICD-10-CM | POA: Diagnosis not present

## 2018-07-13 DIAGNOSIS — W19XXXD Unspecified fall, subsequent encounter: Secondary | ICD-10-CM | POA: Diagnosis not present

## 2018-07-13 DIAGNOSIS — I10 Essential (primary) hypertension: Secondary | ICD-10-CM | POA: Diagnosis not present

## 2018-07-17 ENCOUNTER — Telehealth: Payer: Self-pay | Admitting: Family Medicine

## 2018-07-17 DIAGNOSIS — I10 Essential (primary) hypertension: Secondary | ICD-10-CM | POA: Diagnosis not present

## 2018-07-17 DIAGNOSIS — I2510 Atherosclerotic heart disease of native coronary artery without angina pectoris: Secondary | ICD-10-CM | POA: Diagnosis not present

## 2018-07-17 DIAGNOSIS — W19XXXD Unspecified fall, subsequent encounter: Secondary | ICD-10-CM | POA: Diagnosis not present

## 2018-07-17 DIAGNOSIS — J449 Chronic obstructive pulmonary disease, unspecified: Secondary | ICD-10-CM | POA: Diagnosis not present

## 2018-07-17 DIAGNOSIS — Z8701 Personal history of pneumonia (recurrent): Secondary | ICD-10-CM | POA: Diagnosis not present

## 2018-07-17 DIAGNOSIS — J9601 Acute respiratory failure with hypoxia: Secondary | ICD-10-CM | POA: Diagnosis not present

## 2018-07-17 NOTE — Telephone Encounter (Signed)
Caller with [facility] asking for verbal orders to wean pt off of oxygen, if pt drops below 90 they will put him back on oxygen again

## 2018-07-17 NOTE — Telephone Encounter (Signed)
Called and verbal ok given on secure voicemail.

## 2018-07-17 NOTE — Telephone Encounter (Signed)
[place] for orders? 

## 2018-07-17 NOTE — Telephone Encounter (Signed)
Ok for verbal orders ?

## 2018-07-18 DIAGNOSIS — Z8701 Personal history of pneumonia (recurrent): Secondary | ICD-10-CM | POA: Diagnosis not present

## 2018-07-18 DIAGNOSIS — I10 Essential (primary) hypertension: Secondary | ICD-10-CM | POA: Diagnosis not present

## 2018-07-18 DIAGNOSIS — W19XXXD Unspecified fall, subsequent encounter: Secondary | ICD-10-CM | POA: Diagnosis not present

## 2018-07-18 DIAGNOSIS — J449 Chronic obstructive pulmonary disease, unspecified: Secondary | ICD-10-CM | POA: Diagnosis not present

## 2018-07-18 DIAGNOSIS — J9601 Acute respiratory failure with hypoxia: Secondary | ICD-10-CM | POA: Diagnosis not present

## 2018-07-18 DIAGNOSIS — I2510 Atherosclerotic heart disease of native coronary artery without angina pectoris: Secondary | ICD-10-CM | POA: Diagnosis not present

## 2018-07-20 ENCOUNTER — Other Ambulatory Visit: Payer: Self-pay | Admitting: Pharmacist

## 2018-07-20 ENCOUNTER — Ambulatory Visit: Payer: Self-pay | Admitting: Pharmacist

## 2018-07-20 NOTE — Patient Outreach (Addendum)
[facility]) Care Management  07/20/2018  Kayman Snuffer Mcclay 08/09/1931 [id number]   Patient was called regarding the issues he was having with pill-splitting. Unfortunately, he did not answer the phone. HIPAA compliant message was left on his voicemail.  Plan: Send patient an unsuccessful outreach letter. Call patient back in 2-3 weeks.  Elayne Guerin, PharmD, BCACP [facility] Clinical Pharmacist [phone]   ADDENDUM  Patient's Daughter-in-Law called me back. HIPAA identifiers were obtained. She reported they were able to find gummy calcium plus vitamin D and gummy multiple vitamins as suggested during our previous call. Patient is able to swallow all other tablets without difficulty.  Plan: Close patient's pharmacy case. Send closure letters to patient and PCP.   Elayne Guerin, PharmD, [place] Clinical Pharmacist [phone]

## 2018-07-22 DIAGNOSIS — W19XXXD Unspecified fall, subsequent encounter: Secondary | ICD-10-CM | POA: Diagnosis not present

## 2018-07-22 DIAGNOSIS — Z8701 Personal history of pneumonia (recurrent): Secondary | ICD-10-CM | POA: Diagnosis not present

## 2018-07-22 DIAGNOSIS — Z9981 Dependence on supplemental oxygen: Secondary | ICD-10-CM | POA: Diagnosis not present

## 2018-07-22 DIAGNOSIS — Z8673 Personal history of transient ischemic attack (TIA), and cerebral infarction without residual deficits: Secondary | ICD-10-CM | POA: Diagnosis not present

## 2018-07-22 DIAGNOSIS — J9601 Acute respiratory failure with hypoxia: Secondary | ICD-10-CM | POA: Diagnosis not present

## 2018-07-22 DIAGNOSIS — Z87891 Personal history of nicotine dependence: Secondary | ICD-10-CM | POA: Diagnosis not present

## 2018-07-22 DIAGNOSIS — I10 Essential (primary) hypertension: Secondary | ICD-10-CM | POA: Diagnosis not present

## 2018-07-22 DIAGNOSIS — Z7982 Long term (current) use of aspirin: Secondary | ICD-10-CM | POA: Diagnosis not present

## 2018-07-22 DIAGNOSIS — I2510 Atherosclerotic heart disease of native coronary artery without angina pectoris: Secondary | ICD-10-CM | POA: Diagnosis not present

## 2018-07-22 DIAGNOSIS — J449 Chronic obstructive pulmonary disease, unspecified: Secondary | ICD-10-CM | POA: Diagnosis not present

## 2018-07-24 DIAGNOSIS — Z8701 Personal history of pneumonia (recurrent): Secondary | ICD-10-CM | POA: Diagnosis not present

## 2018-07-24 DIAGNOSIS — I10 Essential (primary) hypertension: Secondary | ICD-10-CM | POA: Diagnosis not present

## 2018-07-24 DIAGNOSIS — J449 Chronic obstructive pulmonary disease, unspecified: Secondary | ICD-10-CM | POA: Diagnosis not present

## 2018-07-24 DIAGNOSIS — W19XXXD Unspecified fall, subsequent encounter: Secondary | ICD-10-CM | POA: Diagnosis not present

## 2018-07-24 DIAGNOSIS — I2510 Atherosclerotic heart disease of native coronary artery without angina pectoris: Secondary | ICD-10-CM | POA: Diagnosis not present

## 2018-07-24 DIAGNOSIS — J9601 Acute respiratory failure with hypoxia: Secondary | ICD-10-CM | POA: Diagnosis not present

## 2018-07-25 ENCOUNTER — Telehealth: Payer: Self-pay

## 2018-07-25 DIAGNOSIS — I10 Essential (primary) hypertension: Secondary | ICD-10-CM | POA: Diagnosis not present

## 2018-07-25 DIAGNOSIS — W19XXXD Unspecified fall, subsequent encounter: Secondary | ICD-10-CM | POA: Diagnosis not present

## 2018-07-25 DIAGNOSIS — J449 Chronic obstructive pulmonary disease, unspecified: Secondary | ICD-10-CM | POA: Diagnosis not present

## 2018-07-25 DIAGNOSIS — I2510 Atherosclerotic heart disease of native coronary artery without angina pectoris: Secondary | ICD-10-CM | POA: Diagnosis not present

## 2018-07-25 DIAGNOSIS — J9601 Acute respiratory failure with hypoxia: Secondary | ICD-10-CM | POA: Diagnosis not present

## 2018-07-25 DIAGNOSIS — Z8701 Personal history of pneumonia (recurrent): Secondary | ICD-10-CM | POA: Diagnosis not present

## 2018-07-25 NOTE — Telephone Encounter (Signed)
Called and left a detailed message to have pt begin weaning off of O2 per PCP.

## 2018-07-25 NOTE — Telephone Encounter (Signed)
[place] for orders? 

## 2018-07-25 NOTE — Telephone Encounter (Signed)
Princess with [facility] called asking if orders can be faxed in giving the OK for Mr. [name] to begin weaning off his oxygen. He is currently at 1 Liter with O2 stats 94-99%. Princess stated that this is for Mr. [name] to reach his goal of coming off his supplemental oxygen completely.  Call back # [phone] Fax # [phone]

## 2018-07-30 ENCOUNTER — Other Ambulatory Visit: Payer: Self-pay | Admitting: Family Medicine

## 2018-07-30 DIAGNOSIS — I2510 Atherosclerotic heart disease of native coronary artery without angina pectoris: Secondary | ICD-10-CM | POA: Diagnosis not present

## 2018-07-30 DIAGNOSIS — J449 Chronic obstructive pulmonary disease, unspecified: Secondary | ICD-10-CM | POA: Diagnosis not present

## 2018-07-30 DIAGNOSIS — I10 Essential (primary) hypertension: Secondary | ICD-10-CM | POA: Diagnosis not present

## 2018-07-30 DIAGNOSIS — R1310 Dysphagia, unspecified: Secondary | ICD-10-CM

## 2018-07-30 DIAGNOSIS — W19XXXD Unspecified fall, subsequent encounter: Secondary | ICD-10-CM | POA: Diagnosis not present

## 2018-07-30 DIAGNOSIS — Z8701 Personal history of pneumonia (recurrent): Secondary | ICD-10-CM | POA: Diagnosis not present

## 2018-07-30 DIAGNOSIS — J189 Pneumonia, unspecified organism: Secondary | ICD-10-CM

## 2018-07-30 DIAGNOSIS — J9601 Acute respiratory failure with hypoxia: Secondary | ICD-10-CM | POA: Diagnosis not present

## 2018-08-01 ENCOUNTER — Other Ambulatory Visit: Payer: Self-pay | Admitting: Family Medicine

## 2018-08-06 DIAGNOSIS — W19XXXD Unspecified fall, subsequent encounter: Secondary | ICD-10-CM | POA: Diagnosis not present

## 2018-08-06 DIAGNOSIS — J449 Chronic obstructive pulmonary disease, unspecified: Secondary | ICD-10-CM | POA: Diagnosis not present

## 2018-08-06 DIAGNOSIS — J9601 Acute respiratory failure with hypoxia: Secondary | ICD-10-CM | POA: Diagnosis not present

## 2018-08-06 DIAGNOSIS — Z8701 Personal history of pneumonia (recurrent): Secondary | ICD-10-CM | POA: Diagnosis not present

## 2018-08-06 DIAGNOSIS — I10 Essential (primary) hypertension: Secondary | ICD-10-CM | POA: Diagnosis not present

## 2018-08-06 DIAGNOSIS — I2510 Atherosclerotic heart disease of native coronary artery without angina pectoris: Secondary | ICD-10-CM | POA: Diagnosis not present

## 2018-08-08 ENCOUNTER — Encounter: Payer: Self-pay | Admitting: Family Medicine

## 2018-08-09 ENCOUNTER — Other Ambulatory Visit: Payer: Self-pay | Admitting: General Practice

## 2018-08-09 DIAGNOSIS — J189 Pneumonia, unspecified organism: Secondary | ICD-10-CM

## 2018-08-09 DIAGNOSIS — R1310 Dysphagia, unspecified: Secondary | ICD-10-CM

## 2018-08-10 ENCOUNTER — Other Ambulatory Visit (HOSPITAL_COMMUNITY): Payer: Self-pay

## 2018-08-10 ENCOUNTER — Ambulatory Visit: Payer: Self-pay | Admitting: Pharmacist

## 2018-08-10 DIAGNOSIS — R1310 Dysphagia, unspecified: Secondary | ICD-10-CM

## 2018-08-13 ENCOUNTER — Telehealth: Payer: Self-pay

## 2018-08-13 DIAGNOSIS — J9601 Acute respiratory failure with hypoxia: Secondary | ICD-10-CM | POA: Diagnosis not present

## 2018-08-13 DIAGNOSIS — I10 Essential (primary) hypertension: Secondary | ICD-10-CM | POA: Diagnosis not present

## 2018-08-13 DIAGNOSIS — W19XXXD Unspecified fall, subsequent encounter: Secondary | ICD-10-CM | POA: Diagnosis not present

## 2018-08-13 DIAGNOSIS — J449 Chronic obstructive pulmonary disease, unspecified: Secondary | ICD-10-CM | POA: Diagnosis not present

## 2018-08-13 DIAGNOSIS — I2510 Atherosclerotic heart disease of native coronary artery without angina pectoris: Secondary | ICD-10-CM | POA: Diagnosis not present

## 2018-08-13 DIAGNOSIS — Z8701 Personal history of pneumonia (recurrent): Secondary | ICD-10-CM | POA: Diagnosis not present

## 2018-08-13 NOTE — Telephone Encounter (Signed)
Patient verified for Prolia got instant Summary of Benefits back waiting to get out-of-pocket cost

## 2018-08-15 ENCOUNTER — Encounter: Payer: Self-pay | Admitting: Endocrinology

## 2018-08-15 ENCOUNTER — Encounter: Payer: Self-pay | Admitting: Family Medicine

## 2018-08-15 ENCOUNTER — Telehealth: Payer: Self-pay | Admitting: Family Medicine

## 2018-08-15 ENCOUNTER — Ambulatory Visit (INDEPENDENT_AMBULATORY_CARE_PROVIDER_SITE_OTHER): Payer: Medicare Other | Admitting: Family Medicine

## 2018-08-15 ENCOUNTER — Other Ambulatory Visit: Payer: Self-pay | Admitting: Endocrinology

## 2018-08-15 ENCOUNTER — Other Ambulatory Visit: Payer: Self-pay | Admitting: Family Medicine

## 2018-08-15 DIAGNOSIS — J3489 Other specified disorders of nose and nasal sinuses: Secondary | ICD-10-CM | POA: Diagnosis not present

## 2018-08-15 MED ORDER — CEPHALEXIN 500 MG PO CAPS: 500.0000 mg | ORAL_CAPSULE | Freq: Three times a day (TID) | ORAL | 0 refills | Status: AC

## 2018-08-15 MED ORDER — FLOVENT HFA 110 MCG/ACT IN AERO: INHALATION_SPRAY | RESPIRATORY_TRACT | 3 refills | Status: DC

## 2018-08-15 NOTE — Telephone Encounter (Signed)
This should be a Virtual visit to evaluate the sinus infection, correct?    Copied from [place] [phone]. Topic: Appointment Scheduling - Scheduling Inquiry for Clinic >> Aug 14, 2018  5:07 PM Alanda Slim E wrote: Reason for CRM: Pt is in need of an appt for sinus infection and pressure / please advise asap / call son to schedule

## 2018-08-15 NOTE — Telephone Encounter (Signed)
Please advise if refill is appropriate 

## 2018-08-15 NOTE — Progress Notes (Signed)
Virtual Visit via Video   I connected with patient on 08/15/18 at  4:00 PM EDT by a video enabled telemedicine application and verified that I am speaking with the correct person using two identifiers.  Location patient: Home Location provider: [facility]up[facility]ist, Office Persons participating in the virtual visit: Patient, Provider  I discussed the limitations of evaluation and management by telemedicine and the availability of in person appointments. The patient expressed understanding and agreed to proceed.  Subjective:   HPI:   Nasal pain- R side of nose.  Denies redness.  No HA.  Pain is interior.  Continues to use topical abx ointment.  Pt reports pain started 'a couple of weeks ago'.  Improved somewhat w/ abx ointment but continues to have pain.  + nasal congestion.  ROS:   See pertinent positives and negatives per HPI.  Patient Active Problem List   Diagnosis Date Noted  . Urinary retention 05/30/2018  . Contusion of back   . Fall   . Bilateral pneumonia 05/27/2018  . Syncope 05/26/2018  . Thyroid nodule 04/12/2018  . Physical exam 10/08/2014  . Facial skin lesion 04/08/2014  . Elevated WBCs 11/22/2013  . HTN (hypertension) 10/15/2013  . Osteoporosis 10/15/2013  . History of CVA (cerebrovascular accident) 10/15/2013  . History of compression fracture of spine 10/15/2013  . Recurrent pneumonia 10/15/2013  . Hyperlipidemia 10/15/2013  . Low testosterone 10/15/2013  . Carotid stenosis 10/15/2013    Social History   Tobacco Use  . Smoking status: Former Smoker    Packs/day: 2.00    Years: 20.00    Pack years: 40.00    Types: Cigarettes    Quit date: 02/29/1988    Years since quitting: 30.4  . Smokeless tobacco: Never Used  Substance Use Topics  . Alcohol use: No    Alcohol/week: 0.0 standard drinks    Current Outpatient Medications:  .  acetaminophen (TYLENOL) 500 MG tablet, Take 500-1,000 mg by mouth every 8 (eight) hours as needed for mild pain or  headache. , Disp: , Rfl:  .  [START ON 08/17/2018] alendronate (FOSAMAX) 70 MG tablet, Take 1 tablet (70 mg total) by mouth every Friday. take with a full glass of water on an empty stomach, Disp: 12 tablet, Rfl: 3 .  aspirin EC 81 MG tablet, Take 81 mg by mouth daily., Disp: , Rfl:  .  atenolol (TENORMIN) 25 MG tablet, Take 1 tablet (25 mg total) by mouth daily., Disp: 30 tablet, Rfl: 3 .  atorvastatin (LIPITOR) 20 MG tablet, TAKE ONE TABLET BY MOUTH DAILY (Patient taking differently: Take 20 mg by mouth at bedtime. ), Disp: 90 tablet, Rfl: 0 .  Cholecalciferol (VITAMIN D-3) 1000 units CAPS, Take 1,000 Units by mouth 2 (two) times a day. , Disp: , Rfl:  .  denosumab (PROLIA) 60 MG/ML SOSY injection, Inject 60 mg into the skin every 6 (six) months., Disp: , Rfl:  .  feeding supplement, ENSURE ENLIVE, (ENSURE ENLIVE) LIQD, Take 237 mLs by mouth 3 (three) times daily between meals., Disp: 237 mL, Rfl: 12 .  FLOVENT HFA 110 MCG/ACT inhaler, USE 2 INHALATIONS TWICE A DAY, Disp: 36 g, Rfl: 3 .  ipratropium (ATROVENT HFA) 17 MCG/ACT inhaler, Inhale 2 puffs into the lungs every 6 (six) hours as needed for wheezing., Disp: 3 Inhaler, Rfl: 3 .  lidocaine (LIDODERM) 5 %, Place 1 patch onto the skin daily. Remove & Discard patch within 12 hours or as directed by MD, Disp: 30 patch, Rfl:  0 .  Multiple Vitamin (MULTIVITAMIN WITH MINERALS) TABS tablet, Take 1 tablet by mouth daily., Disp: 30 tablet, Rfl: 1 .  Polyethyl Glycol-Propyl Glycol (SYSTANE) 0.4-0.3 % SOLN, Place 1 drop into both eyes 3 (three) times daily as needed (dry eyes). , Disp: , Rfl:  .  tamsulosin (FLOMAX) 0.4 MG CAPS capsule, TAKE ONE CAPSULE BY MOUTH DAILY, Disp: 90 capsule, Rfl: 0 .  traMADol (ULTRAM) 50 MG tablet, Take 50 mg by mouth every 6 (six) hours as needed., Disp: , Rfl:   No Known Allergies  Objective:   There were no vitals taken for this visit.  AAOx3, NAD NCAT, EOMI No obvious CN deficits Coloring WNL Pt is able to speak  clearly, coherently without shortness of breath or increased work of breathing.  Thought process is linear.  Mood is appropriate.   Assessment and Plan:   Nasal sores- pt has hx of this.  Using topical ointment w/ some relief.  Will add Keflex to improve what could be cellulitis (unable to see from video).  Pt expressed understanding and is in agreement w/ plan.    Annye Asa, MD 08/15/2018

## 2018-08-15 NOTE — Telephone Encounter (Signed)
Ok for virtual visit

## 2018-08-15 NOTE — Telephone Encounter (Signed)
Please advise if refill request is appropriate 

## 2018-08-16 ENCOUNTER — Ambulatory Visit (HOSPITAL_COMMUNITY)
Admission: RE | Admit: 2018-08-16 | Discharge: 2018-08-16 | Disposition: A | Discharge status: Home / Self Care | Payer: Medicare Other | Source: Ambulatory Visit | Attending: Family Medicine | Admitting: Family Medicine

## 2018-08-16 ENCOUNTER — Other Ambulatory Visit: Payer: Self-pay

## 2018-08-16 DIAGNOSIS — R05 Cough: Secondary | ICD-10-CM | POA: Diagnosis not present

## 2018-08-16 DIAGNOSIS — R1310 Dysphagia, unspecified: Secondary | ICD-10-CM | POA: Insufficient documentation

## 2018-08-16 DIAGNOSIS — J189 Pneumonia, unspecified organism: Secondary | ICD-10-CM

## 2018-08-16 DIAGNOSIS — K219 Gastro-esophageal reflux disease without esophagitis: Secondary | ICD-10-CM | POA: Diagnosis not present

## 2018-08-21 ENCOUNTER — Encounter: Payer: Self-pay | Admitting: Family Medicine

## 2018-08-22 ENCOUNTER — Encounter: Payer: Self-pay | Admitting: General Practice

## 2018-09-03 ENCOUNTER — Encounter: Payer: Self-pay | Admitting: *Deleted

## 2018-09-03 ENCOUNTER — Other Ambulatory Visit: Payer: Self-pay | Admitting: Family Medicine

## 2018-09-14 ENCOUNTER — Telehealth: Payer: Self-pay

## 2018-09-14 NOTE — Telephone Encounter (Signed)
LVM making patient aware he is due for Prolia injection-he owes $0 and is ready to be scheduled any time for nurse visit

## 2018-09-18 ENCOUNTER — Inpatient Hospital Stay (HOSPITAL_COMMUNITY)
Admission: EM | Admit: 2018-09-18 | Discharge: 2018-09-21 | DRG: 871 | Disposition: A | Discharge status: Home Health | Payer: Medicare Other | Attending: Internal Medicine | Admitting: Internal Medicine

## 2018-09-18 ENCOUNTER — Encounter (HOSPITAL_COMMUNITY): Payer: Self-pay

## 2018-09-18 ENCOUNTER — Other Ambulatory Visit: Payer: Self-pay

## 2018-09-18 ENCOUNTER — Emergency Department (HOSPITAL_COMMUNITY): Payer: Medicare Other

## 2018-09-18 DIAGNOSIS — Z79899 Other long term (current) drug therapy: Secondary | ICD-10-CM

## 2018-09-18 DIAGNOSIS — M810 Age-related osteoporosis without current pathological fracture: Secondary | ICD-10-CM | POA: Diagnosis present

## 2018-09-18 DIAGNOSIS — K228 Other specified diseases of esophagus: Secondary | ICD-10-CM | POA: Diagnosis present

## 2018-09-18 DIAGNOSIS — Z209 Contact with and (suspected) exposure to unspecified communicable disease: Secondary | ICD-10-CM | POA: Diagnosis not present

## 2018-09-18 DIAGNOSIS — Z1159 Encounter for screening for other viral diseases: Secondary | ICD-10-CM | POA: Diagnosis not present

## 2018-09-18 DIAGNOSIS — R0902 Hypoxemia: Secondary | ICD-10-CM | POA: Diagnosis not present

## 2018-09-18 DIAGNOSIS — R1310 Dysphagia, unspecified: Secondary | ICD-10-CM

## 2018-09-18 DIAGNOSIS — J690 Pneumonitis due to inhalation of food and vomit: Secondary | ICD-10-CM | POA: Diagnosis present

## 2018-09-18 DIAGNOSIS — M19041 Primary osteoarthritis, right hand: Secondary | ICD-10-CM | POA: Diagnosis present

## 2018-09-18 DIAGNOSIS — Z7951 Long term (current) use of inhaled steroids: Secondary | ICD-10-CM

## 2018-09-18 DIAGNOSIS — E785 Hyperlipidemia, unspecified: Secondary | ICD-10-CM | POA: Diagnosis not present

## 2018-09-18 DIAGNOSIS — Z87891 Personal history of nicotine dependence: Secondary | ICD-10-CM

## 2018-09-18 DIAGNOSIS — I2510 Atherosclerotic heart disease of native coronary artery without angina pectoris: Secondary | ICD-10-CM | POA: Diagnosis present

## 2018-09-18 DIAGNOSIS — Z8673 Personal history of transient ischemic attack (TIA), and cerebral infarction without residual deficits: Secondary | ICD-10-CM

## 2018-09-18 DIAGNOSIS — I10 Essential (primary) hypertension: Secondary | ICD-10-CM | POA: Diagnosis present

## 2018-09-18 DIAGNOSIS — J9601 Acute respiratory failure with hypoxia: Secondary | ICD-10-CM | POA: Diagnosis present

## 2018-09-18 DIAGNOSIS — I959 Hypotension, unspecified: Secondary | ICD-10-CM | POA: Diagnosis not present

## 2018-09-18 DIAGNOSIS — R1314 Dysphagia, pharyngoesophageal phase: Secondary | ICD-10-CM | POA: Diagnosis present

## 2018-09-18 DIAGNOSIS — K000 Anodontia: Secondary | ICD-10-CM | POA: Diagnosis present

## 2018-09-18 DIAGNOSIS — K8020 Calculus of gallbladder without cholecystitis without obstruction: Secondary | ICD-10-CM | POA: Diagnosis not present

## 2018-09-18 DIAGNOSIS — J432 Centrilobular emphysema: Secondary | ICD-10-CM | POA: Diagnosis present

## 2018-09-18 DIAGNOSIS — M19042 Primary osteoarthritis, left hand: Secondary | ICD-10-CM | POA: Diagnosis present

## 2018-09-18 DIAGNOSIS — R531 Weakness: Secondary | ICD-10-CM | POA: Diagnosis not present

## 2018-09-18 DIAGNOSIS — Z86008 Personal history of in-situ neoplasm of other site: Secondary | ICD-10-CM

## 2018-09-18 DIAGNOSIS — R1313 Dysphagia, pharyngeal phase: Secondary | ICD-10-CM | POA: Diagnosis present

## 2018-09-18 DIAGNOSIS — Z8249 Family history of ischemic heart disease and other diseases of the circulatory system: Secondary | ICD-10-CM

## 2018-09-18 DIAGNOSIS — R6520 Severe sepsis without septic shock: Secondary | ICD-10-CM | POA: Diagnosis not present

## 2018-09-18 DIAGNOSIS — J189 Pneumonia, unspecified organism: Secondary | ICD-10-CM

## 2018-09-18 DIAGNOSIS — R000 Tachycardia, unspecified: Secondary | ICD-10-CM | POA: Diagnosis not present

## 2018-09-18 DIAGNOSIS — Z7982 Long term (current) use of aspirin: Secondary | ICD-10-CM

## 2018-09-18 DIAGNOSIS — Z8701 Personal history of pneumonia (recurrent): Secondary | ICD-10-CM

## 2018-09-18 DIAGNOSIS — A419 Sepsis, unspecified organism: Secondary | ICD-10-CM | POA: Diagnosis not present

## 2018-09-18 DIAGNOSIS — R0602 Shortness of breath: Secondary | ICD-10-CM | POA: Diagnosis not present

## 2018-09-18 MED ORDER — VANCOMYCIN HCL IN DEXTROSE 1-5 GM/200ML-% IV SOLN: 1000.0000 mg | Freq: Once | INTRAVENOUS | Status: DC

## 2018-09-18 MED ORDER — METRONIDAZOLE IN NACL 5-0.79 MG/ML-% IV SOLN
500.0000 mg | Freq: Once | INTRAVENOUS | Status: AC
  Administered 2018-09-19: 500 mg via INTRAVENOUS
  Filled 2018-09-18: qty 100

## 2018-09-18 MED ORDER — SODIUM CHLORIDE 0.9 % IV SOLN
2.0000 g | Freq: Once | INTRAVENOUS | Status: AC
  Administered 2018-09-19: 2 g via INTRAVENOUS
  Filled 2018-09-18: qty 2

## 2018-09-18 MED ORDER — SODIUM CHLORIDE 0.9 % IV BOLUS
1000.0000 mL | Freq: Once | INTRAVENOUS | Status: AC
  Administered 2018-09-19: 1000 mL via INTRAVENOUS

## 2018-09-18 MED ORDER — VANCOMYCIN HCL 10 G IV SOLR
1250.0000 mg | Freq: Once | INTRAVENOUS | Status: AC
  Administered 2018-09-19: 02:00:00 1250 mg via INTRAVENOUS
  Filled 2018-09-18: qty 1250

## 2018-09-18 NOTE — Progress Notes (Signed)
A consult was received from an ED physician for cefepime and vancomycin per pharmacy dosing.  The patient's profile has been reviewed for ht/wt/allergies/indication/available labs.   A one time order has been placed for cefepime 2 Gm and vancomycin 1250 mg.  Further antibiotics/pharmacy consults should be ordered by admitting physician if indicated.                       Thank you, Dorrene German 09/18/2018  11:55 PM

## 2018-09-18 NOTE — ED Provider Notes (Addendum)
Same  [facility] DEPT Provider Note   CSN: [id number] Arrival date & time: 09/18/18  2314     History   Chief Complaint Chief Complaint  Patient presents with  . Shortness of Breath    HPI [name] is a 85 y.o. male.     Patient here by EMS with generalized weakness and onset around 8 PM tonight.  States he suddenly felt weak all over and is able to walk for his baseline.  Family reports fever at home to 101.  He endorses some shortness of breath and chills.  He complains of no chest pain or shortness of breath.  He is found to be febrile and hypotensive for EMS.  Initial blood pressure was 80 systolic.  He was saturating 80% on room air.  He has not had any vomiting, chills, diarrhea.  No pain with urination or blood in urine.  No sick contacts at home.  History of COPD, previous stroke, hypertension, hyperlipidemia.  The history is provided by the patient and the EMS personnel. The history is limited by the condition of the patient.  Shortness of Breath Associated symptoms: no abdominal pain, no chest pain, no fever, no rash and no vomiting     Past Medical History:  Diagnosis Date  . Arthritis    hands   . BPH (benign prostatic hyperplasia)   . COPD (chronic obstructive pulmonary disease) ([place])   . Coronary artery disease   . CVA (cerebral infarction) 3 years ago   mild stroke  . HLD (hyperlipidemia)   . Hypertension   . Osteoporosis   . Pneumonia   . 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 SCC (squamous cell carcinoma) in situ 12/12/2017   left cheek inf.  . 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 Urinary retention 05/30/2018  . Contusion of back   . Fall   . Bilateral pneumonia 05/27/2018  . Syncope 05/26/2018  . Thyroid nodule 04/12/2018  . Physical exam 10/08/2014  . Facial skin lesion 04/08/2014  .  Elevated WBCs 11/22/2013  . HTN (hypertension) 10/15/2013  . Osteoporosis 10/15/2013  . History of CVA (cerebrovascular accident) 10/15/2013  . History of compression fracture of spine 10/15/2013  . Recurrent pneumonia 10/15/2013  . Hyperlipidemia 10/15/2013  . Low testosterone 10/15/2013  . Carotid stenosis 10/15/2013    Past Surgical History:  Procedure Laterality Date  . BACK SURGERY    . LAPAROSCOPIC APPENDECTOMY N/A 11/02/2013   Procedure: APPENDECTOMY LAPAROSCOPIC;  Surgeon: Donnie Mesa, MD;  Location: [facility];  Service: General;  Laterality: N/A;        Home Medications    Prior to Admission medications   Medication Sig Start Date End Date Taking? Authorizing Provider  acetaminophen (TYLENOL) 500 MG tablet Take 500-1,000 mg by mouth every 8 (eight) hours as needed for mild pain or headache.     [provider]  alendronate (FOSAMAX) 70 MG tablet Take 1 tablet (70 mg total) by mouth every Friday. take with a full glass of water on an empty stomach 08/17/18   Renato Shin, MD  aspirin EC 81 MG tablet Take 81 mg by mouth daily.    [provider]  atenolol (TENORMIN) 25 MG tablet Take 1 tablet (25 mg total) by mouth daily. 07/04/18   [name], MD  atorvastatin (LIPITOR) 20 MG tablet TAKE ONE TABLET BY MOUTH DAILY 09/03/18   [name], MD  Cholecalciferol (VITAMIN D-3) 1000 units CAPS Take 1,000 Units by mouth 2 (two) times a day.     [provider]  denosumab (PROLIA) 60 MG/ML SOSY injection Inject 60 mg into the skin every 6 (six) months.    [provider]  feeding supplement, ENSURE ENLIVE, (ENSURE ENLIVE) LIQD Take 237 mLs by mouth 3 (three) times daily between meals. 05/31/18   Radene Gunning, NP  fluticasone (FLOVENT HFA) 110 MCG/ACT inhaler USE 2 INHALATIONS TWICE A DAY 08/15/18   [name], MD  ipratropium (ATROVENT HFA) 17 MCG/ACT inhaler Inhale 2 puffs into the lungs every 6 (six) hours as needed for wheezing.  10/16/17   [name], MD  lidocaine (LIDODERM) 5 % Place 1 patch onto the skin daily. Remove & Discard patch within 12 hours or as directed by MD 06/01/18   Radene Gunning, NP  Multiple Vitamin (MULTIVITAMIN WITH MINERALS) TABS tablet Take 1 tablet by mouth daily. 06/01/18   Black, Lezlie Octave, NP  Polyethyl Glycol-Propyl Glycol (SYSTANE) 0.4-0.3 % SOLN Place 1 drop into both eyes 3 (three) times daily as needed (dry eyes).     [provider]  tamsulosin (FLOMAX) 0.4 MG CAPS capsule TAKE ONE CAPSULE BY MOUTH DAILY 08/01/18   [name], MD  traMADol (ULTRAM) 50 MG tablet Take 50 mg by mouth every 6 (six) hours as needed.    [provider]    Family History Family History  Problem Relation Age of Onset  . Colon cancer Brother   . Heart disease Brother   . Cancer Father   . Colon cancer Brother   . Aneurysm Sister     Social History Social History   Tobacco Use  . Smoking status: Former Smoker    Packs/day: 2.00    Years: 20.00    Pack years: 40.00    Types: Cigarettes    Quit date: 02/29/1988    Years since quitting: 30.5  . Smokeless tobacco: Never Used  Substance Use Topics  . Alcohol use: No    Alcohol/week: 0.0 standard drinks  . Drug use: No     Allergies   Patient has no known allergies.   Review of Systems Review of Systems  Constitutional: Positive for activity change, appetite change and fatigue. Negative for fever.  HENT: Negative for congestion and rhinorrhea.   Eyes: Negative for visual disturbance.  Respiratory: Positive for shortness of breath.   Cardiovascular: Negative for chest pain.  Gastrointestinal: Negative for abdominal pain, nausea and vomiting.  Genitourinary: Negative for dysuria and hematuria.  Musculoskeletal: Positive for arthralgias and myalgias.  Skin: Negative for rash.  Neurological: Positive for weakness. Negative for dizziness and numbness.    all other systems are negative except as noted in the HPI and  PMH.    Physical Exam Updated Vital Signs BP 97/67 (BP Location: Left Arm)   Pulse (!) 106   Temp (!) 101.1 F (38.4 C) (Rectal)   Resp (!) 27   SpO2 91%   Physical Exam Vitals signs and nursing note reviewed.  Constitutional:      General: He is not in acute distress.    Appearance: Normal appearance. He is well-developed and normal weight. He is ill-appearing.     Comments: Kyphosis present  HENT:     Head: Normocephalic and atraumatic.     Nose: Nose normal. No  rhinorrhea.     Mouth/Throat:     Mouth: Mucous membranes are dry.     Pharynx: No oropharyngeal exudate.  Eyes:     Conjunctiva/sclera: Conjunctivae normal.     Pupils: Pupils are equal, round, and reactive to light.  Neck:     Musculoskeletal: Normal range of motion and neck supple.     Comments: No meningismus. Cardiovascular:     Rate and Rhythm: Normal rate and regular rhythm.     Heart sounds: Normal heart sounds. No murmur.  Pulmonary:     Effort: Pulmonary effort is normal. No respiratory distress.     Breath sounds: Normal breath sounds.  Chest:     Chest wall: No tenderness.  Abdominal:     Palpations: Abdomen is soft.     Tenderness: There is no abdominal tenderness. There is no guarding or rebound.  Musculoskeletal: Normal range of motion.        General: No tenderness.  Skin:    General: Skin is warm.     Capillary Refill: Capillary refill takes less than 2 seconds.  Neurological:     General: No focal deficit present.     Mental Status: He is alert and oriented to person, place, and time. Mental status is at baseline.     Cranial Nerves: No cranial nerve deficit.     Motor: No abnormal muscle tone.     Coordination: Coordination normal.     Comments: No ataxia on finger to nose bilaterally. No pronator drift. 5/5 strength throughout. CN 2-12 intact.Equal grip strength. Sensation intact.   Psychiatric:        Behavior: Behavior normal.      ED Treatments / Results  Labs (all labs  ordered are listed, but only abnormal results are displayed) Labs Reviewed  LACTIC ACID, PLASMA - Abnormal; Notable for the following components:      Result Value   Lactic Acid, Venous 3.0 (*)    All other components within normal limits  COMPREHENSIVE METABOLIC PANEL - Abnormal; Notable for the following components:   Glucose, Bld 200 (*)    Calcium 8.7 (*)    Total Protein 5.7 (*)    Albumin 3.3 (*)    All other components within normal limits  CBC WITH DIFFERENTIAL/PLATELET - Abnormal; Notable for the following components:   WBC 20.3 (*)    Neutro Abs 18.3 (*)    Abs Immature Granulocytes 0.10 (*)    All other components within normal limits  BLOOD GAS, ARTERIAL - Abnormal; Notable for the following components:   pO2, Arterial 80.7 (*)    All other components within normal limits  CULTURE, BLOOD (ROUTINE X 2)  CULTURE, BLOOD (ROUTINE X 2)  SARS CORONAVIRUS 2 (HOSPITAL ORDER, [facility])  URINE CULTURE  LACTIC ACID, PLASMA  APTT  PROTIME-INR  URINALYSIS, ROUTINE W REFLEX MICROSCOPIC  BRAIN NATRIURETIC PEPTIDE    EKG EKG Interpretation  Date/Time:  Tuesday September 18 2018 23:57:33 EDT Ventricular Rate:  104 PR Interval:    QRS Duration: 87 QT Interval:  334 QTC Calculation: 440 R Axis:   -27 Text Interpretation:  Sinus tachycardia Borderline left axis deviation Probable anterior infarct, old Minimal ST depression, lateral leads Rate faster Confirmed by Ezequiel Essex [phone]) on 09/19/2018 6:43:40 AM   Radiology Ct Angio Chest Pe W And/or Wo Contrast  Result Date: 09/19/2018 CLINICAL DATA:  Fever and shortness of breath EXAM: CT ANGIOGRAPHY CHEST WITH CONTRAST TECHNIQUE: Multidetector CT imaging of  the chest was performed using the standard protocol during bolus administration of intravenous contrast. Multiplanar CT image reconstructions and MIPs were obtained to evaluate the vascular anatomy. CONTRAST:  150mL OMNIPAQUE IOHEXOL 350 MG/ML SOLN  COMPARISON:  05/26/2018 FINDINGS: Cardiovascular: Normal heart size. No pericardial effusion. Extensive aortic and coronary calcification. Stenosis of great vessel ostia with limited quantification due to pulmonary artery timing. No evidence of pulmonary embolism with reasonable visualization to the segmental level. Mediastinum/Nodes: Negative for adenopathy or mass. Lungs/Pleura: Low volume chest. Centrilobular emphysema. Reticulation and airspace density in the lower lungs that was also seen previously, although increased. Pulmonary microlithiasis versus previously aspirated material. Negative for effusion or air leak Upper Abdomen: Reported separately Musculoskeletal: Remote T3, T4, T5, T6, T8, T9, T10, T11, and T12 compression fractures. Remote sternal body fracture. Remote rib fractures. Exaggerated kyphosis. No acute fracture. Review of the MIP images confirms the above findings. IMPRESSION: 1. Negative for pulmonary embolism. 2. Chronic interstitial lung disease at the bases with apically emphysema and low volume lungs from kyphotic deformity. Cannot exclude superimposed pneumonia at the lung bases given density has increased from comparison. 3. There is microlithiasis versus aspirated material in the lower lobes, question chronic aspiration as cause of #2. Electronically Signed   By: Monte Fantasia M.D.   On: 09/19/2018 06:12   Ct Abdomen Pelvis W Contrast  Result Date: 09/19/2018 CLINICAL DATA:  Hypotension EXAM: CT ABDOMEN AND PELVIS WITH CONTRAST TECHNIQUE: Multidetector CT imaging of the abdomen and pelvis was performed using the standard protocol following bolus administration of intravenous contrast. CONTRAST:  170mL OMNIPAQUE IOHEXOL 350 MG/ML SOLN COMPARISON:  None. FINDINGS: Lower chest:  Reported on dedicated study Hepatobiliary: No focal liver abnormality.Cholelithiasis without evidence of cholecystitis. Pancreas: Unremarkable. Spleen: Unremarkable. Adrenals/Urinary Tract: Negative adrenals.  No hydronephrosis or ureteral stone. Limited detection of renal calculi given there is already excretion of contrast following chest CTA. Left renal cystic density that is small. Distorted bladder from enlarged prostate. Small layering calcifications in the dependent bladder. Stomach/Bowel: No obstruction. Appendectomy. Negative for bowel inflammation. Vascular/Lymphatic: Diffuse atherosclerotic calcification. Visceral stenoses there likely high-grade, but limited by technique. No mass or adenopathy. Reproductive:Symmetric enlargement of prostate gland. Other: No ascites or pneumoperitoneum. Musculoskeletal: Prominent osteopenia with endplate fractures throughout the lumbar and lower thoracic spine. Sacral plasty and multilevel vertebroplasty. No acute finding. IMPRESSION: 1. No acute finding. 2. Cholelithiasis. 3. Advanced atherosclerosis with visceral stenoses. Electronically Signed   By: Monte Fantasia M.D.   On: 09/19/2018 06:17   Dg Chest Port 1 View  Result Date: 09/19/2018 CLINICAL DATA:  85 year old male with weakness. EXAM: PORTABLE CHEST 1 VIEW COMPARISON:  Chest radiograph dated 07/05/2018 FINDINGS: Evaluation is limited due to positioning of the patient and superimposition of the mandible over the upper lung. Shallow inspiration with bibasilar atelectasis. Diffuse chronic interstitial coarsening. No new consolidative change. There is no pleural effusion or pneumothorax. Stable cardiac silhouette. No acute osseous pathology. Osteopenia with degenerative changes of the spine and vertebroplasty changes. IMPRESSION: No active disease. Electronically Signed   By: Anner Crete M.D.   On: 09/19/2018 00:41    Procedures Procedures (including critical care time)  Medications Ordered in ED Medications  ceFEPIme (MAXIPIME) 2 g in sodium chloride 0.9 % 100 mL IVPB (has no administration in time range)  metroNIDAZOLE (FLAGYL) IVPB 500 mg (has no administration in time range)  vancomycin (VANCOCIN)  IVPB 1000 mg/200 mL premix (has no administration in time range)  sodium chloride 0.9 % bolus 1,000 mL (has  no administration in time range)     Initial Impression / Assessment and Plan / ED Course  I have reviewed the triage vital signs and the nursing notes.  Pertinent labs & imaging results that were available during my care of the patient were reviewed by me and considered in my medical decision making (see chart for details).       Patient from home with generalized weakness and fever.  Patient hypotensive and hypoxic on arrival but mentating well.  Code sepsis was activated.  IVF and IV antibiotics given after cultures obtained. Lactate 3.  CXR without infiltrate. Leukotycosis noted. Covid negative.   Suspect occult pneumonia as source. Hypotension responding to fluids. BP 818H systolic, mentating well.  With persistent hypotension, CT imaging obtained. No PE. Possible aspiration seen on CT. Continue IVF and IV antibiotics.  BP improved to 100s. Lactate clearing.  Admission d.w Dr. Josephine Cables.  CRITICAL CARE Performed by: Ezequiel Essex Total critical care time: *45 minutes Critical care time was exclusive of separately billable procedures and treating other patients. Critical care was necessary to treat or prevent imminent or life-threatening deterioration. Critical care was time spent personally by me on the following activities: development of treatment plan with patient and/or surrogate as well as nursing, discussions with consultants, evaluation of patient's response to treatment, examination of patient, obtaining history from patient or surrogate, ordering and performing treatments and interventions, ordering and review of laboratory studies, ordering and review of radiographic studies, pulse oximetry and re-evaluation of patient's condition.  Alija Riano Bloch was evaluated in Emergency Department on 09/19/2018 for the symptoms described in the history of present illness. He  was evaluated in the context of the global COVID-19 pandemic, which necessitated consideration that the patient might be at risk for infection with the SARS-CoV-2 virus that causes COVID-19. Institutional protocols and algorithms that pertain to the evaluation of patients at risk for COVID-19 are in a state of rapid change based on information released by regulatory bodies including the CDC and federal and state organizations. These policies and algorithms were followed during the patient's care in the ED.  Final Clinical Impressions(s) / ED Diagnoses   Final diagnoses:  Sepsis with acute hypoxic respiratory failure without septic shock, due to unspecified organism [facility])  Community acquired pneumonia, unspecified laterality    ED Discharge Orders    None       Delonna Ney, Annie Main, MD 09/19/18 [id number]    Ezequiel Essex, MD 09/19/18 [phone]

## 2018-09-18 NOTE — ED Triage Notes (Signed)
Pt arrived from home stating around 8pm tonight he has been feeling weak and unable to walk at his baseline. Pts family reporting fever at home and given tylenol. Pt reports shortness of breath and chills. EMS reports orthostatic changes.

## 2018-09-19 ENCOUNTER — Inpatient Hospital Stay (HOSPITAL_COMMUNITY): Payer: Medicare Other

## 2018-09-19 ENCOUNTER — Encounter (HOSPITAL_COMMUNITY): Payer: Self-pay

## 2018-09-19 ENCOUNTER — Emergency Department (HOSPITAL_COMMUNITY): Payer: Medicare Other

## 2018-09-19 DIAGNOSIS — R531 Weakness: Secondary | ICD-10-CM | POA: Diagnosis not present

## 2018-09-19 DIAGNOSIS — Z87891 Personal history of nicotine dependence: Secondary | ICD-10-CM | POA: Diagnosis not present

## 2018-09-19 DIAGNOSIS — J9601 Acute respiratory failure with hypoxia: Secondary | ICD-10-CM | POA: Diagnosis present

## 2018-09-19 DIAGNOSIS — K8020 Calculus of gallbladder without cholecystitis without obstruction: Secondary | ICD-10-CM | POA: Diagnosis not present

## 2018-09-19 DIAGNOSIS — E785 Hyperlipidemia, unspecified: Secondary | ICD-10-CM | POA: Diagnosis not present

## 2018-09-19 DIAGNOSIS — I10 Essential (primary) hypertension: Secondary | ICD-10-CM | POA: Diagnosis not present

## 2018-09-19 DIAGNOSIS — Z79899 Other long term (current) drug therapy: Secondary | ICD-10-CM | POA: Diagnosis not present

## 2018-09-19 DIAGNOSIS — M19041 Primary osteoarthritis, right hand: Secondary | ICD-10-CM | POA: Diagnosis present

## 2018-09-19 DIAGNOSIS — M19042 Primary osteoarthritis, left hand: Secondary | ICD-10-CM | POA: Diagnosis present

## 2018-09-19 DIAGNOSIS — M810 Age-related osteoporosis without current pathological fracture: Secondary | ICD-10-CM | POA: Diagnosis present

## 2018-09-19 DIAGNOSIS — Z7951 Long term (current) use of inhaled steroids: Secondary | ICD-10-CM | POA: Diagnosis not present

## 2018-09-19 DIAGNOSIS — I159 Secondary hypertension, unspecified: Secondary | ICD-10-CM | POA: Diagnosis not present

## 2018-09-19 DIAGNOSIS — Z86008 Personal history of in-situ neoplasm of other site: Secondary | ICD-10-CM | POA: Diagnosis not present

## 2018-09-19 DIAGNOSIS — R1314 Dysphagia, pharyngoesophageal phase: Secondary | ICD-10-CM | POA: Diagnosis present

## 2018-09-19 DIAGNOSIS — Z8249 Family history of ischemic heart disease and other diseases of the circulatory system: Secondary | ICD-10-CM | POA: Diagnosis not present

## 2018-09-19 DIAGNOSIS — J432 Centrilobular emphysema: Secondary | ICD-10-CM | POA: Diagnosis present

## 2018-09-19 DIAGNOSIS — Z7982 Long term (current) use of aspirin: Secondary | ICD-10-CM | POA: Diagnosis not present

## 2018-09-19 DIAGNOSIS — I2510 Atherosclerotic heart disease of native coronary artery without angina pectoris: Secondary | ICD-10-CM | POA: Diagnosis present

## 2018-09-19 DIAGNOSIS — A419 Sepsis, unspecified organism: Secondary | ICD-10-CM | POA: Diagnosis not present

## 2018-09-19 DIAGNOSIS — Z8673 Personal history of transient ischemic attack (TIA), and cerebral infarction without residual deficits: Secondary | ICD-10-CM | POA: Diagnosis not present

## 2018-09-19 DIAGNOSIS — K000 Anodontia: Secondary | ICD-10-CM | POA: Diagnosis present

## 2018-09-19 DIAGNOSIS — R1313 Dysphagia, pharyngeal phase: Secondary | ICD-10-CM | POA: Diagnosis present

## 2018-09-19 DIAGNOSIS — K228 Other specified diseases of esophagus: Secondary | ICD-10-CM | POA: Diagnosis present

## 2018-09-19 DIAGNOSIS — Z8701 Personal history of pneumonia (recurrent): Secondary | ICD-10-CM | POA: Diagnosis not present

## 2018-09-19 DIAGNOSIS — J690 Pneumonitis due to inhalation of food and vomit: Secondary | ICD-10-CM | POA: Diagnosis present

## 2018-09-19 DIAGNOSIS — Z1159 Encounter for screening for other viral diseases: Secondary | ICD-10-CM | POA: Diagnosis not present

## 2018-09-19 DIAGNOSIS — R0602 Shortness of breath: Secondary | ICD-10-CM | POA: Diagnosis not present

## 2018-09-19 DIAGNOSIS — R1310 Dysphagia, unspecified: Secondary | ICD-10-CM | POA: Diagnosis not present

## 2018-09-19 LAB — BLOOD GAS, ARTERIAL
Acid-base deficit: 0.9 mmol/L (ref 0.0–2.0)
Bicarbonate: 22.6 mmol/L (ref 20.0–28.0)
Drawn by: 225631
O2 Content: 3 L/min
O2 Saturation: 94.5 %
Patient temperature: 101.1
pCO2 arterial: 38.1 mmHg (ref 32.0–48.0)
pH, Arterial: 7.398 (ref 7.350–7.450)
pO2, Arterial: 80.7 mmHg — ABNORMAL LOW (ref 83.0–108.0)

## 2018-09-19 LAB — LACTIC ACID, PLASMA
Lactic Acid, Venous: 0.8 mmol/L (ref 0.5–1.9)
Lactic Acid, Venous: 1.3 mmol/L (ref 0.5–1.9)
Lactic Acid, Venous: 3 mmol/L (ref 0.5–1.9)

## 2018-09-19 LAB — URINALYSIS, ROUTINE W REFLEX MICROSCOPIC
Bilirubin Urine: NEGATIVE
Glucose, UA: NEGATIVE mg/dL
Hgb urine dipstick: NEGATIVE
Ketones, ur: NEGATIVE mg/dL
Leukocytes,Ua: NEGATIVE
Nitrite: NEGATIVE
Protein, ur: NEGATIVE mg/dL
Specific Gravity, Urine: 1.016 (ref 1.005–1.030)
pH: 5 (ref 5.0–8.0)

## 2018-09-19 LAB — CBC WITH DIFFERENTIAL/PLATELET
Abs Immature Granulocytes: 0.1 10*3/uL — ABNORMAL HIGH (ref 0.00–0.07)
Basophils Absolute: 0.1 10*3/uL (ref 0.0–0.1)
Basophils Relative: 0 %
Eosinophils Absolute: 0.1 10*3/uL (ref 0.0–0.5)
Eosinophils Relative: 0 %
HCT: 47.7 % (ref 39.0–52.0)
Hemoglobin: 15 g/dL (ref 13.0–17.0)
Immature Granulocytes: 1 %
Lymphocytes Relative: 4 %
Lymphs Abs: 0.8 10*3/uL (ref 0.7–4.0)
MCH: 30.3 pg (ref 26.0–34.0)
MCHC: 31.4 g/dL (ref 30.0–36.0)
MCV: 96.4 fL (ref 80.0–100.0)
Monocytes Absolute: 1 10*3/uL (ref 0.1–1.0)
Monocytes Relative: 5 %
Neutro Abs: 18.3 10*3/uL — ABNORMAL HIGH (ref 1.7–7.7)
Neutrophils Relative %: 90 %
Platelets: 221 10*3/uL (ref 150–400)
RBC: 4.95 MIL/uL (ref 4.22–5.81)
RDW: 14.2 % (ref 11.5–15.5)
WBC: 20.3 10*3/uL — ABNORMAL HIGH (ref 4.0–10.5)
nRBC: 0 % (ref 0.0–0.2)

## 2018-09-19 LAB — COMPREHENSIVE METABOLIC PANEL
ALT: 24 U/L (ref 0–44)
AST: 23 U/L (ref 15–41)
Albumin: 3.3 g/dL — ABNORMAL LOW (ref 3.5–5.0)
Alkaline Phosphatase: 64 U/L (ref 38–126)
Anion gap: 10 (ref 5–15)
BUN: 16 mg/dL (ref 8–23)
CO2: 22 mmol/L (ref 22–32)
Calcium: 8.7 mg/dL — ABNORMAL LOW (ref 8.9–10.3)
Chloride: 106 mmol/L (ref 98–111)
Creatinine, Ser: 0.75 mg/dL (ref 0.61–1.24)
GFR calc Af Amer: >60 mL/min (ref >60)
GFR calc non Af Amer: >60 mL/min (ref >60)
Glucose, Bld: 200 mg/dL — ABNORMAL HIGH (ref 70–99)
Potassium: 3.9 mmol/L (ref 3.5–5.1)
Sodium: 138 mmol/L (ref 135–145)
Total Bilirubin: 0.8 mg/dL (ref 0.3–1.2)
Total Protein: 5.7 g/dL — ABNORMAL LOW (ref 6.5–8.1)

## 2018-09-19 LAB — STREP PNEUMONIAE URINARY ANTIGEN: Strep Pneumo Urinary Antigen: NEGATIVE

## 2018-09-19 LAB — SARS CORONAVIRUS 2 BY RT PCR (HOSPITAL ORDER, PERFORMED IN ~~LOC~~ HOSPITAL LAB): SARS Coronavirus 2: NEGATIVE

## 2018-09-19 LAB — BRAIN NATRIURETIC PEPTIDE: B Natriuretic Peptide: 75.5 pg/mL (ref 0.0–100.0)

## 2018-09-19 LAB — PROTIME-INR
INR: 1.2 (ref 0.8–1.2)
Prothrombin Time: 14.8 seconds (ref 11.4–15.2)

## 2018-09-19 LAB — APTT: aPTT: 26 seconds (ref 24–36)

## 2018-09-19 MED ORDER — TAMSULOSIN HCL 0.4 MG PO CAPS
0.4000 mg | ORAL_CAPSULE | Freq: Every day | ORAL | Status: DC
  Administered 2018-09-19 – 2018-09-21 (×3): 0.4 mg via ORAL
  Filled 2018-09-19 (×4): qty 1

## 2018-09-19 MED ORDER — CALCIUM CARBONATE-VITAMIN D 500-200 MG-UNIT PO TABS
1.0000 | ORAL_TABLET | Freq: Two times a day (BID) | ORAL | Status: DC
  Administered 2018-09-19 – 2018-09-21 (×5): 1 via ORAL
  Filled 2018-09-19 (×6): qty 1

## 2018-09-19 MED ORDER — ENOXAPARIN SODIUM 40 MG/0.4ML ~~LOC~~ SOLN
40.0000 mg | SUBCUTANEOUS | Status: DC
  Administered 2018-09-19 – 2018-09-20 (×2): 40 mg via SUBCUTANEOUS
  Filled 2018-09-19 (×3): qty 0.4

## 2018-09-19 MED ORDER — ACETAMINOPHEN 650 MG RE SUPP: 650.0000 mg | Freq: Four times a day (QID) | RECTAL | Status: DC | PRN

## 2018-09-19 MED ORDER — IOHEXOL 350 MG/ML SOLN
100.0000 mL | Freq: Once | INTRAVENOUS | Status: AC | PRN
  Administered 2018-09-19: 100 mL via INTRAVENOUS

## 2018-09-19 MED ORDER — SODIUM CHLORIDE 0.9 % IV SOLN
1.5000 g | Freq: Four times a day (QID) | INTRAVENOUS | Status: DC
  Administered 2018-09-19 – 2018-09-21 (×9): 1.5 g via INTRAVENOUS
  Filled 2018-09-19 (×2): qty 1.5
  Filled 2018-09-19 (×2): qty 4
  Filled 2018-09-19 (×4): qty 1.5
  Filled 2018-09-19: qty 4
  Filled 2018-09-19 (×2): qty 1.5

## 2018-09-19 MED ORDER — VITAMIN D3 25 MCG (1000 UNIT) PO TABS
1000.0000 [IU] | ORAL_TABLET | Freq: Two times a day (BID) | ORAL | Status: DC
  Administered 2018-09-19 – 2018-09-21 (×5): 1000 [IU] via ORAL
  Filled 2018-09-19 (×6): qty 1

## 2018-09-19 MED ORDER — ATORVASTATIN CALCIUM 20 MG PO TABS
20.0000 mg | ORAL_TABLET | Freq: Every day | ORAL | Status: DC
  Administered 2018-09-19 – 2018-09-21 (×3): 20 mg via ORAL
  Filled 2018-09-19 (×3): qty 1

## 2018-09-19 MED ORDER — ATENOLOL 25 MG PO TABS
25.0000 mg | ORAL_TABLET | Freq: Every day | ORAL | Status: DC
  Administered 2018-09-19 – 2018-09-21 (×3): 25 mg via ORAL
  Filled 2018-09-19 (×3): qty 1

## 2018-09-19 MED ORDER — CHLORHEXIDINE GLUCONATE 0.12 % MT SOLN
15.0000 mL | Freq: Two times a day (BID) | OROMUCOSAL | Status: DC
  Administered 2018-09-19 – 2018-09-21 (×4): 15 mL via OROMUCOSAL
  Filled 2018-09-19 (×4): qty 15

## 2018-09-19 MED ORDER — ORAL CARE MOUTH RINSE
15.0000 mL | Freq: Two times a day (BID) | OROMUCOSAL | Status: DC
  Administered 2018-09-20 – 2018-09-21 (×2): 15 mL via OROMUCOSAL

## 2018-09-19 MED ORDER — SODIUM CHLORIDE 0.9 % IV SOLN
INTRAVENOUS | Status: DC
  Administered 2018-09-19 – 2018-09-21 (×5): via INTRAVENOUS

## 2018-09-19 MED ORDER — POLYVINYL ALCOHOL 1.4 % OP SOLN: 1.0000 [drp] | Freq: Three times a day (TID) | OPHTHALMIC | Status: DC | PRN

## 2018-09-19 MED ORDER — ADULT MULTIVITAMIN W/MINERALS CH
1.0000 | ORAL_TABLET | Freq: Every day | ORAL | Status: DC
  Administered 2018-09-19 – 2018-09-21 (×3): 1 via ORAL
  Filled 2018-09-19 (×3): qty 1

## 2018-09-19 MED ORDER — SODIUM CHLORIDE (PF) 0.9 % IJ SOLN
INTRAMUSCULAR | Status: AC
  Administered 2018-09-19: 08:00:00
  Filled 2018-09-19: qty 50

## 2018-09-19 MED ORDER — BUDESONIDE 0.5 MG/2ML IN SUSP
1.0000 mg | Freq: Two times a day (BID) | RESPIRATORY_TRACT | Status: DC
  Administered 2018-09-19: 1 mg via RESPIRATORY_TRACT
  Administered 2018-09-20: 0.5 mg via RESPIRATORY_TRACT
  Administered 2018-09-20 – 2018-09-21 (×2): 1 mg via RESPIRATORY_TRACT
  Filled 2018-09-19 (×4): qty 4

## 2018-09-19 MED ORDER — ASPIRIN EC 81 MG PO TBEC
81.0000 mg | DELAYED_RELEASE_TABLET | Freq: Every day | ORAL | Status: DC
  Administered 2018-09-19 – 2018-09-21 (×3): 81 mg via ORAL
  Filled 2018-09-19 (×3): qty 1

## 2018-09-19 MED ORDER — ACETAMINOPHEN 325 MG PO TABS
650.0000 mg | ORAL_TABLET | Freq: Four times a day (QID) | ORAL | Status: DC | PRN
  Administered 2018-09-19: 650 mg via ORAL
  Filled 2018-09-19: qty 2

## 2018-09-19 MED ORDER — ONDANSETRON HCL 4 MG PO TABS: 4.0000 mg | ORAL_TABLET | Freq: Four times a day (QID) | ORAL | Status: DC | PRN

## 2018-09-19 MED ORDER — TRAMADOL HCL 50 MG PO TABS: 50.0000 mg | ORAL_TABLET | Freq: Four times a day (QID) | ORAL | Status: DC | PRN

## 2018-09-19 MED ORDER — ALENDRONATE SODIUM 70 MG PO TABS: 70.0000 mg | ORAL_TABLET | ORAL | Status: DC

## 2018-09-19 MED ORDER — ENSURE ENLIVE PO LIQD
237.0000 mL | Freq: Two times a day (BID) | ORAL | Status: DC
  Administered 2018-09-20 – 2018-09-21 (×3): 237 mL via ORAL
  Filled 2018-09-19: qty 237

## 2018-09-19 MED ORDER — SODIUM CHLORIDE 0.9 % IV BOLUS
1000.0000 mL | Freq: Once | INTRAVENOUS | Status: AC
  Administered 2018-09-19: 1000 mL via INTRAVENOUS

## 2018-09-19 MED ORDER — ONDANSETRON HCL 4 MG/2ML IJ SOLN: 4.0000 mg | Freq: Four times a day (QID) | INTRAMUSCULAR | Status: DC | PRN

## 2018-09-19 MED ORDER — POLYETHYLENE GLYCOL 3350 17 G PO PACK: 17.0000 g | PACK | Freq: Every day | ORAL | Status: DC | PRN

## 2018-09-19 MED ORDER — FLUTICASONE PROPIONATE HFA 110 MCG/ACT IN AERO: 2.0000 | INHALATION_SPRAY | Freq: Two times a day (BID) | RESPIRATORY_TRACT | Status: DC

## 2018-09-19 NOTE — ED Notes (Signed)
Pt provided with breakfast tray.

## 2018-09-19 NOTE — ED Notes (Signed)
ED TO INPATIENT HANDOFF REPORT  Name/Age/Gender [name] 85 y.o. male  Code Status    Code Status Orders  (From admission, onward)         Start     Ordered   09/19/18 1034  Full code  Continuous     09/19/18 1033        Code Status History    Date Active Date Inactive Code Status Order ID Comments User Context   05/26/2018 1817 05/31/2018 1742 Full Code [id number]  Merton Border, MD Inpatient   11/02/2013 0139 11/03/2013 1557 Full Code [id number]  Rise Patience, MD Inpatient   Advance Care Planning Activity      Home/SNF/Other Home  Chief Complaint Generalized Weakness  Level of Care/Admitting Diagnosis ED Disposition    ED Disposition Condition [facility]: [facility] [[id number]]  Level of Care: Telemetry [5]  Admit to tele based on following criteria: Other see comments  Comments: hypoxia  Covid Evaluation: Confirmed COVID Negative  Diagnosis: Sepsis [facility]) [[id number]]  Admitting Physician: Barb Merino [[id number]]  Attending Physician: Barb Merino [[id number]]  Estimated length of stay: past midnight tomorrow  Certification:: I certify this patient will need inpatient services for at least 2 midnights  PT Class (Do Not Modify): Inpatient [101]  PT Acc Code (Do Not Modify): Private [1]       Medical History Past Medical History:  Diagnosis Date  . Arthritis    hands   . BPH (benign prostatic hyperplasia)   . COPD (chronic obstructive pulmonary disease) ([place])   . Coronary artery disease   . CVA (cerebral infarction) 3 years ago   mild stroke  . HLD (hyperlipidemia)   . Hypertension   . Osteoporosis   . Pneumonia   . 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 SCC (squamous cell carcinoma) in situ 12/12/2017   left cheek inf.  . 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, left cheek    Allergies No Known Allergies  IV Location/Drains/Wounds Patient  Lines/Drains/Airways Status   Active Line/Drains/Airways    Name:   Placement date:   Placement time:   Site:   Days:   Peripheral IV 09/18/18 Anterior;Left Forearm   09/18/18    2326    Forearm   1   Peripheral IV 09/18/18 Right Antecubital   09/18/18    2359    Antecubital   1   External Urinary Catheter   05/31/18    0913    -   111   Incision - 3 Ports Abdomen 1: Umbilicus 2: Upper;Lateral;Right 3: Left;Lower;Lateral   11/02/13    0846     1782          Labs/Imaging Results for orders placed or performed during the hospital encounter of 09/18/18 (from the past 48 hour(s))  Lactic acid, plasma     Status: None   Collection Time: 09/18/18 11:55 PM  Result Value Ref Range   Lactic Acid, Venous 1.3 0.5 - 1.9 mmol/L    Comment: Performed at [facility], [place] [address], [place], [place] [address]  Lactic acid, plasma     Status: Abnormal   Collection Time: 09/18/18 11:55 PM  Result Value Ref Range   Lactic Acid, Venous 3.0 (HH) 0.5 - 1.9 mmol/L    Comment: CRITICAL RESULT CALLED TO, READ BACK BY AND VERIFIED WITH: Z TEETER RN  2229 09/19/18 A NAVARRO Performed at [facility], [place] [address] [place], [place] [address]   Comprehensive metabolic panel     Status: Abnormal   Collection Time: 09/18/18 11:55 PM  Result Value Ref Range   Sodium 138 135 - 145 mmol/L   Potassium 3.9 3.5 - 5.1 mmol/L   Chloride 106 98 - 111 mmol/L   CO2 22 22 - 32 mmol/L   Glucose, Bld 200 (H) 70 - 99 mg/dL   BUN 16 8 - 23 mg/dL   Creatinine, Ser 0.75 0.61 - 1.24 mg/dL   Calcium 8.7 (L) 8.9 - 10.3 mg/dL   Total Protein 5.7 (L) 6.5 - 8.1 g/dL   Albumin 3.3 (L) 3.5 - 5.0 g/dL   AST 23 15 - 41 U/L   ALT 24 0 - 44 U/L   Alkaline Phosphatase 64 38 - 126 U/L   Total Bilirubin 0.8 0.3 - 1.2 mg/dL   GFR calc non Af Amer >60 >60 mL/min   GFR calc Af Amer >60 >60 mL/min   Anion gap 10 5 - 15    Comment: Performed at [facility], [place] [address] [place], [place] [address]  CBC WITH DIFFERENTIAL     Status: Abnormal   Collection Time: 09/18/18 11:55 PM  Result Value Ref Range   WBC 20.3 (H) 4.0 - 10.5 K/uL   RBC 4.95 4.22 - 5.81 MIL/uL   Hemoglobin 15.0 13.0 - 17.0 g/dL   HCT 47.7 39.0 - 52.0 %   MCV 96.4 80.0 - 100.0 fL   MCH 30.3 26.0 - 34.0 pg   MCHC 31.4 30.0 - 36.0 g/dL   RDW 14.2 11.5 - 15.5 %   Platelets 221 150 - 400 K/uL   nRBC 0.0 0.0 - 0.2 %   Neutrophils Relative % 90 %   Neutro Abs 18.3 (H) 1.7 - 7.7 K/uL   Lymphocytes Relative 4 %   Lymphs Abs 0.8 0.7 - 4.0 K/uL   Monocytes Relative 5 %   Monocytes Absolute 1.0 0.1 - 1.0 K/uL   Eosinophils Relative 0 %   Eosinophils Absolute 0.1 0.0 - 0.5 K/uL   Basophils Relative 0 %   Basophils Absolute 0.1 0.0 - 0.1 K/uL   Immature Granulocytes 1 %   Abs Immature Granulocytes 0.10 (H) 0.00 - 0.07 K/uL    Comment: Performed at [facility], [place] [address], [place], [place] [address]  APTT     Status: None   Collection Time: 09/18/18 11:55 PM  Result Value Ref Range   aPTT 26 24 - 36 seconds    Comment: Performed at [facility], [place] [address], [place], [place] [address]  Protime-INR     Status: None   Collection Time: 09/18/18 11:55 PM  Result Value Ref Range   Prothrombin Time 14.8 11.4 - 15.2 seconds   INR 1.2 0.8 - 1.2    Comment: (NOTE) INR goal varies based on device and disease states. Performed at [facility], [place] [address] [place], [place] [address]   Blood Culture (routine x 2)     Status: None (Preliminary result)   Collection Time: 09/18/18 11:55 PM   Specimen: BLOOD LEFT ARM  Result Value Ref Range   Specimen Description      BLOOD LEFT ARM Performed at [place] [address], [place], [place] [address]    Special Requests      BOTTLES DRAWN AEROBIC AND  ANAEROBIC Blood Culture adequate volume Performed at [place] [address], [place], [place]  [address]    Culture PENDING    Report Status PENDING   Blood Culture (routine x 2)     Status: None (Preliminary result)   Collection Time: 09/18/18 11:55 PM   Specimen: BLOOD RIGHT ARM  Result Value Ref Range   Specimen Description      BLOOD RIGHT ARM Performed at [facility], [place] [address] [place], [place] [address]    Special Requests      BOTTLES DRAWN AEROBIC AND ANAEROBIC Blood Culture adequate volume Performed at [place] [address], [place], [place] [address]    Culture PENDING    Report Status PENDING   Urinalysis, Routine w reflex microscopic     Status: None   Collection Time: 09/18/18 11:55 PM  Result Value Ref Range   Color, Urine YELLOW YELLOW   APPearance CLEAR CLEAR   Specific Gravity, Urine 1.016 1.005 - 1.030   pH 5.0 5.0 - 8.0   Glucose, UA NEGATIVE NEGATIVE mg/dL   Hgb urine dipstick NEGATIVE NEGATIVE   Bilirubin Urine NEGATIVE NEGATIVE   Ketones, ur NEGATIVE NEGATIVE mg/dL   Protein, ur NEGATIVE NEGATIVE mg/dL   Nitrite NEGATIVE NEGATIVE   Leukocytes,Ua NEGATIVE NEGATIVE    Comment: Performed at [facility], [place] [address], [place], [place] [address]  Brain natriuretic peptide     Status: None   Collection Time: 09/18/18 11:55 PM  Result Value Ref Range   B Natriuretic Peptide 75.5 0.0 - 100.0 pg/mL    Comment: Performed at [facility], [place] [address], [place], [place] [address]  SARS Coronavirus 2 (CEPHEID- Performed in [place] hospital lab), Hosp Order     Status: None   Collection Time: 09/18/18 11:55 PM   Specimen: Nasopharyngeal Swab  Result Value Ref Range   SARS Coronavirus 2 NEGATIVE NEGATIVE    Comment: (NOTE) If result is NEGATIVE SARS-CoV-2 target nucleic acids are NOT DETECTED. The SARS-CoV-2 RNA is generally detectable in upper and lower  respiratory specimens during the acute phase of infection. The lowest  concentration of SARS-CoV-2 viral copies this  assay can detect is 250  copies / mL. A negative result does not preclude SARS-CoV-2 infection  and should not be used as the sole basis for treatment or other  patient management decisions.  A negative result may occur with  improper specimen collection / handling, submission of specimen other  than nasopharyngeal swab, presence of viral mutation(s) within the  areas targeted by this assay, and inadequate number of viral copies  (<250 copies / mL). A negative result must be combined with clinical  observations, patient history, and epidemiological information. If result is POSITIVE SARS-CoV-2 target nucleic acids are DETECTED. The SARS-CoV-2 RNA is generally detectable in upper and lower  respiratory specimens dur ing the acute phase of infection.  Positive  results are indicative of active infection with SARS-CoV-2.  Clinical  correlation with patient history and other diagnostic information is  necessary to determine patient infection status.  Positive results do  not rule out bacterial infection or co-infection with other viruses. If result is PRESUMPTIVE POSTIVE SARS-CoV-2 nucleic acids MAY BE PRESENT.   A presumptive positive result was obtained on the submitted specimen  and confirmed on repeat testing.  While 2019 novel coronavirus  (SARS-CoV-2) nucleic acids may be present in the submitted sample  additional confirmatory testing may  be necessary for epidemiological  and / or clinical management purposes  to differentiate between  SARS-CoV-2 and other Sarbecovirus currently known to infect humans.  If clinically indicated additional testing with an alternate test  methodology [phone]) is advised. The SARS-CoV-2 RNA is generally  detectable in upper and lower respiratory sp ecimens during the acute  phase of infection. The expected result is Negative. Fact Sheet for Patients:  StrictlyIdeas.no Fact Sheet for Healthcare  Providers: BankingDealers.co.za This test is not yet approved or cleared by the [place] FDA and has been authorized for detection and/or diagnosis of SARS-CoV-2 by FDA under an Emergency Use Authorization (EUA).  This EUA will remain in effect (meaning this test can be used) for the duration of the COVID-19 declaration under Section 564(b)(1) of the Act, 21 U.S.C. section 360bbb-3(b)(1), unless the authorization is terminated or revoked sooner. Performed at [facility], [place] [address], [place], [place] [address]   Blood gas, arterial (at [facility])     Status: Abnormal   Collection Time: 09/19/18  1:40 AM  Result Value Ref Range   O2 Content 3.0 L/min   Delivery systems NASAL CANNULA    pH, Arterial 7.398 7.350 - 7.450   pCO2 arterial 38.1 32.0 - 48.0 mmHg   pO2, Arterial 80.7 (L) 83.0 - 108.0 mmHg   Bicarbonate 22.6 20.0 - 28.0 mmol/L   Acid-base deficit 0.9 0.0 - 2.0 mmol/L   O2 Saturation 94.5 %   Patient temperature 101.1    Collection site LEFT RADIAL    Drawn by [id number]    Sample type ARTERIAL DRAW    Allens test (pass/fail) PASS PASS    Comment: Performed at [facility], [place] [address] [place], [place] [address]   Ct Angio Chest Pe W And/or Wo Contrast  Result Date: 09/19/2018 CLINICAL DATA:  Fever and shortness of breath EXAM: CT ANGIOGRAPHY CHEST WITH CONTRAST TECHNIQUE: Multidetector CT imaging of the chest was performed using the standard protocol during bolus administration of intravenous contrast. Multiplanar CT image reconstructions and MIPs were obtained to evaluate the vascular anatomy. CONTRAST:  143mL OMNIPAQUE IOHEXOL 350 MG/ML SOLN COMPARISON:  05/26/2018 FINDINGS: Cardiovascular: Normal heart size. No pericardial effusion. Extensive aortic and coronary calcification. Stenosis of great vessel ostia with limited quantification due to pulmonary artery timing. No evidence of pulmonary embolism with  reasonable visualization to the segmental level. Mediastinum/Nodes: Negative for adenopathy or mass. Lungs/Pleura: Low volume chest. Centrilobular emphysema. Reticulation and airspace density in the lower lungs that was also seen previously, although increased. Pulmonary microlithiasis versus previously aspirated material. Negative for effusion or air leak Upper Abdomen: Reported separately Musculoskeletal: Remote T3, T4, T5, T6, T8, T9, T10, T11, and T12 compression fractures. Remote sternal body fracture. Remote rib fractures. Exaggerated kyphosis. No acute fracture. Review of the MIP images confirms the above findings. IMPRESSION: 1. Negative for pulmonary embolism. 2. Chronic interstitial lung disease at the bases with apically emphysema and low volume lungs from kyphotic deformity. Cannot exclude superimposed pneumonia at the lung bases given density has increased from comparison. 3. There is microlithiasis versus aspirated material in the lower lobes, question chronic aspiration as cause of #2. Electronically Signed   By: Monte Fantasia M.D.   On: 09/19/2018 06:12   Ct Abdomen Pelvis W Contrast  Result Date: 09/19/2018 CLINICAL DATA:  Hypotension EXAM: CT ABDOMEN AND PELVIS WITH CONTRAST TECHNIQUE: Multidetector CT imaging of the abdomen and pelvis was performed using the standard protocol following bolus administration  of intravenous contrast. CONTRAST:  159mL OMNIPAQUE IOHEXOL 350 MG/ML SOLN COMPARISON:  None. FINDINGS: Lower chest:  Reported on dedicated study Hepatobiliary: No focal liver abnormality.Cholelithiasis without evidence of cholecystitis. Pancreas: Unremarkable. Spleen: Unremarkable. Adrenals/Urinary Tract: Negative adrenals. No hydronephrosis or ureteral stone. Limited detection of renal calculi given there is already excretion of contrast following chest CTA. Left renal cystic density that is small. Distorted bladder from enlarged prostate. Small layering calcifications in the dependent  bladder. Stomach/Bowel: No obstruction. Appendectomy. Negative for bowel inflammation. Vascular/Lymphatic: Diffuse atherosclerotic calcification. Visceral stenoses there likely high-grade, but limited by technique. No mass or adenopathy. Reproductive:Symmetric enlargement of prostate gland. Other: No ascites or pneumoperitoneum. Musculoskeletal: Prominent osteopenia with endplate fractures throughout the lumbar and lower thoracic spine. Sacral plasty and multilevel vertebroplasty. No acute finding. IMPRESSION: 1. No acute finding. 2. Cholelithiasis. 3. Advanced atherosclerosis with visceral stenoses. Electronically Signed   By: Monte Fantasia M.D.   On: 09/19/2018 06:17   Dg Chest Port 1 View  Result Date: 09/19/2018 CLINICAL DATA:  85 year old male with weakness. EXAM: PORTABLE CHEST 1 VIEW COMPARISON:  Chest radiograph dated 07/05/2018 FINDINGS: Evaluation is limited due to positioning of the patient and superimposition of the mandible over the upper lung. Shallow inspiration with bibasilar atelectasis. Diffuse chronic interstitial coarsening. No new consolidative change. There is no pleural effusion or pneumothorax. Stable cardiac silhouette. No acute osseous pathology. Osteopenia with degenerative changes of the spine and vertebroplasty changes. IMPRESSION: No active disease. Electronically Signed   By: Anner Crete M.D.   On: 09/19/2018 00:41    Pending Labs Unresulted Labs (From admission, onward)    Start     Ordered   09/20/18 3267  Basic metabolic panel  Tomorrow morning,   R     09/19/18 1033   09/20/18 0500  CBC  Tomorrow morning,   R     09/19/18 1033   09/19/18 1555  Lactic acid, plasma  ONCE - STAT,   STAT     09/19/18 1554   09/19/18 1034  HIV antibody (Routine Screening)  Once,   STAT     09/19/18 1033   09/19/18 1034  Culture, sputum-assessment  Once,   R     09/19/18 1033   09/19/18 1034  Legionella Pneumophila Serogp 1 Ur Ag  Once,   STAT     09/19/18 1033   09/19/18  1034  Strep pneumoniae urinary antigen  Once,   STAT     09/19/18 1033   09/18/18 2343  Urine culture  ONCE - STAT,   STAT     09/18/18 2343          Vitals/Pain Today's Vitals   09/19/18 1656 09/19/18 1657 09/19/18 1658 09/19/18 1700  BP:    (!) 114/59  Pulse: 65 67 66 63  Resp: 19 17 17 17   Temp:      TempSrc:      SpO2: 100% 100% 100% 100%  Weight:      Height:      PainSc:        Isolation Precautions No active isolations  Medications Medications  aspirin EC tablet 81 mg (81 mg Oral Given 09/19/18 1115)  traMADol (ULTRAM) tablet 50 mg (has no administration in time range)  atenolol (TENORMIN) tablet 25 mg (25 mg Oral Given 09/19/18 1109)  atorvastatin (LIPITOR) tablet 20 mg (20 mg Oral Given 09/19/18 1110)  tamsulosin (FLOMAX) capsule 0.4 mg (0.4 mg Oral Given 09/19/18 1110)  calcium-vitamin D (OSCAL WITH D) 500-200 MG-UNIT  per tablet 1 tablet (1 tablet Oral Given 09/19/18 1110)  cholecalciferol (VITAMIN D) tablet 1,000 Units (1,000 Units Oral Given 09/19/18 1111)  feeding supplement (ENSURE ENLIVE) (ENSURE ENLIVE) liquid 237 mL (0 mLs Oral Hold 09/19/18 1403)  multivitamin with minerals tablet 1 tablet (1 tablet Oral Given 09/19/18 1109)  fluticasone (FLOVENT HFA) 110 MCG/ACT inhaler 2 puff (2 puffs Inhalation Not Given 09/19/18 1238)  polyvinyl alcohol (LIQUIFILM TEARS) 1.4 % ophthalmic solution 1 drop (has no administration in time range)  enoxaparin (LOVENOX) injection 40 mg (has no administration in time range)  0.9 %  sodium chloride infusion ( Intravenous New Bag/Given 09/19/18 1119)  acetaminophen (TYLENOL) tablet 650 mg (has no administration in time range)    Or  acetaminophen (TYLENOL) suppository 650 mg (has no administration in time range)  polyethylene glycol (MIRALAX / GLYCOLAX) packet 17 g (has no administration in time range)  ondansetron (ZOFRAN) tablet 4 mg (has no administration in time range)    Or  ondansetron (ZOFRAN) injection 4 mg (has no  administration in time range)  ampicillin-sulbactam (UNASYN) 1.5 g in sodium chloride 0.9 % 100 mL IVPB (0 g Intravenous Stopped 09/19/18 1315)  ceFEPIme (MAXIPIME) 2 g in sodium chloride 0.9 % 100 mL IVPB (0 g Intravenous Stopped 09/19/18 0203)  metroNIDAZOLE (FLAGYL) IVPB 500 mg (0 mg Intravenous Stopped 09/19/18 0203)  sodium chloride 0.9 % bolus 1,000 mL (0 mLs Intravenous Stopped 09/19/18 0215)  vancomycin (VANCOCIN) 1,250 mg in sodium chloride 0.9 % 250 mL IVPB (0 mg Intravenous Stopped 09/19/18 0340)  sodium chloride 0.9 % bolus 1,000 mL (0 mLs Intravenous Stopped 09/19/18 0414)  sodium chloride (PF) 0.9 % injection (  Given by Other 09/19/18 0732)  iohexol (OMNIPAQUE) 350 MG/ML injection 100 mL (100 mLs Intravenous Contrast Given 09/19/18 0537)    Mobility walks with device

## 2018-09-19 NOTE — Progress Notes (Addendum)
Order for swallow evaluation received, pt was scheduled for OP MBS in June 2020 - therefore recommend proceed with MBS in lieu of clinical evaluation.  Secure chatted MD with request for MBS.  With further investigation, MBS was conducted that showed significant obstructive pharyngeal dysphagia with aspiration.  Given his deconditioning and concern for chronic aspiration, recommend to repeat MBS to allow view of pharyngeal muscular and structure.  Secure chatted MD at approximately 1215 and tried to call ED at [phone] to relay information.  Call went to automatic recording. Will continue with plan for MBS at 1400 and highly advise palliative consult to establish goals of care given pt's likely chronic aspiration that he is no longer tolerating with deconditioning.  Thanks.  [name], [facility] SLP Acute Rehab Services Pager [phone] Office [phone]

## 2018-09-19 NOTE — H&P (Signed)
History and Physical    [name] MHW:[id number] DOB: 1931-09-18 DOA: 09/18/2018  PCP: [name], MD  Patient coming from: Home.  I have personally briefly reviewed patient's old medical records available.   Chief Complaint: Shortness of breath.  HPI: [name] is a 85 y.o. male with medical history significant of ambulatory difficulties, previous history of stroke, COPD, suspected aspiration, hypertension and coronary artery disease presenting to the emergency room with sudden onset of difficulty walking and temperature of 101 at home.  Patient is poor historian.  According to him he does walk around the house with a cane.  He had no issues until yesterday.  He said he had swallowing test done 2 weeks ago and he is eating regular diet.  Patient tells me that last night about 8 PM he just felt very weak and was unable to walk.  They took temperature at home and it was 101.  Patient does have chronic cough.  Denies any sputum production.  Denies any headache, nausea, vomiting.  Denies any abdominal pain.  Denies any chest pain.  Denies any change in bowel or bladder habits.  Lives at home with son, no other family members with COVID-19 infection at home.  Does not use oxygen at baseline. ED Course: Noted hypotensive on arrival with blood pressure of 75/54, EMS also reported orthostatic.  Temperature was 101.1.  WBC 20.3.  Lactic acid was 3. CT scan of the chest abdomen pelvis were done, negative for acute PE.  Advance emphysematous changes, basal scarring and probable infiltrate.  Treated with IV fluid boluses with sepsis protocol, cultures were sent before restarting antibiotics, patient was given vancomycin, cefepime and Flagyl in the ER.  Patient also noted to be hypoxic with 82% on room air improved to 100% with 2 L.  Review of Systems: all systems are reviewed and pertinent positive as per HPI otherwise rest are negative.    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, left cheek    Past Surgical History:  Procedure Laterality Date   BACK SURGERY     LAPAROSCOPIC APPENDECTOMY N/A 11/02/2013   Procedure: APPENDECTOMY LAPAROSCOPIC;  Surgeon: Donnie Mesa, MD;  Location: [place];  Service: General;  Laterality: N/A;     reports that he quit smoking about 30 years ago. His smoking use included cigarettes. He has a 40.00 pack-year smoking history. He has never used smokeless tobacco. He reports that he does not drink alcohol or use drugs.  No Known Allergies  Family History  Problem Relation Age of Onset   Colon cancer Brother    Heart disease Brother    Cancer Father    Colon cancer Brother    Aneurysm Sister      Prior to Admission medications   Medication Sig Start Date End Date Taking? Authorizing Provider  acetaminophen (TYLENOL) 500 MG tablet Take 500-1,000 mg by mouth every 8 (  eight) hours as needed for mild pain or headache.    Yes [provider]  alendronate (FOSAMAX) 70 MG tablet Take 1 tablet (70 mg total) by mouth every Friday. take with a full glass of water on an empty stomach 08/17/18  Yes Renato Shin, MD  aspirin EC 81 MG tablet Take 81 mg by mouth daily.   Yes [provider]  atenolol (TENORMIN) 25 MG tablet Take 1 tablet (25 mg total) by mouth daily. 07/04/18  Yes [name], MD  atorvastatin (LIPITOR) 20 MG tablet TAKE ONE TABLET BY MOUTH DAILY Patient taking differently: Take 20 mg by mouth daily.  09/03/18  Yes [name], MD  calcium-vitamin D (OSCAL WITH D) 500-200 MG-UNIT tablet Take 1 tablet by mouth 2 (two) times daily.   Yes [provider]  Cholecalciferol (VITAMIN D-3) 1000 units CAPS Take 1,000 Units by mouth 2 (two) times a day.    Yes [provider]  denosumab (PROLIA) 60 MG/ML SOSY injection Inject 60 mg into the skin every 6 (six) months.   Yes [provider]  feeding supplement, ENSURE ENLIVE, (ENSURE ENLIVE) LIQD Take 237 mLs by mouth 3 (three) times daily between meals. Patient taking differently: Take 237 mLs by mouth 2 (two) times daily between meals.  05/31/18  Yes Black, Lezlie Octave, NP  fluticasone (FLOVENT HFA) 110 MCG/ACT inhaler USE 2 INHALATIONS TWICE A DAY Patient taking differently: Inhale 2 puffs into the lungs 2 (two) times a day.  08/15/18  Yes [name], MD  ipratropium (ATROVENT HFA) 17 MCG/ACT inhaler Inhale 2 puffs into the lungs every 6 (six) hours as needed for wheezing. 10/16/17  Yes [name], MD  lidocaine (LIDODERM) 5 % Place 1 patch onto the skin daily. Remove & Discard patch within 12 hours or as directed by MD 06/01/18  Yes Black, Lezlie Octave, NP  Multiple Vitamin (MULTIVITAMIN WITH MINERALS) TABS tablet Take 1 tablet by mouth daily. 06/01/18  Yes Black, Lezlie Octave, NP  Polyethyl Glycol-Propyl Glycol (SYSTANE) 0.4-0.3 % SOLN Place 1 drop into both eyes 3 (three) times daily as needed (dry eyes).    Yes [provider]  tamsulosin (FLOMAX) 0.4 MG CAPS capsule TAKE ONE CAPSULE BY MOUTH DAILY Patient taking differently: Take 0.4 mg by mouth daily.  08/01/18  Yes [name], MD  traMADol (ULTRAM) 50 MG tablet Take 50 mg by mouth every 6 (six) hours as needed for moderate pain.    Yes [provider]    Physical Exam: Vitals:   09/19/18 0500 09/19/18 0602 09/19/18 0700 09/19/18 0800  BP: 111/65 124/83 (!) 114/59   Pulse: 83 79 61   Resp: (!) 24 (!) 21 15   Temp:    99.4 F (37.4 C)  TempSrc:    Rectal    SpO2: 98% 100% 98%   Weight:      Height:    6\' 1"  (1.854 m)    Constitutional: NAD, calm, comfortable Vitals:   09/19/18 0500 09/19/18 0602 09/19/18 0700 09/19/18 0800  BP: 111/65 124/83 (!) 114/59   Pulse: 83 79 61   Resp: (!) 24 (!) 21 15   Temp:    99.4 F (37.4 C)  TempSrc:    Rectal  SpO2: 98% 100% 98%   Weight:      Height:    6\' 1"  (1.854 m)   Eyes: PERRL, lids and conjunctivae normal ENMT: Mucous membranes are dry. Posterior pharynx clear of any  exudate or lesions.Normal dentition.  Neck: normal, supple, no masses, no thyromegaly Respiratory: Mostly conducted airway sounds, no wheezing, no crackles. Normal respiratory effort. No accessory muscle use.  Cardiovascular: Regular rate and rhythm, no murmurs / rubs / gallops. No extremity edema. 2+ pedal pulses. No carotid bruits.  Abdomen: no tenderness, no masses palpated. No hepatosplenomegaly. Bowel sounds positive.  Musculoskeletal: no clubbing / cyanosis. No joint deformity upper and lower extremities. Good ROM, no contractures. Normal muscle tone.  Skin: no rashes, lesions, ulcers. No induration Neurologic: CN 2-12 grossly intact. Sensation intact, DTR normal. Strength 5/5 in all 4.  Psychiatric: Normal judgment and insight. Alert and oriented x 3. Normal mood.     Labs on Admission: I have personally reviewed following labs and imaging studies  CBC: Recent Labs  Lab 09/18/18 2355  WBC 20.3*  NEUTROABS 18.3*  HGB 15.0  HCT 47.7  MCV 96.4  PLT 220   Basic Metabolic Panel: Recent Labs  Lab 09/18/18 2355  NA 138  K 3.9  CL 106  CO2 22  GLUCOSE 200*  BUN 16  CREATININE 0.75  CALCIUM 8.7*   GFR: Estimated Creatinine Clearance: 59.5 mL/min (by C-G formula based on SCr of 0.75 mg/dL). Liver Function Tests: Recent Labs  Lab 09/18/18 2355  AST 23  ALT 24  ALKPHOS 64  BILITOT 0.8  PROT 5.7*  ALBUMIN 3.3*   No results for input(s): LIPASE, AMYLASE in the last 168 hours. No results for input(s):  AMMONIA in the last 168 hours. Coagulation Profile: Recent Labs  Lab 09/18/18 2355  INR 1.2   Cardiac Enzymes: No results for input(s): CKTOTAL, CKMB, CKMBINDEX, TROPONINI in the last 168 hours. BNP (last 3 results) No results for input(s): PROBNP in the last 8760 hours. HbA1C: No results for input(s): HGBA1C in the last 72 hours. CBG: No results for input(s): GLUCAP in the last 168 hours. Lipid Profile: No results for input(s): CHOL, HDL, LDLCALC, TRIG, CHOLHDL, LDLDIRECT in the last 72 hours. Thyroid Function Tests: No results for input(s): TSH, T4TOTAL, FREET4, T3FREE, THYROIDAB in the last 72 hours. Anemia Panel: No results for input(s): VITAMINB12, FOLATE, FERRITIN, TIBC, IRON, RETICCTPCT in the last 72 hours. Urine analysis:    Component Value Date/Time   COLORURINE YELLOW 09/18/2018 2355   APPEARANCEUR CLEAR 09/18/2018 2355   LABSPEC 1.016 09/18/2018 2355   PHURINE 5.0 09/18/2018 2355   GLUCOSEU NEGATIVE 09/18/2018 2355   HGBUR NEGATIVE 09/18/2018 2355   BILIRUBINUR NEGATIVE 09/18/2018 2355   BILIRUBINUR negative 11/22/2013 0945   KETONESUR NEGATIVE 09/18/2018 2355   PROTEINUR NEGATIVE 09/18/2018 2355   UROBILINOGEN 0.2 11/22/2013 0945   UROBILINOGEN 1.0 11/01/2013 1954   NITRITE NEGATIVE 09/18/2018 2355   LEUKOCYTESUR NEGATIVE 09/18/2018 2355    Radiological Exams on Admission: Ct Angio Chest Pe W And/or Wo Contrast  Result Date: 09/19/2018 CLINICAL DATA:  Fever and shortness of breath EXAM: CT ANGIOGRAPHY CHEST WITH CONTRAST TECHNIQUE: Multidetector CT imaging of the chest was performed using the standard protocol during bolus administration of intravenous contrast. Multiplanar CT image reconstructions and MIPs were obtained to evaluate the vascular anatomy. CONTRAST:  115mL OMNIPAQUE IOHEXOL 350 MG/ML SOLN COMPARISON:  05/26/2018 FINDINGS: Cardiovascular: Normal heart size. No pericardial effusion. Extensive aortic and coronary calcification. Stenosis of great  vessel ostia with limited quantification due to pulmonary artery timing. No evidence of pulmonary embolism with reasonable visualization to the segmental level. Mediastinum/Nodes: Negative for adenopathy or mass. Lungs/Pleura: Low volume chest. Centrilobular emphysema. Reticulation and airspace density in  the lower lungs that was also seen previously, although increased. Pulmonary microlithiasis versus previously aspirated material. Negative for effusion or air leak Upper Abdomen: Reported separately Musculoskeletal: Remote T3, T4, T5, T6, T8, T9, T10, T11, and T12 compression fractures. Remote sternal body fracture. Remote rib fractures. Exaggerated kyphosis. No acute fracture. Review of the MIP images confirms the above findings. IMPRESSION: 1. Negative for pulmonary embolism. 2. Chronic interstitial lung disease at the bases with apically emphysema and low volume lungs from kyphotic deformity. Cannot exclude superimposed pneumonia at the lung bases given density has increased from comparison. 3. There is microlithiasis versus aspirated material in the lower lobes, question chronic aspiration as cause of #2. Electronically Signed   By: Monte Fantasia M.D.   On: 09/19/2018 06:12   Ct Abdomen Pelvis W Contrast  Result Date: 09/19/2018 CLINICAL DATA:  Hypotension EXAM: CT ABDOMEN AND PELVIS WITH CONTRAST TECHNIQUE: Multidetector CT imaging of the abdomen and pelvis was performed using the standard protocol following bolus administration of intravenous contrast. CONTRAST:  116mL OMNIPAQUE IOHEXOL 350 MG/ML SOLN COMPARISON:  None. FINDINGS: Lower chest:  Reported on dedicated study Hepatobiliary: No focal liver abnormality.Cholelithiasis without evidence of cholecystitis. Pancreas: Unremarkable. Spleen: Unremarkable. Adrenals/Urinary Tract: Negative adrenals. No hydronephrosis or ureteral stone. Limited detection of renal calculi given there is already excretion of contrast following chest CTA. Left renal cystic  density that is small. Distorted bladder from enlarged prostate. Small layering calcifications in the dependent bladder. Stomach/Bowel: No obstruction. Appendectomy. Negative for bowel inflammation. Vascular/Lymphatic: Diffuse atherosclerotic calcification. Visceral stenoses there likely high-grade, but limited by technique. No mass or adenopathy. Reproductive:Symmetric enlargement of prostate gland. Other: No ascites or pneumoperitoneum. Musculoskeletal: Prominent osteopenia with endplate fractures throughout the lumbar and lower thoracic spine. Sacral plasty and multilevel vertebroplasty. No acute finding. IMPRESSION: 1. No acute finding. 2. Cholelithiasis. 3. Advanced atherosclerosis with visceral stenoses. Electronically Signed   By: Monte Fantasia M.D.   On: 09/19/2018 06:17   Dg Chest Port 1 View  Result Date: 09/19/2018 CLINICAL DATA:  85 year old male with weakness. EXAM: PORTABLE CHEST 1 VIEW COMPARISON:  Chest radiograph dated 07/05/2018 FINDINGS: Evaluation is limited due to positioning of the patient and superimposition of the mandible over the upper lung. Shallow inspiration with bibasilar atelectasis. Diffuse chronic interstitial coarsening. No new consolidative change. There is no pleural effusion or pneumothorax. Stable cardiac silhouette. No acute osseous pathology. Osteopenia with degenerative changes of the spine and vertebroplasty changes. IMPRESSION: No active disease. Electronically Signed   By: Anner Crete M.D.   On: 09/19/2018 00:41    EKG: Independently reviewed.  Normal sinus rhythm.  No acute ST-T wave changes.  QTC is 440.  Comparable to previous EKGs.  Assessment/Plan Principal Problem:   Sepsis ([place]) Active Problems:   HTN (hypertension)   History of CVA (cerebrovascular accident)   Hyperlipidemia   Aspiration pneumonia ([place])     1.  Sepsis: Suspect due to pneumonia, most likely aspiration pneumonia: COVID-19 negative. Agree with admission given severity of  symptoms.  Patient has received broad-spectrum antibiotics, likely source is aspiration.  Will treat with IV Unasyn. Chest physiotherapy, incentive spirometry, deep breathing exercises, sputum induction, mucolytic's and bronchodilators.Sputum cultures, blood cultures, Legionella and streptococcal antigen. Supplemental oxygen to keep saturations more than 90%. Speech evaluation, PT OT evaluation.  2.  Hypertension: Hypotensive on arrival.  Adequately resuscitated.  On low-dose beta-blockers that we will continue to avoid tachycardia.  Close monitoring.  3.  History of CVA: No new neurological deficit.  Patient is  on aspirin, statin that he will continue.  4.  Hyperlipidemia: Patient is statin that he will continue.  Tolerating well.   DVT prophylaxis: Subcu Lovenox Code Status: Full code Family Communication: None Disposition Plan: Inpatient hospitalization and home after improvement. Consults called: None Admission status: Inpatient.  Telemetry.   Barb Merino MD [facility] Pager [phone]  If 7PM-7AM, please contact night-coverage [email or web address] Password [facility]  09/19/2018, 8:45 AM

## 2018-09-19 NOTE — Progress Notes (Signed)
MBS completed, Full report to follow.  Moderate oropharyngeal dysphagia noted with suspected esophageal component.  Recommend continue diet of dys3/thin with strict aspiration mitigation strategies.    No aspiration observed during testing despite taxing pt to sequential swallows.  He was tested with thin, nectar, pudding, cracker and barium tablet with pudding.  Pt does continue with mechanical dysphagia due to his kyphosis preventing adequate epiglottic deflection (epiglottis touches posterior pharyngeal wall without adequate deflection)  thus allowing vallecular space residuals.  He is however able to decrease residuals with dry swallows and clear remainder with "hock" and expectoration.  Minimal pyriform sinus residuals of liquids noted.   Suspect contribution of esophageal dysphagia given barium tablet provided with pudding appeared to lodge in proximal mid-esophagus WITHOUT pt sensation.  Pt also appeared with slow clearance of liquids, extra bolus of pudding facilitated transiting into stomach from esophagus.  Esophagus appears dilated with one area of decreased opening consistent with possible dysmotility.  Recommend consider dedicated esophageal evaluation *using caution with pharyngeal dysphagia* to assess esophageal function and its possible role in pt's chronic aspiration.    Using teach back with video and written instructions, pt educated to findings. Will follow up for help in dysphagia management.  Thanks for this consult.        [name], [place] [facility] SLP Acute Rehab Services Pager [phone] Office [phone]

## 2018-09-19 NOTE — Progress Notes (Signed)
Modified Barium Swallow Progress Note  Patient Details  Name: [name] MRN: [id number] Date of Birth: May 20, 1931  Today's Date: 09/19/2018  Modified Barium Swallow completed.  Full report located under Chart Review in the Imaging Section.  Brief recommendations include the following:  Clinical Impression  Patient presents with mostly obstructive pharyngeal dysphagia due to his kyphosis impairing epiglottic deflection and thus allowing significant vallecular residuals across all consistencies.  He also demonstrates decreased laryngeal closure which contributes to mild residuals in pyriform sinuses.    Aspiration was NOT observed during testing despite taxing pt to sequential swallows of purees and liquids.  He is however able to decrease pharyngeal residuals with dry swallows and clear remainder with "hock" and expectoration.  Minimal pyriform sinus residuals of liquids noted. He was tested with thin, nectar, pudding, cracker and barium tablet with puree.    Suspect contribution of esophageal dysphagia given barium tablet provided with pudding appeared to lodge in proximal mid-esophagus WITHOUT pt sensation.    Pt also appeared with slow clearance of liquids, extra bolus of pudding facilitated transiting into stomach from esophagus.  Esophagus appears dilated with one area of decreased opening consistent with possible dysmotility.     Recommend consider dedicated esophageal evaluation *using caution with pharyngeal dysphagia* to assess esophageal function and its possible role in pt's chronic aspiration.  His kyphosis impairs his pharyngeal and esophageal clearance.     Swallow Evaluation Recommendations   Recommended Consults: Consider GI evaluation;Consider esophageal assessment   SLP Diet Recommendations: Dysphagia 3 (Mech soft) solids;Thin liquid   Liquid Administration via: Cup;Straw   Medication Administration: Whole meds with puree(start and follow with liquids)   Supervision: Full assist for feeding;Full supervision/cueing for compensatory strategies   Compensations: Slow rate;Small sips/bites;Follow solids with liquid("hock" and expectorate during intake to clear pharynx)   Postural Changes: Remain semi-upright after after feeds/meals (Comment);Seated upright at 90 degrees   Oral Care Recommendations: Oral care QID   Other Recommendations: Have oral suction available   [name], MS [facility] SLP Acute Rehab Services Pager [phone] Office [phone]  [name] 09/19/2018,5:43 PM

## 2018-09-19 NOTE — ED Notes (Signed)
Attempted report x1. 

## 2018-09-19 NOTE — Progress Notes (Signed)
Called and updated patient's son. Discussed with speech therapy. Apparently he is recently experiencing more dysphagia and difficulty with swallowing pills.  Suspect esophageal dysphagia, will need GI evaluation for EGD. Consult in AM. May do EGD after some improvement of BP and oxygenation.

## 2018-09-19 NOTE — ED Notes (Signed)
Pt transported to imaging for swallow study.

## 2018-09-19 NOTE — ED Notes (Signed)
Date and time results received: 09/19/18 0043 (use smartphrase ".now" to insert current time)  Test: Lactic acid Critical Value: 3.0  Name of Provider Notified: Dr. Wyvonnia Dusky  Orders Received? Or Actions Taken?: Orders Received - See Orders for details

## 2018-09-20 ENCOUNTER — Inpatient Hospital Stay (HOSPITAL_COMMUNITY): Payer: Medicare Other

## 2018-09-20 DIAGNOSIS — A419 Sepsis, unspecified organism: Principal | ICD-10-CM

## 2018-09-20 DIAGNOSIS — E785 Hyperlipidemia, unspecified: Secondary | ICD-10-CM

## 2018-09-20 DIAGNOSIS — I159 Secondary hypertension, unspecified: Secondary | ICD-10-CM

## 2018-09-20 DIAGNOSIS — J690 Pneumonitis due to inhalation of food and vomit: Secondary | ICD-10-CM

## 2018-09-20 LAB — BLOOD CULTURE ID PANEL (REFLEXED)

## 2018-09-20 LAB — BASIC METABOLIC PANEL
Anion gap: 6 (ref 5–15)
BUN: 8 mg/dL (ref 8–23)
CO2: 23 mmol/L (ref 22–32)
Calcium: 8 mg/dL — ABNORMAL LOW (ref 8.9–10.3)
Chloride: 110 mmol/L (ref 98–111)
Creatinine, Ser: 0.59 mg/dL — ABNORMAL LOW (ref 0.61–1.24)
GFR calc Af Amer: >60 mL/min (ref >60)
GFR calc non Af Amer: >60 mL/min (ref >60)
Glucose, Bld: 83 mg/dL (ref 70–99)
Potassium: 3.6 mmol/L (ref 3.5–5.1)
Sodium: 139 mmol/L (ref 135–145)

## 2018-09-20 LAB — LEGIONELLA PNEUMOPHILA SEROGP 1 UR AG: L. pneumophila Serogp 1 Ur Ag: NEGATIVE

## 2018-09-20 LAB — URINE CULTURE: Culture: NO GROWTH

## 2018-09-20 LAB — CBC
HCT: 40.8 % (ref 39.0–52.0)
Hemoglobin: 12.8 g/dL — ABNORMAL LOW (ref 13.0–17.0)
MCH: 31.2 pg (ref 26.0–34.0)
MCHC: 31.4 g/dL (ref 30.0–36.0)
MCV: 99.5 fL (ref 80.0–100.0)
Platelets: 182 10*3/uL (ref 150–400)
RBC: 4.1 MIL/uL — ABNORMAL LOW (ref 4.22–5.81)
RDW: 14.5 % (ref 11.5–15.5)
WBC: 10.3 10*3/uL (ref 4.0–10.5)
nRBC: 0 % (ref 0.0–0.2)

## 2018-09-20 LAB — HIV ANTIBODY (ROUTINE TESTING W REFLEX): HIV Screen 4th Generation wRfx: NONREACTIVE

## 2018-09-20 NOTE — Consult Note (Signed)
Referring Provider:   Dr. Antonieta Pert Primary Care Physician:  [name], MD Primary Gastroenterologist:  None (unassigned)  Reason for Consultation:  Dysphagia  HPI: [name] is a 85 y.o. male with a 6 month h/o dysphagia, characterized by trouble transiting food boluses from his throat into his esophagus.  He does not report sx of esophageal dysphagia.  He has never had esophageal dilatation.  He has had recurrent pneumonia, thought to possibly be due to aspiration.  He has some sort of lesion on ENT exam which is being followed.   He has a h/o spinal osteoporosis.  Speech therapy eval showed mostly pharyngeal dysphagia, due to kyphosis-associated impairment of epiglottic deflection, with residuals.  However, it did appear on the modified barium swallow that the tablet lodged in the mid-esophagus, and that there was delayed clearance of contents into the stomach.  He reports a fair appetite, but thinks he has lost about 10 lbs gradually.  He has poor dentition, with most of his teeth missing.       Past Medical History:  Diagnosis Date  . Arthritis    hands   . BPH (benign prostatic hyperplasia)   . COPD (chronic obstructive pulmonary disease) ([place])   . Coronary artery disease   . CVA (cerebral infarction) 3 years ago   mild stroke  . HLD (hyperlipidemia)   . Hypertension   . Osteoporosis   . Pneumonia   . 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 SCC (squamous cell carcinoma) in situ 12/12/2017   left cheek inf.  . 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 BACK SURGERY    . LAPAROSCOPIC APPENDECTOMY N/A 11/02/2013   Procedure: APPENDECTOMY LAPAROSCOPIC;  Surgeon: Donnie Mesa, MD;  Location: [facility];  Service: General;  Laterality: N/A;    Prior to Admission medications   Medication Sig Start Date End Date Taking? Authorizing Provider   acetaminophen (TYLENOL) 500 MG tablet Take 500-1,000 mg by mouth every 8 (eight) hours as needed for mild pain or headache.    Yes [provider]  alendronate (FOSAMAX) 70 MG tablet Take 1 tablet (70 mg total) by mouth every Friday. take with a full glass of water on an empty stomach 08/17/18  Yes Renato Shin, MD  aspirin EC 81 MG tablet Take 81 mg by mouth daily.   Yes [provider]  atenolol (TENORMIN) 25 MG tablet Take 1 tablet (25 mg total) by mouth daily. 07/04/18  Yes [name], MD  atorvastatin (LIPITOR) 20 MG tablet TAKE ONE TABLET BY MOUTH DAILY Patient taking differently: Take 20 mg by mouth daily.  09/03/18  Yes [name], MD  calcium-vitamin D (OSCAL WITH D) 500-200 MG-UNIT tablet Take 1 tablet by mouth 2 (two) times daily.   Yes [provider]  Cholecalciferol (VITAMIN D-3) 1000 units CAPS Take 1,000 Units by mouth 2 (two) times a day.    Yes [provider]  denosumab (PROLIA) 60 MG/ML SOSY injection Inject 60 mg into the skin every 6 (six) months.   Yes [provider]  feeding supplement, ENSURE ENLIVE, (ENSURE ENLIVE) LIQD Take 237 mLs by mouth 3 (three) times daily between meals. Patient taking differently: Take 237 mLs by mouth 2 (two) times daily between meals.  05/31/18  Yes Black, Lezlie Octave, NP  fluticasone (FLOVENT HFA)  110 MCG/ACT inhaler USE 2 INHALATIONS TWICE A DAY Patient taking differently: Inhale 2 puffs into the lungs 2 (two) times a day.  08/15/18  Yes [name], MD  ipratropium (ATROVENT HFA) 17 MCG/ACT inhaler Inhale 2 puffs into the lungs every 6 (six) hours as needed for wheezing. 10/16/17  Yes [name], MD  lidocaine (LIDODERM) 5 % Place 1 patch onto the skin daily. Remove & Discard patch within 12 hours or as directed by MD 06/01/18  Yes Black, Lezlie Octave, NP  Multiple Vitamin (MULTIVITAMIN WITH MINERALS) TABS tablet Take 1 tablet by mouth daily. 06/01/18  Yes Black, Lezlie Octave, NP   Polyethyl Glycol-Propyl Glycol (SYSTANE) 0.4-0.3 % SOLN Place 1 drop into both eyes 3 (three) times daily as needed (dry eyes).    Yes [provider]  tamsulosin (FLOMAX) 0.4 MG CAPS capsule TAKE ONE CAPSULE BY MOUTH DAILY Patient taking differently: Take 0.4 mg by mouth daily.  08/01/18  Yes [name], MD  traMADol (ULTRAM) 50 MG tablet Take 50 mg by mouth every 6 (six) hours as needed for moderate pain.    Yes [provider]    Current Facility-Administered Medications  Medication Dose Route Frequency Provider Last Rate Last Dose  . 0.9 %  sodium chloride infusion   Intravenous Continuous Barb Merino, MD 100 mL/hr at 09/20/18 0658    . acetaminophen (TYLENOL) tablet 650 mg  650 mg Oral Q6H PRN Barb Merino, MD   650 mg at 09/19/18 1858   Or  . acetaminophen (TYLENOL) suppository 650 mg  650 mg Rectal Q6H PRN Barb Merino, MD      . ampicillin-sulbactam (UNASYN) 1.5 g in sodium chloride 0.9 % 100 mL IVPB  1.5 g Intravenous Q6H Ghimire, Dante Gang, MD 200 mL/hr at 09/20/18 1314 1.5 g at 09/20/18 1314  . aspirin EC tablet 81 mg  81 mg Oral Daily Barb Merino, MD   81 mg at 09/20/18 0930  . atenolol (TENORMIN) tablet 25 mg  25 mg Oral Daily Barb Merino, MD   25 mg at 09/20/18 0930  . atorvastatin (LIPITOR) tablet 20 mg  20 mg Oral Daily Barb Merino, MD   20 mg at 09/20/18 0930  . budesonide (PULMICORT) nebulizer solution 1 mg  1 mg Nebulization BID Barb Merino, MD   0.5 mg at 09/20/18 3546  . calcium-vitamin D (OSCAL WITH D) 500-200 MG-UNIT per tablet 1 tablet  1 tablet Oral BID Barb Merino, MD   1 tablet at 09/20/18 0930  . chlorhexidine (PERIDEX) 0.12 % solution 15 mL  15 mL Mouth Rinse BID Barb Merino, MD   15 mL at 09/20/18 0930  . cholecalciferol (VITAMIN D) tablet 1,000 Units  1,000 Units Oral BID Barb Merino, MD   1,000 Units at 09/20/18 0930  . enoxaparin (LOVENOX) injection 40 mg  40 mg Subcutaneous Q24H Barb Merino, MD   40 mg at  09/19/18 2154  . feeding supplement (ENSURE ENLIVE) (ENSURE ENLIVE) liquid 237 mL  237 mL Oral BID BM Barb Merino, MD   237 mL at 09/20/18 0930  . MEDLINE mouth rinse  15 mL Mouth Rinse q12n4p Barb Merino, MD      . multivitamin with minerals tablet 1 tablet  1 tablet Oral Daily Barb Merino, MD   1 tablet at 09/20/18 0930  . ondansetron (ZOFRAN) tablet 4 mg  4 mg Oral Q6H PRN Barb Merino, MD       Or  . ondansetron (ZOFRAN) injection 4 mg  4 mg Intravenous Q6H PRN Barb Merino, MD      . polyethylene glycol (MIRALAX / GLYCOLAX) packet 17 g  17 g Oral Daily PRN Barb Merino, MD      . polyvinyl alcohol (LIQUIFILM TEARS) 1.4 % ophthalmic solution 1 drop  1 drop Both Eyes TID PRN Barb Merino, MD      . tamsulosin (FLOMAX) capsule 0.4 mg  0.4 mg Oral Daily Barb Merino, MD   0.4 mg at 09/20/18 0930  . traMADol (ULTRAM) tablet 50 mg  50 mg Oral Q6H PRN Barb Merino, MD        Allergies as of 09/18/2018  . (No Known Allergies)    Family History  Problem Relation Age of Onset  . Colon cancer Brother   . Heart disease Brother   . Cancer Father   . Colon cancer Brother   . Aneurysm Sister     Social History   Socioeconomic History  . Marital status: Widowed    Spouse name: Not on file  . Number of children: Not on file  . Years of education: Not on file  . Highest education level: Not on file  Occupational History  . Occupation: Retired  Scientific laboratory technician  . Financial resource strain: Not on file  . Food insecurity    Worry: Not on file    Inability: Not on file  . Transportation needs    Medical: Not on file    Non-medical: Not on file  Tobacco Use  . Smoking status: Former Smoker    Packs/day: 2.00    Years: 20.00    Pack years: 40.00    Types: Cigarettes    Quit date: 02/29/1988    Years since quitting: 30.5  . Smokeless tobacco: Never Used  Substance and Sexual Activity  . Alcohol use: No    Alcohol/week: 0.0 standard drinks  . Drug use: No  .  Sexual activity: Not Currently  Lifestyle  . Physical activity    Days per week: Not on file    Minutes per session: Not on file  . Stress: Not on file  Relationships  . Social Herbalist on phone: Not on file    Gets together: Not on file    Attends religious service: Not on file    Active member of club or organization: Not on file    Attends meetings of clubs or organizations: Not on file    Relationship status: Not on file  . Intimate partner violence    Fear of current or ex partner: Not on file    Emotionally abused: Not on file    Physically abused: Not on file    Forced sexual activity: Not on file  Other Topics Concern  . Not on file  Social History Narrative  . Not on file    Review of Systems:  Has longstanding cerv lymph nodes, nothing new or different. Otherwise per HPI.  Physical Exam: Vital signs in last 24 hours: Temp:  [97.9 F (36.6 C)-100.6 F (38.1 C)] 97.9 F (36.6 C) (07/23 0636) Pulse Rate:  [58-71] 63 (07/23 0636) Resp:  [13-22] 18 (07/23 0636) BP: (109-149)/(59-75) 149/75 (07/23 0636) SpO2:  [91 %-100 %] 94 % (07/23 0636) Weight:  [63.3 kg] 63.3 kg (07/23 0500) Last BM Date: 09/18/18  Thin, pleasant, cognitively-preserved Caucasian male, NAD.  Small bilat post cervical nodes.  Only a few front teeth remain--otherwise almost edentulous.  Chest has signif inspir crackles left base.  Heart  sds distant but nl.  Abd scaphoid, NT. No clubbing, cyanosis, edema, focal neuro deficits, overt skin rashes.  Intake/Output from previous day: 07/22 0701 - 07/23 0700 In: 1996.4 [I.V.:1573; IV Piggyback:423.4] Out: 1600 [Urine:1600] Intake/Output this shift: Total I/O In: -  Out: 400 [Urine:400]  Lab Results: Recent Labs    09/18/18 2355 09/20/18 0533  WBC 20.3* 10.3  HGB 15.0 12.8*  HCT 47.7 40.8  PLT 221 182   BMET Recent Labs    09/18/18 2355 09/20/18 0533  NA 138 139  K 3.9 3.6  CL 106 110  CO2 22 23  GLUCOSE 200* 83  BUN 16  8  CREATININE 0.75 0.59*  CALCIUM 8.7* 8.0*   LFT Recent Labs    09/18/18 2355  PROT 5.7*  ALBUMIN 3.3*  AST 23  ALT 24  ALKPHOS 64  BILITOT 0.8   PT/INR Recent Labs    09/18/18 2355  LABPROT 14.8  INR 1.2    Studies/Results: Ct Angio Chest Pe W And/or Wo Contrast  Result Date: 09/19/2018 CLINICAL DATA:  Fever and shortness of breath EXAM: CT ANGIOGRAPHY CHEST WITH CONTRAST TECHNIQUE: Multidetector CT imaging of the chest was performed using the standard protocol during bolus administration of intravenous contrast. Multiplanar CT image reconstructions and MIPs were obtained to evaluate the vascular anatomy. CONTRAST:  162mL OMNIPAQUE IOHEXOL 350 MG/ML SOLN COMPARISON:  05/26/2018 FINDINGS: Cardiovascular: Normal heart size. No pericardial effusion. Extensive aortic and coronary calcification. Stenosis of great vessel ostia with limited quantification due to pulmonary artery timing. No evidence of pulmonary embolism with reasonable visualization to the segmental level. Mediastinum/Nodes: Negative for adenopathy or mass. Lungs/Pleura: Low volume chest. Centrilobular emphysema. Reticulation and airspace density in the lower lungs that was also seen previously, although increased. Pulmonary microlithiasis versus previously aspirated material. Negative for effusion or air leak Upper Abdomen: Reported separately Musculoskeletal: Remote T3, T4, T5, T6, T8, T9, T10, T11, and T12 compression fractures. Remote sternal body fracture. Remote rib fractures. Exaggerated kyphosis. No acute fracture. Review of the MIP images confirms the above findings. IMPRESSION: 1. Negative for pulmonary embolism. 2. Chronic interstitial lung disease at the bases with apically emphysema and low volume lungs from kyphotic deformity. Cannot exclude superimposed pneumonia at the lung bases given density has increased from comparison. 3. There is microlithiasis versus aspirated material in the lower lobes, question chronic  aspiration as cause of #2. Electronically Signed   By: Monte Fantasia M.D.   On: 09/19/2018 06:12   Ct Abdomen Pelvis W Contrast  Result Date: 09/19/2018 CLINICAL DATA:  Hypotension EXAM: CT ABDOMEN AND PELVIS WITH CONTRAST TECHNIQUE: Multidetector CT imaging of the abdomen and pelvis was performed using the standard protocol following bolus administration of intravenous contrast. CONTRAST:  136mL OMNIPAQUE IOHEXOL 350 MG/ML SOLN COMPARISON:  None. FINDINGS: Lower chest:  Reported on dedicated study Hepatobiliary: No focal liver abnormality.Cholelithiasis without evidence of cholecystitis. Pancreas: Unremarkable. Spleen: Unremarkable. Adrenals/Urinary Tract: Negative adrenals. No hydronephrosis or ureteral stone. Limited detection of renal calculi given there is already excretion of contrast following chest CTA. Left renal cystic density that is small. Distorted bladder from enlarged prostate. Small layering calcifications in the dependent bladder. Stomach/Bowel: No obstruction. Appendectomy. Negative for bowel inflammation. Vascular/Lymphatic: Diffuse atherosclerotic calcification. Visceral stenoses there likely high-grade, but limited by technique. No mass or adenopathy. Reproductive:Symmetric enlargement of prostate gland. Other: No ascites or pneumoperitoneum. Musculoskeletal: Prominent osteopenia with endplate fractures throughout the lumbar and lower thoracic spine. Sacral plasty and multilevel vertebroplasty. No acute finding. IMPRESSION: 1.  No acute finding. 2. Cholelithiasis. 3. Advanced atherosclerosis with visceral stenoses. Electronically Signed   By: Monte Fantasia M.D.   On: 09/19/2018 06:17   Dg Chest Port 1 View  Result Date: 09/19/2018 CLINICAL DATA:  85 year old male with weakness. EXAM: PORTABLE CHEST 1 VIEW COMPARISON:  Chest radiograph dated 07/05/2018 FINDINGS: Evaluation is limited due to positioning of the patient and superimposition of the mandible over the upper lung. Shallow  inspiration with bibasilar atelectasis. Diffuse chronic interstitial coarsening. No new consolidative change. There is no pleural effusion or pneumothorax. Stable cardiac silhouette. No acute osseous pathology. Osteopenia with degenerative changes of the spine and vertebroplasty changes. IMPRESSION: No active disease. Electronically Signed   By: Anner Crete M.D.   On: 09/19/2018 00:41   Dg Swallowing Func-speech Pathology  Result Date: 09/19/2018 Objective Swallowing Evaluation: Type of Study: MBS-Modified Barium Swallow Study  Patient Details Name: [name] MRN: [id number] Date of Birth: March 19, 1931 Today's Date: 09/19/2018 Time: SLP Start Time (ACUTE ONLY): 4098 -SLP Stop Time (ACUTE ONLY): 1500 SLP Time Calculation (min) (ACUTE ONLY): 38 min Past Medical History: Past Medical History: Diagnosis Date . Arthritis   hands  . BPH (benign prostatic hyperplasia)  . COPD (chronic obstructive pulmonary disease) ([place])  . Coronary artery disease  . CVA (cerebral infarction) 3 years ago  mild stroke . HLD (hyperlipidemia)  . Hypertension  . Osteoporosis  . Pneumonia  . 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 SCC (squamous cell carcinoma) in situ 12/12/2017  left cheek inf. . 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 BACK SURGERY   . LAPAROSCOPIC APPENDECTOMY N/A 11/02/2013  Procedure: APPENDECTOMY LAPAROSCOPIC;  Surgeon: Donnie Mesa, MD;  Location: [facility];  Service: General;  Laterality: N/A; HPI: 85 y.o. male with medical history significant of ambulatory difficulties, previous history of stroke, COPD, suspected aspiration, hypertension and coronary artery disease presenting to the emergency room with sudden onset of difficulty walking and temperature of 101 at home.  Patient is poor historian.  According to him he does walk around the house with a cane.  He had no issues until yesterday.   He said he had swallowing test done that showed mostly obstructive pharyngeal dysphagia with kyphosis impairing epiglottic deflection.    Aspiraiton of thin and wet voice at baseline - now presenting with pna again and deconditioned recommend repeat MBS.  Also note pt is a full code and has recurrent pna thus recommend consider pallative care referral to establish [place].  Pt CT chest shows concerns for chronic aspiration.   Subjective: pt presents for MBS Assessment / Plan / Recommendation CHL IP CLINICAL IMPRESSIONS 09/19/2018 Clinical Impression Patient presents with mostly obstructive pharyngeal dysphagia due to his kyphosis impairing epiglottic deflection and thus allowing significant vallecular residuals across all consistencies.  He also demonstrates decreased laryngeal closure which contributes to mild residuals in pyriform sinuses.  Aspiration was NOT observed during testing despite taxing pt to sequential swallows of purees and liquids.  He is however able to decrease pharyngeal residuals with dry swallows and clear remainder with "hock" and expectoration.  Minimal pyriform sinus residuals of liquids noted. He was tested with thin, nectar, pudding, cracker and barium tablet with puree.  Suspect contribution of esophageal dysphagia given barium tablet provided with pudding appeared to lodge in proximal mid-esophagus WITHOUT pt sensation.  Pt also appeared with slow clearance of liquids,  extra bolus of pudding facilitated transiting into stomach from esophagus.  Esophagus appears dilated with one area of decreased opening consistent with possible dysmotility.  Recommend consider dedicated esophageal evaluation *using caution with pharyngeal dysphagia* to assess esophageal function and its possible role in pt's chronic aspiration.  His kyphosis impairs his pharyngeal and esophageal clearance.   SLP Visit Diagnosis Dysphagia, pharyngeal phase (R13.13) Attention and concentration deficit following -- Frontal lobe  and executive function deficit following -- Impact on safety and function Moderate aspiration risk   CHL IP TREATMENT RECOMMENDATION 09/19/2018 Treatment Recommendations Therapy as outlined in treatment plan below   Prognosis 09/19/2018 Prognosis for Safe Diet Advancement Fair Barriers to Reach Goals Time post onset Barriers/Prognosis Comment COPD increases pna risk with aspiration CHL IP DIET RECOMMENDATION 09/19/2018 SLP Diet Recommendations Dysphagia 3 (Mech soft) solids;Thin liquid Liquid Administration via Cup;Straw Medication Administration Whole meds with puree Compensations Slow rate;Small sips/bites;Follow solids with liquid Postural Changes Remain semi-upright after after feeds/meals (Comment);Seated upright at 90 degrees   CHL IP OTHER RECOMMENDATIONS 09/19/2018 Recommended Consults Consider GI evaluation;Consider esophageal assessment Oral Care Recommendations Oral care QID Other Recommendations Have oral suction available   CHL IP FOLLOW UP RECOMMENDATIONS 09/19/2018 Follow up Recommendations Home health SLP;Skilled Nursing facility   [facility] IP FREQUENCY AND DURATION 09/19/2018 Speech Therapy Frequency (ACUTE ONLY) min 2x/week Treatment Duration 1 week      CHL IP ORAL PHASE 09/19/2018 Oral Phase WFL Oral - Pudding Teaspoon -- Oral - Pudding Cup -- Oral - Honey Teaspoon -- Oral - Honey Cup -- Oral - Nectar Teaspoon -- Oral - Nectar Cup -- Oral - Nectar Straw -- Oral - Thin Teaspoon -- Oral - Thin Cup -- Oral - Thin Straw -- Oral - Puree -- Oral - Mech Soft -- Oral - Regular -- Oral - Multi-Consistency -- Oral - Pill -- Oral Phase - Comment --  CHL IP PHARYNGEAL PHASE 09/19/2018 Pharyngeal Phase Impaired Pharyngeal- Pudding Teaspoon -- Pharyngeal -- Pharyngeal- Pudding Cup -- Pharyngeal -- Pharyngeal- Honey Teaspoon -- Pharyngeal -- Pharyngeal- Honey Cup -- Pharyngeal -- Pharyngeal- Nectar Teaspoon -- Pharyngeal -- Pharyngeal- Nectar Cup Reduced epiglottic inversion;Reduced anterior laryngeal mobility;Reduced  laryngeal elevation;Reduced airway/laryngeal closure;Reduced tongue base retraction;Pharyngeal residue - valleculae;Pharyngeal residue - pyriform Pharyngeal -- Pharyngeal- Nectar Straw Reduced pharyngeal peristalsis;Reduced epiglottic inversion;Reduced anterior laryngeal mobility;Reduced laryngeal elevation;Reduced airway/laryngeal closure;Reduced tongue base retraction;Pharyngeal residue - valleculae;Pharyngeal residue - pyriform Pharyngeal -- Pharyngeal- Thin Teaspoon -- Pharyngeal -- Pharyngeal- Thin Cup Reduced epiglottic inversion;Reduced anterior laryngeal mobility;Reduced laryngeal elevation;Reduced airway/laryngeal closure;Reduced tongue base retraction;Pharyngeal residue - valleculae;Pharyngeal residue - pyriform Pharyngeal -- Pharyngeal- Thin Straw Reduced epiglottic inversion;Reduced anterior laryngeal mobility;Reduced laryngeal elevation;Reduced airway/laryngeal closure;Reduced tongue base retraction;Pharyngeal residue - valleculae;Pharyngeal residue - pyriform Pharyngeal -- Pharyngeal- Puree Reduced epiglottic inversion;Reduced anterior laryngeal mobility;Reduced laryngeal elevation;Reduced airway/laryngeal closure;Reduced tongue base retraction;Pharyngeal residue - valleculae Pharyngeal -- Pharyngeal- Mechanical Soft Reduced epiglottic inversion;Reduced anterior laryngeal mobility;Reduced laryngeal elevation;Reduced airway/laryngeal closure;Reduced tongue base retraction;Pharyngeal residue - valleculae Pharyngeal -- Pharyngeal- Regular -- Pharyngeal -- Pharyngeal- Multi-consistency -- Pharyngeal -- Pharyngeal- Pill Reduced epiglottic inversion;Reduced anterior laryngeal mobility;Reduced laryngeal elevation;Reduced airway/laryngeal closure;Reduced tongue base retraction;Pharyngeal residue - valleculae Pharyngeal -- Pharyngeal Comment cues to dry swallow and cough/expectorate were helpful to decrease residuals  CHL IP CERVICAL ESOPHAGEAL PHASE 09/19/2018 Cervical Esophageal Phase Impaired Pudding Teaspoon  -- Pudding Cup -- Honey Teaspoon -- Honey Cup -- Nectar Teaspoon -- Nectar Cup -- Nectar Straw -- Thin Teaspoon -- Thin Cup -- Thin Straw -- Puree -- Mechanical Soft -- Regular -- Multi-consistency --  Pill -- Cervical Esophageal Comment -- [name], MS [facility] SLP Acute Rehab Services Pager [phone] Office [phone] [name] 09/19/2018, 5:44 PM               Impression: 1. Pharyngeal dysphagia as per above description 2. 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  Unless a tight stricture is identified, do not anticipate further evaluation or GI interventions. I did review with the patient the chin tuck maneuver.   LOS: 1 day   [place]  09/20/2018, 2:19 PM   Pager [phone] If no answer or after 5 PM call [phone]

## 2018-09-20 NOTE — Evaluation (Addendum)
Physical Therapy Evaluation Patient Details Name: [name] MRN: [id number] DOB: 22-Sep-1931 Today's Date: 09/20/2018   History of Present Illness  85 y.o. male with medical history significant of ambulatory difficulties, previous history of stroke, COPD, suspected aspiration, hypertension and coronary artery disease presenting to the emergency room with sudden onset of difficulty walking and temperature of 101 at home. Dx of sepsis, PNA.  Clinical Impression  Pt admitted with above diagnosis. Pt currently with functional limitations due to the deficits listed below (see PT Problem List). Pt ambulated 120' with RW and 2L O2, pulse oximeter did not give a reading during ambulation. At rest, SaO2 was 94% on 2L. Will need to re-attempt obtaining walking O2 saturation levels. Pt will benefit from skilled PT to increase their independence and safety with mobility to allow discharge to the venue listed below.       Follow Up Recommendations Home health PT    Equipment Recommendations  Other (comment)(possibly supplemental O2, need to re-assess)    Recommendations for Other Services       Precautions / Restrictions Precautions Precautions: Fall Precaution Comments: monitor O2; pt reports 1 fall in March 2020 when he had PNA Restrictions Weight Bearing Restrictions: No      Mobility  Bed Mobility Overal bed mobility: Modified Independent             General bed mobility comments: HOB raised, extra time  Transfers Overall transfer level: Needs assistance Equipment used: Rolling walker (2 wheeled) Transfers: Sit to/from Stand Sit to Stand: Supervision         General transfer comment: pt steady; cues for UE placement  Ambulation/Gait Ambulation/Gait assistance:supervision Gait Distance (Feet): 120 Feet Assistive device: Rolling walker (2 wheeled) Gait Pattern/deviations: Step-through pattern;Trunk flexed Gait velocity: WFL   General Gait Details: steady with RW,  ambulated with 2L O2, pulse ox did not read while ambulating; SaO2 94% on 2L at rest  Stairs            Wheelchair Mobility    Modified Rankin (Stroke Patients Only)       Balance Overall balance assessment: Modified Independent                                           Pertinent Vitals/Pain Pain Assessment: No/denies pain    Home Living Family/patient expects to be discharged to:: Private residence Living Arrangements: Children;Other relatives           Home Layout: Two level;Able to live on main level with bedroom/bathroom Home Equipment: Gilford Rile - 2 wheels;Cane - single point;Walker - 4 wheels      Prior Function Level of Independence: Independent with assistive device(s)         Comments: has cane and RW     Hand Dominance   Dominant Hand: Right    Extremity/Trunk Assessment   Upper Extremity Assessment Upper Extremity Assessment: Defer to OT evaluation    Lower Extremity Assessment Lower Extremity Assessment: Overall WFL for tasks assessed    Cervical / Trunk Assessment Cervical / Trunk Assessment: Kyphotic  Communication   Communication: No difficulties  Cognition Arousal/Alertness: Awake/alert Behavior During Therapy: WFL for tasks assessed/performed Overall Cognitive Status: Within Functional Limits for tasks assessed  General Comments General comments (skin integrity, edema, etc.): SaO2 94 % on 2L O2 [place] at rest    Exercises     Assessment/Plan    PT Assessment Patient needs continued PT services  PT Problem List Decreased mobility;Decreased activity tolerance       PT Treatment Interventions Gait training;Functional mobility training;Therapeutic activities;Therapeutic exercise    PT Goals (Current goals can be found in the Care Plan section)  Acute Rehab PT Goals Patient Stated Goal: independent with mobility PT Goal Formulation: With patient Time For  Goal Achievement: 09/27/18 Potential to Achieve Goals: Good    Frequency Min 3X/week   Barriers to discharge        Co-evaluation               AM-PAC PT "6 Clicks" Mobility  Outcome Measure Help needed turning from your back to your side while in a flat bed without using bedrails?: None Help needed moving from lying on your back to sitting on the side of a flat bed without using bedrails?: None Help needed moving to and from a bed to a chair (including a wheelchair)?: None Help needed standing up from a chair using your arms (e.g., wheelchair or bedside chair)?: None Help needed to walk in hospital room?: None Help needed climbing 3-5 steps with a railing? : A Little 6 Click Score: 23    End of Session Equipment Utilized During Treatment: Gait belt;Oxygen Activity Tolerance: Patient tolerated treatment well Patient left: in bed;with call bell/phone within reach Nurse Communication: Mobility status PT Visit Diagnosis: Difficulty in walking, not elsewhere classified (R26.2)    Time: 1030-1047 PT Time Calculation (min) (ACUTE ONLY): 17 min   Charges:   PT Evaluation $PT Eval Low Complexity: 1 Low          .Blondell Reveal Kistler PT 09/20/2018  Acute Rehabilitation Services Pager [phone] Office [phone]

## 2018-09-20 NOTE — Progress Notes (Signed)
PROGRESS NOTE    [name]  DPO:[id number] DOB: 08/13/1931 DOA: 09/18/2018 PCP: [name], MD   Brief Narrative: As per HPI 85 y.o. male with medical history significant of ambulatory difficulties, previous history of stroke, COPD, suspected aspiration, hypertension and coronary artery disease presenting to the emergency room with sudden onset of difficulty walking and temperature of 101 at home.  Patient is poor historian.  According to him he does walk around the house with a cane.  He had no issues until yesterday.  He said he had swallowing test done 2 weeks ago and he is eating regular diet.  Patient tells me that last night about 8 PM he just felt very weak and was unable to walk.  They took temperature at home and it was 101.  Patient does have chronic cough.  Denies any sputum production.  Denies any headache, nausea, vomiting.  Denies any abdominal pain.  Denies any chest pain.  Denies any change in bowel or bladder habits.  Lives at home with son, no other family members with COVID-19 infection at home.  Does not use oxygen at baseline. ED Course: Noted hypotensive on arrival with blood pressure of 75/54, EMS also reported orthostatic.  Temperature was 101.1.  WBC 20.3.  Lactic acid was 3. CT scan of the chest abdomen pelvis were done, negative for acute PE.  Advance emphysematous changes, basal scarring and probable infiltrate.  Treated with IV fluid boluses with sepsis protocol, cultures were sent before restarting antibiotics, patient was given vancomycin, cefepime and Flagyl in the ER.  Patient also noted to be hypoxic with 82% on room air improved to 100% with 2 L  Subjective: Pleasantly on 2l [place]. No CP,SOB or cough. Tmax 100.6 overnight No acute events overnight.  Assessment & Plan:   Sepsis likely from aspiration pneumonia: tmax 100.6 overnight, wbc improved 20k->10k. We will cont Unasyn.SLP eval noted- cont Dysphagia 3 diet and gi eval for esophageal assesment.  Previous PNA end of marck- took long time to clear up per daughter.  Blood culture + 1/4 with [facility]- likely contamination, ID pharmacy reviewed, abs as above.   Suspected esophageal dysphagia, w recurrent pneumonia- GI eval. discussed with Dr. Cristina Gong and ordered esophageal barium eval w tablet. Per daughter he has been having small cougs all the time, daughter concerned abt possible aspiration when he drinks. Previous swallow eval showed aspiration w thin liquid.  Previous oxyygen need: after last pneumonia:has been off o2 x3-4 wk at home  HTN:BP controlled on atenelol.  History of CVA- on asa, lipitor  Hyperlipidemia-on lipitpr  DVT prophylaxis: Lovenox Code Status:full Family Communication: plan of care discussed with patient in detail.I called patient's daughter [name] and updated. Disposition Plan: Remains inpatient pending clinical improvement. Was in SNF after last PNA. PT/OT consulted.  Consultants:  GI  Procedures: None  Microbiology: Blood cx pending  Antimicrobials: Anti-infectives (From admission, onward)   Start     Dose/Rate Route Frequency Ordered Stop   09/19/18 1200  ampicillin-sulbactam (UNASYN) 1.5 g in sodium chloride 0.9 % 100 mL IVPB     1.5 g 200 mL/hr over 30 Minutes Intravenous Every 6 hours 09/19/18 1033     09/19/18 0000  vancomycin (VANCOCIN) 1,250 mg in sodium chloride 0.9 % 250 mL IVPB     1,250 mg 166.7 mL/hr over 90 Minutes Intravenous  Once 09/18/18 2355 09/19/18 0340   09/18/18 2345  ceFEPIme (MAXIPIME) 2 g in sodium chloride 0.9 % 100 mL IVPB  2 g 200 mL/hr over 30 Minutes Intravenous  Once 09/18/18 2343 09/19/18 0203   09/18/18 2345  metroNIDAZOLE (FLAGYL) IVPB 500 mg     500 mg 100 mL/hr over 60 Minutes Intravenous  Once 09/18/18 2343 09/19/18 0203   09/18/18 2345  vancomycin (VANCOCIN) IVPB 1000 mg/200 mL premix  Status:  Discontinued     1,000 mg 200 mL/hr over 60 Minutes Intravenous  Once 09/18/18 2343 09/18/18 2355        Objective: Vitals:   09/19/18 1942 09/19/18 2134 09/20/18 0500 09/20/18 0636  BP:  109/61  (!) 149/75  Pulse:  (!) 58  63  Resp:  18  18  Temp:  98.5 F (36.9 C)  97.9 F (36.6 C)  TempSrc:  Oral  Oral  SpO2: 100% 99%  94%  Weight:   63.3 kg   Height:        Intake/Output Summary (Last 24 hours) at 09/20/2018 0902 Last data filed at 09/20/2018 0658 Gross per 24 hour  Intake 1996.41 ml  Output 1600 ml  Net 396.41 ml   Filed Weights   09/19/18 0000 09/20/18 0500  Weight: 61.2 kg 63.3 kg   Weight change: 2.064 kg  Body mass index is 18.41 kg/m.  Intake/Output from previous day: 07/22 0701 - 07/23 0700 In: 1996.4 [I.V.:1573; IV Piggyback:423.4] Out: 1600 [Urine:1600] Intake/Output this shift: No intake/output data recorded.  Examination:  General exam: Appears calm and comfortable,Not in distress, older fore the age HEENT:PERRL,Oral mucosa moist, Ear/Nose normal on gross exam Respiratory system: Bilateral equal air entry, normal vesicular breath sounds, no wheezes or crackles  Cardiovascular system: S1 & S2 heard,No JVD, murmurs. Gastrointestinal system: Abdomen is  soft, non tender, non distended, BS +  Nervous System:Alert and oriented. No focal neurological deficits/moving extremities, sensation intact. Extremities: No edema, no clubbing, distal peripheral pulses palpable. Skin: No rashes, lesions, no icterus MSK: Normal muscle bulk,tone ,power  Medications:  Scheduled Meds:  aspirin EC  81 mg Oral Daily   atenolol  25 mg Oral Daily   atorvastatin  20 mg Oral Daily   budesonide  1 mg Nebulization BID   calcium-vitamin D  1 tablet Oral BID   chlorhexidine  15 mL Mouth Rinse BID   cholecalciferol  1,000 Units Oral BID   enoxaparin (LOVENOX) injection  40 mg Subcutaneous Q24H   feeding supplement (ENSURE ENLIVE)  237 mL Oral BID BM   mouth rinse  15 mL Mouth Rinse q12n4p   multivitamin with minerals  1 tablet Oral Daily   tamsulosin  0.4 mg  Oral Daily   Continuous Infusions:  sodium chloride 100 mL/hr at 09/20/18 0658   ampicillin-sulbactam (UNASYN) IV 1.5 g (09/20/18 0620)    Data Reviewed: I have personally reviewed following labs and imaging studies  CBC: Recent Labs  Lab 09/18/18 2355 09/20/18 0533  WBC 20.3* 10.3  NEUTROABS 18.3*  --   HGB 15.0 12.8*  HCT 47.7 40.8  MCV 96.4 99.5  PLT 221 938   Basic Metabolic Panel: Recent Labs  Lab 09/18/18 2355 09/20/18 0533  NA 138 139  K 3.9 3.6  CL 106 110  CO2 22 23  GLUCOSE 200* 83  BUN 16 8  CREATININE 0.75 0.59*  CALCIUM 8.7* 8.0*   GFR: Estimated Creatinine Clearance: 61.5 mL/min (A) (by C-G formula based on SCr of 0.59 mg/dL (L)). Liver Function Tests: Recent Labs  Lab 09/18/18 2355  AST 23  ALT 24  ALKPHOS 64  BILITOT 0.8  PROT 5.7*  ALBUMIN 3.3*   No results for input(s): LIPASE, AMYLASE in the last 168 hours. No results for input(s): AMMONIA in the last 168 hours. Coagulation Profile: Recent Labs  Lab 09/18/18 2355  INR 1.2   Cardiac Enzymes: No results for input(s): CKTOTAL, CKMB, CKMBINDEX, TROPONINI in the last 168 hours. BNP (last 3 results) No results for input(s): PROBNP in the last 8760 hours. HbA1C: No results for input(s): HGBA1C in the last 72 hours. CBG: No results for input(s): GLUCAP in the last 168 hours. Lipid Profile: No results for input(s): CHOL, HDL, LDLCALC, TRIG, CHOLHDL, LDLDIRECT in the last 72 hours. Thyroid Function Tests: No results for input(s): TSH, T4TOTAL, FREET4, T3FREE, THYROIDAB in the last 72 hours. Anemia Panel: No results for input(s): VITAMINB12, FOLATE, FERRITIN, TIBC, IRON, RETICCTPCT in the last 72 hours. Sepsis Labs: Recent Labs  Lab 09/18/18 2355 09/19/18 1653  LATICACIDVEN 1.3   3.0* 0.8    Recent Results (from the past 240 hour(s))  Blood Culture (routine x 2)     Status: None (Preliminary result)   Collection Time: 09/18/18 11:55 PM   Specimen: BLOOD LEFT ARM  Result Value  Ref Range Status   Specimen Description   Final    BLOOD LEFT ARM Performed at [facility] [address] [place], [place] [address]    Special Requests   Final    BOTTLES DRAWN AEROBIC AND ANAEROBIC Blood Culture adequate volume Performed at [facility] [address] [place], [place] [address]    Culture PENDING  Incomplete   Report Status PENDING  Incomplete  Blood Culture (routine x 2)     Status: None (Preliminary result)   Collection Time: 09/18/18 11:55 PM   Specimen: BLOOD RIGHT ARM  Result Value Ref Range Status   Specimen Description   Final    BLOOD RIGHT ARM Performed at [facility], [place] [address], [place], [place] [address]    Special Requests   Final    BOTTLES DRAWN AEROBIC AND ANAEROBIC Blood Culture adequate volume Performed at [facility] [address] [place], [place] [address]    Culture  Setup Time   Final    IN BOTH AEROBIC AND ANAEROBIC BOTTLES GRAM POSITIVE COCCI Organism ID to follow Performed at [facility], [place] [address], [place], [place] [address]    Culture PENDING  Incomplete   Report Status PENDING  Incomplete  Urine culture     Status: None   Collection Time: 09/18/18 11:55 PM   Specimen: In/Out Cath Urine  Result Value Ref Range Status   Specimen Description   Final    IN/OUT CATH URINE Performed at [facility], [place] [address], [place], [place] [address]    Special Requests   Final    NONE Performed at [facility], [place] [address] [place], [place] [address]    Culture   Final    NO GROWTH Performed at [facility], [place] [address], [place], [place] [address]    Report Status 09/20/2018 FINAL  Final  SARS Coronavirus 2 (CEPHEID- Performed in [facility] lab), Hosp Order     Status: None   Collection Time: 09/18/18 11:55 PM   Specimen: Nasopharyngeal Swab  Result Value Ref Range Status   SARS Coronavirus 2  NEGATIVE NEGATIVE Final    Comment: (NOTE) If result is NEGATIVE SARS-CoV-2 target nucleic acids are NOT DETECTED. The SARS-CoV-2 RNA  is generally detectable in upper and lower  respiratory specimens during the acute phase of infection. The lowest  concentration of SARS-CoV-2 viral copies this assay can detect is 250  copies / mL. A negative result does not preclude SARS-CoV-2 infection  and should not be used as the sole basis for treatment or other  patient management decisions.  A negative result may occur with  improper specimen collection / handling, submission of specimen other  than nasopharyngeal swab, presence of viral mutation(s) within the  areas targeted by this assay, and inadequate number of viral copies  (<250 copies / mL). A negative result must be combined with clinical  observations, patient history, and epidemiological information. If result is POSITIVE SARS-CoV-2 target nucleic acids are DETECTED. The SARS-CoV-2 RNA is generally detectable in upper and lower  respiratory specimens dur ing the acute phase of infection.  Positive  results are indicative of active infection with SARS-CoV-2.  Clinical  correlation with patient history and other diagnostic information is  necessary to determine patient infection status.  Positive results do  not rule out bacterial infection or co-infection with other viruses. If result is PRESUMPTIVE POSTIVE SARS-CoV-2 nucleic acids MAY BE PRESENT.   A presumptive positive result was obtained on the submitted specimen  and confirmed on repeat testing.  While 2019 novel coronavirus  (SARS-CoV-2) nucleic acids may be present in the submitted sample  additional confirmatory testing may be necessary for epidemiological  and / or clinical management purposes  to differentiate between  SARS-CoV-2 and other Sarbecovirus currently known to infect humans.  If clinically indicated additional testing with an alternate test  methodology [phone])  is advised. The SARS-CoV-2 RNA is generally  detectable in upper and lower respiratory sp ecimens during the acute  phase of infection. The expected result is Negative. Fact Sheet for Patients:  StrictlyIdeas.no Fact Sheet for Healthcare Providers: BankingDealers.co.za This test is not yet approved or cleared by the [place] FDA and has been authorized for detection and/or diagnosis of SARS-CoV-2 by FDA under an Emergency Use Authorization (EUA).  This EUA will remain in effect (meaning this test can be used) for the duration of the COVID-19 declaration under Section 564(b)(1) of the Act, 21 U.S.C. section 360bbb-3(b)(1), unless the authorization is terminated or revoked sooner. Performed at [facility], [place] [address] [place], [place] [address]       Radiology Studies: Ct Angio Chest Pe W And/or Wo Contrast  Result Date: 09/19/2018 CLINICAL DATA:  Fever and shortness of breath EXAM: CT ANGIOGRAPHY CHEST WITH CONTRAST TECHNIQUE: Multidetector CT imaging of the chest was performed using the standard protocol during bolus administration of intravenous contrast. Multiplanar CT image reconstructions and MIPs were obtained to evaluate the vascular anatomy. CONTRAST:  139mL OMNIPAQUE IOHEXOL 350 MG/ML SOLN COMPARISON:  05/26/2018 FINDINGS: Cardiovascular: Normal heart size. No pericardial effusion. Extensive aortic and coronary calcification. Stenosis of great vessel ostia with limited quantification due to pulmonary artery timing. No evidence of pulmonary embolism with reasonable visualization to the segmental level. Mediastinum/Nodes: Negative for adenopathy or mass. Lungs/Pleura: Low volume chest. Centrilobular emphysema. Reticulation and airspace density in the lower lungs that was also seen previously, although increased. Pulmonary microlithiasis versus previously aspirated material. Negative for effusion or air leak Upper  Abdomen: Reported separately Musculoskeletal: Remote T3, T4, T5, T6, T8, T9, T10, T11, and T12 compression fractures. Remote sternal body fracture. Remote rib fractures. Exaggerated kyphosis. No acute fracture. Review of the MIP images confirms the above findings. IMPRESSION:  1. Negative for pulmonary embolism. 2. Chronic interstitial lung disease at the bases with apically emphysema and low volume lungs from kyphotic deformity. Cannot exclude superimposed pneumonia at the lung bases given density has increased from comparison. 3. There is microlithiasis versus aspirated material in the lower lobes, question chronic aspiration as cause of #2. Electronically Signed   By: Monte Fantasia M.D.   On: 09/19/2018 06:12   Ct Abdomen Pelvis W Contrast  Result Date: 09/19/2018 CLINICAL DATA:  Hypotension EXAM: CT ABDOMEN AND PELVIS WITH CONTRAST TECHNIQUE: Multidetector CT imaging of the abdomen and pelvis was performed using the standard protocol following bolus administration of intravenous contrast. CONTRAST:  168mL OMNIPAQUE IOHEXOL 350 MG/ML SOLN COMPARISON:  None. FINDINGS: Lower chest:  Reported on dedicated study Hepatobiliary: No focal liver abnormality.Cholelithiasis without evidence of cholecystitis. Pancreas: Unremarkable. Spleen: Unremarkable. Adrenals/Urinary Tract: Negative adrenals. No hydronephrosis or ureteral stone. Limited detection of renal calculi given there is already excretion of contrast following chest CTA. Left renal cystic density that is small. Distorted bladder from enlarged prostate. Small layering calcifications in the dependent bladder. Stomach/Bowel: No obstruction. Appendectomy. Negative for bowel inflammation. Vascular/Lymphatic: Diffuse atherosclerotic calcification. Visceral stenoses there likely high-grade, but limited by technique. No mass or adenopathy. Reproductive:Symmetric enlargement of prostate gland. Other: No ascites or pneumoperitoneum. Musculoskeletal: Prominent  osteopenia with endplate fractures throughout the lumbar and lower thoracic spine. Sacral plasty and multilevel vertebroplasty. No acute finding. IMPRESSION: 1. No acute finding. 2. Cholelithiasis. 3. Advanced atherosclerosis with visceral stenoses. Electronically Signed   By: Monte Fantasia M.D.   On: 09/19/2018 06:17   Dg Chest Port 1 View  Result Date: 09/19/2018 CLINICAL DATA:  85 year old male with weakness. EXAM: PORTABLE CHEST 1 VIEW COMPARISON:  Chest radiograph dated 07/05/2018 FINDINGS: Evaluation is limited due to positioning of the patient and superimposition of the mandible over the upper lung. Shallow inspiration with bibasilar atelectasis. Diffuse chronic interstitial coarsening. No new consolidative change. There is no pleural effusion or pneumothorax. Stable cardiac silhouette. No acute osseous pathology. Osteopenia with degenerative changes of the spine and vertebroplasty changes. IMPRESSION: No active disease. Electronically Signed   By: Anner Crete M.D.   On: 09/19/2018 00:41   Dg Swallowing Func-speech Pathology  Result Date: 09/19/2018 Objective Swallowing Evaluation: Type of Study: MBS-Modified Barium Swallow Study  Patient Details Name: QUENTEZ LOBER MRN: [id number] Date of Birth: August 03, 1931 Today's Date: 09/19/2018 Time: SLP Start Time (ACUTE ONLY): 6 -SLP Stop Time (ACUTE ONLY): 1500 SLP Time Calculation (min) (ACUTE ONLY): 38 min Past Medical History: Past Medical History: Diagnosis Date  Arthritis   hands   BPH (benign prostatic hyperplasia)   COPD (chronic obstructive pulmonary disease) (HCC)   Coronary artery disease   CVA (cerebral infarction) 3 years ago  mild stroke  HLD (hyperlipidemia)   Hypertension   Osteoporosis   Pneumonia   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  SCC (squamous cell carcinoma) in situ x 2   right front scalp, left cheek Past Surgical History: Past  Surgical History: Procedure Laterality Date  BACK SURGERY    LAPAROSCOPIC APPENDECTOMY N/A 11/02/2013  Procedure: APPENDECTOMY LAPAROSCOPIC;  Surgeon: Donnie Mesa, MD;  Location: [facility];  Service: General;  Laterality: N/A; HPI: 85 y.o. male with medical history significant of ambulatory difficulties, previous history of stroke, COPD, suspected aspiration, hypertension and coronary artery disease presenting to the emergency room with sudden onset of difficulty walking and  temperature of 101 at home.  Patient is poor historian.  According to him he does walk around the house with a cane.  He had no issues until yesterday.  He said he had swallowing test done that showed mostly obstructive pharyngeal dysphagia with kyphosis impairing epiglottic deflection.    Aspiraiton of thin and wet voice at baseline - now presenting with pna again and deconditioned recommend repeat MBS.  Also note pt is a full code and has recurrent pna thus recommend consider pallative care referral to establish [place].  Pt CT chest shows concerns for chronic aspiration.   Subjective: pt presents for MBS Assessment / Plan / Recommendation CHL IP CLINICAL IMPRESSIONS 09/19/2018 Clinical Impression Patient presents with mostly obstructive pharyngeal dysphagia due to his kyphosis impairing epiglottic deflection and thus allowing significant vallecular residuals across all consistencies.  He also demonstrates decreased laryngeal closure which contributes to mild residuals in pyriform sinuses.  Aspiration was NOT observed during testing despite taxing pt to sequential swallows of purees and liquids.  He is however able to decrease pharyngeal residuals with dry swallows and clear remainder with "hock" and expectoration.  Minimal pyriform sinus residuals of liquids noted. He was tested with thin, nectar, pudding, cracker and barium tablet with puree.  Suspect contribution of esophageal dysphagia given barium tablet provided with pudding appeared to lodge  in proximal mid-esophagus WITHOUT pt sensation.  Pt also appeared with slow clearance of liquids, extra bolus of pudding facilitated transiting into stomach from esophagus.  Esophagus appears dilated with one area of decreased opening consistent with possible dysmotility.  Recommend consider dedicated esophageal evaluation *using caution with pharyngeal dysphagia* to assess esophageal function and its possible role in pt's chronic aspiration.  His kyphosis impairs his pharyngeal and esophageal clearance.   SLP Visit Diagnosis Dysphagia, pharyngeal phase (R13.13) Attention and concentration deficit following -- Frontal lobe and executive function deficit following -- Impact on safety and function Moderate aspiration risk   CHL IP TREATMENT RECOMMENDATION 09/19/2018 Treatment Recommendations Therapy as outlined in treatment plan below   Prognosis 09/19/2018 Prognosis for Safe Diet Advancement Fair Barriers to Reach Goals Time post onset Barriers/Prognosis Comment COPD increases pna risk with aspiration CHL IP DIET RECOMMENDATION 09/19/2018 SLP Diet Recommendations Dysphagia 3 (Mech soft) solids;Thin liquid Liquid Administration via Cup;Straw Medication Administration Whole meds with puree Compensations Slow rate;Small sips/bites;Follow solids with liquid Postural Changes Remain semi-upright after after feeds/meals (Comment);Seated upright at 90 degrees   CHL IP OTHER RECOMMENDATIONS 09/19/2018 Recommended Consults Consider GI evaluation;Consider esophageal assessment Oral Care Recommendations Oral care QID Other Recommendations Have oral suction available   CHL IP FOLLOW UP RECOMMENDATIONS 09/19/2018 Follow up Recommendations Home health SLP;Skilled Nursing facility   [facility] IP FREQUENCY AND DURATION 09/19/2018 Speech Therapy Frequency (ACUTE ONLY) min 2x/week Treatment Duration 1 week      CHL IP ORAL PHASE 09/19/2018 Oral Phase WFL Oral - Pudding Teaspoon -- Oral - Pudding Cup -- Oral - Honey Teaspoon -- Oral - Honey Cup --  Oral - Nectar Teaspoon -- Oral - Nectar Cup -- Oral - Nectar Straw -- Oral - Thin Teaspoon -- Oral - Thin Cup -- Oral - Thin Straw -- Oral - Puree -- Oral - Mech Soft -- Oral - Regular -- Oral - Multi-Consistency -- Oral - Pill -- Oral Phase - Comment --  CHL IP PHARYNGEAL PHASE 09/19/2018 Pharyngeal Phase Impaired Pharyngeal- Pudding Teaspoon -- Pharyngeal -- Pharyngeal- Pudding Cup -- Pharyngeal -- Pharyngeal- Honey Teaspoon -- Pharyngeal -- Pharyngeal- Honey Cup -- Pharyngeal --  Pharyngeal- Nectar Teaspoon -- Pharyngeal -- Pharyngeal- Nectar Cup Reduced epiglottic inversion;Reduced anterior laryngeal mobility;Reduced laryngeal elevation;Reduced airway/laryngeal closure;Reduced tongue base retraction;Pharyngeal residue - valleculae;Pharyngeal residue - pyriform Pharyngeal -- Pharyngeal- Nectar Straw Reduced pharyngeal peristalsis;Reduced epiglottic inversion;Reduced anterior laryngeal mobility;Reduced laryngeal elevation;Reduced airway/laryngeal closure;Reduced tongue base retraction;Pharyngeal residue - valleculae;Pharyngeal residue - pyriform Pharyngeal -- Pharyngeal- Thin Teaspoon -- Pharyngeal -- Pharyngeal- Thin Cup Reduced epiglottic inversion;Reduced anterior laryngeal mobility;Reduced laryngeal elevation;Reduced airway/laryngeal closure;Reduced tongue base retraction;Pharyngeal residue - valleculae;Pharyngeal residue - pyriform Pharyngeal -- Pharyngeal- Thin Straw Reduced epiglottic inversion;Reduced anterior laryngeal mobility;Reduced laryngeal elevation;Reduced airway/laryngeal closure;Reduced tongue base retraction;Pharyngeal residue - valleculae;Pharyngeal residue - pyriform Pharyngeal -- Pharyngeal- Puree Reduced epiglottic inversion;Reduced anterior laryngeal mobility;Reduced laryngeal elevation;Reduced airway/laryngeal closure;Reduced tongue base retraction;Pharyngeal residue - valleculae Pharyngeal -- Pharyngeal- Mechanical Soft Reduced epiglottic inversion;Reduced anterior laryngeal  mobility;Reduced laryngeal elevation;Reduced airway/laryngeal closure;Reduced tongue base retraction;Pharyngeal residue - valleculae Pharyngeal -- Pharyngeal- Regular -- Pharyngeal -- Pharyngeal- Multi-consistency -- Pharyngeal -- Pharyngeal- Pill Reduced epiglottic inversion;Reduced anterior laryngeal mobility;Reduced laryngeal elevation;Reduced airway/laryngeal closure;Reduced tongue base retraction;Pharyngeal residue - valleculae Pharyngeal -- Pharyngeal Comment cues to dry swallow and cough/expectorate were helpful to decrease residuals  CHL IP CERVICAL ESOPHAGEAL PHASE 09/19/2018 Cervical Esophageal Phase Impaired Pudding Teaspoon -- Pudding Cup -- Honey Teaspoon -- Honey Cup -- Nectar Teaspoon -- Nectar Cup -- Nectar Straw -- Thin Teaspoon -- Thin Cup -- Thin Straw -- Puree -- Mechanical Soft -- Regular -- Multi-consistency -- Pill -- Cervical Esophageal Comment -- [name], MS [facility] SLP Acute Rehab Services Pager [phone] Office [phone] [name] 09/19/2018, 5:44 PM                 LOS: 1 day   Time spent: More than 50% of that time was spent in counseling and/or coordination of care.  Antonieta Pert, MD [facility]  09/20/2018, 9:02 AM

## 2018-09-20 NOTE — Progress Notes (Signed)
PHARMACY - PHYSICIAN COMMUNICATION CRITICAL VALUE ALERT - BLOOD CULTURE IDENTIFICATION (BCID)  [name] is an 85 y.o. male who presented to [facility] on 09/18/2018 with a chief complaint of ShOB  Assessment:  Probable aspiration PNA, suspected esophageal motility  Name of physician (or Provider) Contacted: Dr Antonieta Pert  Current antibiotics: Unasyn 1.5gm q6  Changes to prescribed antibiotics recommended:  Patient is on recommended antibiotics - No changes needed  Results for orders placed or performed during the hospital encounter of 05/26/18  Blood Culture ID Panel (Reflexed) (Collected: 05/26/2018  1:30 PM)  Result Value Ref Range   Enterococcus species NOT DETECTED NOT DETECTED   Listeria monocytogenes NOT DETECTED NOT DETECTED   Staphylococcus species DETECTED (A) NOT DETECTED   Staphylococcus aureus (BCID) NOT DETECTED NOT DETECTED   Methicillin resistance NOT DETECTED NOT DETECTED   Streptococcus species NOT DETECTED NOT DETECTED   Streptococcus agalactiae NOT DETECTED NOT DETECTED   Streptococcus pneumoniae NOT DETECTED NOT DETECTED   Streptococcus pyogenes NOT DETECTED NOT DETECTED   Acinetobacter baumannii NOT DETECTED NOT DETECTED   Enterobacteriaceae species NOT DETECTED NOT DETECTED   Enterobacter cloacae complex NOT DETECTED NOT DETECTED   Escherichia coli NOT DETECTED NOT DETECTED   Klebsiella oxytoca NOT DETECTED NOT DETECTED   Klebsiella pneumoniae NOT DETECTED NOT DETECTED   Proteus species NOT DETECTED NOT DETECTED   Serratia marcescens NOT DETECTED NOT DETECTED   Haemophilus influenzae NOT DETECTED NOT DETECTED   Neisseria meningitidis NOT DETECTED NOT DETECTED   Pseudomonas aeruginosa NOT DETECTED NOT DETECTED   Candida albicans NOT DETECTED NOT DETECTED   Candida glabrata NOT DETECTED NOT DETECTED   Candida krusei NOT DETECTED NOT DETECTED   Candida parapsilosis NOT DETECTED NOT DETECTED   Candida tropicalis NOT DETECTED NOT DETECTED    [name],  [name] 09/20/2018  8:58 AM

## 2018-09-20 NOTE — Evaluation (Signed)
Occupational Therapy Evaluation Patient Details Name: [name] MRN: [id number] DOB: 06-05-1931 Today's Date: 09/20/2018    History of Present Illness 85 y.o. male with medical history significant of ambulatory difficulties, previous history of stroke, COPD, suspected aspiration, hypertension and coronary artery disease presenting to the emergency room with sudden onset of difficulty walking and temperature of 101 at home. Dx of sepsis, PNA.   Clinical Impression   Pt was admitted for the above. At baseline, he is mod I for adls.  He lives with his son, daughter in law and children.  He does not have do to meal prep.  Pt is currently at supervision level, but sats are dropping on RA. He demonstrates pursed lip breathing. Will follow in acute setting with mod I level goals and hopefully back to RA. He only needed one liter of 02 during activity.    Follow Up Recommendations  Supervision - Intermittent    Equipment Recommendations  None recommended by OT    Recommendations for Other Services       Precautions / Restrictions Precautions Precautions: Fall Restrictions Weight Bearing Restrictions: No      Mobility Bed Mobility Overal bed mobility: Modified Independent             General bed mobility comments: HOB raised, extra time  Transfers Overall transfer level: Needs assistance Equipment used: Rolling walker (2 wheeled) Transfers: Sit to/from Stand Sit to Stand: Supervision         General transfer comment: pt steady; cues for UE placement    Balance                                           ADL either performed or assessed with clinical judgement   ADL Overall ADL's : Needs assistance/impaired Eating/Feeding: Independent   Grooming: Set up   Upper Body Bathing: Set up   Lower Body Bathing: Supervison/ safety   Upper Body Dressing : Set up   Lower Body Dressing: Supervision/safety   Toilet Transfer:  Supervision/safety;Ambulation;RW(chair)             General ADL Comments: pt does not use 02 at home.  Used 1 liter during activity. Pt demonstrates pursed lip breathing.  Ambulated in room with supervision. Pt tends to lift whole walker when making turns, but he remained steady     Vision         Perception     Praxis      Pertinent Vitals/Pain Pain Assessment: No/denies pain     Hand Dominance Right   Extremity/Trunk Assessment Upper Extremity Assessment Upper Extremity Assessment: Overall WFL for tasks assessed       Cervical / Trunk Assessment Cervical / Trunk Assessment: Kyphotic   Communication Communication Communication: No difficulties   Cognition Arousal/Alertness: Awake/alert Behavior During Therapy: WFL for tasks assessed/performed Overall Cognitive Status: Within Functional Limits for tasks assessed                                     General Comments  sats down to 85% on RA.  Kept sats in 90s on 1 liter    Exercises     Shoulder Instructions      Home Living Family/patient expects to be discharged to:: Private residence Living Arrangements: Children;Other relatives  Home Layout: Two level;Able to live on main level with bedroom/bathroom     Bathroom Shower/Tub: Walk-in shower   Bathroom Toilet: Handicapped height     Home Equipment: Environmental consultant - 2 wheels;Cane - single point;Walker - 4 wheels          Prior Functioning/Environment Level of Independence: Independent with assistive device(s)        Comments: has cane and RW        OT Problem List: Decreased strength;Decreased activity tolerance;Impaired balance (sitting and/or standing);Cardiopulmonary status limiting activity      OT Treatment/Interventions: Self-care/ADL training;Energy conservation;DME and/or AE instruction;Patient/family education;Balance training;Therapeutic activities    OT Goals(Current goals can be found in the care plan  section) Acute Rehab OT Goals Patient Stated Goal: none stated OT Goal Formulation: With patient Time For Goal Achievement: 10/04/18 Potential to Achieve Goals: Good ADL Goals Pt Will Transfer to Toilet: with modified independence;ambulating Additional ADL Goal #1: pt will complete adl including clothing retrieval at mod I level  OT Frequency: Min 2X/week   Barriers to D/C:            Co-evaluation              AM-PAC OT "6 Clicks" Daily Activity     Outcome Measure Help from another person eating meals?: None Help from another person taking care of personal grooming?: A Little Help from another person toileting, which includes using toliet, bedpan, or urinal?: A Little Help from another person bathing (including washing, rinsing, drying)?: A Little Help from another person to put on and taking off regular upper body clothing?: A Little Help from another person to put on and taking off regular lower body clothing?: A Little 6 Click Score: 19   End of Session    Activity Tolerance: Patient tolerated treatment well Patient left: in chair;with call bell/phone within reach;with chair alarm set  OT Visit Diagnosis: Muscle weakness (generalized) (M62.81)                Time: 4076-8088(PJ also in room for meds) OT Time Calculation (min): 28 min Charges:  OT General Charges $OT Visit: 1 Visit OT Evaluation $OT Eval Low Complexity: [place], OTR/L Acute Rehabilitation Services [phone] [facility] pager [phone] office 09/20/2018  [place] 09/20/2018, 10:05 AM

## 2018-09-21 ENCOUNTER — Other Ambulatory Visit: Payer: Self-pay

## 2018-09-21 ENCOUNTER — Ambulatory Visit: Payer: Medicare Other

## 2018-09-21 DIAGNOSIS — I10 Essential (primary) hypertension: Secondary | ICD-10-CM

## 2018-09-21 LAB — BASIC METABOLIC PANEL
Anion gap: 5 (ref 5–15)
BUN: 6 mg/dL — ABNORMAL LOW (ref 8–23)
CO2: 26 mmol/L (ref 22–32)
Calcium: 8.2 mg/dL — ABNORMAL LOW (ref 8.9–10.3)
Chloride: 108 mmol/L (ref 98–111)
Creatinine, Ser: 0.44 mg/dL — ABNORMAL LOW (ref 0.61–1.24)
GFR calc Af Amer: >60 mL/min (ref >60)
GFR calc non Af Amer: >60 mL/min (ref >60)
Glucose, Bld: 96 mg/dL (ref 70–99)
Potassium: 3.7 mmol/L (ref 3.5–5.1)
Sodium: 139 mmol/L (ref 135–145)

## 2018-09-21 LAB — CBC
HCT: 40.3 % (ref 39.0–52.0)
Hemoglobin: 12.7 g/dL — ABNORMAL LOW (ref 13.0–17.0)
MCH: 30.8 pg (ref 26.0–34.0)
MCHC: 31.5 g/dL (ref 30.0–36.0)
MCV: 97.8 fL (ref 80.0–100.0)
Platelets: 190 10*3/uL (ref 150–400)
RBC: 4.12 MIL/uL — ABNORMAL LOW (ref 4.22–5.81)
RDW: 14 % (ref 11.5–15.5)
WBC: 9.5 10*3/uL (ref 4.0–10.5)
nRBC: 0 % (ref 0.0–0.2)

## 2018-09-21 MED ORDER — AMOXICILLIN-POT CLAVULANATE 875-125 MG PO TABS: 1.0000 | ORAL_TABLET | Freq: Two times a day (BID) | ORAL | 0 refills | Status: DC

## 2018-09-21 NOTE — Discharge Summary (Signed)
Physician Discharge Summary  DANYL DEEMS GYI:[id number] DOB: Aug 25, 1931 DOA: 09/18/2018  PCP: [name], MD  Admit date: 09/18/2018 Discharge date: 09/21/2018  Admitted From: home Disposition:  home  Recommendations for Outpatient Follow-up:  1. Follow up with PCP in 1-2 weeks 2. Please obtain BMP/CBC in one week 3. Please follow up on the following pending results:  Home Health:YES PT  Equipment/Devices: none  Discharge Condition: Stable CODE STATUS: FULL Diet recommendation: Heart Healthy.  Brief/Interim Summary:   85 y.o.malewith medical history significant ofambulatory difficulties, previous history of stroke, COPD, suspected aspiration, hypertension and coronary artery disease presenting to the emergency room with sudden onset of difficulty walking and temperature of 101 at home. Patient is poor historian. According to him he does walk around the house with a cane. He had no issues until yesterday. He said he had swallowing test done 2 weeks ago and he is eating regular diet. Patient tells me that last night about 8 PM he just felt very weak and was unable to walk. They took temperature at home and it was 101. Patient does have chronic cough. Denies any sputum production. Denies any headache, nausea, vomiting. Denies any abdominal pain. Denies any chest pain. Denies any change in bowel or bladder habits. Lives at home with son, no other family members with COVID-19 infection at home. Does not use oxygen at baseline. ED Course:Noted hypotensive on arrival with blood pressure of 75/54, EMS also reported orthostatic. Temperature was 101.1. WBC 20.3. Lactic acid was 3. CT scan of the chest abdomen pelvis were done, negative for acute PE. Advance emphysematous changes, basal scarring and probable infiltrate. Treated with IV fluid boluses with sepsis protocol, cultures were sent before restarting antibiotics, patient was given vancomycin, cefepime and Flagyl in  the ER. Patient also noted to be hypoxic with 82% on room air improved to 100% with 2 L. Patient was treated with IV antibiotics, leukocytosis resolved.  Seen by speech and GI.  Had barium swallow eval found to have esophageal dysmotility no need for further endoscopy. Placed on dysphagia 3 diet by speech and will be discharged home with home health PT.  Cleared by PT.  Discharge Diagnoses:  Principal Problem:   Sepsis ([place]) Active Problems:   HTN (hypertension)   History of CVA (cerebrovascular accident)   Hyperlipidemia   Aspiration pneumonia (HCC)   Assessment & Plan:   Sepsis likely from aspiration pneumonia: Overall stable.  Leukocytosis resolved.  He will be discharged on oral Augmentin with special speech instruction for diet with dysphagia 3.  Blood culture + 1/4 with coagulase negative methicillin-resistant Staphylococcus suspecting contamination. ID pharmacy reviewed.we will continue Augmentin as above for aspiration pneumonia.    Esophageal dysmotility without evidence of esophageal tumor, but some distal esophageal narrowing which could either be a benign stricture or impaired relaxation of the lower esophageal sphincter (esophagogastric outflow obstruction), underwent barium swallow eval.  Seen by GI.  No need for further prophylactic endoscopy.  Advised diet as per speech with small bites of food and chin tuck maneuver.  Will follow up with gastroenterology on outpatient basis.  Previous oxyygen need: after last pneumonia:has been off o2 x3-4 wk at home  HTN:BP controlled on atenelol.  History of CVA- on asa, lipitor  Hyperlipidemia-on lipitpr  DVT prophylaxis: Lovenox Code Status:full Family Communication: plan of care discussed with patient in detail.I called patient's daughter [name] and updated. Disposition Plan: Home with home health PT.  Consultants:  GI  Procedures: None  Microbiology: Blood  cx with methicillin-resistant coagulase-negative  Staphylococcus likely contamination    Discharge Instructions  Discharge Instructions    Diet - low sodium heart healthy   Complete by: As directed    Discharge instructions   Complete by: As directed    Please call call MD or return to ER for similar or worsening recurring problem that brought you to hospital or if any fever,nausea/vomiting,abdominal pain, uncontrolled pain, chest pain,  shortness of breath or any other alarming symptoms.  Please follow-up your doctor as instructed in a week time and with Dr. Cristina Gong from GI and call the office for appointment.  Please avoid alcohol, smoking, or any other illicit substance and maintain healthy habits including taking your regular medications as prescribed.  You were cared for by a hospitalist during your hospital stay. If you have any questions about your discharge medications or the care you received while you were in the hospital after you are discharged, you can call the unit and ask to speak with the hospitalist on call if the hospitalist that took care of you is not available.  Once you are discharged, your primary care physician will handle any further medical issues. Please note that NO REFILLS for any discharge medications will be authorized once you are discharged, as it is imperative that you return to your primary care physician (or establish a relationship with a primary care physician if you do not have one) for your aftercare needs so that they can reassess your need for medications and monitor your lab values    SLP Diet Recommendations: Dysphagia 3 (Mech soft) solids;Thin liquid   Liquid Administration via: Cup;Straw   Medication Administration: Whole meds with puree(start and follow with liquids)   Supervision: Full assist for feeding;Full supervision/cueing for compensatory strategies   Compensations: Slow rate;Small sips/bites;Follow solids with liquid("hock" and expectorate during intake to clear pharynx)    Postural Changes: Remain semi-upright after after feeds/meals (Comment);Seated upright at 90 degrees   Increase activity slowly   Complete by: As directed      Allergies as of 09/21/2018   No Known Allergies     Medication List    TAKE these medications   acetaminophen 500 MG tablet Commonly known as: TYLENOL Take 500-1,000 mg by mouth every 8 (eight) hours as needed for mild pain or headache.   alendronate 70 MG tablet Commonly known as: FOSAMAX Take 1 tablet (70 mg total) by mouth every Friday. take with a full glass of water on an empty stomach   amoxicillin-clavulanate 875-125 MG tablet Commonly known as: Augmentin Take 1 tablet by mouth every 12 (twelve) hours for 5 days.   aspirin EC 81 MG tablet Take 81 mg by mouth daily.   atenolol 25 MG tablet Commonly known as: TENORMIN Take 1 tablet (25 mg total) by mouth daily.   atorvastatin 20 MG tablet Commonly known as: LIPITOR TAKE ONE TABLET BY MOUTH DAILY   calcium-vitamin D 500-200 MG-UNIT tablet Commonly known as: OSCAL WITH D Take 1 tablet by mouth 2 (two) times daily.   denosumab 60 MG/ML Sosy injection Commonly known as: PROLIA Inject 60 mg into the skin every 6 (six) months.   feeding supplement (ENSURE ENLIVE) Liqd Take 237 mLs by mouth 3 (three) times daily between meals. What changed: when to take this   Flovent HFA 110 MCG/ACT inhaler Generic drug: fluticasone USE 2 INHALATIONS TWICE A DAY What changed:   how much to take  how to take this  when to take this  additional instructions   ipratropium 17 MCG/ACT inhaler Commonly known as: ATROVENT HFA Inhale 2 puffs into the lungs every 6 (six) hours as needed for wheezing.   lidocaine 5 % Commonly known as: LIDODERM Place 1 patch onto the skin daily. Remove & Discard patch within 12 hours or as directed by MD   multivitamin with minerals Tabs tablet Take 1 tablet by mouth daily.   Systane 0.4-0.3 % Soln Generic drug: Polyethyl  Glycol-Propyl Glycol Place 1 drop into both eyes 3 (three) times daily as needed (dry eyes).   tamsulosin 0.4 MG Caps capsule Commonly known as: FLOMAX TAKE ONE CAPSULE BY MOUTH DAILY   traMADol 50 MG tablet Commonly known as: ULTRAM Take 50 mg by mouth every 6 (six) hours as needed for moderate pain.   Vitamin D-3 25 MCG (1000 UT) Caps Take 1,000 Units by mouth 2 (two) times a day.       No Known Allergies  Subjective: Afebrile overnight, no new complaints.  Resting well.   Procedures/Studies: Ct Angio Chest Pe W And/or Wo Contrast  Result Date: 09/19/2018 CLINICAL DATA:  Fever and shortness of breath EXAM: CT ANGIOGRAPHY CHEST WITH CONTRAST TECHNIQUE: Multidetector CT imaging of the chest was performed using the standard protocol during bolus administration of intravenous contrast. Multiplanar CT image reconstructions and MIPs were obtained to evaluate the vascular anatomy. CONTRAST:  167mL OMNIPAQUE IOHEXOL 350 MG/ML SOLN COMPARISON:  05/26/2018 FINDINGS: Cardiovascular: Normal heart size. No pericardial effusion. Extensive aortic and coronary calcification. Stenosis of great vessel ostia with limited quantification due to pulmonary artery timing. No evidence of pulmonary embolism with reasonable visualization to the segmental level. Mediastinum/Nodes: Negative for adenopathy or mass. Lungs/Pleura: Low volume chest. Centrilobular emphysema. Reticulation and airspace density in the lower lungs that was also seen previously, although increased. Pulmonary microlithiasis versus previously aspirated material. Negative for effusion or air leak Upper Abdomen: Reported separately Musculoskeletal: Remote T3, T4, T5, T6, T8, T9, T10, T11, and T12 compression fractures. Remote sternal body fracture. Remote rib fractures. Exaggerated kyphosis. No acute fracture. Review of the MIP images confirms the above findings. IMPRESSION: 1. Negative for pulmonary embolism. 2. Chronic interstitial lung disease  at the bases with apically emphysema and low volume lungs from kyphotic deformity. Cannot exclude superimposed pneumonia at the lung bases given density has increased from comparison. 3. There is microlithiasis versus aspirated material in the lower lobes, question chronic aspiration as cause of #2. Electronically Signed   By: Monte Fantasia M.D.   On: 09/19/2018 06:12   Ct Abdomen Pelvis W Contrast  Result Date: 09/19/2018 CLINICAL DATA:  Hypotension EXAM: CT ABDOMEN AND PELVIS WITH CONTRAST TECHNIQUE: Multidetector CT imaging of the abdomen and pelvis was performed using the standard protocol following bolus administration of intravenous contrast. CONTRAST:  119mL OMNIPAQUE IOHEXOL 350 MG/ML SOLN COMPARISON:  None. FINDINGS: Lower chest:  Reported on dedicated study Hepatobiliary: No focal liver abnormality.Cholelithiasis without evidence of cholecystitis. Pancreas: Unremarkable. Spleen: Unremarkable. Adrenals/Urinary Tract: Negative adrenals. No hydronephrosis or ureteral stone. Limited detection of renal calculi given there is already excretion of contrast following chest CTA. Left renal cystic density that is small. Distorted bladder from enlarged prostate. Small layering calcifications in the dependent bladder. Stomach/Bowel: No obstruction. Appendectomy. Negative for bowel inflammation. Vascular/Lymphatic: Diffuse atherosclerotic calcification. Visceral stenoses there likely high-grade, but limited by technique. No mass or adenopathy. Reproductive:Symmetric enlargement of prostate gland. Other: No ascites or pneumoperitoneum. Musculoskeletal: Prominent osteopenia with endplate fractures throughout the lumbar and lower thoracic spine. Sacral plasty  and multilevel vertebroplasty. No acute finding. IMPRESSION: 1. No acute finding. 2. Cholelithiasis. 3. Advanced atherosclerosis with visceral stenoses. Electronically Signed   By: Monte Fantasia M.D.   On: 09/19/2018 06:17   Dg Chest Port 1 View  Result  Date: 09/19/2018 CLINICAL DATA:  85 year old male with weakness. EXAM: PORTABLE CHEST 1 VIEW COMPARISON:  Chest radiograph dated 07/05/2018 FINDINGS: Evaluation is limited due to positioning of the patient and superimposition of the mandible over the upper lung. Shallow inspiration with bibasilar atelectasis. Diffuse chronic interstitial coarsening. No new consolidative change. There is no pleural effusion or pneumothorax. Stable cardiac silhouette. No acute osseous pathology. Osteopenia with degenerative changes of the spine and vertebroplasty changes. IMPRESSION: No active disease. Electronically Signed   By: Anner Crete M.D.   On: 09/19/2018 00:41   Dg Swallowing Func-speech Pathology  Result Date: 09/19/2018 Objective Swallowing Evaluation: Type of Study: MBS-Modified Barium Swallow Study  Patient Details Name: [name] MRN: [id number] Date of Birth: December 22, 1931 Today's Date: 09/19/2018 Time: SLP Start Time (ACUTE ONLY): 4098 -SLP Stop Time (ACUTE ONLY): 1500 SLP Time Calculation (min) (ACUTE ONLY): 38 min Past Medical History: Past Medical History: Diagnosis Date . Arthritis   hands  . BPH (benign prostatic hyperplasia)  . COPD (chronic obstructive pulmonary disease) ([place])  . Coronary artery disease  . CVA (cerebral infarction) 3 years ago  mild stroke . HLD (hyperlipidemia)  . Hypertension  . Osteoporosis  . Pneumonia  . 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 SCC (squamous cell carcinoma) in situ 12/12/2017  left cheek inf. . 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 BACK SURGERY   . LAPAROSCOPIC APPENDECTOMY N/A 11/02/2013  Procedure: APPENDECTOMY LAPAROSCOPIC;  Surgeon: Donnie Mesa, MD;  Location: [facility] OR;  Service: General;  Laterality: N/A; HPI: 85 y.o. male with medical history significant of ambulatory difficulties, previous history of stroke, COPD, suspected  aspiration, hypertension and coronary artery disease presenting to the emergency room with sudden onset of difficulty walking and temperature of 101 at home.  Patient is poor historian.  According to him he does walk around the house with a cane.  He had no issues until yesterday.  He said he had swallowing test done that showed mostly obstructive pharyngeal dysphagia with kyphosis impairing epiglottic deflection.    Aspiraiton of thin and wet voice at baseline - now presenting with pna again and deconditioned recommend repeat MBS.  Also note pt is a full code and has recurrent pna thus recommend consider pallative care referral to establish [place].  Pt CT chest shows concerns for chronic aspiration.   Subjective: pt presents for MBS Assessment / Plan / Recommendation CHL IP CLINICAL IMPRESSIONS 09/19/2018 Clinical Impression Patient presents with mostly obstructive pharyngeal dysphagia due to his kyphosis impairing epiglottic deflection and thus allowing significant vallecular residuals across all consistencies.  He also demonstrates decreased laryngeal closure which contributes to mild residuals in pyriform sinuses.  Aspiration was NOT observed during testing despite taxing pt to sequential swallows of purees and liquids.  He is however able to decrease pharyngeal residuals with dry swallows and clear remainder with "hock" and expectoration.  Minimal pyriform sinus residuals of liquids noted. He was tested with thin, nectar, pudding, cracker and barium tablet with puree.  Suspect contribution of esophageal dysphagia given barium tablet provided with pudding appeared to lodge in proximal mid-esophagus WITHOUT pt sensation.  Pt also appeared with slow clearance of liquids, extra bolus of pudding facilitated transiting into stomach from esophagus.  Esophagus appears dilated with one area of decreased opening consistent with possible dysmotility.  Recommend consider dedicated esophageal evaluation *using caution with  pharyngeal dysphagia* to assess esophageal function and its possible role in pt's chronic aspiration.  His kyphosis impairs his pharyngeal and esophageal clearance.   SLP Visit Diagnosis Dysphagia, pharyngeal phase (R13.13) Attention and concentration deficit following -- Frontal lobe and executive function deficit following -- Impact on safety and function Moderate aspiration risk   CHL IP TREATMENT RECOMMENDATION 09/19/2018 Treatment Recommendations Therapy as outlined in treatment plan below   Prognosis 09/19/2018 Prognosis for Safe Diet Advancement Fair Barriers to Reach Goals Time post onset Barriers/Prognosis Comment COPD increases pna risk with aspiration CHL IP DIET RECOMMENDATION 09/19/2018 SLP Diet Recommendations Dysphagia 3 (Mech soft) solids;Thin liquid Liquid Administration via Cup;Straw Medication Administration Whole meds with puree Compensations Slow rate;Small sips/bites;Follow solids with liquid Postural Changes Remain semi-upright after after feeds/meals (Comment);Seated upright at 90 degrees   CHL IP OTHER RECOMMENDATIONS 09/19/2018 Recommended Consults Consider GI evaluation;Consider esophageal assessment Oral Care Recommendations Oral care QID Other Recommendations Have oral suction available   CHL IP FOLLOW UP RECOMMENDATIONS 09/19/2018 Follow up Recommendations Home health SLP;Skilled Nursing facility   [facility] IP FREQUENCY AND DURATION 09/19/2018 Speech Therapy Frequency (ACUTE ONLY) min 2x/week Treatment Duration 1 week      CHL IP ORAL PHASE 09/19/2018 Oral Phase WFL Oral - Pudding Teaspoon -- Oral - Pudding Cup -- Oral - Honey Teaspoon -- Oral - Honey Cup -- Oral - Nectar Teaspoon -- Oral - Nectar Cup -- Oral - Nectar Straw -- Oral - Thin Teaspoon -- Oral - Thin Cup -- Oral - Thin Straw -- Oral - Puree -- Oral - Mech Soft -- Oral - Regular -- Oral - Multi-Consistency -- Oral - Pill -- Oral Phase - Comment --  CHL IP PHARYNGEAL PHASE 09/19/2018 Pharyngeal Phase Impaired Pharyngeal- Pudding Teaspoon  -- Pharyngeal -- Pharyngeal- Pudding Cup -- Pharyngeal -- Pharyngeal- Honey Teaspoon -- Pharyngeal -- Pharyngeal- Honey Cup -- Pharyngeal -- Pharyngeal- Nectar Teaspoon -- Pharyngeal -- Pharyngeal- Nectar Cup Reduced epiglottic inversion;Reduced anterior laryngeal mobility;Reduced laryngeal elevation;Reduced airway/laryngeal closure;Reduced tongue base retraction;Pharyngeal residue - valleculae;Pharyngeal residue - pyriform Pharyngeal -- Pharyngeal- Nectar Straw Reduced pharyngeal peristalsis;Reduced epiglottic inversion;Reduced anterior laryngeal mobility;Reduced laryngeal elevation;Reduced airway/laryngeal closure;Reduced tongue base retraction;Pharyngeal residue - valleculae;Pharyngeal residue - pyriform Pharyngeal -- Pharyngeal- Thin Teaspoon -- Pharyngeal -- Pharyngeal- Thin Cup Reduced epiglottic inversion;Reduced anterior laryngeal mobility;Reduced laryngeal elevation;Reduced airway/laryngeal closure;Reduced tongue base retraction;Pharyngeal residue - valleculae;Pharyngeal residue - pyriform Pharyngeal -- Pharyngeal- Thin Straw Reduced epiglottic inversion;Reduced anterior laryngeal mobility;Reduced laryngeal elevation;Reduced airway/laryngeal closure;Reduced tongue base retraction;Pharyngeal residue - valleculae;Pharyngeal residue - pyriform Pharyngeal -- Pharyngeal- Puree Reduced epiglottic inversion;Reduced anterior laryngeal mobility;Reduced laryngeal elevation;Reduced airway/laryngeal closure;Reduced tongue base retraction;Pharyngeal residue - valleculae Pharyngeal -- Pharyngeal- Mechanical Soft Reduced epiglottic inversion;Reduced anterior laryngeal mobility;Reduced laryngeal elevation;Reduced airway/laryngeal closure;Reduced tongue base retraction;Pharyngeal residue - valleculae Pharyngeal -- Pharyngeal- Regular -- Pharyngeal -- Pharyngeal- Multi-consistency -- Pharyngeal -- Pharyngeal- Pill Reduced epiglottic inversion;Reduced anterior laryngeal mobility;Reduced laryngeal elevation;Reduced  airway/laryngeal closure;Reduced tongue base retraction;Pharyngeal residue - valleculae Pharyngeal -- Pharyngeal Comment cues to dry swallow and cough/expectorate were helpful to decrease residuals  CHL IP CERVICAL ESOPHAGEAL PHASE 09/19/2018 Cervical Esophageal Phase Impaired Pudding Teaspoon -- Pudding Cup -- Honey Teaspoon -- Honey Cup -- Nectar Teaspoon -- Nectar Cup -- Nectar Straw -- Thin Teaspoon -- Thin Cup -- Thin Straw --  Puree -- Mechanical Soft -- Regular -- Multi-consistency -- Pill -- Cervical Esophageal Comment -- [name], MS [facility] SLP Acute Rehab Services Pager [phone] Office [phone] [name] 09/19/2018, 5:44 PM              Dg Esophagus W Single Cm (sol Or Thin Ba)  Result Date: 09/20/2018 CLINICAL DATA:  Pneumonia.  Dysphagia. EXAM: ESOPHOGRAM/BARIUM SWALLOW TECHNIQUE: Single contrast examination was performed using  thin barium. FLUOROSCOPY TIME:  Fluoroscopy Time:  1 minutes, 48 seconds Radiation Exposure Index (if provided by the fluoroscopic device): 16.7 mGy Number of Acquired Spot Images: 0 COMPARISON:  Chest CT from 09/19/2018 FINDINGS: Thoracic kyphosis is present. Due to the patient's general frailty, the exam was performed with patient in the prone positions (mostly LPO and RPO). Corkscrew esophagus noted. Secondary contractions effected transit from the esophagus into the stomach, with proximal escape and loss of primary peristaltic waves in the mid esophagus. I was only able to distend the gastroesophageal junction up to about 7 mm, for example on image 22/6, but there is no definite irregularity in this vicinity. The 13 mm barium tail pill passed briskly to the distal esophagus. The pill did not proceed further despite there not being an obvious stricture at the location where the pill stopped. IMPRESSION: 1. Corkscrew esophagus, with esophageal dysmotility and secondary contractions affecting most of the transit from the esophagus to the stomach. 2. Maximum  distention at the gastroesophageal junction was about 7 mm but was not irregular. It may be that the GE junction failed to dilate further simply due to dysmotility, but I cannot exclude a smooth stricture. 3. The 13 mm barium pill stopped in the distal esophagus, several cm proximal to the GE junction. Although there is no stricture in the vicinity of the pill, the pill would not proceed further despite multiple swallows. Electronically Signed   By: Van Clines M.D.   On: 09/20/2018 16:24   Discharge Exam: Vitals:   09/21/18 0520 09/21/18 0713  BP: 135/86   Pulse: 75   Resp: 18   Temp: 98 F (36.7 C)   SpO2:  98%   Vitals:   09/20/18 2116 09/21/18 0520 09/21/18 0606 09/21/18 0713  BP: (!) 160/75 135/86    Pulse: (!) 59 75    Resp: 18 18    Temp: 98.4 F (36.9 C) 98 F (36.7 C)    TempSrc: Oral Oral    SpO2: 94%   98%  Weight:   62.8 kg   Height:        General: Pt is alert, awake, not in acute distress Cardiovascular: RRR, S1/S2 +, no rubs, no gallops Respiratory: CTA bilaterally, no wheezing, no rhonchi Abdominal: Soft, NT, ND, bowel sounds + Extremities: no edema, no cyanosis   The results of significant diagnostics from this hospitalization (including imaging, microbiology, ancillary and laboratory) are listed below for reference.     Microbiology: Recent Results (from the past 240 hour(s))  Blood Culture (routine x 2)     Status: None (Preliminary result)   Collection Time: 09/18/18 11:55 PM   Specimen: BLOOD LEFT ARM  Result Value Ref Range Status   Specimen Description   Final    BLOOD LEFT ARM Performed at [facility], [address], [place], [place] [address]    Special Requests   Final    BOTTLES DRAWN AEROBIC AND ANAEROBIC Blood Culture adequate volume Performed at [facility] [address] [place], [place]  [address]    Culture   Final    NO GROWTH 2 DAYS Performed at [facility], [place] [address],  [place], [place] [address]    Report Status PENDING  Incomplete  Blood Culture (routine x 2)     Status: None (Preliminary result)   Collection Time: 09/18/18 11:55 PM   Specimen: BLOOD RIGHT ARM  Result Value Ref Range Status   Specimen Description   Final    BLOOD RIGHT ARM Performed at [facility], [place] [address] [place], [place] [address]    Special Requests   Final    BOTTLES DRAWN AEROBIC AND ANAEROBIC Blood Culture adequate volume Performed at [facility] [address] [place], [place] [address]    Culture  Setup Time   Final    IN BOTH AEROBIC AND ANAEROBIC BOTTLES GRAM POSITIVE COCCI Organism ID to follow CRITICAL RESULT CALLED TO, READ BACK BY AND VERIFIED WITH: PHARMD T GREEN [id number] MLM Performed at [facility], [address] [place], [place] [place] [id number]    Culture GRAM POSITIVE COCCI  Final   Report Status PENDING  Incomplete  Urine culture     Status: None   Collection Time: 09/18/18 11:55 PM   Specimen: In/Out Cath Urine  Result Value Ref Range Status   Specimen Description   Final    IN/OUT CATH URINE Performed at [facility], [place] [address], [place], [place] [address]    Special Requests   Final    NONE Performed at [facility], [place] [address], [place], [place] [address]    Culture   Final    NO GROWTH Performed at [facility], [place] [address], [place], [place] [address]    Report Status 09/20/2018 FINAL  Final  SARS Coronavirus 2 (CEPHEID- Performed in [place] hospital lab), Hosp Order     Status: None   Collection Time: 09/18/18 11:55 PM   Specimen: Nasopharyngeal Swab  Result Value Ref Range Status   SARS Coronavirus 2 NEGATIVE NEGATIVE Final    Comment: (NOTE) If result is NEGATIVE SARS-CoV-2 target nucleic acids are NOT DETECTED. The SARS-CoV-2 RNA is generally detectable in upper and lower  respiratory specimens during the acute phase of infection. The lowest   concentration of SARS-CoV-2 viral copies this assay can detect is 250  copies / mL. A negative result does not preclude SARS-CoV-2 infection  and should not be used as the sole basis for treatment or other  patient management decisions.  A negative result may occur with  improper specimen collection / handling, submission of specimen other  than nasopharyngeal swab, presence of viral mutation(s) within the  areas targeted by this assay, and inadequate number of viral copies  (<250 copies / mL). A negative result must be combined with clinical  observations, patient history, and epidemiological information. If result is POSITIVE SARS-CoV-2 target nucleic acids are DETECTED. The SARS-CoV-2 RNA is generally detectable in upper and lower  respiratory specimens dur ing the acute phase of infection.  Positive  results are indicative of active infection with SARS-CoV-2.  Clinical  correlation with patient history and other diagnostic information is  necessary to determine patient infection status.  Positive results do  not rule out bacterial infection or co-infection with other viruses. If result is PRESUMPTIVE POSTIVE SARS-CoV-2 nucleic acids MAY BE PRESENT.   A presumptive positive result was obtained on the submitted specimen  and confirmed on repeat  testing.  While 2019 novel coronavirus  (SARS-CoV-2) nucleic acids may be present in the submitted sample  additional confirmatory testing may be necessary for epidemiological  and / or clinical management purposes  to differentiate between  SARS-CoV-2 and other Sarbecovirus currently known to infect humans.  If clinically indicated additional testing with an alternate test  methodology [phone]) is advised. The SARS-CoV-2 RNA is generally  detectable in upper and lower respiratory sp ecimens during the acute  phase of infection. The expected result is Negative. Fact Sheet for Patients:  StrictlyIdeas.no Fact Sheet  for Healthcare Providers: BankingDealers.co.za This test is not yet approved or cleared by the [place] FDA and has been authorized for detection and/or diagnosis of SARS-CoV-2 by FDA under an Emergency Use Authorization (EUA).  This EUA will remain in effect (meaning this test can be used) for the duration of the COVID-19 declaration under Section 564(b)(1) of the Act, 21 U.S.C. section 360bbb-3(b)(1), unless the authorization is terminated or revoked sooner. Performed at [facility], [place] [address] [place], [place] [address]   Blood Culture ID Panel (Reflexed)     Status: Abnormal   Collection Time: 09/18/18 11:55 PM  Result Value Ref Range Status   Enterococcus species NOT DETECTED NOT DETECTED Final   Listeria monocytogenes NOT DETECTED NOT DETECTED Final   Staphylococcus species DETECTED (A) NOT DETECTED Final    Comment: Methicillin (oxacillin) resistant coagulase negative staphylococcus. Possible blood culture contaminant (unless isolated from more than one blood culture draw or clinical case suggests pathogenicity). No antibiotic treatment is indicated for blood  culture contaminants. CRITICAL RESULT CALLED TO, READ BACK BY AND VERIFIED WITH: PHARMD T GREEN [id number] MLM    Staphylococcus aureus (BCID) NOT DETECTED NOT DETECTED Final   Methicillin resistance DETECTED (A) NOT DETECTED Final    Comment: CRITICAL RESULT CALLED TO, READ BACK BY AND VERIFIED WITH: PHARMD T GREEN [id number] 0840 MLM    Streptococcus species NOT DETECTED NOT DETECTED Final   Streptococcus agalactiae NOT DETECTED NOT DETECTED Final   Streptococcus pneumoniae NOT DETECTED NOT DETECTED Final   Streptococcus pyogenes NOT DETECTED NOT DETECTED Final   Acinetobacter baumannii NOT DETECTED NOT DETECTED Final   Enterobacteriaceae species NOT DETECTED NOT DETECTED Final   Enterobacter cloacae complex NOT DETECTED NOT DETECTED Final   Escherichia coli NOT  DETECTED NOT DETECTED Final   Klebsiella oxytoca NOT DETECTED NOT DETECTED Final   Klebsiella pneumoniae NOT DETECTED NOT DETECTED Final   Proteus species NOT DETECTED NOT DETECTED Final   Serratia marcescens NOT DETECTED NOT DETECTED Final   Haemophilus influenzae NOT DETECTED NOT DETECTED Final   Neisseria meningitidis NOT DETECTED NOT DETECTED Final   Pseudomonas aeruginosa NOT DETECTED NOT DETECTED Final   Candida albicans NOT DETECTED NOT DETECTED Final   Candida glabrata NOT DETECTED NOT DETECTED Final   Candida krusei NOT DETECTED NOT DETECTED Final   Candida parapsilosis NOT DETECTED NOT DETECTED Final   Candida tropicalis NOT DETECTED NOT DETECTED Final    Comment: Performed at [facility], [place]. [address] [place], [place] [address]     Labs: BNP (last 3 results) Recent Labs    05/26/18 1015 09/18/18 2355  BNP 27.5 56.3   Basic Metabolic Panel: Recent Labs  Lab 09/18/18 2355 09/20/18 0533 09/21/18 0503  NA 138 139 139  K 3.9 3.6 3.7  CL 106 110 108  CO2 22 23 26   GLUCOSE 200* 83 96  BUN 16 8 6*  CREATININE 0.75  0.59* 0.44*  CALCIUM 8.7* 8.0* 8.2*   Liver Function Tests: Recent Labs  Lab 09/18/18 2355  AST 23  ALT 24  ALKPHOS 64  BILITOT 0.8  PROT 5.7*  ALBUMIN 3.3*   No results for input(s): LIPASE, AMYLASE in the last 168 hours. No results for input(s): AMMONIA in the last 168 hours. CBC: Recent Labs  Lab 09/18/18 2355 09/20/18 0533 09/21/18 0503  WBC 20.3* 10.3 9.5  NEUTROABS 18.3*  --   --   HGB 15.0 12.8* 12.7*  HCT 47.7 40.8 40.3  MCV 96.4 99.5 97.8  PLT 221 182 190   Cardiac Enzymes: No results for input(s): CKTOTAL, CKMB, CKMBINDEX, TROPONINI in the last 168 hours. BNP: Invalid input(s): POCBNP CBG: No results for input(s): GLUCAP in the last 168 hours. D-Dimer No results for input(s): DDIMER in the last 72 hours. Hgb A1c No results for input(s): HGBA1C in the last 72 hours. Lipid Profile No results for input(s): CHOL,  HDL, LDLCALC, TRIG, CHOLHDL, LDLDIRECT in the last 72 hours. Thyroid function studies No results for input(s): TSH, T4TOTAL, T3FREE, THYROIDAB in the last 72 hours.  Invalid input(s): FREET3 Anemia work up No results for input(s): VITAMINB12, FOLATE, FERRITIN, TIBC, IRON, RETICCTPCT in the last 72 hours. Urinalysis    Component Value Date/Time   COLORURINE YELLOW 09/18/2018 2355   APPEARANCEUR CLEAR 09/18/2018 2355   LABSPEC 1.016 09/18/2018 2355   PHURINE 5.0 09/18/2018 2355   GLUCOSEU NEGATIVE 09/18/2018 2355   HGBUR NEGATIVE 09/18/2018 2355   BILIRUBINUR NEGATIVE 09/18/2018 2355   BILIRUBINUR negative 11/22/2013 0945   KETONESUR NEGATIVE 09/18/2018 2355   PROTEINUR NEGATIVE 09/18/2018 2355   UROBILINOGEN 0.2 11/22/2013 0945   UROBILINOGEN 1.0 11/01/2013 1954   NITRITE NEGATIVE 09/18/2018 2355   LEUKOCYTESUR NEGATIVE 09/18/2018 2355   Sepsis Labs Invalid input(s): PROCALCITONIN,  WBC,  LACTICIDVEN Microbiology Recent Results (from the past 240 hour(s))  Blood Culture (routine x 2)     Status: None (Preliminary result)   Collection Time: 09/18/18 11:55 PM   Specimen: BLOOD LEFT ARM  Result Value Ref Range Status   Specimen Description   Final    BLOOD LEFT ARM Performed at [facility], [place] [address], [place], [place] [address]    Special Requests   Final    BOTTLES DRAWN AEROBIC AND ANAEROBIC Blood Culture adequate volume Performed at [place] [address] [place], [place] [address]    Culture   Final    NO GROWTH 2 DAYS Performed at [place] [address], [place], [place] [address]    Report Status PENDING  Incomplete  Blood Culture (routine x 2)     Status: None (Preliminary result)   Collection Time: 09/18/18 11:55 PM   Specimen: BLOOD RIGHT ARM  Result Value Ref Range Status   Specimen Description   Final    BLOOD RIGHT ARM Performed at [facility], [place] [address], [place], [place] [address]    Special  Requests   Final    BOTTLES DRAWN AEROBIC AND ANAEROBIC Blood Culture adequate volume Performed at [place] [address] [place], [place] [address]    Culture  Setup Time   Final    IN BOTH AEROBIC AND ANAEROBIC BOTTLES GRAM POSITIVE COCCI Organism ID to follow CRITICAL RESULT CALLED TO, READ BACK BY AND VERIFIED WITH: PHARMD T GREEN [id number] MLM Performed at [facility], [address], [place], [place] [address]  Culture GRAM POSITIVE COCCI  Final   Report Status PENDING  Incomplete  Urine culture     Status: None   Collection Time: 09/18/18 11:55 PM   Specimen: In/Out Cath Urine  Result Value Ref Range Status   Specimen Description   Final    IN/OUT CATH URINE Performed at [facility], [place] [address] [place], [place] [address]    Special Requests   Final    NONE Performed at [facility], [place] [address] [place], [place] [address]    Culture   Final    NO GROWTH Performed at [facility], [place] [address] [place], [place] [address]    Report Status 09/20/2018 FINAL  Final  SARS Coronavirus 2 (CEPHEID- Performed in [place] hospital lab), Hosp Order     Status: None   Collection Time: 09/18/18 11:55 PM   Specimen: Nasopharyngeal Swab  Result Value Ref Range Status   SARS Coronavirus 2 NEGATIVE NEGATIVE Final    Comment: (NOTE) If result is NEGATIVE SARS-CoV-2 target nucleic acids are NOT DETECTED. The SARS-CoV-2 RNA is generally detectable in upper and lower  respiratory specimens during the acute phase of infection. The lowest  concentration of SARS-CoV-2 viral copies this assay can detect is 250  copies / mL. A negative result does not preclude SARS-CoV-2 infection  and should not be used as the sole basis for treatment or other  patient management decisions.  A negative result may occur with  improper specimen collection / handling, submission of specimen other  than nasopharyngeal  swab, presence of viral mutation(s) within the  areas targeted by this assay, and inadequate number of viral copies  (<250 copies / mL). A negative result must be combined with clinical  observations, patient history, and epidemiological information. If result is POSITIVE SARS-CoV-2 target nucleic acids are DETECTED. The SARS-CoV-2 RNA is generally detectable in upper and lower  respiratory specimens dur ing the acute phase of infection.  Positive  results are indicative of active infection with SARS-CoV-2.  Clinical  correlation with patient history and other diagnostic information is  necessary to determine patient infection status.  Positive results do  not rule out bacterial infection or co-infection with other viruses. If result is PRESUMPTIVE POSTIVE SARS-CoV-2 nucleic acids MAY BE PRESENT.   A presumptive positive result was obtained on the submitted specimen  and confirmed on repeat testing.  While 2019 novel coronavirus  (SARS-CoV-2) nucleic acids may be present in the submitted sample  additional confirmatory testing may be necessary for epidemiological  and / or clinical management purposes  to differentiate between  SARS-CoV-2 and other Sarbecovirus currently known to infect humans.  If clinically indicated additional testing with an alternate test  methodology [phone]) is advised. The SARS-CoV-2 RNA is generally  detectable in upper and lower respiratory sp ecimens during the acute  phase of infection. The expected result is Negative. Fact Sheet for Patients:  StrictlyIdeas.no Fact Sheet for Healthcare Providers: BankingDealers.co.za This test is not yet approved or cleared by the [place] FDA and has been authorized for detection and/or diagnosis of SARS-CoV-2 by FDA under an Emergency Use Authorization (EUA).  This EUA will remain in effect (meaning this test can be used) for the duration of the COVID-19 declaration  under Section 564(b)(1) of the Act, 21 U.S.C. section 360bbb-3(b)(1), unless the authorization is terminated or revoked sooner. Performed at [facility], [place] [address] [place], [place] [address]   Blood Culture  ID Panel (Reflexed)     Status: Abnormal   Collection Time: 09/18/18 11:55 PM  Result Value Ref Range Status   Enterococcus species NOT DETECTED NOT DETECTED Final   Listeria monocytogenes NOT DETECTED NOT DETECTED Final   Staphylococcus species DETECTED (A) NOT DETECTED Final    Comment: Methicillin (oxacillin) resistant coagulase negative staphylococcus. Possible blood culture contaminant (unless isolated from more than one blood culture draw or clinical case suggests pathogenicity). No antibiotic treatment is indicated for blood  culture contaminants. CRITICAL RESULT CALLED TO, READ BACK BY AND VERIFIED WITH: PHARMD T GREEN [id number] 0840 MLM    Staphylococcus aureus (BCID) NOT DETECTED NOT DETECTED Final   Methicillin resistance DETECTED (A) NOT DETECTED Final    Comment: CRITICAL RESULT CALLED TO, READ BACK BY AND VERIFIED WITH: PHARMD T GREEN [id number] 0840 MLM    Streptococcus species NOT DETECTED NOT DETECTED Final   Streptococcus agalactiae NOT DETECTED NOT DETECTED Final   Streptococcus pneumoniae NOT DETECTED NOT DETECTED Final   Streptococcus pyogenes NOT DETECTED NOT DETECTED Final   Acinetobacter baumannii NOT DETECTED NOT DETECTED Final   Enterobacteriaceae species NOT DETECTED NOT DETECTED Final   Enterobacter cloacae complex NOT DETECTED NOT DETECTED Final   Escherichia coli NOT DETECTED NOT DETECTED Final   Klebsiella oxytoca NOT DETECTED NOT DETECTED Final   Klebsiella pneumoniae NOT DETECTED NOT DETECTED Final   Proteus species NOT DETECTED NOT DETECTED Final   Serratia marcescens NOT DETECTED NOT DETECTED Final   Haemophilus influenzae NOT DETECTED NOT DETECTED Final   Neisseria meningitidis NOT DETECTED NOT DETECTED Final   Pseudomonas  aeruginosa NOT DETECTED NOT DETECTED Final   Candida albicans NOT DETECTED NOT DETECTED Final   Candida glabrata NOT DETECTED NOT DETECTED Final   Candida krusei NOT DETECTED NOT DETECTED Final   Candida parapsilosis NOT DETECTED NOT DETECTED Final   Candida tropicalis NOT DETECTED NOT DETECTED Final    Comment: Performed at [facility], [place]. [address] [place], [place] [address]     Time coordinating discharge: 35 minutes  SIGNED:   Antonieta Pert, MD  [facility] 09/21/2018, 12:04 PM  If 7PM-7AM, please contact night-coverage [email or web address]

## 2018-09-21 NOTE — Progress Notes (Signed)
Pt d/c to home. Pt and RN reviewed discharge paperwork and pt verified understanding with teachback. Pt PIV removed with catheter intact and no complications. Telemetry d/c'ed and box placed in dirty bin at nurses's station. Pt escorted off of unit with mask on by PCT to care and car of son in main entrance.

## 2018-09-21 NOTE — Progress Notes (Signed)
Pt's BaS results reviewed w/ pt and films reviewed.  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  I again, carefully and thoroughly, interrogated the patient about dysphagia symptoms.  He does have to eat small bites of food, but it sounds like the only symptoms he has are related to the pharynx, not the esophageal body.    On careful questioning, he denies issues with regurgitation, food or pills getting caught in the esophageal region, or having to stop eating because of a sense of his esophagus getting "plugged up."  As far as I can tell, the radiographically demonstrated abnormalities of the esophagus are asymptomatic.  As such, and especially taking into account the patient's age, respiratory dysfunction, and overall frailty, I do not think that empiric endoscopic intervention such as dilatation, Botox, or even diagnostic endoscopy would be prudent at the present time.  I did give the patient my card and recommended that he contact me if, at some future point, he does develop esophageal dysphagia symptoms.  For his pharyngeal dysphagia symptoms, I again recommended chin tuck maneuver, and small bites of food.  I will sign off, but would be happy to discuss his case with you in more detail if desired.  [name], M.D. Pager [phone] If no answer or after 5 PM call [phone]

## 2018-09-21 NOTE — Care Management Important Message (Signed)
Important Message  Patient Details IM Letter given to Dessa Phi RN to present to the Patient Name: [name] MRN: [id number] Date of Birth: 14-Aug-1931   Medicare Important Message Given:  Yes     Kerin Salen 09/21/2018, 10:22 AM

## 2018-09-21 NOTE — TOC Transition Note (Signed)
Transition of Care [facility]) - CM/SW Discharge Note   Patient Details  Name: [name] MRN: [id number] Date of Birth: [birth year]  Transition [facility] CM/SW Contact:  Dessa Phi, RN Phone Number: 09/21/2018, 1:36 PM   Clinical Narrative:   [facility] chosen for HHC-HHPT/OT-rep [place] Glad aware of d/c. No further CM needs.    Final next level of care: [place] Barriers to Discharge: No Barriers Identified   Patient Goals and CMS Choice Patient states their goals for this hospitalization and ongoing recovery are:: go home CMS Medicare.gov Compare Post Acute Care list provided to:: Patient Choice offered to / list presented to : Patient  Discharge Placement                       Discharge Plan and Services                          HH Arranged: PT, OT [facility] Agency: [place] [facility] Date [facility] Agency Contacted: 09/21/18 Time HH Agency Contacted: [phone]    Social Determinants of Health (SDOH) Interventions     Readmission Risk Interventions No flowsheet data found.

## 2018-09-22 DIAGNOSIS — M19041 Primary osteoarthritis, right hand: Secondary | ICD-10-CM | POA: Diagnosis not present

## 2018-09-22 DIAGNOSIS — J690 Pneumonitis due to inhalation of food and vomit: Secondary | ICD-10-CM | POA: Diagnosis not present

## 2018-09-22 DIAGNOSIS — J449 Chronic obstructive pulmonary disease, unspecified: Secondary | ICD-10-CM | POA: Diagnosis not present

## 2018-09-22 DIAGNOSIS — Z7982 Long term (current) use of aspirin: Secondary | ICD-10-CM | POA: Diagnosis not present

## 2018-09-22 DIAGNOSIS — R1310 Dysphagia, unspecified: Secondary | ICD-10-CM | POA: Diagnosis not present

## 2018-09-22 DIAGNOSIS — R2689 Other abnormalities of gait and mobility: Secondary | ICD-10-CM | POA: Diagnosis not present

## 2018-09-22 DIAGNOSIS — I10 Essential (primary) hypertension: Secondary | ICD-10-CM | POA: Diagnosis not present

## 2018-09-22 DIAGNOSIS — E785 Hyperlipidemia, unspecified: Secondary | ICD-10-CM | POA: Diagnosis not present

## 2018-09-22 DIAGNOSIS — N400 Enlarged prostate without lower urinary tract symptoms: Secondary | ICD-10-CM | POA: Diagnosis not present

## 2018-09-22 DIAGNOSIS — I2510 Atherosclerotic heart disease of native coronary artery without angina pectoris: Secondary | ICD-10-CM | POA: Diagnosis not present

## 2018-09-22 DIAGNOSIS — I69398 Other sequelae of cerebral infarction: Secondary | ICD-10-CM | POA: Diagnosis not present

## 2018-09-22 DIAGNOSIS — Z87891 Personal history of nicotine dependence: Secondary | ICD-10-CM | POA: Diagnosis not present

## 2018-09-22 DIAGNOSIS — Z7951 Long term (current) use of inhaled steroids: Secondary | ICD-10-CM | POA: Diagnosis not present

## 2018-09-22 DIAGNOSIS — M810 Age-related osteoporosis without current pathological fracture: Secondary | ICD-10-CM | POA: Diagnosis not present

## 2018-09-22 DIAGNOSIS — Z85828 Personal history of other malignant neoplasm of skin: Secondary | ICD-10-CM | POA: Diagnosis not present

## 2018-09-22 DIAGNOSIS — M19042 Primary osteoarthritis, left hand: Secondary | ICD-10-CM | POA: Diagnosis not present

## 2018-09-22 LAB — CULTURE, BLOOD (ROUTINE X 2): Special Requests: ADEQUATE

## 2018-09-24 ENCOUNTER — Telehealth: Payer: Self-pay

## 2018-09-24 LAB — CULTURE, BLOOD (ROUTINE X 2)
Culture: NO GROWTH
Special Requests: ADEQUATE

## 2018-09-24 MED ORDER — AMOXICILLIN-POT CLAVULANATE 400-57 MG PO CHEW: 2.0000 | CHEWABLE_TABLET | Freq: Two times a day (BID) | ORAL | 0 refills | Status: DC

## 2018-09-24 NOTE — Telephone Encounter (Signed)
Spoke with patients son, Theon.   Transition Care Management Follow-up Telephone Call  Admit date: 09/18/2018 Discharge date: 09/21/2018 Principal Problem:  Sepsis [facility])   How have you been since you were released from the hospital? "He's doing real good"   Do you understand why you were in the hospital? yes   Do you understand the discharge instructions? yes   Where were you discharged to? Home. Resides with son.    Items Reviewed:  Medications reviewed: no, son did not have meds/list at time of call  Allergies reviewed: yes  Dietary changes reviewed: yes  Referrals reviewed: yes, Home health PT f/u   Functional Questionnaire:   Activities of Daily Living (ADLs):   He states they are independent in the following: All ADLs assisted/monitored by family States they require assistance with the following: None.    Any transportation issues/concerns?: no   Any patient concerns? yes, son requesting Amoxicillin in different form (tablet too large).    Confirmed importance and date/time of follow-up visits scheduled yes  Provider Appointment booked with PCP 09/26/2018  Confirmed with patient if condition begins to worsen call PCP or go to the ER.  Patient was given the office number and encouraged to call back with question or concerns.  : yes

## 2018-09-24 NOTE — Telephone Encounter (Signed)
I sent in chewable Augmentin rather than pill form so pt can tolerate.

## 2018-09-24 NOTE — Telephone Encounter (Signed)
Patient son notified of PCP recommendations and is agreement and expresses an understanding.   [place] for [facility] to Discuss results / PCP recommendations / Schedule patient.

## 2018-09-24 NOTE — Addendum Note (Signed)
Addended by: [name] on: 09/24/2018 12:44 PM   Modules accepted: Orders

## 2018-09-26 ENCOUNTER — Other Ambulatory Visit: Payer: Self-pay

## 2018-09-26 ENCOUNTER — Encounter: Payer: Self-pay | Admitting: Family Medicine

## 2018-09-26 ENCOUNTER — Ambulatory Visit (INDEPENDENT_AMBULATORY_CARE_PROVIDER_SITE_OTHER): Payer: Medicare Other

## 2018-09-26 ENCOUNTER — Ambulatory Visit (INDEPENDENT_AMBULATORY_CARE_PROVIDER_SITE_OTHER): Payer: Medicare Other | Admitting: Family Medicine

## 2018-09-26 VITALS — BP 136/89 | HR 63 | Temp 97.0°F | Resp 16 | Ht 72.0 in | Wt 129.4 lb

## 2018-09-26 DIAGNOSIS — E46 Unspecified protein-calorie malnutrition: Secondary | ICD-10-CM | POA: Insufficient documentation

## 2018-09-26 DIAGNOSIS — J690 Pneumonitis due to inhalation of food and vomit: Secondary | ICD-10-CM

## 2018-09-26 DIAGNOSIS — E440 Moderate protein-calorie malnutrition: Secondary | ICD-10-CM | POA: Diagnosis not present

## 2018-09-26 DIAGNOSIS — M810 Age-related osteoporosis without current pathological fracture: Secondary | ICD-10-CM | POA: Diagnosis not present

## 2018-09-26 DIAGNOSIS — A419 Sepsis, unspecified organism: Secondary | ICD-10-CM | POA: Diagnosis not present

## 2018-09-26 LAB — CBC WITH DIFFERENTIAL/PLATELET
Basophils Absolute: 0 10*3/uL (ref 0.0–0.1)
Basophils Relative: 0.3 % (ref 0.0–3.0)
Eosinophils Absolute: 0.3 10*3/uL (ref 0.0–0.7)
Eosinophils Relative: 2.6 % (ref 0.0–5.0)
HCT: 44.8 % (ref 39.0–52.0)
Hemoglobin: 14.7 g/dL (ref 13.0–17.0)
Lymphocytes Relative: 24.6 % (ref 12.0–46.0)
Lymphs Abs: 2.7 10*3/uL (ref 0.7–4.0)
MCHC: 32.8 g/dL (ref 30.0–36.0)
MCV: 94.2 fl (ref 78.0–100.0)
Monocytes Absolute: 0.8 10*3/uL (ref 0.1–1.0)
Monocytes Relative: 7.7 % (ref 3.0–12.0)
Neutro Abs: 7.1 10*3/uL (ref 1.4–7.7)
Neutrophils Relative %: 64.8 % (ref 43.0–77.0)
Platelets: 278 10*3/uL (ref 150.0–400.0)
RBC: 4.76 Mil/uL (ref 4.22–5.81)
RDW: 14.9 % (ref 11.5–15.5)
WBC: 11 10*3/uL — ABNORMAL HIGH (ref 4.0–10.5)

## 2018-09-26 LAB — BASIC METABOLIC PANEL
BUN: 13 mg/dL (ref 6–23)
CO2: 29 mEq/L (ref 19–32)
Calcium: 9.6 mg/dL (ref 8.4–10.5)
Chloride: 102 mEq/L (ref 96–112)
Creatinine, Ser: 0.74 mg/dL (ref 0.40–1.50)
GFR: 100.55 mL/min (ref >60.00)
Glucose, Bld: 121 mg/dL — ABNORMAL HIGH (ref 70–99)
Potassium: 4.1 mEq/L (ref 3.5–5.1)
Sodium: 140 mEq/L (ref 135–145)

## 2018-09-26 MED ORDER — DENOSUMAB 60 MG/ML ~~LOC~~ SOSY
60.0000 mg | PREFILLED_SYRINGE | Freq: Once | SUBCUTANEOUS | Status: AC
  Administered 2018-09-26: 60 mg via SUBCUTANEOUS

## 2018-09-26 NOTE — Progress Notes (Signed)
   Subjective:    Patient ID: [name], male    DOB: [birth year], 85 y.o.   MRN: [id number]  [facility] f/u- pt was admitted 7/21-24 w/ sepsis, acute hypoxic respiratory failure.  Was given broad spectrum abx before switching to oral Augmentin for suspected aspiration PNA.  Was switched to chewable on 7/27 due to inability to swallow large pills.  Speech evaluated him and placed him on a dysphagia 3 diet.  Was d/c'd home w/ HH. D/c summary recommended repeat CBC and BMP.  Currently afebrile, O2 sats WNL.  Feeling better.  Did not require O2 at time of d/c.  Pt is now supposed to take 'real small bite' and 'drink a lot of liquids'.  Now supposed to drink 3 Ensure daily.  Continues to cough when eating.  No fevers.  Pt still desires to be full code but does not want to be on a ventilator.  Reviewed d/c summary, labs, imaging, H&P.  Med rec performed.   Review of Systems For ROS see HPI     Objective:   Physical Exam Vitals signs reviewed.  Constitutional:      General: He is not in acute distress.    Appearance: He is not ill-appearing.     Comments: Frail, elderly man  HENT:     Head: Normocephalic and atraumatic.  Neck:     Musculoskeletal: Normal range of motion.  Cardiovascular:     Rate and Rhythm: Normal rate and regular rhythm.     Pulses: Normal pulses.     Heart sounds: No murmur.  Pulmonary:     Effort: Pulmonary effort is normal. No respiratory distress.     Breath sounds: No rhonchi or rales.     Comments: Faint bibasilar crackles Abdominal:     General: Abdomen is flat. There is no distension.     Palpations: There is no mass.     Tenderness: There is no guarding.  Lymphadenopathy:     Cervical: No cervical adenopathy.  Skin:    General: Skin is warm and dry.  Neurological:     General: No focal deficit present.     Mental Status: He is alert and oriented to person, place, and time.  Psychiatric:        Mood and Affect: Mood normal.        Behavior:  Behavior normal.        Thought Content: Thought content normal.           Assessment & Plan:

## 2018-09-26 NOTE — Assessment & Plan Note (Signed)
Resolved.  Pt is currently asymptomatic.  Will repeat CBC to ensure WBC has not rebounded.  Will follow.

## 2018-09-26 NOTE — Assessment & Plan Note (Signed)
Recurrent problem for pt.  Is now on dysphagia 3 diet.  Pt is aware that he needs to take small bites and chew thoroughly.  Has chewable Augmentin to complete course of abx.  Will continue to follow.

## 2018-09-26 NOTE — Progress Notes (Signed)
Per orders of Dr. Loanne Drilling injection of Prolia was given today by L. Walker . Patient tolerated injection well.

## 2018-09-26 NOTE — Assessment & Plan Note (Signed)
New.  Pt has lost another 6 lbs.  Stressed to pt and son that he needs to drink his Ensure regularly in addition to 3 meals/day.  Will check BMP and continue to follow.

## 2018-09-26 NOTE — Patient Instructions (Signed)
Follow up as needed or as scheduled We'll notify you of your lab results and make any changes if needed Make sure you are drinking at least 2 Ensure daily Continue to take small bites, chew well, and tuck chin to swallow Finish the Augmentin as directed Complete the paperwork for your Advanced Directive and return it to the office Call with any questions or concerns Hang in there!

## 2018-09-27 ENCOUNTER — Other Ambulatory Visit: Payer: Self-pay | Admitting: Family Medicine

## 2018-09-27 DIAGNOSIS — D72829 Elevated white blood cell count, unspecified: Secondary | ICD-10-CM

## 2018-09-27 DIAGNOSIS — I2510 Atherosclerotic heart disease of native coronary artery without angina pectoris: Secondary | ICD-10-CM | POA: Diagnosis not present

## 2018-09-27 DIAGNOSIS — J449 Chronic obstructive pulmonary disease, unspecified: Secondary | ICD-10-CM | POA: Diagnosis not present

## 2018-09-27 DIAGNOSIS — J690 Pneumonitis due to inhalation of food and vomit: Secondary | ICD-10-CM | POA: Diagnosis not present

## 2018-09-27 DIAGNOSIS — I69398 Other sequelae of cerebral infarction: Secondary | ICD-10-CM | POA: Diagnosis not present

## 2018-09-27 DIAGNOSIS — R1310 Dysphagia, unspecified: Secondary | ICD-10-CM | POA: Diagnosis not present

## 2018-09-27 DIAGNOSIS — R2689 Other abnormalities of gait and mobility: Secondary | ICD-10-CM | POA: Diagnosis not present

## 2018-09-28 ENCOUNTER — Ambulatory Visit: Payer: Medicare Other

## 2018-10-02 DIAGNOSIS — I2510 Atherosclerotic heart disease of native coronary artery without angina pectoris: Secondary | ICD-10-CM | POA: Diagnosis not present

## 2018-10-02 DIAGNOSIS — J449 Chronic obstructive pulmonary disease, unspecified: Secondary | ICD-10-CM | POA: Diagnosis not present

## 2018-10-02 DIAGNOSIS — R1310 Dysphagia, unspecified: Secondary | ICD-10-CM | POA: Diagnosis not present

## 2018-10-02 DIAGNOSIS — J690 Pneumonitis due to inhalation of food and vomit: Secondary | ICD-10-CM | POA: Diagnosis not present

## 2018-10-02 DIAGNOSIS — R2689 Other abnormalities of gait and mobility: Secondary | ICD-10-CM | POA: Diagnosis not present

## 2018-10-02 DIAGNOSIS — I69398 Other sequelae of cerebral infarction: Secondary | ICD-10-CM | POA: Diagnosis not present

## 2018-10-05 ENCOUNTER — Other Ambulatory Visit: Payer: Self-pay

## 2018-10-05 ENCOUNTER — Ambulatory Visit (INDEPENDENT_AMBULATORY_CARE_PROVIDER_SITE_OTHER): Payer: Medicare Other | Admitting: *Deleted

## 2018-10-05 ENCOUNTER — Ambulatory Visit: Payer: Medicare Other

## 2018-10-05 DIAGNOSIS — D72829 Elevated white blood cell count, unspecified: Secondary | ICD-10-CM

## 2018-10-05 LAB — CBC WITH DIFFERENTIAL/PLATELET
Basophils Absolute: 0 10*3/uL (ref 0.0–0.1)
Basophils Relative: 0.4 % (ref 0.0–3.0)
Eosinophils Absolute: 0.2 10*3/uL (ref 0.0–0.7)
Eosinophils Relative: 2.1 % (ref 0.0–5.0)
HCT: 45.9 % (ref 39.0–52.0)
Hemoglobin: 15.1 g/dL (ref 13.0–17.0)
Lymphocytes Relative: 26.5 % (ref 12.0–46.0)
Lymphs Abs: 2.9 10*3/uL (ref 0.7–4.0)
MCHC: 32.9 g/dL (ref 30.0–36.0)
MCV: 94.7 fl (ref 78.0–100.0)
Monocytes Absolute: 0.9 10*3/uL (ref 0.1–1.0)
Monocytes Relative: 8.4 % (ref 3.0–12.0)
Neutro Abs: 6.9 10*3/uL (ref 1.4–7.7)
Neutrophils Relative %: 62.6 % (ref 43.0–77.0)
Platelets: 267 10*3/uL (ref 150.0–400.0)
RBC: 4.85 Mil/uL (ref 4.22–5.81)
RDW: 14.9 % (ref 11.5–15.5)
WBC: 11 10*3/uL — ABNORMAL HIGH (ref 4.0–10.5)

## 2018-10-08 DIAGNOSIS — J449 Chronic obstructive pulmonary disease, unspecified: Secondary | ICD-10-CM | POA: Diagnosis not present

## 2018-10-08 DIAGNOSIS — J690 Pneumonitis due to inhalation of food and vomit: Secondary | ICD-10-CM | POA: Diagnosis not present

## 2018-10-08 DIAGNOSIS — R1310 Dysphagia, unspecified: Secondary | ICD-10-CM | POA: Diagnosis not present

## 2018-10-08 DIAGNOSIS — I69398 Other sequelae of cerebral infarction: Secondary | ICD-10-CM | POA: Diagnosis not present

## 2018-10-08 DIAGNOSIS — R2689 Other abnormalities of gait and mobility: Secondary | ICD-10-CM | POA: Diagnosis not present

## 2018-10-08 DIAGNOSIS — I2510 Atherosclerotic heart disease of native coronary artery without angina pectoris: Secondary | ICD-10-CM | POA: Diagnosis not present

## 2018-10-10 ENCOUNTER — Other Ambulatory Visit: Payer: Self-pay

## 2018-10-10 ENCOUNTER — Encounter: Payer: Self-pay | Admitting: Family Medicine

## 2018-10-10 ENCOUNTER — Telehealth: Payer: Self-pay | Admitting: Family Medicine

## 2018-10-10 ENCOUNTER — Ambulatory Visit (INDEPENDENT_AMBULATORY_CARE_PROVIDER_SITE_OTHER): Payer: Medicare Other | Admitting: Family Medicine

## 2018-10-10 DIAGNOSIS — E785 Hyperlipidemia, unspecified: Secondary | ICD-10-CM | POA: Diagnosis not present

## 2018-10-10 DIAGNOSIS — I10 Essential (primary) hypertension: Secondary | ICD-10-CM | POA: Diagnosis not present

## 2018-10-10 DIAGNOSIS — M810 Age-related osteoporosis without current pathological fracture: Secondary | ICD-10-CM

## 2018-10-10 NOTE — Progress Notes (Signed)
Virtual Visit via Video   I connected with patient on 10/10/18 at  8:30 AM EDT by a video enabled telemedicine application and verified that I am speaking with the correct person using two identifiers.  Location patient: Home Location provider: Acup[facility]urist, Office Persons participating in the virtual visit: Patient, Provider, [place] (Jess B)  I discussed the limitations of evaluation and management by telemedicine and the availability of in person appointments. The patient expressed understanding and agreed to proceed.  Subjective:   HPI:  HTN- chronic problem, on Atenolol 25mg  daily.  No CP, SOB above baseline.  Denies HAs, edema.  Hyperlipidemia- chronic problem, on Lipitor 20mg  daily.  Pt was able to walk around the cul-de-sac on Monday.  No abd pain, N/V.  Is drinking Ensure.  Osteoporosis- pt received his Prolia injxn via Endo.  Family wants to know why he's on both Prolia and Fosamax.  He has been on Fosamax since 2015 and may want to discuss a drug holiday w/ Endo.  ROS:   See pertinent positives and negatives per HPI.  Patient Active Problem List   Diagnosis Date Noted  . Protein-calorie malnutrition ([place]) 09/26/2018  . Sepsis ([place]) 09/19/2018  . Aspiration pneumonia ([place]) 09/19/2018  . Urinary retention 05/30/2018  . Fall   . Bilateral pneumonia 05/27/2018  . Syncope 05/26/2018  . Thyroid nodule 04/12/2018  . Physical exam 10/08/2014  . Facial skin lesion 04/08/2014  . HTN (hypertension) 10/15/2013  . Osteoporosis 10/15/2013  . History of CVA (cerebrovascular accident) 10/15/2013  . History of compression fracture of spine 10/15/2013  . Recurrent pneumonia 10/15/2013  . Hyperlipidemia 10/15/2013  . Low testosterone 10/15/2013  . Carotid stenosis 10/15/2013    Social History   Tobacco Use  . Smoking status: Former Smoker    Packs/day: 2.00    Years: 20.00    Pack years: 40.00    Types: Cigarettes    Quit date: 02/29/1988    Years since quitting:  30.6  . Smokeless tobacco: Never Used  Substance Use Topics  . Alcohol use: No    Alcohol/week: 0.0 standard drinks    Current Outpatient Medications:  .  acetaminophen (TYLENOL) 500 MG tablet, Take 500-1,000 mg by mouth every 8 (eight) hours as needed for mild pain or headache. , Disp: , Rfl:  .  alendronate (FOSAMAX) 70 MG tablet, Take 1 tablet (70 mg total) by mouth every Friday. take with a full glass of water on an empty stomach, Disp: 12 tablet, Rfl: 3 .  aspirin EC 81 MG tablet, Take 81 mg by mouth daily., Disp: , Rfl:  .  atenolol (TENORMIN) 25 MG tablet, Take 1 tablet (25 mg total) by mouth daily., Disp: 30 tablet, Rfl: 3 .  atorvastatin (LIPITOR) 20 MG tablet, TAKE ONE TABLET BY MOUTH DAILY (Patient taking differently: Take 20 mg by mouth daily. ), Disp: 90 tablet, Rfl: 0 .  calcium-vitamin D (OSCAL WITH D) 500-200 MG-UNIT tablet, Take 1 tablet by mouth 2 (two) times daily., Disp: , Rfl:  .  Cholecalciferol (VITAMIN D-3) 1000 units CAPS, Take 1,000 Units by mouth 2 (two) times a day. , Disp: , Rfl:  .  denosumab (PROLIA) 60 MG/ML SOSY injection, Inject 60 mg into the skin every 6 (six) months., Disp: , Rfl:  .  feeding supplement, ENSURE ENLIVE, (ENSURE ENLIVE) LIQD, Take 237 mLs by mouth 3 (three) times daily between meals. (Patient taking differently: Take 237 mLs by mouth 2 (two) times daily between meals. ), Disp:  237 mL, Rfl: 12 .  fluticasone (FLOVENT HFA) 110 MCG/ACT inhaler, USE 2 INHALATIONS TWICE A DAY (Patient taking differently: Inhale 2 puffs into the lungs 2 (two) times a day. ), Disp: 36 g, Rfl: 3 .  ipratropium (ATROVENT HFA) 17 MCG/ACT inhaler, Inhale 2 puffs into the lungs every 6 (six) hours as needed for wheezing., Disp: 3 Inhaler, Rfl: 3 .  lidocaine (LIDODERM) 5 %, Place 1 patch onto the skin daily. Remove & Discard patch within 12 hours or as directed by MD, Disp: 30 patch, Rfl: 0 .  Multiple Vitamin (MULTIVITAMIN WITH MINERALS) TABS tablet, Take 1 tablet by mouth  daily., Disp: 30 tablet, Rfl: 1 .  Polyethyl Glycol-Propyl Glycol (SYSTANE) 0.4-0.3 % SOLN, Place 1 drop into both eyes 3 (three) times daily as needed (dry eyes). , Disp: , Rfl:  .  tamsulosin (FLOMAX) 0.4 MG CAPS capsule, TAKE ONE CAPSULE BY MOUTH DAILY (Patient taking differently: Take 0.4 mg by mouth daily. ), Disp: 90 capsule, Rfl: 0 .  traMADol (ULTRAM) 50 MG tablet, Take 50 mg by mouth every 6 (six) hours as needed for moderate pain. , Disp: , Rfl:   No Known Allergies  Objective:   There were no vitals taken for this visit. AAOx3, NAD NCAT, EOMI No obvious CN deficits Coloring WNL Pt is able to speak clearly, coherently without shortness of breath or increased work of breathing.  Thought process is linear.  Mood is appropriate.   Assessment and Plan:   HTN- chronic problem.  Hx of good control.  Currently asymptomatic.  Check labs.  No anticipated med changes.  Hyperlipidemia- chronic problem, tolerating statin w/o difficulty.  Check labs.  Adjust meds prn   Osteoporosis- chronic problem, following w/ Dr Loanne Drilling.  He has been on Fosamax x5 yrs and may want to discuss a drug holiday.   Annye Asa, MD 10/10/2018

## 2018-10-10 NOTE — Telephone Encounter (Signed)
LM asking pt to call back to schedule 55mth f/up with KT for a BP and chol.

## 2018-10-10 NOTE — Progress Notes (Signed)
I have discussed the procedure for the virtual visit with the patient who has given consent to proceed with assessment and treatment.   Pt family unable to obtain vitals for pt  [name], CMA

## 2018-10-12 ENCOUNTER — Other Ambulatory Visit: Payer: Self-pay

## 2018-10-12 ENCOUNTER — Ambulatory Visit (INDEPENDENT_AMBULATORY_CARE_PROVIDER_SITE_OTHER): Payer: Medicare Other

## 2018-10-12 ENCOUNTER — Encounter: Payer: Self-pay | Admitting: General Practice

## 2018-10-12 DIAGNOSIS — E785 Hyperlipidemia, unspecified: Secondary | ICD-10-CM

## 2018-10-12 DIAGNOSIS — I10 Essential (primary) hypertension: Secondary | ICD-10-CM | POA: Diagnosis not present

## 2018-10-12 LAB — CBC WITH DIFFERENTIAL/PLATELET
Basophils Absolute: 0 10*3/uL (ref 0.0–0.1)
Basophils Relative: 0.3 % (ref 0.0–3.0)
Eosinophils Absolute: 0.2 10*3/uL (ref 0.0–0.7)
Eosinophils Relative: 1.9 % (ref 0.0–5.0)
HCT: 45.4 % (ref 39.0–52.0)
Hemoglobin: 14.8 g/dL (ref 13.0–17.0)
Lymphocytes Relative: 29.7 % (ref 12.0–46.0)
Lymphs Abs: 2.9 10*3/uL (ref 0.7–4.0)
MCHC: 32.7 g/dL (ref 30.0–36.0)
MCV: 94.5 fl (ref 78.0–100.0)
Monocytes Absolute: 0.8 10*3/uL (ref 0.1–1.0)
Monocytes Relative: 8.7 % (ref 3.0–12.0)
Neutro Abs: 5.7 10*3/uL (ref 1.4–7.7)
Neutrophils Relative %: 59.4 % (ref 43.0–77.0)
Platelets: 204 10*3/uL (ref 150.0–400.0)
RBC: 4.8 Mil/uL (ref 4.22–5.81)
RDW: 14.3 % (ref 11.5–15.5)
WBC: 9.6 10*3/uL (ref 4.0–10.5)

## 2018-10-12 LAB — BASIC METABOLIC PANEL
BUN: 13 mg/dL (ref 6–23)
CO2: 29 mEq/L (ref 19–32)
Calcium: 8.7 mg/dL (ref 8.4–10.5)
Chloride: 105 mEq/L (ref 96–112)
Creatinine, Ser: 0.68 mg/dL (ref 0.40–1.50)
GFR: 110.85 mL/min (ref >60.00)
Glucose, Bld: 109 mg/dL — ABNORMAL HIGH (ref 70–99)
Potassium: 4.1 mEq/L (ref 3.5–5.1)
Sodium: 141 mEq/L (ref 135–145)

## 2018-10-12 LAB — HEPATIC FUNCTION PANEL
ALT: 17 U/L (ref 0–53)
AST: 16 U/L (ref 0–37)
Albumin: 3.9 g/dL (ref 3.5–5.2)
Alkaline Phosphatase: 68 U/L (ref 39–117)
Bilirubin, Direct: 0.2 mg/dL (ref 0.0–0.3)
Total Bilirubin: 0.7 mg/dL (ref 0.2–1.2)
Total Protein: 5.5 g/dL — ABNORMAL LOW (ref 6.0–8.3)

## 2018-10-12 LAB — LIPID PANEL
Cholesterol: 102 mg/dL (ref 0–200)
HDL: 37.8 mg/dL — ABNORMAL LOW (ref >39.00)
LDL Cholesterol: 49 mg/dL (ref 0–99)
NonHDL: 63.95
Total CHOL/HDL Ratio: 3
Triglycerides: 73 mg/dL (ref 0.0–149.0)
VLDL: 14.6 mg/dL (ref 0.0–40.0)

## 2018-10-12 LAB — TSH: TSH: 0.63 u[IU]/mL (ref 0.35–4.50)

## 2018-10-15 DIAGNOSIS — R2689 Other abnormalities of gait and mobility: Secondary | ICD-10-CM | POA: Diagnosis not present

## 2018-10-15 DIAGNOSIS — J690 Pneumonitis due to inhalation of food and vomit: Secondary | ICD-10-CM | POA: Diagnosis not present

## 2018-10-15 DIAGNOSIS — R1310 Dysphagia, unspecified: Secondary | ICD-10-CM | POA: Diagnosis not present

## 2018-10-15 DIAGNOSIS — I2510 Atherosclerotic heart disease of native coronary artery without angina pectoris: Secondary | ICD-10-CM | POA: Diagnosis not present

## 2018-10-15 DIAGNOSIS — I69398 Other sequelae of cerebral infarction: Secondary | ICD-10-CM | POA: Diagnosis not present

## 2018-10-15 DIAGNOSIS — J449 Chronic obstructive pulmonary disease, unspecified: Secondary | ICD-10-CM | POA: Diagnosis not present

## 2018-10-19 ENCOUNTER — Telehealth: Payer: Self-pay | Admitting: Family Medicine

## 2018-10-19 NOTE — Telephone Encounter (Signed)
I have placed a HH Cert. And plan of care in the bin with a chargesheet.

## 2018-10-22 DIAGNOSIS — N400 Enlarged prostate without lower urinary tract symptoms: Secondary | ICD-10-CM

## 2018-10-22 DIAGNOSIS — I69398 Other sequelae of cerebral infarction: Secondary | ICD-10-CM | POA: Diagnosis not present

## 2018-10-22 DIAGNOSIS — Z7951 Long term (current) use of inhaled steroids: Secondary | ICD-10-CM

## 2018-10-22 DIAGNOSIS — M810 Age-related osteoporosis without current pathological fracture: Secondary | ICD-10-CM | POA: Diagnosis not present

## 2018-10-22 DIAGNOSIS — Z87891 Personal history of nicotine dependence: Secondary | ICD-10-CM

## 2018-10-22 DIAGNOSIS — I10 Essential (primary) hypertension: Secondary | ICD-10-CM | POA: Diagnosis not present

## 2018-10-22 DIAGNOSIS — R2689 Other abnormalities of gait and mobility: Secondary | ICD-10-CM | POA: Diagnosis not present

## 2018-10-22 DIAGNOSIS — E785 Hyperlipidemia, unspecified: Secondary | ICD-10-CM | POA: Diagnosis not present

## 2018-10-22 DIAGNOSIS — I2510 Atherosclerotic heart disease of native coronary artery without angina pectoris: Secondary | ICD-10-CM | POA: Diagnosis not present

## 2018-10-22 DIAGNOSIS — R1310 Dysphagia, unspecified: Secondary | ICD-10-CM | POA: Diagnosis not present

## 2018-10-22 DIAGNOSIS — M19041 Primary osteoarthritis, right hand: Secondary | ICD-10-CM | POA: Diagnosis not present

## 2018-10-22 DIAGNOSIS — J449 Chronic obstructive pulmonary disease, unspecified: Secondary | ICD-10-CM | POA: Diagnosis not present

## 2018-10-22 DIAGNOSIS — Z85828 Personal history of other malignant neoplasm of skin: Secondary | ICD-10-CM | POA: Diagnosis not present

## 2018-10-22 DIAGNOSIS — J690 Pneumonitis due to inhalation of food and vomit: Secondary | ICD-10-CM | POA: Diagnosis not present

## 2018-10-22 DIAGNOSIS — Z7982 Long term (current) use of aspirin: Secondary | ICD-10-CM

## 2018-10-22 DIAGNOSIS — M19042 Primary osteoarthritis, left hand: Secondary | ICD-10-CM | POA: Diagnosis not present

## 2018-10-22 NOTE — Telephone Encounter (Signed)
Picked up from the back, faxed, and sent to scan  

## 2018-10-22 NOTE — Telephone Encounter (Signed)
Paperwork given to PCP for completion.  

## 2018-10-22 NOTE — Telephone Encounter (Signed)
fyi

## 2018-10-22 NOTE — Telephone Encounter (Signed)
Form completed and placed in basket  

## 2018-10-26 DIAGNOSIS — R2689 Other abnormalities of gait and mobility: Secondary | ICD-10-CM | POA: Diagnosis not present

## 2018-10-26 DIAGNOSIS — J449 Chronic obstructive pulmonary disease, unspecified: Secondary | ICD-10-CM | POA: Diagnosis not present

## 2018-10-26 DIAGNOSIS — J690 Pneumonitis due to inhalation of food and vomit: Secondary | ICD-10-CM | POA: Diagnosis not present

## 2018-10-26 DIAGNOSIS — I69398 Other sequelae of cerebral infarction: Secondary | ICD-10-CM | POA: Diagnosis not present

## 2018-10-26 DIAGNOSIS — I2510 Atherosclerotic heart disease of native coronary artery without angina pectoris: Secondary | ICD-10-CM | POA: Diagnosis not present

## 2018-10-26 DIAGNOSIS — R1310 Dysphagia, unspecified: Secondary | ICD-10-CM | POA: Diagnosis not present

## 2018-10-29 ENCOUNTER — Other Ambulatory Visit: Payer: Self-pay | Admitting: Family Medicine

## 2018-11-02 ENCOUNTER — Other Ambulatory Visit: Payer: Self-pay | Admitting: Family Medicine

## 2018-11-02 DIAGNOSIS — J690 Pneumonitis due to inhalation of food and vomit: Secondary | ICD-10-CM | POA: Diagnosis not present

## 2018-11-02 DIAGNOSIS — I69398 Other sequelae of cerebral infarction: Secondary | ICD-10-CM | POA: Diagnosis not present

## 2018-11-02 DIAGNOSIS — R1310 Dysphagia, unspecified: Secondary | ICD-10-CM | POA: Diagnosis not present

## 2018-11-02 DIAGNOSIS — R2689 Other abnormalities of gait and mobility: Secondary | ICD-10-CM | POA: Diagnosis not present

## 2018-11-02 DIAGNOSIS — J449 Chronic obstructive pulmonary disease, unspecified: Secondary | ICD-10-CM | POA: Diagnosis not present

## 2018-11-02 DIAGNOSIS — I2510 Atherosclerotic heart disease of native coronary artery without angina pectoris: Secondary | ICD-10-CM | POA: Diagnosis not present

## 2018-11-06 ENCOUNTER — Other Ambulatory Visit: Payer: Self-pay

## 2018-11-07 ENCOUNTER — Ambulatory Visit (INDEPENDENT_AMBULATORY_CARE_PROVIDER_SITE_OTHER): Payer: Medicare Other

## 2018-11-07 ENCOUNTER — Encounter: Payer: Self-pay | Admitting: Family Medicine

## 2018-11-07 DIAGNOSIS — Z23 Encounter for immunization: Secondary | ICD-10-CM

## 2018-11-08 DIAGNOSIS — R2689 Other abnormalities of gait and mobility: Secondary | ICD-10-CM | POA: Diagnosis not present

## 2018-11-08 DIAGNOSIS — R1310 Dysphagia, unspecified: Secondary | ICD-10-CM | POA: Diagnosis not present

## 2018-11-08 DIAGNOSIS — J449 Chronic obstructive pulmonary disease, unspecified: Secondary | ICD-10-CM | POA: Diagnosis not present

## 2018-11-08 DIAGNOSIS — I69398 Other sequelae of cerebral infarction: Secondary | ICD-10-CM | POA: Diagnosis not present

## 2018-11-08 DIAGNOSIS — I2510 Atherosclerotic heart disease of native coronary artery without angina pectoris: Secondary | ICD-10-CM | POA: Diagnosis not present

## 2018-11-08 DIAGNOSIS — J690 Pneumonitis due to inhalation of food and vomit: Secondary | ICD-10-CM | POA: Diagnosis not present

## 2018-11-13 DIAGNOSIS — I69398 Other sequelae of cerebral infarction: Secondary | ICD-10-CM | POA: Diagnosis not present

## 2018-11-13 DIAGNOSIS — R2689 Other abnormalities of gait and mobility: Secondary | ICD-10-CM | POA: Diagnosis not present

## 2018-11-13 DIAGNOSIS — I2510 Atherosclerotic heart disease of native coronary artery without angina pectoris: Secondary | ICD-10-CM | POA: Diagnosis not present

## 2018-11-13 DIAGNOSIS — R1310 Dysphagia, unspecified: Secondary | ICD-10-CM | POA: Diagnosis not present

## 2018-11-13 DIAGNOSIS — J449 Chronic obstructive pulmonary disease, unspecified: Secondary | ICD-10-CM | POA: Diagnosis not present

## 2018-11-13 DIAGNOSIS — J690 Pneumonitis due to inhalation of food and vomit: Secondary | ICD-10-CM | POA: Diagnosis not present

## 2018-11-19 DIAGNOSIS — I69398 Other sequelae of cerebral infarction: Secondary | ICD-10-CM | POA: Diagnosis not present

## 2018-11-19 DIAGNOSIS — I2510 Atherosclerotic heart disease of native coronary artery without angina pectoris: Secondary | ICD-10-CM | POA: Diagnosis not present

## 2018-11-19 DIAGNOSIS — J690 Pneumonitis due to inhalation of food and vomit: Secondary | ICD-10-CM | POA: Diagnosis not present

## 2018-11-19 DIAGNOSIS — R2689 Other abnormalities of gait and mobility: Secondary | ICD-10-CM | POA: Diagnosis not present

## 2018-11-19 DIAGNOSIS — R1310 Dysphagia, unspecified: Secondary | ICD-10-CM | POA: Diagnosis not present

## 2018-11-19 DIAGNOSIS — J449 Chronic obstructive pulmonary disease, unspecified: Secondary | ICD-10-CM | POA: Diagnosis not present

## 2018-11-22 ENCOUNTER — Other Ambulatory Visit: Payer: Self-pay

## 2018-11-26 ENCOUNTER — Other Ambulatory Visit: Payer: Self-pay

## 2018-11-26 ENCOUNTER — Ambulatory Visit (INDEPENDENT_AMBULATORY_CARE_PROVIDER_SITE_OTHER): Payer: Medicare Other | Admitting: Endocrinology

## 2018-11-26 ENCOUNTER — Encounter: Payer: Self-pay | Admitting: Endocrinology

## 2018-11-26 VITALS — BP 144/88 | HR 117 | Ht 72.0 in | Wt 126.6 lb

## 2018-11-26 DIAGNOSIS — Z125 Encounter for screening for malignant neoplasm of prostate: Secondary | ICD-10-CM | POA: Diagnosis not present

## 2018-11-26 DIAGNOSIS — M810 Age-related osteoporosis without current pathological fracture: Secondary | ICD-10-CM

## 2018-11-26 LAB — VITAMIN D 25 HYDROXY (VIT D DEFICIENCY, FRACTURES): VITD: 61.65 ng/mL (ref 30.00–100.00)

## 2018-11-26 LAB — PSA, MEDICARE: PSA: 2.4 ng/ml (ref 0.10–4.00)

## 2018-11-26 NOTE — Patient Instructions (Addendum)
Your blood pressure is high today.  Please see your primary care provider soon, to have it rechecked Blood tests are requested for you today.  We'll let you know about the results.

## 2018-11-26 NOTE — Progress Notes (Signed)
Subjective:    Patient ID: [name], male    DOB: Sep 10, 1931, 85 y.o.   MRN: [id number]  HPI The state of at least three ongoing medical problems is addressed today, with interval history of each noted here: osteoporosis: (fosamax was started in 2015, Prolia in 2018; he has hypogonadism, but rx of this is precluded by BPH; CXR has shown vertebral compression fractures; last DEXA in 2019: worst T-score was -4.7).  heartburn is mild.  This is a stable problem.   Vit-D def: he takes non-rx cholecalciferol, 1000 units/day.  Denies leg cramps and foot numbness.  This is a stable problem.   BPH: Urinary stream is still slow, but it is improved.  This is a stable problem.   Past Medical History:  Diagnosis Date  . Arthritis    hands   . BPH (benign prostatic hyperplasia)   . COPD (chronic obstructive pulmonary disease) ([place])   . Coronary artery disease   . CVA (cerebral infarction) 3 years ago   mild stroke  . HLD (hyperlipidemia)   . Hypertension   . Osteoporosis   . Pneumonia   . 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 SCC (squamous cell carcinoma) in situ 12/12/2017   left cheek inf.  . 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 BACK SURGERY    . LAPAROSCOPIC APPENDECTOMY N/A 11/02/2013   Procedure: APPENDECTOMY LAPAROSCOPIC;  Surgeon: Donnie Mesa, MD;  Location: [facility];  Service: General;  Laterality: N/A;    Social History   Socioeconomic History  . Marital status: Widowed    Spouse name: Not on file  . Number of children: Not on file  . Years of education: Not on file  . Highest education level: Not on file  Occupational History  . Occupation: Retired  Scientific laboratory technician  . Financial resource strain: Not on file  . Food insecurity    Worry: Not on file    Inability: Not on file  . Transportation needs    Medical: Not on file    Non-medical: Not on file   Tobacco Use  . Smoking status: Former Smoker    Packs/day: 2.00    Years: 20.00    Pack years: 40.00    Types: Cigarettes    Quit date: 02/29/1988    Years since quitting: 30.7  . Smokeless tobacco: Never Used  Substance and Sexual Activity  . Alcohol use: No    Alcohol/week: 0.0 standard drinks  . Drug use: No  . Sexual activity: Not Currently  Lifestyle  . Physical activity    Days per week: Not on file    Minutes per session: Not on file  . Stress: Not on file  Relationships  . Social Herbalist on phone: Not on file    Gets together: Not on file    Attends religious service: Not on file    Active member of club or organization: Not on file    Attends meetings of clubs or organizations: Not on file    Relationship status: Not on file  . Intimate partner violence    Fear of current or ex partner: Not on file    Emotionally abused: Not on file    Physically abused: Not on file    Forced sexual activity: Not on file  Other Topics Concern  .  Not on file  Social History Narrative  . Not on file    Current Outpatient Medications on File Prior to Visit  Medication Sig Dispense Refill  . acetaminophen (TYLENOL) 500 MG tablet Take 500-1,000 mg by mouth every 8 (eight) hours as needed for mild pain or headache.     . alendronate (FOSAMAX) 70 MG tablet Take 1 tablet (70 mg total) by mouth every Friday. take with a full glass of water on an empty stomach 12 tablet 3  . aspirin EC 81 MG tablet Take 81 mg by mouth daily.    [name] atenolol (TENORMIN) 25 MG tablet TAKE ONE TABLET BY MOUTH DAILY 90 tablet 0  . atorvastatin (LIPITOR) 20 MG tablet TAKE ONE TABLET BY MOUTH DAILY (Patient taking differently: Take 20 mg by mouth daily. ) 90 tablet 0  . calcium-vitamin D (OSCAL WITH D) 500-200 MG-UNIT tablet Take 1 tablet by mouth 2 (two) times daily.    . Cholecalciferol (VITAMIN D-3) 1000 units CAPS Take 1,000 Units by mouth 2 (two) times a day.     . denosumab (PROLIA) 60 MG/ML  SOSY injection Inject 60 mg into the skin every 6 (six) months.    . feeding supplement, ENSURE ENLIVE, (ENSURE ENLIVE) LIQD Take 237 mLs by mouth 3 (three) times daily between meals. (Patient taking differently: Take 237 mLs by mouth 2 (two) times daily between meals. ) 237 mL 12  . fluticasone (FLOVENT HFA) 110 MCG/ACT inhaler USE 2 INHALATIONS TWICE A DAY (Patient taking differently: Inhale 2 puffs into the lungs 2 (two) times a day. ) 36 g 3  . ipratropium (ATROVENT HFA) 17 MCG/ACT inhaler Inhale 2 puffs into the lungs every 6 (six) hours as needed for wheezing. 3 Inhaler 3  . lidocaine (LIDODERM) 5 % Place 1 patch onto the skin daily. Remove & Discard patch within 12 hours or as directed by MD 30 patch 0  . Multiple Vitamin (MULTIVITAMIN WITH MINERALS) TABS tablet Take 1 tablet by mouth daily. 30 tablet 1  . Polyethyl Glycol-Propyl Glycol (SYSTANE) 0.4-0.3 % SOLN Place 1 drop into both eyes 3 (three) times daily as needed (dry eyes).     . tamsulosin (FLOMAX) 0.4 MG CAPS capsule TAKE ONE CAPSULE BY MOUTH DAILY 90 capsule 0  . traMADol (ULTRAM) 50 MG tablet Take 50 mg by mouth every 6 (six) hours as needed for moderate pain.      No current facility-administered medications on file prior to visit.     No Known Allergies  Family History  Problem Relation Age of Onset  . Colon cancer Brother   . Heart disease Brother   . Cancer Father   . Colon cancer Brother   . Aneurysm Sister     BP (!) 144/88 (BP Location: Left Arm, Patient Position: Sitting, Cuff Size: Normal)   Pulse (!) 117   Ht 6' (1.829 m)   Wt 126 lb 9.6 oz (57.4 kg)   SpO2 91%   BMI 17.17 kg/m    Review of Systems Denies falls.      Objective:   Physical Exam VITAL SIGNS:  See vs page GENERAL: no distress GAIT: normal and steady, with a cane.       Assessment & Plan:  Your blood pressure is high today.  Please see your primary care provider soon, to have it rechecked.   Osteoporosis: we discussed  medications.  I favor continuing the Fosamax, as ongoing benefit of fracture prevention outweighs risk of atypical fx.  We discussed.  He declines Forteo/Tymlos.   BPH: recheck PSA  Patient Instructions  Your blood pressure is high today.  Please see your primary care provider soon, to have it rechecked Blood tests are requested for you today.  We'll let you know about the results.

## 2018-11-27 LAB — PTH, INTACT AND CALCIUM
Calcium: 9.3 mg/dL (ref 8.6–10.3)
PTH: 27 pg/mL (ref 14–64)

## 2018-12-06 ENCOUNTER — Other Ambulatory Visit: Payer: Self-pay | Admitting: Family Medicine

## 2019-01-28 ENCOUNTER — Other Ambulatory Visit: Payer: Self-pay | Admitting: Dermatology

## 2019-01-28 DIAGNOSIS — C4442 Squamous cell carcinoma of skin of scalp and neck: Secondary | ICD-10-CM | POA: Diagnosis not present

## 2019-01-31 ENCOUNTER — Other Ambulatory Visit: Payer: Self-pay | Admitting: Family Medicine

## 2019-01-31 DIAGNOSIS — Z20828 Contact with and (suspected) exposure to other viral communicable diseases: Secondary | ICD-10-CM | POA: Diagnosis not present

## 2019-03-14 ENCOUNTER — Encounter: Payer: Self-pay | Admitting: Family Medicine

## 2019-03-21 ENCOUNTER — Ambulatory Visit: Payer: Medicare Other | Attending: Internal Medicine

## 2019-03-21 DIAGNOSIS — Z23 Encounter for immunization: Secondary | ICD-10-CM | POA: Insufficient documentation

## 2019-03-21 NOTE — Progress Notes (Signed)
   Covid-19 Vaccination Clinic  Name:  [name]    MRN: [id number] DOB: [birth year]  03/21/2019  [name] was observed post Covid-19 immunization for 15 minutes without incidence. He was provided with Vaccine Information Sheet and instruction to access the V-Safe system.   [name] was instructed to call 911 with any severe reactions post vaccine: [name] Difficulty breathing  . Swelling of your face and throat  . A fast heartbeat  . A bad rash all over your body  . Dizziness and weakness    Immunizations Administered    Name Date Dose VIS Date Route   Pfizer COVID-19 Vaccine 03/21/2019 12:11 PM 0.3 mL 02/08/2019 Intramuscular   Manufacturer: [place]   Lot: [id number]   [place]: [id number]

## 2019-04-04 ENCOUNTER — Other Ambulatory Visit: Payer: Self-pay

## 2019-04-04 ENCOUNTER — Ambulatory Visit (INDEPENDENT_AMBULATORY_CARE_PROVIDER_SITE_OTHER): Payer: Medicare Other

## 2019-04-04 DIAGNOSIS — M810 Age-related osteoporosis without current pathological fracture: Secondary | ICD-10-CM | POA: Diagnosis not present

## 2019-04-04 MED ORDER — DENOSUMAB 60 MG/ML ~~LOC~~ SOSY
60.0000 mg | PREFILLED_SYRINGE | Freq: Once | SUBCUTANEOUS | Status: AC
  Administered 2019-04-04: 10:00:00 60 mg via SUBCUTANEOUS

## 2019-04-04 NOTE — Progress Notes (Signed)
Per orders of Dr. Loanne Drilling injection of Prolia given today in L upper outer aspect of arm today by A. Geovanny Sartin, LPN . Denies any adverse reactions or discomfort.

## 2019-04-11 ENCOUNTER — Ambulatory Visit: Payer: Medicare Other | Attending: Internal Medicine

## 2019-04-11 ENCOUNTER — Telehealth: Payer: Self-pay | Admitting: General Practice

## 2019-04-11 DIAGNOSIS — Z23 Encounter for immunization: Secondary | ICD-10-CM | POA: Insufficient documentation

## 2019-04-11 NOTE — Telephone Encounter (Signed)
Ok for in office as long as he screens negative for COVID sxs

## 2019-04-11 NOTE — Telephone Encounter (Signed)
Please advise?   Ok for an in office visit?    [name]   ----- Message -----  From: [name]  Sent: 04/11/2019  1:13 PM EST  To: [facility]  Subject: Appointment scheduled from [place] For: [name] ([id number])  Visit Type: OFFICE VISIT (1004)    04/17/2019  11:30 AM 30 mins. [name], MD  [facility]    Patient Comments:  Swelling of legs and feet

## 2019-04-11 NOTE — Telephone Encounter (Signed)
Ok to schedule in office.

## 2019-04-11 NOTE — Progress Notes (Signed)
   Covid-19 Vaccination Clinic  Name:  TYREES CROFTON    MRN: [id number] DOB: 1931-04-29  04/11/2019  Mr. Goh was observed post Covid-19 immunization for 15 minutes without incidence. He was provided with Vaccine Information Sheet and instruction to access the V-Safe system.   Mr. Pebbles was instructed to call 911 with any severe reactions post vaccine: [name] Difficulty breathing  . Swelling of your face and throat  . A fast heartbeat  . A bad rash all over your body  . Dizziness and weakness    Immunizations Administered    Name Date Dose VIS Date Route   Pfizer COVID-19 Vaccine 04/11/2019 10:39 AM 0.3 mL 02/08/2019 Intramuscular   Manufacturer: [employer]   Lot: [id number]   [place]: [id number]

## 2019-04-17 ENCOUNTER — Other Ambulatory Visit: Payer: Self-pay

## 2019-04-17 ENCOUNTER — Encounter: Payer: Self-pay | Admitting: Family Medicine

## 2019-04-17 ENCOUNTER — Ambulatory Visit (INDEPENDENT_AMBULATORY_CARE_PROVIDER_SITE_OTHER): Payer: Medicare Other | Admitting: Family Medicine

## 2019-04-17 VITALS — BP 143/82 | HR 56 | Temp 97.6°F | Resp 17 | Ht 72.0 in | Wt 124.1 lb

## 2019-04-17 DIAGNOSIS — I10 Essential (primary) hypertension: Secondary | ICD-10-CM | POA: Diagnosis not present

## 2019-04-17 DIAGNOSIS — E440 Moderate protein-calorie malnutrition: Secondary | ICD-10-CM | POA: Diagnosis not present

## 2019-04-17 DIAGNOSIS — E785 Hyperlipidemia, unspecified: Secondary | ICD-10-CM | POA: Diagnosis not present

## 2019-04-17 LAB — BASIC METABOLIC PANEL
BUN: 14 mg/dL (ref 6–23)
CO2: 27 mEq/L (ref 19–32)
Calcium: 9.9 mg/dL (ref 8.4–10.5)
Chloride: 102 mEq/L (ref 96–112)
Creatinine, Ser: 0.68 mg/dL (ref 0.40–1.50)
GFR: 110.71 mL/min (ref >60.00)
Glucose, Bld: 80 mg/dL (ref 70–99)
Potassium: 4.7 mEq/L (ref 3.5–5.1)
Sodium: 140 mEq/L (ref 135–145)

## 2019-04-17 LAB — CBC WITH DIFFERENTIAL/PLATELET
Basophils Absolute: 0 10*3/uL (ref 0.0–0.1)
Basophils Relative: 0.2 % (ref 0.0–3.0)
Eosinophils Absolute: 0.1 10*3/uL (ref 0.0–0.7)
Eosinophils Relative: 0.8 % (ref 0.0–5.0)
HCT: 48.7 % (ref 39.0–52.0)
Hemoglobin: 16.1 g/dL (ref 13.0–17.0)
Lymphocytes Relative: 22 % (ref 12.0–46.0)
Lymphs Abs: 2.6 10*3/uL (ref 0.7–4.0)
MCHC: 33.1 g/dL (ref 30.0–36.0)
MCV: 94.1 fl (ref 78.0–100.0)
Monocytes Absolute: 0.9 10*3/uL (ref 0.1–1.0)
Monocytes Relative: 7.6 % (ref 3.0–12.0)
Neutro Abs: 8.1 10*3/uL — ABNORMAL HIGH (ref 1.4–7.7)
Neutrophils Relative %: 69.4 % (ref 43.0–77.0)
Platelets: 218 10*3/uL (ref 150.0–400.0)
RBC: 5.17 Mil/uL (ref 4.22–5.81)
RDW: 14.5 % (ref 11.5–15.5)
WBC: 11.7 10*3/uL — ABNORMAL HIGH (ref 4.0–10.5)

## 2019-04-17 LAB — HEPATIC FUNCTION PANEL
ALT: 19 U/L (ref 0–53)
AST: 18 U/L (ref 0–37)
Albumin: 4.3 g/dL (ref 3.5–5.2)
Alkaline Phosphatase: 71 U/L (ref 39–117)
Bilirubin, Direct: 0.2 mg/dL (ref 0.0–0.3)
Total Bilirubin: 0.9 mg/dL (ref 0.2–1.2)
Total Protein: 6.2 g/dL (ref 6.0–8.3)

## 2019-04-17 LAB — LIPID PANEL
Cholesterol: 115 mg/dL (ref 0–200)
HDL: 41.7 mg/dL (ref >39.00)
LDL Cholesterol: 60 mg/dL (ref 0–99)
NonHDL: 73.61
Total CHOL/HDL Ratio: 3
Triglycerides: 67 mg/dL (ref 0.0–149.0)
VLDL: 13.4 mg/dL (ref 0.0–40.0)

## 2019-04-17 LAB — TSH: TSH: 2.13 u[IU]/mL (ref 0.35–4.50)

## 2019-04-17 NOTE — Assessment & Plan Note (Signed)
Ongoing issue for pt.  He is still eating 3 meals/day and drinking Ensure.  Encouraged him to continue.  Will follow.

## 2019-04-17 NOTE — Patient Instructions (Signed)
Follow up in 6 months to recheck BP and cholesterol We'll notify you of your lab results and make any changes if needed Continue to eat regularly and drink your Ensure Call with any questions or concerns Stay Safe!  Stay Healthy!

## 2019-04-17 NOTE — Assessment & Plan Note (Signed)
Chronic problem.  Tolerating statin.  Asymptomatic.  Check labs.  Adjust meds prn

## 2019-04-17 NOTE — Assessment & Plan Note (Signed)
Chronic problem.  Adequate control today.  Asymptomatic.  Check labs.  Will follow. 

## 2019-04-17 NOTE — Progress Notes (Signed)
   Subjective:    Patient ID: [name], male    DOB: May 22, 1931, 86 y.o.   MRN: [id number]  HPI HTN- chronic problem, on Atenolol 25mg  daily.  No CP, SOB, HAs, visual changes.  Edema- pt states that he had swelling in both legs a few days ago but this resolved overnight w/ elevation.  Has not had swelling since.    Hyperlipidemia- chronic problem, on Lipitor 20mg  daily.  No abd pain, N/V.  Protein Calorie Malnutrition- pt continues to slowly lose weight.  Pt admits to poor appetite.  Will eat breakfast at 6am and lunch at 1pm.  Will eat dinner later in the day.  Continues to drink Ensure.  Has had both COVID vaccines  Review of Systems For ROS see HPI   This visit occurred during the SARS-CoV-2 public health emergency.  Safety protocols were in place, including screening questions prior to the visit, additional usage of staff PPE, and extensive cleaning of exam room while observing appropriate contact time as indicated for disinfecting solutions.       Objective:   Physical Exam Vitals reviewed.  Constitutional:      General: He is not in acute distress.    Appearance: He is well-developed.     Comments: Very thin, almost frail  HENT:     Head: Normocephalic and atraumatic.  Eyes:     Conjunctiva/sclera: Conjunctivae normal.     Pupils: Pupils are equal, round, and reactive to light.  Neck:     Thyroid: No thyromegaly.  Cardiovascular:     Rate and Rhythm: Normal rate and regular rhythm.     Heart sounds: Normal heart sounds. No murmur.  Pulmonary:     Effort: Pulmonary effort is normal. No respiratory distress.     Breath sounds: Normal breath sounds.  Abdominal:     General: Bowel sounds are normal. There is no distension.     Palpations: Abdomen is soft.  Musculoskeletal:     Cervical back: Normal range of motion and neck supple.  Lymphadenopathy:     Cervical: No cervical adenopathy.  Skin:    General: Skin is warm and dry.  Neurological:     Mental Status:  He is alert and oriented to person, place, and time.     Cranial Nerves: No cranial nerve deficit.  Psychiatric:        Behavior: Behavior normal.           Assessment & Plan:

## 2019-05-06 ENCOUNTER — Other Ambulatory Visit: Payer: Self-pay | Admitting: Family Medicine

## 2019-05-19 ENCOUNTER — Other Ambulatory Visit: Payer: Self-pay

## 2019-05-19 ENCOUNTER — Emergency Department (HOSPITAL_BASED_OUTPATIENT_CLINIC_OR_DEPARTMENT_OTHER)
Admission: EM | Admit: 2019-05-19 | Discharge: 2019-05-19 | Disposition: A | Discharge status: Home / Self Care | Payer: Medicare Other | Attending: Emergency Medicine | Admitting: Emergency Medicine

## 2019-05-19 ENCOUNTER — Emergency Department (HOSPITAL_BASED_OUTPATIENT_CLINIC_OR_DEPARTMENT_OTHER): Payer: Medicare Other

## 2019-05-19 ENCOUNTER — Encounter (HOSPITAL_BASED_OUTPATIENT_CLINIC_OR_DEPARTMENT_OTHER): Payer: Self-pay | Admitting: Emergency Medicine

## 2019-05-19 DIAGNOSIS — Z85828 Personal history of other malignant neoplasm of skin: Secondary | ICD-10-CM | POA: Diagnosis not present

## 2019-05-19 DIAGNOSIS — S51811A Laceration without foreign body of right forearm, initial encounter: Secondary | ICD-10-CM | POA: Diagnosis not present

## 2019-05-19 DIAGNOSIS — W19XXXA Unspecified fall, initial encounter: Secondary | ICD-10-CM

## 2019-05-19 DIAGNOSIS — S4991XA Unspecified injury of right shoulder and upper arm, initial encounter: Secondary | ICD-10-CM | POA: Diagnosis not present

## 2019-05-19 DIAGNOSIS — J449 Chronic obstructive pulmonary disease, unspecified: Secondary | ICD-10-CM | POA: Insufficient documentation

## 2019-05-19 DIAGNOSIS — Z8673 Personal history of transient ischemic attack (TIA), and cerebral infarction without residual deficits: Secondary | ICD-10-CM | POA: Diagnosis not present

## 2019-05-19 DIAGNOSIS — Y9301 Activity, walking, marching and hiking: Secondary | ICD-10-CM | POA: Diagnosis not present

## 2019-05-19 DIAGNOSIS — Z79899 Other long term (current) drug therapy: Secondary | ICD-10-CM | POA: Insufficient documentation

## 2019-05-19 DIAGNOSIS — Z87891 Personal history of nicotine dependence: Secondary | ICD-10-CM | POA: Diagnosis not present

## 2019-05-19 DIAGNOSIS — I10 Essential (primary) hypertension: Secondary | ICD-10-CM | POA: Diagnosis not present

## 2019-05-19 DIAGNOSIS — S59911A Unspecified injury of right forearm, initial encounter: Secondary | ICD-10-CM | POA: Diagnosis not present

## 2019-05-19 DIAGNOSIS — Z7982 Long term (current) use of aspirin: Secondary | ICD-10-CM | POA: Insufficient documentation

## 2019-05-19 DIAGNOSIS — Y92838 Other recreation area as the place of occurrence of the external cause: Secondary | ICD-10-CM | POA: Insufficient documentation

## 2019-05-19 DIAGNOSIS — W010XXA Fall on same level from slipping, tripping and stumbling without subsequent striking against object, initial encounter: Secondary | ICD-10-CM | POA: Diagnosis not present

## 2019-05-19 DIAGNOSIS — Y998 Other external cause status: Secondary | ICD-10-CM | POA: Diagnosis not present

## 2019-05-19 DIAGNOSIS — S46911A Strain of unspecified muscle, fascia and tendon at shoulder and upper arm level, right arm, initial encounter: Secondary | ICD-10-CM | POA: Insufficient documentation

## 2019-05-19 DIAGNOSIS — M25511 Pain in right shoulder: Secondary | ICD-10-CM | POA: Diagnosis not present

## 2019-05-19 DIAGNOSIS — Z23 Encounter for immunization: Secondary | ICD-10-CM | POA: Insufficient documentation

## 2019-05-19 DIAGNOSIS — R079 Chest pain, unspecified: Secondary | ICD-10-CM | POA: Diagnosis not present

## 2019-05-19 MED ORDER — TETANUS-DIPHTH-ACELL PERTUSSIS 5-2.5-18.5 LF-MCG/0.5 IM SUSP
0.5000 mL | Freq: Once | INTRAMUSCULAR | Status: AC
  Administered 2019-05-19: 0.5 mL via INTRAMUSCULAR
  Filled 2019-05-19: qty 0.5

## 2019-05-19 NOTE — ED Triage Notes (Signed)
Pt in with c/o trip and fall about 30 mins PTA. Pt presents with skin tear to left forearm and hand. Pt c/o right shoulder pain. Pt did not have + LOC.

## 2019-05-19 NOTE — Discharge Instructions (Signed)
Follow wound care instructions as discussed.  Recommend follow-up with primary doctor for wound recheck in 1 week.  Return to ER if you develop worsening bleeding, redness, purulent drainage or other new concerning symptom.

## 2019-05-19 NOTE — ED Provider Notes (Addendum)
[facility] Provider Note   CSN: [id number] Arrival date & time: 05/19/19  0946     History Chief Complaint  Patient presents with  . Fall    [name] is a 86 y.o. male.  Presents to ER after a fall.  Patient states he was [name], [name], [name], [name], also having some Joshua shoulder pain.  Denies any head trauma, no loss of consciousness.  Does not have any active pain at rest.  No numbness, weakness.  Bleeding stopped with direct pressure.  Not on anticoagulation.  HPI     Past Medical History:  Diagnosis Date  . Arthritis    hands   . BPH (benign prostatic hyperplasia)   . COPD (chronic obstructive pulmonary disease) ([place])   . Coronary artery disease   . CVA (cerebral infarction) 3 years ago   mild stroke  . HLD (hyperlipidemia)   . Hypertension   . Osteoporosis   . Pneumonia   . 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 SCC (squamous cell carcinoma) in situ 12/12/2017   left cheek inf.  . SCC (squamous cell carcinoma) in situ x 2    [facility] front scalp, left cheek    Patient Active Problem List   Diagnosis Date Noted  . Protein-calorie malnutrition ([place]) 09/26/2018  . Urinary retention 05/30/2018  . Fall   . Syncope 05/26/2018  . Thyroid nodule 04/12/2018  . Screening for malignant neoplasm of prostate 10/08/2014  . Facial skin lesion 04/08/2014  . HTN (hypertension) 10/15/2013  . Osteoporosis 10/15/2013  . History of CVA (cerebrovascular accident) 10/15/2013  . History of compression fracture of spine 10/15/2013  . Hyperlipidemia 10/15/2013  . Low testosterone 10/15/2013  . Carotid stenosis 10/15/2013    Past Surgical History:  Procedure Laterality Date  . BACK SURGERY    . LAPAROSCOPIC APPENDECTOMY N/A 11/02/2013   Procedure: APPENDECTOMY LAPAROSCOPIC;  Surgeon: Donnie Mesa, MD;  Location: [facility] OR;  Service:  General;  Laterality: N/A;       Family History  Problem Relation Age of Onset  . Colon cancer Brother   . Heart disease Brother   . Cancer Father   . Colon cancer Brother   . Aneurysm Sister     Social History   Tobacco Use  . Smoking status: Former Smoker    Packs/day: 2.00    Years: 20.00    Pack years: 40.00    Types: Cigarettes    Quit date: 02/29/1988    Years since quitting: 31.2  . Smokeless tobacco: Never Used  Substance Use Topics  . Alcohol use: No    Alcohol/week: 0.0 standard drinks  . Drug use: No    Home Medications Prior to Admission medications   Medication Sig Start Date End Date Taking? Authorizing Provider  acetaminophen (TYLENOL) 500 MG tablet Take 500-1,000 mg by mouth every 8 (eight) hours as needed for mild pain or headache.     [provider]  alendronate (FOSAMAX) 70 MG tablet Take 1 tablet (70 mg total) by mouth every Friday. take with a full glass of water on an empty stomach 08/17/18   Renato Shin, MD  aspirin EC 81 MG tablet Take 81 mg by mouth daily.    [provider]  atenolol (TENORMIN) 25 MG tablet TAKE ONE TABLET BY MOUTH DAILY 12/06/18   [name], MD  atorvastatin (LIPITOR) 20 MG tablet Take 1 tablet (20 mg total) by mouth daily. 12/06/18   [name], MD  calcium-vitamin D (OSCAL WITH D) 500-200 MG-UNIT tablet Take 1 tablet by mouth 2 (two) times daily.    [provider]  Cholecalciferol (VITAMIN D-3) 1000 units CAPS Take 1,000 Units by mouth 2 (two) times a day.     [provider]  denosumab (PROLIA) 60 MG/ML SOSY injection Inject 60 mg into the skin every 6 (six) months.    [provider]  feeding supplement, ENSURE ENLIVE, (ENSURE ENLIVE) LIQD Take 237 mLs by mouth 3 (three) times daily between meals. Patient taking differently: Take 237 mLs by mouth 2 (two) times daily between meals.  05/31/18   Black, Lezlie Octave, NP  fluticasone (FLOVENT HFA) 110 MCG/ACT inhaler USE 2  INHALATIONS TWICE A DAY Patient taking differently: Inhale 2 puffs into the lungs 2 (two) times a day.  08/15/18   [name], MD  ipratropium (ATROVENT HFA) 17 MCG/ACT inhaler Inhale 2 puffs into the lungs every 6 (six) hours as needed for wheezing. 10/16/17   [name], MD  lidocaine (LIDODERM) 5 % Place 1 patch onto the skin daily. Remove & Discard patch within 12 hours or as directed by MD 06/01/18   Radene Gunning, NP  Multiple Vitamin (MULTIVITAMIN WITH MINERALS) TABS tablet Take 1 tablet by mouth daily. 06/01/18   Black, Lezlie Octave, NP  Polyethyl Glycol-Propyl Glycol (SYSTANE) 0.4-0.3 % SOLN Place 1 drop into both eyes 3 (three) times daily as needed (dry eyes).     [provider]  tamsulosin (FLOMAX) 0.4 MG CAPS capsule TAKE ONE CAPSULE BY MOUTH DAILY 05/06/19   [name], MD  traMADol (ULTRAM) 50 MG tablet Take 50 mg by mouth every 6 (six) hours as needed for moderate pain.     [provider]    Allergies    Patient has no known allergies.  Review of Systems   Review of Systems  Constitutional: Negative for chills and fever.  HENT: Negative for ear pain and sore throat.   Eyes: Negative for pain and visual disturbance.  Respiratory: Negative for cough and shortness of breath.   Cardiovascular: Negative for chest pain and palpitations.  Gastrointestinal: Negative for abdominal pain and vomiting.  Genitourinary: Negative for dysuria and hematuria.  Musculoskeletal: Negative for arthralgias and back pain.  Skin: Positive for color change. Negative for rash.  Neurological: Negative for seizures and syncope.  All other systems reviewed and are negative.   Physical Exam Updated Vital Signs BP (!) 173/92 (BP Location: Left Arm)   Pulse 69   Temp 97.6 F (36.4 C) (Oral)   Resp 16   Ht 5\' 7"  (1.702 m)   Wt 57.2 kg   SpO2 95%   BMI 19.73 kg/m   Physical Exam Vitals and nursing note reviewed.  Constitutional:      Appearance: He is  well-developed.  HENT:     Head: Normocephalic and atraumatic.  Eyes:     Conjunctiva/sclera: Conjunctivae normal.  Cardiovascular:     Rate and Rhythm: Normal rate and regular rhythm.     Heart sounds: No murmur.  Pulmonary:     Effort: Pulmonary effort is normal. No respiratory distress.     Breath sounds: Normal breath sounds.  Abdominal:     Palpations: Abdomen is soft.     Tenderness: There is no abdominal tenderness.  Musculoskeletal:     Cervical back:  Neck supple.     Comments: Joshua upper extremity: Superficial skin tear over [name] approximately 8 cm long, no deeper tissue involved, some tenderness over shoulder, no tenderness throughout remainder of extremity, normal joint ROM throughout, normal radial pulse, distal sensation and cap refill intact LUE: no TTP throughout RLE: no TTP throughout LLE: no TTP throughout Back: no C, T, L spine TTP  Skin:    General: Skin is warm and dry.  Neurological:     General: No focal deficit present.     Mental Status: He is alert and oriented to person, place, and time.     ED Results / Procedures / Treatments   Labs (all labs ordered are listed, but only abnormal results are displayed) Labs Reviewed - No data to display  EKG None  Radiology DG Chest 2 View  Result Date: 05/19/2019 CLINICAL DATA:  Fall coming out of the [employer] this am, concrete, skin tear rt wrist, but has full range of motion, rt distil clavicle pains, rt shoulder pains above AC joint, painful to move, limited motion, cannot raise rt arm, no chest pains EXAM: CHEST - 2 VIEW COMPARISON:  Chest radiograph 09/18/2018 FINDINGS: Stable cardiomediastinal contours. Aortic arch calcification. There are diffuse bilateral coarse interstitial opacities similar to the prior study. No new focal consolidation. No pneumothorax or pleural effusion. Diffuse osteopenia. Numerous wedge compression deformities throughout the thoracic and upper lumbar spine status post  kyphoplasty at several levels, similar in appearance to prior. Old Joshua-sided rib fractures. IMPRESSION: No definite acute finding. Chronic interstitial lung disease. Numerous wedge compression deformities throughout the thoracic and lumbar spine with similar appearance to prior. Electronically Signed   By: Audie Pinto M.D.   On: 05/19/2019 11:48   DG Shoulder Joshua  Result Date: 05/19/2019 CLINICAL DATA:  Fall coming out of the [employer] this am, concrete, skin tear rt wrist, but has full range of motion, rt distil clavicle pains, rt shoulder pains above AC joint, painful to move, limited motion, cannot raise rt arm, no chest pains EXAM: [facility] SHOULDER - 2+ VIEW COMPARISON:  None. FINDINGS: Osteopenia. No evidence of fracture or dislocation. Mild degenerative changes at the [facility] joint. Vascular calcifications in the regional soft tissues. Joshua-sided rib fractures appear chronic. IMPRESSION: 1.  No acute osseous abnormality in the Joshua shoulder. 2.  Joshua-sided rib fractures are favored to be chronic. Electronically Signed   By: Audie Pinto M.D.   On: 05/19/2019 11:50   DG [name] Joshua  Result Date: 05/19/2019 CLINICAL DATA:  Fall coming out of the [employer] this am, concrete, skin tear rt wrist, but has full range of motion, rt distil clavicle pains, rt shoulder pains above AC joint, painful to move, limited motion, cannot raise rt arm, no chest pains EXAM: [name] - 2 VIEW COMPARISON:  None. FINDINGS: Osteopenia. There is no evidence of fracture or other focal bone lesions. Vascular calcifications in the regional soft tissues. IMPRESSION: No acute osseous abnormality in the [name]. Electronically Signed   By: Audie Pinto M.D.   On: 05/19/2019 11:52    Procedures .Marland KitchenLaceration Repair  Date/Time: 05/19/2019 3:05 PM Performed by: Lucrezia Starch, MD Authorized by: Lucrezia Starch, MD   Consent:    Consent obtained:  Verbal   Consent given by:  Patient    Risks discussed:  Infection, need for additional repair, poor wound healing, poor cosmetic result, pain, retained foreign body, tendon damage, vascular damage and nerve damage   Alternatives  discussed:  No treatment, delayed treatment and referral Anesthesia (see MAR for exact dosages):    Anesthesia method:  None Laceration details:    Location:  Shoulder/arm   Shoulder/arm location:  R upper arm   Length (cm):  8 Repair type:    Repair type:  Simple Exploration:    Wound exploration: wound explored through full range of motion     Contaminated: no   Treatment:    Area cleansed with:  Betadine and saline   Amount of cleaning:  Extensive   Irrigation solution:  Sterile saline   Irrigation volume:  500 ml   Irrigation method:  Syringe   Visualized foreign bodies/material removed: no   Skin repair:    Repair method:  Steri-Strips   Number of Steri-Strips:  6 Approximation:    Approximation:  Close Post-procedure details:    Dressing: xeroform gauze.   Patient tolerance of procedure:  Tolerated well, no immediate complications Comments:     Friable skin, very superficial skin tear; irrigated with copious sterile saline, cleansed with dilute betadine, approximated with steri strips, applied xeroform gauze dresings and lightly wrapped in dry gauze.   (including critical care time)  Medications Ordered in ED Medications  Tdap (BOOSTRIX) injection 0.5 mL (0.5 mLs Intramuscular Given 05/19/19 1032)    ED Course  I have reviewed the triage vital signs and the nursing notes.  Pertinent labs & imaging results that were available during my care of the patient were reviewed by me and considered in my medical decision making (see chart for details).    MDM Rules/Calculators/A&P                      86 year old male with [name]-level fall, trauma to the [name]. Physical exam notable for superficial skin tears over [name], some tenderness over his Joshua shoulder.  X-rays  negative.  No head trauma, no LOC.  The [name] injury was very superficial skin tears, skin very delicate, performed extensive wound irrigation, approximated using Steri-Strips.  Reviewed wound care with patient and son at bedside.  Recommend wound recheck with PCP this coming week.  Discharged home.    After the discussed management above, the patient was determined to be safe for discharge.  The patient was in agreement with this plan and all questions regarding their care were answered.  ED return precautions were discussed and the patient will return to the ED with any significant worsening of condition.   Final Clinical Impression(s) / ED Diagnoses Final diagnoses:  Fall, initial encounter  Skin tear of [name] without complication, initial encounter  Strain of Joshua shoulder, initial encounter    Rx / DC Orders ED Discharge Orders    None       Lucrezia Starch, MD 05/19/19 1505    Lucrezia Starch, MD 05/19/19 [phone]

## 2019-05-23 ENCOUNTER — Encounter: Payer: Self-pay | Admitting: Family Medicine

## 2019-05-23 ENCOUNTER — Ambulatory Visit (INDEPENDENT_AMBULATORY_CARE_PROVIDER_SITE_OTHER): Payer: Medicare Other | Admitting: Family Medicine

## 2019-05-23 ENCOUNTER — Other Ambulatory Visit: Payer: Self-pay

## 2019-05-23 VITALS — BP 134/82 | HR 35 | Temp 97.9°F | Resp 16

## 2019-05-23 DIAGNOSIS — M25511 Pain in right shoulder: Secondary | ICD-10-CM | POA: Diagnosis not present

## 2019-05-23 DIAGNOSIS — S51811D Laceration without foreign body of right forearm, subsequent encounter: Secondary | ICD-10-CM | POA: Diagnosis not present

## 2019-05-23 MED ORDER — CEPHALEXIN 500 MG PO CAPS: 500.0000 mg | ORAL_CAPSULE | Freq: Three times a day (TID) | ORAL | 0 refills | Status: AC

## 2019-05-23 NOTE — Progress Notes (Signed)
   Subjective:    Patient ID: [name], male    DOB: 1931/11/17, 86 y.o.   MRN: [id number]  HPI Skin tear- pt tripped over the curb and fell on 3/21 and has a large skin tear of R forearm.  Is here today for wound f/u.  Has R shoulder pain as well.  Pt reports pain is improving.  Tetanus updated at time of fall.  Family member has been covering w/ xeroform and then wrapping area.  ER notes and imaging reviewed  Review of Systems For ROS see HPI   This visit occurred during the SARS-CoV-2 public health emergency.  Safety protocols were in place, including screening questions prior to the visit, additional usage of staff PPE, and extensive cleaning of exam room while observing appropriate contact time as indicated for disinfecting solutions.       Objective:   Physical Exam Vitals reviewed.  Constitutional:      Comments: Very frail appearing  HENT:     Head: Normocephalic and atraumatic.  Musculoskeletal:     Comments: TTP over R scapula.  Good forward flexion, pain w/ abduction or internal rotation of R shoulder  Skin:    Findings: Bruising (extensive bruising of R forearm) present.     Comments: Jagged skin tears from R 4th MCP joint across dorsum of R hand and up R forearm.  Some steri strips in place but serving little purpose.  Wound cleaned w/ H2O2 and inspected.  Wound bases are clean.  Redressed in office           Assessment & Plan:  Skin tear- new to provider.  No current evidence of infxn but given the extensive distribution, will cover w/ Keflex TID.  Reviewed wound care w/ pt and family member.  He will return in 10 days to re-assess.  Pt expressed understanding and is in agreement w/ plan.   Shoulder pain- new.  No obvious bony deformity.  Xray reviewed.  Pt feels pain is improving and is not interested in orthopedic referral at this time.  Will follow.

## 2019-05-23 NOTE — Patient Instructions (Signed)
Follow up April 5 or 6 to recheck wound Keep area clean and dry Take the Keflex 3x/day w/ meals Call with any questions or concerns Hang in there!!

## 2019-05-29 ENCOUNTER — Telehealth: Payer: Self-pay | Admitting: Family Medicine

## 2019-05-29 NOTE — Progress Notes (Signed)
  Chronic Care Management   Outreach Note  05/29/2019 Name: [name] MRN: [id number] DOB: 09-12-1931  Referred by: [name], MD Reason for referral : No chief complaint on file.   An unsuccessful telephone outreach was attempted today. The patient was referred to the pharmacist for assistance with care management and care coordination.   Follow Up Plan:   Earney Hamburg Upstream Scheduler

## 2019-06-04 ENCOUNTER — Other Ambulatory Visit: Payer: Self-pay

## 2019-06-04 ENCOUNTER — Telehealth: Payer: Medicare Other | Admitting: Family Medicine

## 2019-06-04 ENCOUNTER — Telehealth: Payer: Self-pay | Admitting: General Practice

## 2019-06-04 NOTE — Telephone Encounter (Signed)
Pt appt was chagned.

## 2019-06-04 NOTE — Telephone Encounter (Signed)
Called and [facility] to ask if pt would be able to come into the office on Wednesday at 3:30pm for an in-person wound check.   Appt time blocked.

## 2019-06-05 ENCOUNTER — Ambulatory Visit (INDEPENDENT_AMBULATORY_CARE_PROVIDER_SITE_OTHER): Payer: Medicare Other | Admitting: Family Medicine

## 2019-06-05 ENCOUNTER — Encounter: Payer: Self-pay | Admitting: Family Medicine

## 2019-06-05 ENCOUNTER — Ambulatory Visit: Payer: Medicare Other | Admitting: Family Medicine

## 2019-06-05 VITALS — BP 121/81 | HR 70 | Temp 97.8°F | Resp 16 | Wt 125.5 lb

## 2019-06-05 DIAGNOSIS — E440 Moderate protein-calorie malnutrition: Secondary | ICD-10-CM | POA: Diagnosis not present

## 2019-06-05 DIAGNOSIS — M25511 Pain in right shoulder: Secondary | ICD-10-CM | POA: Diagnosis not present

## 2019-06-05 DIAGNOSIS — S51811D Laceration without foreign body of right forearm, subsequent encounter: Secondary | ICD-10-CM | POA: Diagnosis not present

## 2019-06-05 NOTE — Assessment & Plan Note (Signed)
Ongoing issue for pt.  Again stressed need for eating regularly and often.  Continue Ensure regularly.  Will follow.

## 2019-06-05 NOTE — Progress Notes (Signed)
   Subjective:    Patient ID: [name], male    DOB: 1931-03-08, 86 y.o.   MRN: [id number]  HPI R arm wound- pt reports this is feeling much better.  No signs of infxn  R shoulder pain- resolved.  Pt is able to move normally and again ambulate w/ his cane  Protein Calorie Malnutrition- family reports that he is eating but very small amounts   Review of Systems For ROS see HPI   This visit occurred during the SARS-CoV-2 public health emergency.  Safety protocols were in place, including screening questions prior to the visit, additional usage of staff PPE, and extensive cleaning of exam room while observing appropriate contact time as indicated for disinfecting solutions.     Objective:   Physical Exam Vitals reviewed.  Constitutional:      General: He is not in acute distress.    Comments: Cachetic appearing  HENT:     Head: Normocephalic and atraumatic.  Musculoskeletal:        General: No swelling or tenderness. Normal range of motion.     Comments: Full ROM of R shoulder  Skin:    General: Skin is warm and dry.     Comments: R arm wound is scabbed and well healing  Neurological:     General: No focal deficit present.     Mental Status: He is alert and oriented to person, place, and time.  Psychiatric:        Mood and Affect: Mood normal.        Behavior: Behavior normal.        Thought Content: Thought content normal.           Assessment & Plan:  Skin tear- much improved.  Area is well healed and scabbed.  No further wound care needed.  Pt expressed understanding and is in agreement w/ plan.   R shoulder pain- resolved.  Pt now has full ROM and is able to bear weight on that arm using his cane.

## 2019-06-05 NOTE — Patient Instructions (Signed)
Follow up as needed or as scheduled Keep up the good work- the wound looks great! Make sure you are eating regularly throughout the day- like grazing! Continue the Ensure Call with any questions or concerns Hang in there!

## 2019-06-06 ENCOUNTER — Other Ambulatory Visit: Payer: Self-pay | Admitting: Family Medicine

## 2019-06-06 ENCOUNTER — Telehealth: Payer: Self-pay | Admitting: Family Medicine

## 2019-06-06 NOTE — Progress Notes (Signed)
  Chronic Care Management   Note  06/06/2019 Name: [name] MRN: [id number] DOB: 20-Mar-1931  [name] is a 86 y.o. year old male who is a primary care patient of [name], Aundra Millet, MD. I reached out to [employer] by phone today in response to a referral sent by [name] PCP, [name], MD.   [name] was given information about Chronic Care Management services today including:  1. CCM service includes personalized support from designated clinical staff supervised by his physician, including individualized plan of care and coordination with other care providers 2. 24/7 contact phone numbers for assistance for urgent and routine care needs. 3. Service will only be billed when office clinical staff spend 20 minutes or more in a month to coordinate care. 4. Only one practitioner may furnish and bill the service in a calendar month. 5. The patient may stop CCM services at any time (effective at the end of the month) by phone call to the office staff.   Patient agreed to services and verbal consent obtained.   Follow up plan:   Earney Hamburg Upstream Scheduler

## 2019-06-18 ENCOUNTER — Other Ambulatory Visit: Payer: Self-pay | Admitting: General Practice

## 2019-06-18 DIAGNOSIS — I10 Essential (primary) hypertension: Secondary | ICD-10-CM

## 2019-06-18 DIAGNOSIS — J449 Chronic obstructive pulmonary disease, unspecified: Secondary | ICD-10-CM

## 2019-06-18 DIAGNOSIS — E785 Hyperlipidemia, unspecified: Secondary | ICD-10-CM

## 2019-06-25 NOTE — Patient Instructions (Addendum)
Visit Information  Goals Addressed            This Visit's Progress   . BP <130/80       CARE PLAN ENTRY (see longitudinal plan of care for additional care plan information)  Current Barriers:  . Current antihypertensive regimen: atenolol 25 mg daily  . Last practice recorded BP readings:  BP Readings from Last 3 Encounters:  06/05/19 121/81  05/23/19 134/82  05/19/19 (!) 173/92 .  Current home BP readings: n/a at this time.   Pharmacist Clinical Goal(s):  [name] Over the next 90 days, patient will work with PharmD and providers to optimize antihypertensive regimen.  o Focus on preventing dizziness caused from low blood pressure.   Interventions: . Inter-disciplinary care team collaboration (see longitudinal plan of care) . Comprehensive medication review performed; medication list updated in the electronic medical record.  . BP Monitoring plan  Patient Self Care Activities:  . Patient will purchase BP cuff and check BP at various times throughout the day every day. Document in log, and provide at future appointments. . Patient will focus on medication adherence by continuing current medication management.   Initial goal documentation.    [name] COPD: Minimize recurring symptoms       CARE PLAN ENTRY  Current Barriers:  . Chronic Disease Management support, education, and care coordination needs related to  COPD.   . Currently managed with:  Flovent 110 mcg/act 2 puffs twice daily.  Atrovent 2 puffs every 6 hours as needed for wheezing.  Pharmacist Clinical Goal(s):  [name] Over next 90 days, minimize recurring symptoms of COPD.   Interventions: . Comprehensive medication review performed.  Patient Self Care Activities:  . Patient verbalizes understanding of plan to continue with current medication management.  Initial goal documentation    . LDL <70       CARE PLAN ENTRY (see longitudinal plan of care for additional care plan information)  Current Barriers:    . Current antihyperlipidemic regimen: atorvastatin 40 mg daily  . Most recent lipid panel:     Component Value Date/Time   CHOL 115 04/17/2019 1200   TRIG 67.0 04/17/2019 1200   HDL 41.70 04/17/2019 1200   CHOLHDL 3 04/17/2019 1200   VLDL 13.4 04/17/2019 1200   LDLCALC 60 04/17/2019 1200 .  ASCVD risk enhancing conditions: age >29, HTN, history of stroke  Pharmacist Clinical Goal(s):  [name] Over the next 90 days, patient will work with PharmD and providers towards optimized antihyperlipidemic therapy  Interventions: . Comprehensive medication review performed; medication list updated in electronic medical record.  [name] care team collaboration (see longitudinal plan of care)  Patient Self Care Activities:  . Patient will focus on medication adherence by continuing current medication management.   Initial goal documentation    . Osteoporosis: Minimize fracture risk       CARE PLAN ENTRY  Current Barriers:  . Chronic Disease Management support, education, and care coordination needs related to osteoporosis.  Pharmacist Clinical Goal(s):  [name] Minimize fall risk - see hypertension plan.  Interventions: . Comprehensive medication review performed. . Plan to monitor BP daily over next 90 days.  Patient Self Care Activities:  . Patient verbalizes understanding of plan to continue current medications for osteoporosis and monitor BP daily.  Initial goal documentation        [name] was given information about Chronic Care Management services today including:  1. CCM service includes personalized support from designated clinical staff supervised by his  physician, including individualized plan of care and coordination with other care providers 2. 24/7 contact phone numbers for assistance for urgent and routine care needs. 3. Standard insurance, coinsurance, copays and deductibles apply for chronic care management only during months in which we provide at least 20  minutes of these services. Most insurances cover these services at 100%, however patients may be responsible for any copay, coinsurance and/or deductible if applicable. This service may help you avoid the need for more expensive face-to-face services. 4. Only one practitioner may furnish and bill the service in a calendar month. 5. The patient may stop CCM services at any time (effective at the end of the month) by phone call to the office staff.  Patient agreed to services and verbal consent obtained.   The patient verbalized understanding of instructions provided today and agreed to receive a mailed copy of patient instruction and/or educational materials. Telephone follow up appointment with pharmacy team member scheduled for: See next appointment with "Care Management Staff" under "What's Next" below.   Thank you!  Madelin Rear, Pharm.D. Clinical Pharmacist [facility] [phone]  Hypertension, Adult High blood pressure (hypertension) is when the force of blood pumping through the arteries is too strong. The arteries are the blood vessels that carry blood from the heart throughout the body. Hypertension forces the heart to work harder to pump blood and may cause arteries to become narrow or stiff. Untreated or uncontrolled hypertension can cause a heart attack, heart failure, a stroke, kidney disease, and other problems. A blood pressure reading consists of a higher number over a lower number. Ideally, your blood pressure should be below 120/80. The first ("top") number is called the systolic pressure. It is a measure of the pressure in your arteries as your heart beats. The second ("bottom") number is called the diastolic pressure. It is a measure of the pressure in your arteries as the heart relaxes. What are the causes? The exact cause of this condition is not known. There are some conditions that result in or are related to high blood pressure. What  increases the risk? Some risk factors for high blood pressure are under your control. The following factors may make you more likely to develop this condition:  Smoking.  Having type 2 diabetes mellitus, high cholesterol, or both.  Not getting enough exercise or physical activity.  Being overweight.  Having too much fat, sugar, calories, or salt (sodium) in your diet.  Drinking too much alcohol. Some risk factors for high blood pressure may be difficult or impossible to change. Some of these factors include:  Having chronic kidney disease.  Having a family history of high blood pressure.  Age. Risk increases with age.  Race. You may be at higher risk if you are African American.  Gender. Men are at higher risk than women before age 77. After age 24, women are at higher risk than men.  Having obstructive sleep apnea.  Stress. What are the signs or symptoms? High blood pressure may not cause symptoms. Very high blood pressure (hypertensive crisis) may cause:  Headache.  Anxiety.  Shortness of breath.  Nosebleed.  Nausea and vomiting.  Vision changes.  Severe chest pain.  Seizures. How is this diagnosed? This condition is diagnosed by measuring your blood pressure while you are seated, with your arm resting on a flat surface, your legs uncrossed, and your feet flat on the floor. The cuff of the blood pressure monitor will be placed directly  against the skin of your upper arm at the level of your heart. It should be measured at least twice using the same arm. Certain conditions can cause a difference in blood pressure between your right and left arms. Certain factors can cause blood pressure readings to be lower or higher than normal for a short period of time:  When your blood pressure is higher when you are in a health care provider's office than when you are at home, this is called white coat hypertension. Most people with this condition do not need  medicines.  When your blood pressure is higher at home than when you are in a health care provider's office, this is called masked hypertension. Most people with this condition may need medicines to control blood pressure. If you have a high blood pressure reading during one visit or you have normal blood pressure with other risk factors, you may be asked to:  Return on a different day to have your blood pressure checked again.  Monitor your blood pressure at home for 1 week or longer. If you are diagnosed with hypertension, you may have other blood or imaging tests to help your health care provider understand your overall risk for other conditions. How is this treated? This condition is treated by making healthy lifestyle changes, such as eating healthy foods, exercising more, and reducing your alcohol intake. Your health care provider may prescribe medicine if lifestyle changes are not enough to get your blood pressure under control, and if:  Your systolic blood pressure is above 130.  Your diastolic blood pressure is above 80. Your personal target blood pressure may vary depending on your medical conditions, your age, and other factors. Follow these instructions at home: Eating and drinking   Eat a diet that is high in fiber and potassium, and low in sodium, added sugar, and fat. An example eating plan is called the DASH (Dietary Approaches to Stop Hypertension) diet. To eat this way: ? Eat plenty of fresh fruits and vegetables. Try to fill one half of your plate at each meal with fruits and vegetables. ? Eat whole grains, such as whole-wheat pasta, brown rice, or whole-grain bread. Fill about one fourth of your plate with whole grains. ? Eat or drink low-fat dairy products, such as skim milk or low-fat yogurt. ? Avoid fatty cuts of meat, processed or cured meats, and poultry with skin. Fill about one fourth of your plate with lean proteins, such as fish, chicken without skin, beans, eggs,  or tofu. ? Avoid pre-made and processed foods. These tend to be higher in sodium, added sugar, and fat.  Reduce your daily sodium intake. Most people with hypertension should eat less than 1,500 mg of sodium a day.  Do not drink alcohol if: ? Your health care provider tells you not to drink. ? You are pregnant, may be pregnant, or are planning to become pregnant.  If you drink alcohol: ? Limit how much you use to:  0-1 drink a day for women.  0-2 drinks a day for men. ? Be aware of how much alcohol is in your drink. In the [place] one drink equals one 12 oz bottle of beer (355 mL), one 5 oz glass of wine (148 mL), or one 1 oz glass of hard liquor (44 mL). Lifestyle   Work with your health care provider to maintain a healthy body weight or to lose weight. Ask what an ideal weight is for you.  Get at least 30 minutes  of exercise most days of the week. Activities may include walking, swimming, or biking.  Include exercise to strengthen your muscles (resistance exercise), such as Pilates or lifting weights, as part of your weekly exercise routine. Try to do these types of exercises for 30 minutes at least 3 days a week.  Do not use any products that contain nicotine or tobacco, such as cigarettes, e-cigarettes, and chewing tobacco. If you need help quitting, ask your health care provider.  Monitor your blood pressure at home as told by your health care provider.  Keep all follow-up visits as told by your health care provider. This is important. Medicines  Take over-the-counter and prescription medicines only as told by your health care provider. Follow directions carefully. Blood pressure medicines must be taken as prescribed.  Do not skip doses of blood pressure medicine. Doing this puts you at risk for problems and can make the medicine less effective.  Ask your health care provider about side effects or reactions to medicines that you should watch for. Contact a health care  provider if you:  Think you are having a reaction to a medicine you are taking.  Have headaches that keep coming back (recurring).  Feel dizzy.  Have swelling in your ankles.  Have trouble with your vision. Get help right away if you:  Develop a severe headache or confusion.  Have unusual weakness or numbness.  Feel faint.  Have severe pain in your chest or abdomen.  Vomit repeatedly.  Have trouble breathing. Summary  Hypertension is when the force of blood pumping through your arteries is too strong. If this condition is not controlled, it may put you at risk for serious complications.  Your personal target blood pressure may vary depending on your medical conditions, your age, and other factors. For most people, a normal blood pressure is less than 120/80.  Hypertension is treated with lifestyle changes, medicines, or a combination of both. Lifestyle changes include losing weight, eating a healthy, low-sodium diet, exercising more, and limiting alcohol. This information is not intended to replace advice given to you by your health care provider. Make sure you discuss any questions you have with your health care provider. Document Revised: 10/25/2017 Document Reviewed: 10/25/2017 Elsevier Patient Education  2020 [employer].

## 2019-06-25 NOTE — Progress Notes (Signed)
Chronic Care Management Pharmacy  Name: [name]  MRN: [id number] DOB: [birth year]  Chief Complaint/ HPI  [name],  86 y.o. , male presents for their Initial CCM visit with the clinical pharmacist via telephone due to COVID-19 Pandemic.  PCP : [name], MD  Their chronic conditions include: HTN, HLD, osteoporosis, COPD, CVA, BPH.  Office Visits:  4/7 (PCP): f/u on forearm skin tear, area healed. No further care needed. Continue Ensure for protein malnutrition.  Consult Visit:  3/21 (ED): skin tear as mentioned above, fell coming out of [employer].   Medications: Outpatient Encounter Medications as of 06/26/2019  Medication Sig Note  . alendronate (FOSAMAX) 70 MG tablet Take 1 tablet (70 mg total) by mouth every Friday. take with a full glass of water on an empty stomach   . aspirin EC 81 MG tablet Take 81 mg by mouth daily.   [name] atenolol (TENORMIN) 25 MG tablet TAKE ONE TABLET BY MOUTH DAILY   . atorvastatin (LIPITOR) 20 MG tablet TAKE 1 TABLET (20 MG TOTAL) BY MOUTH DAILY.   . calcium-vitamin D (OSCAL WITH D) 500-200 MG-UNIT tablet Take 1 tablet by mouth 2 (two) times daily.   . Cholecalciferol (VITAMIN D-3) 1000 units CAPS Take 1,000 Units by mouth 2 (two) times a day.    . denosumab (PROLIA) 60 MG/ML SOSY injection Inject 60 mg into the skin every 6 (six) months. 05/27/2018: Next injection due May or June  . feeding supplement, ENSURE ENLIVE, (ENSURE ENLIVE) LIQD Take 237 mLs by mouth 3 (three) times daily between meals. (Patient taking differently: Take 237 mLs by mouth 2 (two) times daily between meals. )   . fluticasone (FLOVENT HFA) 110 MCG/ACT inhaler USE 2 INHALATIONS TWICE A DAY (Patient taking differently: Inhale 2 puffs into the lungs 2 (two) times a day. )   . ipratropium (ATROVENT HFA) 17 MCG/ACT inhaler Inhale 2 puffs into the lungs every 6 (six) hours as needed for wheezing.   . Multiple Vitamin (MULTIVITAMIN WITH MINERALS) TABS tablet Take 1  tablet by mouth daily.   [name]-Propyl Glycol (SYSTANE) 0.4-0.3 % SOLN Place 1 drop into both eyes 3 (three) times daily as needed (dry eyes).    . tamsulosin (FLOMAX) 0.4 MG CAPS capsule TAKE ONE CAPSULE BY MOUTH DAILY   . acetaminophen (TYLENOL) 500 MG tablet Take 500-1,000 mg by mouth every 8 (eight) hours as needed for mild pain or headache.    . lidocaine (LIDODERM) 5 % Place 1 patch onto the skin daily. Remove & Discard patch within 12 hours or as directed by MD (Patient not taking: Reported on 06/26/2019)   . traMADol (ULTRAM) 50 MG tablet Take 50 mg by mouth every 6 (six) hours as needed for moderate pain.     No facility-administered encounter medications on file as of 06/26/2019.   Current Diagnosis/Assessment: Goals Addressed            This Visit's Progress   . BP <130/80       CARE PLAN ENTRY (see longitudinal plan of care for additional care plan information)  Current Barriers:  . Current antihypertensive regimen: atenolol 25 mg daily  . Last practice recorded BP readings:  BP Readings from Last 3 Encounters:  06/05/19 121/81  05/23/19 134/82  05/19/19 (!) 173/92 .  Current home BP readings: n/a at this time.   Pharmacist Clinical Goal(s):  [name] Over the next 90 days, patient will work with PharmD and providers to  optimize antihypertensive regimen.  o Focus on preventing dizziness caused from low blood pressure.   Interventions: . Inter-disciplinary care team collaboration (see longitudinal plan of care) . Comprehensive medication review performed; medication list updated in the electronic medical record.  . BP Monitoring plan  Patient Self Care Activities:  . Patient will purchase BP cuff and check BP at various times throughout the day every day. Document in log, and provide at future appointments. . Patient will focus on medication adherence by continuing current medication management.   Initial goal documentation.    [name] COPD: Minimize recurring  symptoms       CARE PLAN ENTRY  Current Barriers:  . Chronic Disease Management support, education, and care coordination needs related to  COPD.   . Currently managed with:  Flovent 110 mcg/act 2 puffs twice daily.  Atrovent 2 puffs every 6 hours as needed for wheezing.  Pharmacist Clinical Goal(s):  [name] Over next 90 days, minimize recurring symptoms of COPD.   Interventions: . Comprehensive medication review performed.  Patient Self Care Activities:  . Patient verbalizes understanding of plan to continue with current medication management.  Initial goal documentation    . LDL <70       CARE PLAN ENTRY (see longitudinal plan of care for additional care plan information)  Current Barriers:   . Current antihyperlipidemic regimen: atorvastatin 40 mg daily  . Most recent lipid panel:     Component Value Date/Time   CHOL 115 04/17/2019 1200   TRIG 67.0 04/17/2019 1200   HDL 41.70 04/17/2019 1200   CHOLHDL 3 04/17/2019 1200   VLDL 13.4 04/17/2019 1200   LDLCALC 60 04/17/2019 1200 .  ASCVD risk enhancing conditions: age >37, HTN, history of stroke  Pharmacist Clinical Goal(s):  [name] Over the next 90 days, patient will work with PharmD and providers towards optimized antihyperlipidemic therapy  Interventions: . Comprehensive medication review performed; medication list updated in electronic medical record.  Bertram Savin care team collaboration (see longitudinal plan of care)  Patient Self Care Activities:  . Patient will focus on medication adherence by continuing current medication management.   Initial goal documentation    . Osteoporosis: Minimize fracture risk       CARE PLAN ENTRY  Current Barriers:  . Chronic Disease Management support, education, and care coordination needs related to osteoporosis.  Pharmacist Clinical Goal(s):  [name] Minimize fall risk - see hypertension plan.  Interventions: . Comprehensive medication review performed. . Plan to monitor  BP daily over next 90 days.  Patient Self Care Activities:  . Patient verbalizes understanding of plan to continue current medications for osteoporosis and monitor BP daily.  Initial goal documentation       Hypertension   BP today is: n/a. Patient does not have a blood pressure cuff at home.   Office blood pressures are  BP Readings from Last 3 Encounters:  06/05/19 121/81  05/23/19 134/82  05/19/19 (!) 173/92   Pulse Readings from Last 3 Encounters:  06/05/19 70  05/23/19 (!) 35  05/19/19 69   Pulse in the 90s today.   Mentions feeling dizzy recently and reports having a fall last week. This at least two reported falls within the last month. We discussed blood pressure and dizziness at length. Patient expresses agreement and understanding to have his son purchase a home BP monitor tonight then monitoring daily various times throughout the day.  Based on reading OV BP readings, patient is currently controlled on the following medications: atenolol  25 mg daily.   Patient checks BP at home never.  Patient home BP readings are ranging: n/a.  Plan Continue current medications    Hyperlipidemia   Lipid Panel     Component Value Date/Time   CHOL 115 04/17/2019 1200   TRIG 67.0 04/17/2019 1200   HDL 41.70 04/17/2019 1200   CHOLHDL 3 04/17/2019 1200   VLDL 13.4 04/17/2019 1200   LDLCALC 60 04/17/2019 1200    Patient is currently controlled on the following medications: atorvastatin 40 mg daily. LDL below 65. Denies any muscle or abdominal pain or n/v. Patient counseled on CV/lipid benefits.  Also taking aspirin 81 mg for primary prevention, history of stroke. Denies any abnormal bruising, bleeding from nose or gums or blood in urine or stool. Counseled on use and purpose.   Plan Continue current medications .  COPD / Tobacco   Tobacco Status:  Social History   Tobacco Use  Smoking Status Former Smoker  . Packs/day: 2.00  . Years: 20.00  . Pack years: 40.00    . Types: Cigarettes  . Quit date: 02/29/1988  . Years since quitting: 31.3  Smokeless Tobacco Never Used   Patient is currently controlled on the following medications:   Flovent 110 mcg/act 2 puffs twice daily.  Atrovent 2 puffs every 6 hours as needed for wheezing.  Using maintenance inhaler regularly? No  Frequency of rescue inhaler use:  infrequently.  Breathing has been well controlled with consistent use of Flovent. Expresses good understanding of inhaler purpose and proper administration.   Plan Continue current medications   Osteoporosis   Patient is currently controlled on the following medications:   Prolia 60 mg every six months  Alendronate 70 mg once weekly  Calcium-vitamin d 500-200 mg, vitamin d 1000 units once daily   We discussed proper administration of alendronate. Denies any side effects at this time.  Plan Continue current medications  BPH   Patient is currently controlled on the following medications: Tamsulosin 0.4 mg daily. Still getting up once or twice a night but mentions this has been his normal. States not having issues voiding but frequency might be increasing throughout the day but is not bothersome.  Plan Continue current medication. Will continue to monitor.   Vaccines   Reviewed and discussed patient's vaccination history.  Patient is up to date.   Immunization History  Administered Date(s) Administered  . Fluad Quad(high Dose 65+) 11/07/2018  . Influenza Split 11/28/2012  . Influenza, High Dose Seasonal PF 10/26/2016  . Influenza,inj,Quad PF,6+ Mos 11/27/2013, 11/03/2015, 10/16/2017  . PFIZER SARS-COV-2 Vaccination 03/21/2019, 04/11/2019  . Pneumococcal Conjugate-13 10/23/2013  . Pneumococcal Polysaccharide-23 10/21/2015  . Tdap 05/19/2019  . Zoster Recombinat (Shingrix) 05/23/2017, 07/31/2017   Plan No recommendations at this time.   Medication Management   Pt uses Chief Operating Officer for all medications. Uses pill box?  Yes.   Plan Continue current medication management strategy.  Follow up: 1 month phone visit ______________ Visit Information SDOH (Social Determinants of Health) assessments performed: Yes.   Food Insecurity: No Food Insecurity  . Worried About Charity fundraiser in the Last Year: Never true  . Ran Out of Food in the Last Year: Never true     Transportation Needs: No Transportation Needs  . Lack of Transportation (Medical): No  . Lack of Transportation (Non-Medical): No   Mr. Rodela was given information about Chronic Care Management services today including:  1. CCM service includes personalized support from designated clinical staff supervised  by his physician, including individualized plan of care and coordination with other care providers 2. 24/7 contact phone numbers for assistance for urgent and routine care needs. 3. Standard insurance, coinsurance, copays and deductibles apply for chronic care management only during months in which we provide at least 20 minutes of these services. Most insurances cover these services at 100%, however patients may be responsible for any copay, coinsurance and/or deductible if applicable. This service may help you avoid the need for more expensive face-to-face services. 4. Only one practitioner may furnish and bill the service in a calendar month. 5. The patient may stop CCM services at any time (effective at the end of the month) by phone call to the office staff.  Patient agreed to services and verbal consent obtained.   Madelin Rear, Pharm.D. Clinical Pharmacist [place] [facility] at [facility] [phone]

## 2019-06-26 ENCOUNTER — Ambulatory Visit: Payer: Medicare Other

## 2019-06-26 DIAGNOSIS — E785 Hyperlipidemia, unspecified: Secondary | ICD-10-CM

## 2019-06-26 DIAGNOSIS — I10 Essential (primary) hypertension: Secondary | ICD-10-CM

## 2019-06-26 DIAGNOSIS — J449 Chronic obstructive pulmonary disease, unspecified: Secondary | ICD-10-CM

## 2019-06-26 DIAGNOSIS — M810 Age-related osteoporosis without current pathological fracture: Secondary | ICD-10-CM

## 2019-07-02 ENCOUNTER — Ambulatory Visit: Payer: Medicare Other

## 2019-07-02 DIAGNOSIS — I10 Essential (primary) hypertension: Secondary | ICD-10-CM

## 2019-07-02 NOTE — Progress Notes (Signed)
Chronic Care Management Pharmacy   Name: [name]  MRN: [id number] DOB: [birth year]  Chief Complaint/ HPI  [name],  86 y.o. , male presents for their Follow-Up CCM visit with the clinical pharmacist via telephone due to COVID-19 Pandemic.  PCP : [name], MD  Their chronic conditions include: HTN, HLD, osteoporosis, COPD, CVA, BPH.  Office Visits:  4/28 [facility]): BP monitoring plan, once daily at various times x2weeks   4/7 (PCP): f/u on forearm skin tear, area healed. No further care needed. Continue Ensure for protein malnutrition.  Consult Visit:  3/21 (ED): skin tear as mentioned above, fell coming out of [employer].   Medications: Outpatient Encounter Medications as of 07/02/2019  Medication Sig Note  . acetaminophen (TYLENOL) 500 MG tablet Take 500-1,000 mg by mouth every 8 (eight) hours as needed for mild pain or headache.    . alendronate (FOSAMAX) 70 MG tablet Take 1 tablet (70 mg total) by mouth every Friday. take with a full glass of water on an empty stomach   . aspirin EC 81 MG tablet Take 81 mg by mouth daily.   [name] atenolol (TENORMIN) 25 MG tablet TAKE ONE TABLET BY MOUTH DAILY   . atorvastatin (LIPITOR) 20 MG tablet TAKE 1 TABLET (20 MG TOTAL) BY MOUTH DAILY.   . calcium-vitamin D (OSCAL WITH D) 500-200 MG-UNIT tablet Take 1 tablet by mouth 2 (two) times daily.   . Cholecalciferol (VITAMIN D-3) 1000 units CAPS Take 1,000 Units by mouth 2 (two) times a day.    . denosumab (PROLIA) 60 MG/ML SOSY injection Inject 60 mg into the skin every 6 (six) months. 05/27/2018: Next injection due May or June  . feeding supplement, ENSURE ENLIVE, (ENSURE ENLIVE) LIQD Take 237 mLs by mouth 3 (three) times daily between meals. (Patient taking differently: Take 237 mLs by mouth 2 (two) times daily between meals. )   . fluticasone (FLOVENT HFA) 110 MCG/ACT inhaler USE 2 INHALATIONS TWICE A DAY (Patient taking differently: Inhale 2 puffs into the lungs 2 (two) times  a day. )   . ipratropium (ATROVENT HFA) 17 MCG/ACT inhaler Inhale 2 puffs into the lungs every 6 (six) hours as needed for wheezing.   . lidocaine (LIDODERM) 5 % Place 1 patch onto the skin daily. Remove & Discard patch within 12 hours or as directed by MD (Patient not taking: Reported on 06/26/2019)   . Multiple Vitamin (MULTIVITAMIN WITH MINERALS) TABS tablet Take 1 tablet by mouth daily.   [name]-Propyl Glycol (SYSTANE) 0.4-0.3 % SOLN Place 1 drop into both eyes 3 (three) times daily as needed (dry eyes).    . tamsulosin (FLOMAX) 0.4 MG CAPS capsule TAKE ONE CAPSULE BY MOUTH DAILY   . traMADol (ULTRAM) 50 MG tablet Take 50 mg by mouth every 6 (six) hours as needed for moderate pain.     No facility-administered encounter medications on file as of 07/02/2019.   Current Diagnosis/Assessment: Goals Addressed            This Visit's Progress   . BP <140/90       CARE PLAN ENTRY (see longitudinal plan of care for additional care plan information)  Current Barriers:  . Current antihypertensive regimen: atenolol 25 mg daily  . Last practice recorded BP readings:  BP Readings from Last 3 Encounters:  06/05/19 121/81  05/23/19 134/82  05/19/19 (!) 173/92 .  Current home BP readings: 4/29-5/4  AM, right before taking atenolol: 152/87, 145/76, 179/98,  161/95, 178/91  Noon: 120/67, 137/69, 130/71   Evening: 159/86, 153/(?), 177/62. 168/66   Pharmacist Clinical Goal(s):  [name] Over the next 90 days, patient will work with PharmD and providers to optimize antihypertensive regimen.  o Focus on preventing dizziness that may be caused from low blood pressure.   Interventions: . Inter-disciplinary care team collaboration (see longitudinal plan of care) . Comprehensive medication review performed; medication list updated in the electronic medical record.  . BP Monitoring plan  Patient Self Care Activities:  . Patient will purchase BP cuff and check BP at various times throughout  the day every day. Document in log, and provide at future appointments. . Patient will focus on medication adherence by continuing current medication management.   Initial goal documentation.      Hypertension   BP today is: 130/71.  Office blood pressures are  BP Readings from Last 3 Encounters:  06/05/19 121/81  05/23/19 134/82  05/19/19 (!) 173/92   Pulse Readings from Last 3 Encounters:  06/05/19 70  05/23/19 (!) 35  05/19/19 69   Pt had expressed feeling dizzy last visit 4/28 causing fall. Had another previous fall 3/21 coming out of [employer].   Has been monitoring at home with support of son since 4/29 after purchasing new BP meter. Home readings are as follows:  AM, right before taking atenolol: 152/87, 145/76, 179/98, 161/95, 178/91  Noon: 120/67, 137/69, 130/71   Evening: 159/86, 153/(?), 177/62. 168/66  Readings trend high in early morning and evening and drop morning to early afternoon. States dizziness has been consistently present throughout morning.   Plan Recommend switch from atenolol to lisinopril 5-10 mg or extended release beta blocker, metoprolol succinate.  Madelin Rear, Pharm.D. Clinical Pharmacist [facility] [place] [facility] at [facility] [phone]

## 2019-07-02 NOTE — Patient Instructions (Addendum)
Visit Information  Goals Addressed            This Visit's Progress   . BP <140/90       CARE PLAN ENTRY (see longitudinal plan of care for additional care plan information)  Current Barriers:  . Current antihypertensive regimen: atenolol 25 mg daily  . Last practice recorded BP readings:  BP Readings from Last 3 Encounters:  06/05/19 121/81  05/23/19 134/82  05/19/19 (!) 173/92 .  Current home BP readings: 4/29-5/4  AM, right before taking atenolol: 152/87, 145/76, 179/98, 161/95, 178/91  Noon: 120/67, 137/69, 130/71   Evening: 159/86, 153/(?), 177/62. 168/66   Pharmacist Clinical Goal(s):  [name] Over the next 90 days, patient will work with PharmD and providers to optimize antihypertensive regimen.  o Focus on preventing dizziness that may be caused from low blood pressure.   Interventions: . Inter-disciplinary care team collaboration (see longitudinal plan of care) . Comprehensive medication review performed; medication list updated in the electronic medical record.  . BP Monitoring plan  Patient Self Care Activities:  . Patient will purchase BP cuff and check BP at various times throughout the day every day. Document in log, and provide at future appointments. . Patient will focus on medication adherence by continuing current medication management.   Initial goal documentation.      [name] was given information about Chronic Care Management services today including:  1. CCM service includes personalized support from designated clinical staff supervised by his physician, including individualized plan of care and coordination with other care providers 2. 24/7 contact phone numbers for assistance for urgent and routine care needs. 3. Standard insurance, coinsurance, copays and deductibles apply for chronic care management only during months in which we provide at least 20 minutes of these services. Most insurances cover these services at 100%, however patients may be  responsible for any copay, coinsurance and/or deductible if applicable. This service may help you avoid the need for more expensive face-to-face services. 4. Only one practitioner may furnish and bill the service in a calendar month. 5. The patient may stop CCM services at any time (effective at the end of the month) by phone call to the office staff.  Patient agreed to services and verbal consent obtained.   The patient verbalized understanding of instructions provided today and agreed to receive a mailed copy of patient instruction and/or educational materials. Telephone follow up appointment with pharmacy team member scheduled for:  See next appointment with "Care Management Staff" under "What's Next" below.   Thank you!  Madelin Rear, Pharm.D. Clinical Pharmacist [place] [facility] at [facility] [phone]

## 2019-07-03 ENCOUNTER — Other Ambulatory Visit: Payer: Self-pay

## 2019-07-03 ENCOUNTER — Ambulatory Visit (INDEPENDENT_AMBULATORY_CARE_PROVIDER_SITE_OTHER): Payer: Medicare Other | Admitting: Physician Assistant

## 2019-07-03 ENCOUNTER — Other Ambulatory Visit (INDEPENDENT_AMBULATORY_CARE_PROVIDER_SITE_OTHER): Payer: Medicare Other

## 2019-07-03 ENCOUNTER — Ambulatory Visit (INDEPENDENT_AMBULATORY_CARE_PROVIDER_SITE_OTHER): Payer: Medicare Other

## 2019-07-03 VITALS — BP 160/86 | HR 68 | Temp 98.0°F | Ht 72.0 in | Wt 122.6 lb

## 2019-07-03 DIAGNOSIS — R55 Syncope and collapse: Secondary | ICD-10-CM

## 2019-07-03 DIAGNOSIS — R42 Dizziness and giddiness: Secondary | ICD-10-CM | POA: Diagnosis not present

## 2019-07-03 DIAGNOSIS — I951 Orthostatic hypotension: Secondary | ICD-10-CM | POA: Diagnosis not present

## 2019-07-03 DIAGNOSIS — Z0000 Encounter for general adult medical examination without abnormal findings: Secondary | ICD-10-CM | POA: Diagnosis not present

## 2019-07-03 LAB — COMPREHENSIVE METABOLIC PANEL
ALT: 22 U/L (ref 0–53)
AST: 18 U/L (ref 0–37)
Albumin: 4 g/dL (ref 3.5–5.2)
Alkaline Phosphatase: 73 U/L (ref 39–117)
BUN: 20 mg/dL (ref 6–23)
CO2: 31 mEq/L (ref 19–32)
Calcium: 9.6 mg/dL (ref 8.4–10.5)
Chloride: 101 mEq/L (ref 96–112)
Creatinine, Ser: 0.74 mg/dL (ref 0.40–1.50)
GFR: 100.37 mL/min (ref >60.00)
Glucose, Bld: 104 mg/dL — ABNORMAL HIGH (ref 70–99)
Potassium: 4.2 mEq/L (ref 3.5–5.1)
Sodium: 139 mEq/L (ref 135–145)
Total Bilirubin: 0.9 mg/dL (ref 0.2–1.2)
Total Protein: 5.8 g/dL — ABNORMAL LOW (ref 6.0–8.3)

## 2019-07-03 LAB — CBC WITH DIFFERENTIAL/PLATELET
Basophils Absolute: 0 10*3/uL (ref 0.0–0.1)
Basophils Relative: 0.2 % (ref 0.0–3.0)
Eosinophils Absolute: 0.2 10*3/uL (ref 0.0–0.7)
Eosinophils Relative: 0.9 % (ref 0.0–5.0)
HCT: 46 % (ref 39.0–52.0)
Hemoglobin: 15.1 g/dL (ref 13.0–17.0)
Lymphocytes Relative: 14.2 % (ref 12.0–46.0)
Lymphs Abs: 2.5 10*3/uL (ref 0.7–4.0)
MCHC: 32.9 g/dL (ref 30.0–36.0)
MCV: 94.6 fl (ref 78.0–100.0)
Monocytes Absolute: 1.3 10*3/uL — ABNORMAL HIGH (ref 0.1–1.0)
Monocytes Relative: 7.4 % (ref 3.0–12.0)
Neutro Abs: 13.5 10*3/uL — ABNORMAL HIGH (ref 1.4–7.7)
Neutrophils Relative %: 77.3 % — ABNORMAL HIGH (ref 43.0–77.0)
Platelets: 250 10*3/uL (ref 150.0–400.0)
RBC: 4.86 Mil/uL (ref 4.22–5.81)
RDW: 14.7 % (ref 11.5–15.5)
WBC: 17.5 10*3/uL — ABNORMAL HIGH (ref 4.0–10.5)

## 2019-07-03 LAB — GLUCOSE, POCT (MANUAL RESULT ENTRY): POC Glucose: 87 mg/dl (ref 70–99)

## 2019-07-03 MED ORDER — METOPROLOL SUCCINATE ER 25 MG PO TB24: 25.0000 mg | ORAL_TABLET | Freq: Every day | ORAL | 1 refills | Status: DC

## 2019-07-03 NOTE — Progress Notes (Addendum)
Subjective:   [name] is a 86 y.o. male who presents for Medicare Annual/Subsequent preventive examination.  Review of Systems:   Cardiac Risk Factors include: advanced age (>23men, >41 women);hypertension;male gender    Objective:    Vitals: BP (!) 160/86   Pulse 68   Temp 98 F (36.7 C) (Temporal)   Ht 6' (1.829 m)   Wt 122 lb 9.6 oz (55.6 kg)   SpO2 97%   BMI 16.63 kg/m   Body mass index is 16.63 kg/m.  Advanced Directives 07/03/2019 05/19/2019 09/18/2018 07/05/2018 05/26/2018 04/12/2018 10/26/2016  Does Patient Have a Medical Advance Directive? No No No Yes No No No  Type of Advance Directive - - - Living will;Healthcare Power of Attorney - - -  Does patient want to make changes to medical advance directive? - - - No - Guardian declined - - -  Would patient like information on creating a medical advance directive? Yes (MAU/Ambulatory/Procedural Areas - Information given) Yes (ED - Information included in AVS) No - Patient declined - No - Patient declined No - Patient declined Yes (MAU/Ambulatory/Procedural Areas - Information given)    Tobacco Social History   Tobacco Use  Smoking Status Former Smoker  . Packs/day: 2.00  . Years: 20.00  . Pack years: 40.00  . Types: Cigarettes  . Quit date: 02/29/1988  . Years since quitting: 31.3  Smokeless Tobacco Never Used     Counseling given: Not Answered   Clinical Intake:  Pre-visit preparation completed: Yes  Pain : No/denies pain  Diabetes: No  How often do you need to have someone help you when you read instructions, pamphlets, or other written materials from your doctor or pharmacy?: 2 - Rarely  Interpreter Needed?: No  Information entered by :: Denman George LPN  Past Medical History:  Diagnosis Date  . Arthritis    hands   . BPH (benign prostatic hyperplasia)   . COPD (chronic obstructive pulmonary disease) ([place])   . Coronary artery disease   . CVA (cerebral infarction) 3 years ago   mild stroke    . HLD (hyperlipidemia)   . Hypertension   . Osteoporosis   . Pneumonia   . 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 SCC (squamous cell carcinoma) in situ 12/12/2017   left cheek inf.  . 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 BACK SURGERY    . LAPAROSCOPIC APPENDECTOMY N/A 11/02/2013   Procedure: APPENDECTOMY LAPAROSCOPIC;  Surgeon: Donnie Mesa, MD;  Location: [facility] OR;  Service: General;  Laterality: N/A;   Family History  Problem Relation Age of Onset  . Colon cancer Brother   . Heart disease Brother   . Cancer Father   . Colon cancer Brother   . Aneurysm Sister    Social History   Socioeconomic History  . Marital status: Widowed    Spouse name: Not on file  . Number of children: 3  . Years of education: Not on file  . Highest education level: Not on file  Occupational History  . Occupation: Retired  Tobacco Use  . Smoking status: Former Smoker    Packs/day: 2.00    Years: 20.00    Pack years: 40.00    Types: Cigarettes    Quit date: 02/29/1988    Years since quitting: 31.3  . Smokeless tobacco: Never Used  Substance and Sexual Activity  . Alcohol use: No    Alcohol/week: 0.0 standard drinks  . Drug use: No  . Sexual activity: Not Currently  Other Topics Concern  . Not on file  Social History Narrative   Retired from [employer]    3 sons; 4 grandchildren    Doesn't drive    Social Determinants of Health   Financial Resource Strain:   . Difficulty of Paying Living Expenses:   Food Insecurity: No Food Insecurity  . Worried About Charity fundraiser in the Last Year: Never true  . Ran Out of Food in the Last Year: Never true  Transportation Needs: No Transportation Needs  . Lack of Transportation (Medical): No  . Lack of Transportation (Non-Medical): No  Physical Activity:   . Days of Exercise per Week:   . Minutes of Exercise per Session:    Stress:   . Feeling of Stress :   Social Connections:   . Frequency of Communication with Friends and Family:   . Frequency of Social Gatherings with Friends and Family:   . Attends Religious Services:   . Active Member of Clubs or Organizations:   . Attends Archivist Meetings:   [name] Marital Status:     Outpatient Encounter Medications as of 07/03/2019  Medication Sig  . acetaminophen (TYLENOL) 500 MG tablet Take 500-1,000 mg by mouth every 8 (eight) hours as needed for mild pain or headache.   . alendronate (FOSAMAX) 70 MG tablet Take 1 tablet (70 mg total) by mouth every Friday. take with a full glass of water on an empty stomach  . aspirin EC 81 MG tablet Take 81 mg by mouth daily.  [name] atenolol (TENORMIN) 25 MG tablet TAKE ONE TABLET BY MOUTH DAILY  . atorvastatin (LIPITOR) 20 MG tablet TAKE 1 TABLET (20 MG TOTAL) BY MOUTH DAILY.  . calcium-vitamin D (OSCAL WITH D) 500-200 MG-UNIT tablet Take 1 tablet by mouth 2 (two) times daily.  . Cholecalciferol (VITAMIN D-3) 1000 units CAPS Take 1,000 Units by mouth 2 (two) times a day.   . denosumab (PROLIA) 60 MG/ML SOSY injection Inject 60 mg into the skin every 6 (six) months.  . feeding supplement, ENSURE ENLIVE, (ENSURE ENLIVE) LIQD Take 237 mLs by mouth 3 (three) times daily between meals. (Patient taking differently: Take 237 mLs by mouth 2 (two) times daily between meals. )  . fluticasone (FLOVENT HFA) 110 MCG/ACT inhaler USE 2 INHALATIONS TWICE A DAY (Patient taking differently: Inhale 2 puffs into the lungs 2 (two) times a day. )  . ipratropium (ATROVENT HFA) 17 MCG/ACT inhaler Inhale 2 puffs into the lungs every 6 (six) hours as needed for wheezing.  . lidocaine (LIDODERM) 5 % Place 1 patch onto the skin daily. Remove & Discard patch within 12 hours or as directed by MD  . Multiple Vitamin (MULTIVITAMIN WITH MINERALS) TABS tablet Take 1 tablet by mouth daily.  [name]-Propyl Glycol (SYSTANE) 0.4-0.3 % SOLN Place 1 drop  into both eyes 3 (three) times daily as needed (dry eyes).   . tamsulosin (FLOMAX) 0.4 MG CAPS capsule TAKE ONE CAPSULE BY MOUTH DAILY  . traMADol (ULTRAM) 50 MG tablet Take 50 mg by mouth every 6 (six) hours as needed for moderate pain.    No facility-administered encounter medications on file as of 07/03/2019.    Activities of Daily Living In your present state of health, do you have any difficulty performing the following activities: 07/03/2019 05/23/2019  Hearing?  N N  Vision? N N  Difficulty concentrating or making decisions? N N  Walking or climbing stairs? Y N  Dressing or bathing? N N  Doing errands, shopping? N N  Preparing Food and eating ? N -  Using the Toilet? N -  In the past six months, have you accidently leaked urine? N -  Do you have problems with loss of bowel control? N -  Managing your Medications? N -  Managing your Finances? N -  Housekeeping or managing your Housekeeping? N -  Some recent data might be hidden    Patient Care Team: [name], MD as PCP - General (Family Medicine) Marin Olp Rudell Cobb, MD as Consulting Physician (Oncology) Tanda Rockers, MD as Consulting Physician (Pulmonary Disease) Renato Shin, MD as Consulting Physician (Endocrinology) [facility] (Dermatology) Jodi Marble, MD as Consulting Physician (Otolaryngology) Monna Fam, MD as Consulting Physician (Ophthalmology) Madelin Rear, [facility] as Pharmacist (Pharmacist)   Assessment:   This is a routine wellness examination for [place].  Exercise Activities and Dietary recommendations Current Exercise Habits: The patient does not participate in regular exercise at present  Goals    . BP <140/90     CARE PLAN ENTRY (see longitudinal plan of care for additional care plan information)  Current Barriers:  . Current antihypertensive regimen: atenolol 25 mg daily  . Last practice recorded BP readings:  BP Readings from Last 3 Encounters:  06/05/19 121/81    05/23/19 134/82  05/19/19 (!) 173/92 .  Current home BP readings: 4/29-5/4  AM, right before taking atenolol: 152/87, 145/76, 179/98, 161/95, 178/91  Noon: 120/67, 137/69, 130/71   Evening: 159/86, 153/(?), 177/62. 168/66   Pharmacist Clinical Goal(s):  [name] Over the next 90 days, patient will work with PharmD and providers to optimize antihypertensive regimen.  o Focus on preventing dizziness that may be caused from low blood pressure.   Interventions: . Inter-disciplinary care team collaboration (see longitudinal plan of care) . Comprehensive medication review performed; medication list updated in the electronic medical record.  . BP Monitoring plan  Patient Self Care Activities:  . Patient will purchase BP cuff and check BP at various times throughout the day every day. Document in log, and provide at future appointments. . Patient will focus on medication adherence by continuing current medication management.   Initial goal documentation.    [name] COPD: Minimize recurring symptoms     CARE PLAN ENTRY  Current Barriers:  . Chronic Disease Management support, education, and care coordination needs related to  COPD.   . Currently managed with:  Flovent 110 mcg/act 2 puffs twice daily.  Atrovent 2 puffs every 6 hours as needed for wheezing.  Pharmacist Clinical Goal(s):  [name] Over next 90 days, minimize recurring symptoms of COPD.   Interventions: . Comprehensive medication review performed.  Patient Self Care Activities:  . Patient verbalizes understanding of plan to continue with current medication management.  Initial goal documentation    . LDL <70     CARE PLAN ENTRY (see longitudinal plan of care for additional care plan information)  Current Barriers:   . Current antihyperlipidemic regimen: atorvastatin 40 mg daily  . Most recent lipid panel:     Component Value Date/Time   CHOL 115 04/17/2019 1200   TRIG 67.0 04/17/2019 1200   HDL 41.70 04/17/2019 1200    CHOLHDL 3 04/17/2019 1200   VLDL 13.4 04/17/2019 1200   LDLCALC 60 04/17/2019 1200 .  ASCVD risk enhancing conditions: age >  21, HTN, history of stroke  Pharmacist Clinical Goal(s):  [name] Over the next 90 days, patient will work with PharmD and providers towards optimized antihyperlipidemic therapy  Interventions: . Comprehensive medication review performed; medication list updated in electronic medical record.  Bertram Savin care team collaboration (see longitudinal plan of care)  Patient Self Care Activities:  . Patient will focus on medication adherence by continuing current medication management.   Initial goal documentation    . Osteoporosis: Minimize fracture risk     CARE PLAN ENTRY  Current Barriers:  . Chronic Disease Management support, education, and care coordination needs related to osteoporosis.  Pharmacist Clinical Goal(s):  [name] Minimize fall risk - see hypertension plan.  Interventions: . Comprehensive medication review performed. . Plan to monitor BP daily over next 90 days.  Patient Self Care Activities:  . Patient verbalizes understanding of plan to continue current medications for osteoporosis and monitor BP daily.  Initial goal documentation     . patient (pt-stated)     Maintain current health by staying active.     . Patient Stated     Maintain current health.        Fall Risk Fall Risk  07/03/2019 05/23/2019 05/23/2019 04/17/2019 10/10/2018  Falls in the past year? 1 1 0 0 1  Number falls in past yr: 1 0 0 0 0  Injury with Fall? 1 1 0 0 0  Risk Factor Category  - - - - -  Risk for fall due to : History of fall(s);Impaired mobility;Impaired balance/gait Impaired mobility - - -  Follow up Education provided;Falls prevention discussed;Falls evaluation completed Falls evaluation completed Falls evaluation completed Falls evaluation completed -   Is the patient's home free of loose throw rugs in walkways, pet beds, electrical cords, etc?   yes       Grab bars in the bathroom? yes      Handrails on the stairs?   yes      Adequate lighting?   yes  Timed Get Up and Go Performed: completed and within normal timeframe; no gait abnormalities noted (requires the use of a cane to aid ambulation)   Depression Screen PHQ 2/9 Scores 07/03/2019 05/23/2019 04/17/2019 10/10/2018  PHQ - 2 Score 0 0 0 0  PHQ- 9 Score - - - 0  Exception Documentation - - - -    Cognitive Function MMSE - Mini Mental State Exam 04/12/2018  Orientation to time 5  Orientation to Place 5  Registration 3  Attention/ Calculation 3  Recall 0  Language- name 2 objects 2  Language- repeat 1  Language- follow 3 step command 3  Language- read & follow direction 1  Write a sentence 1  Copy design 1  Total score 25     6CIT Screen 07/03/2019  What Year? 0 points  What month? 0 points  What time? 0 points  Count back from 20 0 points  Months in reverse 0 points  Repeat phrase 0 points  Total Score 0    Immunization History  Administered Date(s) Administered  . Fluad Quad(high Dose 65+) 11/07/2018  . Influenza Split 11/28/2012  . Influenza, High Dose Seasonal PF 10/26/2016  . Influenza,inj,Quad PF,6+ Mos 11/27/2013, 11/03/2015, 10/16/2017  . PFIZER SARS-COV-2 Vaccination 03/21/2019, 04/11/2019  . Pneumococcal Conjugate-13 10/23/2013  . Pneumococcal Polysaccharide-23 10/21/2015  . Tdap 05/19/2019  . Zoster Recombinat (Shingrix) 05/23/2017, 07/31/2017    Qualifies for Shingles Vaccine? Shingrix completed   Screening Tests Health Maintenance  Topic Date Due  .  INFLUENZA VACCINE  09/29/2019  . TETANUS/TDAP  05/18/2029  . COVID-19 Vaccine  Completed  . PNA vac Low Risk Adult  Completed   Cancer Screenings: Lung: Low Dose CT Chest recommended if Age 65-80 years, 30 pack-year currently smoking OR have quit w/in 15years. Patient does not qualify. Colorectal: No longer indicated    Plan:  I have personally reviewed and addressed the Medicare Annual Wellness  questionnaire and have noted the following in the patient's chart:  A. Medical and social history B. Use of alcohol, tobacco or illicit drugs  C. Current medications and supplements D. Functional ability and status E.  Nutritional status F.  Physical activity G. Advance directives H. List of other physicians I.  Hospitalizations, surgeries, and ER visits in previous 12 months J.  Alger such as hearing and vision if needed, cognitive and depression L. Referrals, records requested, and appointments- none   In addition, I have reviewed and discussed with patient certain preventive protocols, quality metrics, and best practice recommendations. A written personalized care plan for preventive services as well as general preventive health recommendations were provided to patient.   Signed,  Denman George, LPN  Nurse Health Advisor   Nurse Notes: no additional

## 2019-07-03 NOTE — Patient Instructions (Signed)
Please go to the lab today for blood work.  I will call you with your results. We will alter treatment regimen(s) if indicated by your results.   Please stop the Atenolol. Start taking the Metoprolol as directed once daily. Continue checking BP daily and recording, make sure to check 1 hour after taking medication.   I want you to increase fluid intake.  I also want you to start wearing compression stockings during the day to help prevent the drop in BP with standing.  I have given you a prescription for these to take to the pharmacy.  Follow-up with me or Dr. [name] in 7-10 days. Sooner if needed.

## 2019-07-03 NOTE — Progress Notes (Signed)
Patient presents to clinic today c/o lightheadedness a 2 months. Episodes occur with standing from a seated position and sometimes with ambulation. Denies true vertigo or feeling off-balance. Denies syncopal event. Does note one episode of fall 2/2 lightheadedness occurring a couple of months ago. Denies any head injury or LOC. Denies vision changes. Is using his cane to ambulate. Also taking all medications as directed for his HTN and HLD. Is currently on a regimen of Atenolol 25mg  daily. Notes checking BP daily and recording. Notes BP levels are highly variable - from 120 midday to high 170s in the AM and evening. Eats three meals daily without fail. Tries to keep hydrated. Had an appointment with PharmD last week for BP reviewed. Noted "dizziness" so was told to make an appointment with provider.  Past Medical History:  Diagnosis Date  . Arthritis    hands   . BPH (benign prostatic hyperplasia)   . COPD (chronic obstructive pulmonary disease) ([place])   . Coronary artery disease   . CVA (cerebral infarction) 3 years ago   mild stroke  . HLD (hyperlipidemia)   . Hypertension   . Osteoporosis   . Pneumonia   . 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 SCC (squamous cell carcinoma) in situ 12/12/2017   left cheek inf.  . 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 acetaminophen (TYLENOL) 500 MG tablet Take 500-1,000 mg by mouth every 8 (eight) hours as needed for mild pain or headache.     . alendronate (FOSAMAX) 70 MG tablet Take 1 tablet (70 mg total) by mouth every Friday. take with a full glass of water on an empty stomach 12 tablet 3  . aspirin EC 81 MG tablet Take 81 mg by mouth daily.    [name] atorvastatin (LIPITOR) 20 MG tablet TAKE 1 TABLET (20 MG TOTAL) BY MOUTH DAILY. 90 tablet 0  . calcium-vitamin D (OSCAL WITH D) 500-200 MG-UNIT tablet  Take 1 tablet by mouth 2 (two) times daily.    . Cholecalciferol (VITAMIN D-3) 1000 units CAPS Take 1,000 Units by mouth 2 (two) times a day.     . denosumab (PROLIA) 60 MG/ML SOSY injection Inject 60 mg into the skin every 6 (six) months.    . feeding supplement, ENSURE ENLIVE, (ENSURE ENLIVE) LIQD Take 237 mLs by mouth 3 (three) times daily between meals. (Patient taking differently: Take 237 mLs by mouth 2 (two) times daily between meals. ) 237 mL 12  . fluticasone (FLOVENT HFA) 110 MCG/ACT inhaler USE 2 INHALATIONS TWICE A DAY (Patient taking differently: Inhale 2 puffs into the lungs 2 (two) times a day. ) 36 g 3  . ipratropium (ATROVENT HFA) 17 MCG/ACT inhaler Inhale 2 puffs into the lungs every 6 (six) hours as needed for wheezing. 3 Inhaler 3  . lidocaine (LIDODERM) 5 % Place 1 patch onto the skin daily. Remove & Discard patch within 12 hours or as directed by MD 30 patch 0  . Multiple Vitamin (MULTIVITAMIN WITH MINERALS) TABS tablet Take 1 tablet by mouth daily. 30 tablet 1  . Polyethyl Glycol-Propyl Glycol (SYSTANE) 0.4-0.3 % SOLN Place 1 drop into both eyes 3 (three) times daily as needed (dry eyes).     . tamsulosin (FLOMAX) 0.4 MG CAPS capsule TAKE ONE CAPSULE BY MOUTH DAILY 90 capsule  0  . traMADol (ULTRAM) 50 MG tablet Take 50 mg by mouth every 6 (six) hours as needed for moderate pain.      No current facility-administered medications on file prior to visit.    No Known Allergies  Family History  Problem Relation Age of Onset  . Colon cancer Brother   . Heart disease Brother   . Cancer Father   . Colon cancer Brother   . Aneurysm Sister     Social History   Socioeconomic History  . Marital status: Widowed    Spouse name: Not on file  . Number of children: 3  . Years of education: Not on file  . Highest education level: Not on file  Occupational History  . Occupation: Retired  Tobacco Use  . Smoking status: Former Smoker    Packs/day: 2.00    Years: 20.00     Pack years: 40.00    Types: Cigarettes    Quit date: 02/29/1988    Years since quitting: 31.3  . Smokeless tobacco: Never Used  Substance and Sexual Activity  . Alcohol use: No    Alcohol/week: 0.0 standard drinks  . Drug use: No  . Sexual activity: Not Currently  Other Topics Concern  . Not on file  Social History Narrative   Retired from [employer]    3 sons; 4 grandchildren    Doesn't drive    Social Determinants of Health   Financial Resource Strain:   . Difficulty of Paying Living Expenses:   Food Insecurity: No Food Insecurity  . Worried About Charity fundraiser in the Last Year: Never true  . Ran Out of Food in the Last Year: Never true  Transportation Needs: No Transportation Needs  . Lack of Transportation (Medical): No  . Lack of Transportation (Non-Medical): No  Physical Activity:   . Days of Exercise per Week:   . Minutes of Exercise per Session:   Stress:   . Feeling of Stress :   Social Connections:   . Frequency of Communication with Friends and Family:   . Frequency of Social Gatherings with Friends and Family:   . Attends Religious Services:   . Active Member of Clubs or Organizations:   . Attends Archivist Meetings:   [name] Marital Status:    Review of Systems - See HPI.  All other ROS are negative.  BP (!) 160/86 Comment: took Atenolol at 7am  Pulse 68   Temp 98 F (36.7 C) (Temporal)   Ht 6' (1.829 m)   Wt 122 lb 9.6 oz (55.6 kg)   SpO2 97%   BMI 16.63 kg/m   Physical Exam Vitals reviewed.  Constitutional:      Appearance: Normal appearance.  HENT:     Head: Normocephalic and atraumatic.  Eyes:     Conjunctiva/sclera: Conjunctivae normal.     Pupils: Pupils are equal, round, and reactive to light.  Cardiovascular:     Rate and Rhythm: Normal rate and regular rhythm.     Pulses: Normal pulses.     Heart sounds: Normal heart sounds.  Pulmonary:     Effort: Pulmonary effort is normal.     Breath sounds: Normal breath sounds.    Musculoskeletal:     Cervical back: Neck supple.  Neurological:     General: No focal deficit present.     Mental Status: He is alert and oriented to person, place, and time. Mental status is at baseline.  Psychiatric:  Mood and Affect: Mood normal.     Recent Results (from the past 2160 hour(s))  Lipid panel     Status: None   Collection Time: 04/17/19 12:00 PM  Result Value Ref Range   Cholesterol 115 0 - 200 mg/dL    Comment: ATP III Classification       Desirable:  < 200 mg/dL               Borderline High:  200 - 239 mg/dL          High:  > = 240 mg/dL   Triglycerides 67.0 0.0 - 149.0 mg/dL    Comment: Normal:  <150 mg/dLBorderline High:  150 - 199 mg/dL   HDL 41.70 >39.00 mg/dL   VLDL 13.4 0.0 - 40.0 mg/dL   LDL Cholesterol 60 0 - 99 mg/dL   Total CHOL/HDL Ratio 3     Comment:                Men          Women1/2 Average Risk     3.4          3.3Average Risk          5.0          4.42X Average Risk          9.6          7.13X Average Risk          15.0          11.0                       NonHDL 73.61     Comment: NOTE:  Non-HDL goal should be 30 mg/dL higher than patient's LDL goal (i.e. LDL goal of < 70 mg/dL, would have non-HDL goal of < 100 mg/dL)  Basic metabolic panel     Status: None   Collection Time: 04/17/19 12:00 PM  Result Value Ref Range   Sodium 140 135 - 145 mEq/L   Potassium 4.7 3.5 - 5.1 mEq/L   Chloride 102 96 - 112 mEq/L   CO2 27 19 - 32 mEq/L   Glucose, Bld 80 70 - 99 mg/dL   BUN 14 6 - 23 mg/dL   Creatinine, Ser 0.68 0.40 - 1.50 mg/dL   GFR 110.71 >60.00 mL/min   Calcium 9.9 8.4 - 10.5 mg/dL  TSH     Status: None   Collection Time: 04/17/19 12:00 PM  Result Value Ref Range   TSH 2.13 0.35 - 4.50 uIU/mL  Hepatic function panel     Status: None   Collection Time: 04/17/19 12:00 PM  Result Value Ref Range   Total Bilirubin 0.9 0.2 - 1.2 mg/dL   Bilirubin, Direct 0.2 0.0 - 0.3 mg/dL   Alkaline Phosphatase 71 39 - 117 U/L   AST 18 0 - 37 U/L    ALT 19 0 - 53 U/L   Total Protein 6.2 6.0 - 8.3 g/dL   Albumin 4.3 3.5 - 5.2 g/dL  CBC with Differential/Platelet     Status: Abnormal   Collection Time: 04/17/19 12:00 PM  Result Value Ref Range   WBC 11.7 (H) 4.0 - 10.5 K/uL   RBC 5.17 4.22 - 5.81 Mil/uL   Hemoglobin 16.1 13.0 - 17.0 g/dL   HCT 48.7 39.0 - 52.0 %   MCV 94.1 78.0 - 100.0 fl   MCHC 33.1 30.0 - 36.0 g/dL   RDW 14.5  11.5 - 15.5 %   Platelets 218.0 150.0 - 400.0 K/uL   Neutrophils Relative % 69.4 43.0 - 77.0 %   Lymphocytes Relative 22.0 12.0 - 46.0 %   Monocytes Relative 7.6 3.0 - 12.0 %   Eosinophils Relative 0.8 0.0 - 5.0 %   Basophils Relative 0.2 0.0 - 3.0 %   Neutro Abs 8.1 (H) 1.4 - 7.7 K/uL   Lymphs Abs 2.6 0.7 - 4.0 K/uL   Monocytes Absolute 0.9 0.1 - 1.0 K/uL   Eosinophils Absolute 0.1 0.0 - 0.7 K/uL   Basophils Absolute 0.0 0.0 - 0.1 K/uL  POCT Glucose (CBG)     Status: Normal   Collection Time: 07/03/19  9:28 AM  Result Value Ref Range   POC Glucose 87 70 - 99 mg/dl    Assessment/Plan: 1. Orthostatic hypotension 2. Pre-syncope Symptoms present x 2 months. BP labile on current regimen. Has been consistent with diet but suspect fluid intake could be improved. This has been encouraged and he endorses he will work on this. OVS + today with lightheadedness on standing. Will stop Atenolol and switch to Metoprolol XL in hopes of controlling BP and keeping it more labile throughout the day as is longer-acting. Will start 20/30 compression stockings to help prevent such a drop in BP with standing. Supportive measures reviewed. Will check CBC and CMP today to make sure no other contributing factors. Follow-up with myself or PCP in 7-10 days. He is to bring BP log to that appointment.  - CBC with Differential/Platelet - Comprehensive metabolic panel  This visit occurred during the SARS-CoV-2 public health emergency.  Safety protocols were in place, including screening questions prior to the visit, additional  usage of staff PPE, and extensive cleaning of exam room while observing appropriate contact time as indicated for disinfecting solutions.     Leeanne Rio, PA-C

## 2019-07-03 NOTE — Patient Instructions (Signed)
Mr. [name] , Thank you for taking time to come for your Medicare Wellness Visit. I appreciate your ongoing commitment to your health goals. Please review the following plan we discussed and let me know if I can assist you in the future.   Screening recommendations/referrals: Colorectal Screening: up to date; last colonoscopy 01/07/12 (no longer indicated)   Vision and Dental Exams: Recommended annual ophthalmology exams for early detection of glaucoma and other disorders of the eye Recommended annual dental exams for proper oral hygiene  Vaccinations: Influenza vaccine: completed 11/07/18 Pneumococcal vaccine: up to date; last 10/21/15 Tdap vaccine: up to date; last 05/19/19   Shingles vaccine:Shingrix completed  Covid vaccine: Completed   Advanced directives: Please bring a copy of your POA (Power of [place]) and/or Living Will to your next appointment.  Goals: Recommend to drink at least 6-8 8oz glasses of water per day and consume a balanced diet rich in fresh fruits and vegetables.   Next appointment: Please schedule your Annual Wellness Visit with your Nurse Health Advisor in one year.  Preventive Care 35 Years and Older, Male Preventive care refers to lifestyle choices and visits with your health care provider that can promote health and wellness. What does preventive care include?  A yearly physical exam. This is also called an annual well check.  Dental exams once or twice a year.  Routine eye exams. Ask your health care provider how often you should have your eyes checked.  Personal lifestyle choices, including:  Daily care of your teeth and gums.  Regular physical activity.  Eating a healthy diet.  Avoiding tobacco and drug use.  Limiting alcohol use.  Practicing safe sex.  Taking low doses of aspirin every day if recommended by your health care provider..  Taking vitamin and mineral supplements as recommended by your health care provider. What happens during an  annual well check? The services and screenings done by your health care provider during your annual well check will depend on your age, overall health, lifestyle risk factors, and family history of disease. Counseling  Your health care provider may ask you questions about your:  Alcohol use.  Tobacco use.  Drug use.  Emotional well-being.  Home and relationship well-being.  Sexual activity.  Eating habits.  History of falls.  Memory and ability to understand (cognition).  Work and work Statistician. Screening  You may have the following tests or measurements:  Height, weight, and BMI.  Blood pressure.  Lipid and cholesterol levels. These may be checked every 5 years, or more frequently if you are over 60 years old.  Skin check.  Lung cancer screening. You may have this screening every year starting at age 11 if you have a 30-pack-year history of smoking and currently smoke or have quit within the past 15 years.  Fecal occult blood test (FOBT) of the stool. You may have this test every year starting at age 65.  Flexible sigmoidoscopy or colonoscopy. You may have a sigmoidoscopy every 5 years or a colonoscopy every 10 years starting at age 29.  Prostate cancer screening. Recommendations will vary depending on your family history and other risks.  Hepatitis C blood test.  Hepatitis B blood test.  Sexually transmitted disease (STD) testing.  Diabetes screening. This is done by checking your blood sugar (glucose) after you have not eaten for a while (fasting). You may have this done every 1-3 years.  Abdominal aortic aneurysm (AAA) screening. You may need this if you are a current or former  smoker.  Osteoporosis. You may be screened starting at age 66 if you are at high risk. Talk with your health care provider about your test results, treatment options, and if necessary, the need for more tests. Vaccines  Your health care provider may recommend certain vaccines,  such as:  Influenza vaccine. This is recommended every year.  Tetanus, diphtheria, and acellular pertussis (Tdap, Td) vaccine. You may need a Td booster every 10 years.  Zoster vaccine. You may need this after age 31.  Pneumococcal 13-valent conjugate (PCV13) vaccine. One dose is recommended after age 6.  Pneumococcal polysaccharide (PPSV23) vaccine. One dose is recommended after age 52. Talk to your health care provider about which screenings and vaccines you need and how often you need them. This information is not intended to replace advice given to you by your health care provider. Make sure you discuss any questions you have with your health care provider. Document Released: 03/13/2015 Document Revised: 11/04/2015 Document Reviewed: 12/16/2014 Elsevier Interactive Patient Education  2017 [place] Prevention in the Home Falls can cause injuries. They can happen to people of all ages. There are many things you can do to make your home safe and to help prevent falls. What can I do on the outside of my home?  Regularly fix the edges of walkways and driveways and fix any cracks.  Remove anything that might make you trip as you walk through a door, such as a raised step or threshold.  Trim any bushes or trees on the path to your home.  Use bright outdoor lighting.  Clear any walking paths of anything that might make someone trip, such as rocks or tools.  Regularly check to see if handrails are loose or broken. Make sure that both sides of any steps have handrails.  Any raised decks and porches should have guardrails on the edges.  Have any leaves, snow, or ice cleared regularly.  Use sand or salt on walking paths during winter.  Clean up any spills in your garage right away. This includes oil or grease spills. What can I do in the bathroom?  Use night lights.  Install grab bars by the toilet and in the tub and shower. Do not use towel bars as grab bars.  Use  non-skid mats or decals in the tub or shower.  If you need to sit down in the shower, use a plastic, non-slip stool.  Keep the floor dry. Clean up any water that spills on the floor as soon as it happens.  Remove soap buildup in the tub or shower regularly.  Attach bath mats securely with double-sided non-slip rug tape.  Do not have throw rugs and other things on the floor that can make you trip. What can I do in the bedroom?  Use night lights.  Make sure that you have a light by your bed that is easy to reach.  Do not use any sheets or blankets that are too big for your bed. They should not hang down onto the floor.  Have a firm chair that has side arms. You can use this for support while you get dressed.  Do not have throw rugs and other things on the floor that can make you trip. What can I do in the kitchen?  Clean up any spills right away.  Avoid walking on wet floors.  Keep items that you use a lot in easy-to-reach places.  If you need to reach something above you, use a  strong step stool that has a grab bar.  Keep electrical cords out of the way.  Do not use floor polish or wax that makes floors slippery. If you must use wax, use non-skid floor wax.  Do not have throw rugs and other things on the floor that can make you trip. What can I do with my stairs?  Do not leave any items on the stairs.  Make sure that there are handrails on both sides of the stairs and use them. Fix handrails that are broken or loose. Make sure that handrails are as long as the stairways.  Check any carpeting to make sure that it is firmly attached to the stairs. Fix any carpet that is loose or worn.  Avoid having throw rugs at the top or bottom of the stairs. If you do have throw rugs, attach them to the floor with carpet tape.  Make sure that you have a light switch at the top of the stairs and the bottom of the stairs. If you do not have them, ask someone to add them for you. What  else can I do to help prevent falls?  Wear shoes that:  Do not have high heels.  Have rubber bottoms.  Are comfortable and fit you well.  Are closed at the toe. Do not wear sandals.  If you use a stepladder:  Make sure that it is fully opened. Do not climb a closed stepladder.  Make sure that both sides of the stepladder are locked into place.  Ask someone to hold it for you, if possible.  Clearly mark and make sure that you can see:  Any grab bars or handrails.  First and last steps.  Where the edge of each step is.  Use tools that help you move around (mobility aids) if they are needed. These include:  Canes.  Walkers.  Scooters.  Crutches.  Turn on the lights when you go into a dark area. Replace any light bulbs as soon as they burn out.  Set up your furniture so you have a clear path. Avoid moving your furniture around.  If any of your floors are uneven, fix them.  If there are any pets around you, be aware of where they are.  Review your medicines with your doctor. Some medicines can make you feel dizzy. This can increase your chance of falling. Ask your doctor what other things that you can do to help prevent falls. This information is not intended to replace advice given to you by your health care provider. Make sure you discuss any questions you have with your health care provider. Document Released: 12/11/2008 Document Revised: 07/23/2015 Document Reviewed: 03/21/2014 Elsevier Interactive Patient Education  2017 [employer].

## 2019-07-04 ENCOUNTER — Other Ambulatory Visit: Payer: Self-pay

## 2019-07-04 DIAGNOSIS — I951 Orthostatic hypotension: Secondary | ICD-10-CM

## 2019-07-12 ENCOUNTER — Other Ambulatory Visit: Payer: Self-pay

## 2019-07-12 ENCOUNTER — Encounter: Payer: Self-pay | Admitting: Family Medicine

## 2019-07-12 ENCOUNTER — Ambulatory Visit (INDEPENDENT_AMBULATORY_CARE_PROVIDER_SITE_OTHER): Payer: Medicare Other | Admitting: Family Medicine

## 2019-07-12 VITALS — BP 128/82 | HR 79 | Temp 98.7°F | Resp 16 | Ht 72.0 in | Wt 125.5 lb

## 2019-07-12 DIAGNOSIS — D72829 Elevated white blood cell count, unspecified: Secondary | ICD-10-CM | POA: Diagnosis not present

## 2019-07-12 DIAGNOSIS — I10 Essential (primary) hypertension: Secondary | ICD-10-CM | POA: Diagnosis not present

## 2019-07-12 DIAGNOSIS — I951 Orthostatic hypotension: Secondary | ICD-10-CM | POA: Insufficient documentation

## 2019-07-12 LAB — CBC WITH DIFFERENTIAL/PLATELET
Absolute Monocytes: 660 cells/uL (ref 200–950)
Basophils Absolute: 37 cells/uL (ref 0–200)
Basophils Relative: 0.4 %
Eosinophils Absolute: 233 cells/uL (ref 15–500)
Eosinophils Relative: 2.5 %
HCT: 42.7 % (ref 38.5–50.0)
Hemoglobin: 14 g/dL (ref 13.2–17.1)
Lymphs Abs: 1581 cells/uL (ref 850–3900)
MCH: 30.9 pg (ref 27.0–33.0)
MCHC: 32.8 g/dL (ref 32.0–36.0)
MCV: 94.3 fL (ref 80.0–100.0)
MPV: 12 fL (ref 7.5–12.5)
Monocytes Relative: 7.1 %
Neutro Abs: 6789 cells/uL (ref 1500–7800)
Neutrophils Relative %: 73 %
Platelets: 233 10*3/uL (ref 140–400)
RBC: 4.53 10*6/uL (ref 4.20–5.80)
RDW: 12.7 % (ref 11.0–15.0)
Total Lymphocyte: 17 %
WBC: 9.3 10*3/uL (ref 3.8–10.8)

## 2019-07-12 NOTE — Assessment & Plan Note (Signed)
Improved since switching to Metoprolol.  Wears compression socks at home.  Is eating and drinking better than previously.  Feeling less dizzy.

## 2019-07-12 NOTE — Progress Notes (Signed)
   Subjective:    Patient ID: [name], male    DOB: 1931/12/01, 87 y.o.   MRN: [id number]  HPI HTN- chronic problem.  Pt has had trouble w/ orthostatic hypotension.  At last visit Atenolol was switched to Metoprolol.  BP is better controlled today.  Pt feels less dizzy since switching the medicine.  No CP, SOB above baseline.  Rare visual changes.  No edema.  Wearing compression socks when home.  Pt reports feeling well w/ exception of fatigue.  Elevated WBC- was 17.5 at last visit.  Pt reports he was feeling well.  No fevers.  No recent cough or congestion.   Review of Systems For ROS see HPI   This visit occurred during the SARS-CoV-2 public health emergency.  Safety protocols were in place, including screening questions prior to the visit, additional usage of staff PPE, and extensive cleaning of exam room while observing appropriate contact time as indicated for disinfecting solutions.     Objective:   Physical Exam Vitals reviewed.  Constitutional:      General: He is not in acute distress.    Appearance: He is well-developed.     Comments: Cachectic appearing  HENT:     Head: Normocephalic and atraumatic.  Eyes:     Conjunctiva/sclera: Conjunctivae normal.     Pupils: Pupils are equal, round, and reactive to light.  Neck:     Thyroid: No thyromegaly.  Cardiovascular:     Rate and Rhythm: Normal rate and regular rhythm.     Heart sounds: Normal heart sounds. No murmur.  Pulmonary:     Effort: Pulmonary effort is normal. No respiratory distress.     Breath sounds: Normal breath sounds.  Abdominal:     General: Bowel sounds are normal. There is no distension.     Palpations: Abdomen is soft.  Musculoskeletal:     Cervical back: Normal range of motion and neck supple.  Lymphadenopathy:     Cervical: No cervical adenopathy.  Skin:    General: Skin is warm and dry.  Neurological:     Mental Status: He is alert and oriented to person, place, and time.     Cranial  Nerves: No cranial nerve deficit.  Psychiatric:        Behavior: Behavior normal.           Assessment & Plan:  Leukocytosis- WBC was 17.5 at last check.  Pt is asymptomatic but will repeat today to determine if further w/u is needed

## 2019-07-12 NOTE — Patient Instructions (Signed)
Follow up as needed or as scheduled We'll notify you of your lab results and make any changes if needed CONTINUE the Metoprolol daily WEAR the compression socks when you are home Make sure you are eating and drinking regularly Continue to stand up and change positions slowly Call with any questions or concerns Have a great weekend!

## 2019-07-12 NOTE — Assessment & Plan Note (Signed)
Chronic problem.  BP is better controlled today since switching to Metoprolol.  Pt appears better than last visit.  No changes at this time.  Will follow.

## 2019-07-15 ENCOUNTER — Ambulatory Visit: Payer: Medicare Other | Admitting: Family Medicine

## 2019-07-23 ENCOUNTER — Encounter: Payer: Self-pay | Admitting: Family Medicine

## 2019-07-25 ENCOUNTER — Encounter: Payer: Self-pay | Admitting: Family Medicine

## 2019-07-25 ENCOUNTER — Other Ambulatory Visit: Payer: Self-pay

## 2019-07-25 ENCOUNTER — Ambulatory Visit (INDEPENDENT_AMBULATORY_CARE_PROVIDER_SITE_OTHER): Payer: Medicare Other | Admitting: Family Medicine

## 2019-07-25 VITALS — BP 126/86 | HR 83 | Temp 97.4°F | Resp 17

## 2019-07-25 DIAGNOSIS — R42 Dizziness and giddiness: Secondary | ICD-10-CM | POA: Diagnosis not present

## 2019-07-25 DIAGNOSIS — R350 Frequency of micturition: Secondary | ICD-10-CM | POA: Diagnosis not present

## 2019-07-25 DIAGNOSIS — R8290 Unspecified abnormal findings in urine: Secondary | ICD-10-CM

## 2019-07-25 LAB — POCT URINALYSIS DIPSTICK
Bilirubin, UA: NEGATIVE
Blood, UA: NEGATIVE
Glucose, UA: NEGATIVE
Ketones, UA: NEGATIVE
Leukocytes, UA: NEGATIVE
Nitrite, UA: NEGATIVE
Protein, UA: NEGATIVE
Spec Grav, UA: 1.015 (ref 1.010–1.025)
Urobilinogen, UA: 0.2 E.U./dL
pH, UA: 6 (ref 5.0–8.0)

## 2019-07-25 MED ORDER — VALSARTAN 80 MG PO TABS: 80.0000 mg | ORAL_TABLET | Freq: Every day | ORAL | 3 refills | Status: DC

## 2019-07-25 NOTE — Progress Notes (Signed)
   Subjective:    Patient ID: [name], male    DOB: March 28, 1931, 86 y.o.   MRN: [id number]  HPI Urinary frequency- family describes urine as 'a normal color'.  No burning w/ urination.  No blood in urine.  Dizzy- pt reports he has been dizzy 'for a couple of weeks'.  Doesn't walk around the house w/o family support and using his cane.  He will use a urinal if home by himself.  Pt only has dizziness upon standing.  Pt reports good water intake.  Family reports he is eating regularly.  Denies CP, SOB above baseline, no edema.  No abd pain   Review of Systems For ROS see HPI   This visit occurred during the SARS-CoV-2 public health emergency.  Safety protocols were in place, including screening questions prior to the visit, additional usage of staff PPE, and extensive cleaning of exam room while observing appropriate contact time as indicated for disinfecting solutions.       Objective:   Physical Exam Vitals reviewed.  Constitutional:      General: He is not in acute distress.    Comments: Frail elderly man  HENT:     Head: Normocephalic and atraumatic.  Cardiovascular:     Rate and Rhythm: Normal rate and regular rhythm.     Pulses: Normal pulses.  Pulmonary:     Effort: Pulmonary effort is normal. No respiratory distress.     Breath sounds: Normal breath sounds. No wheezing or rhonchi.  Skin:    General: Skin is warm and dry.  Neurological:     Mental Status: He is alert. Mental status is at baseline.  Psychiatric:        Mood and Affect: Mood normal.        Behavior: Behavior normal.          Assessment & Plan:  Urinary frequency- pt is not having sxs of UTI other than frequency and some recent dizziness.  Will send urine for culture prior to starting abx.  Dizziness- new.  Sxs started 'a few weeks ago' and only occur when standing.  BP is adequately controlled on Metoprolol but I suspect this is contributing to orthostatic hypotension as his HR can't speed up  enough to compensate.  Will stop Metoprolol and start Valsartan.  Pt expressed understanding and is in agreement w/ plan.

## 2019-07-25 NOTE — Patient Instructions (Signed)
Follow up in 3-4 weeks to recheck blood pressure STOP the Metoprolol START the Valsartan once daily Spot check your blood pressure at home and let me know if it's running too high or too low Call with any questions or concerns Hang in there!!

## 2019-07-26 ENCOUNTER — Ambulatory Visit: Payer: Medicare Other | Admitting: Family Medicine

## 2019-07-26 LAB — URINE CULTURE
MICRO NUMBER:: 10527593
SPECIMEN QUALITY:: ADEQUATE

## 2019-08-10 ENCOUNTER — Other Ambulatory Visit: Payer: Self-pay | Admitting: Family Medicine

## 2019-08-12 ENCOUNTER — Telehealth: Payer: Self-pay

## 2019-08-12 ENCOUNTER — Other Ambulatory Visit: Payer: Self-pay | Admitting: Family Medicine

## 2019-08-12 NOTE — Telephone Encounter (Signed)
Submitted patient to [employer] portal to verify coverage for Prolia  

## 2019-08-15 ENCOUNTER — Ambulatory Visit (INDEPENDENT_AMBULATORY_CARE_PROVIDER_SITE_OTHER): Payer: Medicare Other | Admitting: Family Medicine

## 2019-08-15 ENCOUNTER — Encounter: Payer: Self-pay | Admitting: Family Medicine

## 2019-08-15 ENCOUNTER — Other Ambulatory Visit: Payer: Self-pay

## 2019-08-15 VITALS — BP 119/74 | HR 112 | Temp 97.5°F | Resp 16 | Ht 72.0 in | Wt 125.2 lb

## 2019-08-15 DIAGNOSIS — I10 Essential (primary) hypertension: Secondary | ICD-10-CM | POA: Diagnosis not present

## 2019-08-15 DIAGNOSIS — R5383 Other fatigue: Secondary | ICD-10-CM

## 2019-08-15 LAB — HEPATIC FUNCTION PANEL
ALT: 22 U/L (ref 0–53)
AST: 17 U/L (ref 0–37)
Albumin: 3.8 g/dL (ref 3.5–5.2)
Alkaline Phosphatase: 72 U/L (ref 39–117)
Bilirubin, Direct: 0.2 mg/dL (ref 0.0–0.3)
Total Bilirubin: 0.7 mg/dL (ref 0.2–1.2)
Total Protein: 5.5 g/dL — ABNORMAL LOW (ref 6.0–8.3)

## 2019-08-15 LAB — CBC WITH DIFFERENTIAL/PLATELET
Basophils Absolute: 0 10*3/uL (ref 0.0–0.1)
Basophils Relative: 0.2 % (ref 0.0–3.0)
Eosinophils Absolute: 0.2 10*3/uL (ref 0.0–0.7)
Eosinophils Relative: 1.4 % (ref 0.0–5.0)
HCT: 44.6 % (ref 39.0–52.0)
Hemoglobin: 14.6 g/dL (ref 13.0–17.0)
Lymphocytes Relative: 20 % (ref 12.0–46.0)
Lymphs Abs: 2.2 10*3/uL (ref 0.7–4.0)
MCHC: 32.7 g/dL (ref 30.0–36.0)
MCV: 95.4 fl (ref 78.0–100.0)
Monocytes Absolute: 0.9 10*3/uL (ref 0.1–1.0)
Monocytes Relative: 7.8 % (ref 3.0–12.0)
Neutro Abs: 7.9 10*3/uL — ABNORMAL HIGH (ref 1.4–7.7)
Neutrophils Relative %: 70.6 % (ref 43.0–77.0)
Platelets: 202 10*3/uL (ref 150.0–400.0)
RBC: 4.68 Mil/uL (ref 4.22–5.81)
RDW: 14.9 % (ref 11.5–15.5)
WBC: 11.2 10*3/uL — ABNORMAL HIGH (ref 4.0–10.5)

## 2019-08-15 LAB — BASIC METABOLIC PANEL
BUN: 19 mg/dL (ref 6–23)
CO2: 25 mEq/L (ref 19–32)
Calcium: 9.2 mg/dL (ref 8.4–10.5)
Chloride: 106 mEq/L (ref 96–112)
Creatinine, Ser: 0.81 mg/dL (ref 0.40–1.50)
GFR: 90.4 mL/min (ref >60.00)
Glucose, Bld: 178 mg/dL — ABNORMAL HIGH (ref 70–99)
Potassium: 4.4 mEq/L (ref 3.5–5.1)
Sodium: 140 mEq/L (ref 135–145)

## 2019-08-15 LAB — TSH: TSH: 1.84 u[IU]/mL (ref 0.35–4.50)

## 2019-08-15 NOTE — Patient Instructions (Addendum)
Follow up in 6-8 weeks to recheck blood pressure and fatigue We'll notify you of your lab results and make any changes if needed DECREASE the Valsartan to 1/2 tab daily (40mg ) Continue to drink plenty of water and eat regularly Call with any questions or concerns Hang in there!!!

## 2019-08-15 NOTE — Progress Notes (Signed)
° °  Subjective:    Patient ID: [name], male    DOB: [birth year], 86 y.o.   MRN: [id number]  HPI HTN- At last visit we stopped pt's Metoprolol and started Valsartan 80mg  daily due to Orthostatic dizziness.  Pt reports some improvement in dizziness since medication change.  Pt reports feeling tired and weak.  No CP.  SOB at baseline.   Review of Systems For ROS see HPI   This visit occurred during the SARS-CoV-2 public health emergency.  Safety protocols were in place, including screening questions prior to the visit, additional usage of staff PPE, and extensive cleaning of exam room while observing appropriate contact time as indicated for disinfecting solutions.       Objective:   Physical Exam Vitals reviewed.  Constitutional:      General: He is not in acute distress.    Appearance: He is well-developed.  HENT:     Head: Normocephalic and atraumatic.  Eyes:     Conjunctiva/sclera: Conjunctivae normal.     Pupils: Pupils are equal, round, and reactive to light.  Neck:     Thyroid: No thyromegaly.  Cardiovascular:     Rate and Rhythm: Normal rate and regular rhythm.     Heart sounds: Normal heart sounds. No murmur heard.   Pulmonary:     Effort: Pulmonary effort is normal. No respiratory distress.     Breath sounds: Normal breath sounds.  Abdominal:     General: Bowel sounds are normal. There is no distension.     Palpations: Abdomen is soft.  Musculoskeletal:     Cervical back: Normal range of motion and neck supple.  Lymphadenopathy:     Cervical: No cervical adenopathy.  Skin:    General: Skin is warm and dry.  Neurological:     Mental Status: He is alert and oriented to person, place, and time.     Cranial Nerves: No cranial nerve deficit.  Psychiatric:        Behavior: Behavior normal.           Assessment & Plan:  Fatigue- likely multifactorial (chronic medical issues, medication, age).  Will check labs to r/o metabolic cause.  Will decrease Valsartan  to limit possible orthostatic hypotension

## 2019-08-15 NOTE — Assessment & Plan Note (Signed)
Pt's home BPs after medication are 125/86, 87/63, 99/68, and 136/77.  Since he still has intermittent dizziness and weakness will decrease Valsartan to 40mg  daily.  Pt expressed understanding and is in agreement w/ plan.

## 2019-08-16 ENCOUNTER — Other Ambulatory Visit: Payer: Self-pay

## 2019-08-16 DIAGNOSIS — M810 Age-related osteoporosis without current pathological fracture: Secondary | ICD-10-CM

## 2019-08-16 MED ORDER — ALENDRONATE SODIUM 70 MG PO TABS: 70.0000 mg | ORAL_TABLET | ORAL | 0 refills | Status: DC

## 2019-08-18 DIAGNOSIS — I10 Essential (primary) hypertension: Secondary | ICD-10-CM | POA: Diagnosis not present

## 2019-08-18 DIAGNOSIS — E782 Mixed hyperlipidemia: Secondary | ICD-10-CM | POA: Diagnosis not present

## 2019-08-30 NOTE — Telephone Encounter (Signed)
Summary of benefits states that the patient does not require a PA for Prolia. Pt also does not owe anything at all for medication or administration.  Patient can be scheduled any time after 10/03/19.

## 2019-09-04 ENCOUNTER — Other Ambulatory Visit: Payer: Self-pay

## 2019-09-04 MED ORDER — VALSARTAN 40 MG PO TABS: 40.0000 mg | ORAL_TABLET | Freq: Every day | ORAL | 3 refills | Status: DC

## 2019-09-16 ENCOUNTER — Other Ambulatory Visit: Payer: Self-pay | Admitting: Family Medicine

## 2019-09-26 ENCOUNTER — Telehealth: Payer: Self-pay | Admitting: Family Medicine

## 2019-09-26 NOTE — Telephone Encounter (Signed)
I have placed a disability placard in the bin upfront with a charge sheet.

## 2019-09-26 NOTE — Telephone Encounter (Signed)
Paperwork given to PCP for review.  

## 2019-09-27 NOTE — Telephone Encounter (Signed)
Form completed and placed in basket  

## 2019-09-27 NOTE — Telephone Encounter (Signed)
FYI

## 2019-09-27 NOTE — Telephone Encounter (Signed)
Picked up from the back mailed and made pt aware

## 2019-10-15 ENCOUNTER — Ambulatory Visit (INDEPENDENT_AMBULATORY_CARE_PROVIDER_SITE_OTHER): Payer: Medicare Other

## 2019-10-15 ENCOUNTER — Other Ambulatory Visit: Payer: Self-pay

## 2019-10-15 DIAGNOSIS — M810 Age-related osteoporosis without current pathological fracture: Secondary | ICD-10-CM | POA: Diagnosis not present

## 2019-10-15 MED ORDER — DENOSUMAB 60 MG/ML ~~LOC~~ SOSY
60.0000 mg | PREFILLED_SYRINGE | Freq: Once | SUBCUTANEOUS | Status: AC
  Administered 2019-10-15: 60 mg via SUBCUTANEOUS

## 2019-10-15 NOTE — Progress Notes (Signed)
Pt presents today for nurse visit for an injection. Per Dr. Loanne Drilling, injection of Prolia given SQ in L arm today by A. Cipriano Millikan, LPN. Denies any adverse reactions or discomfort.

## 2019-10-16 ENCOUNTER — Telehealth (INDEPENDENT_AMBULATORY_CARE_PROVIDER_SITE_OTHER): Payer: Medicare Other | Admitting: Family Medicine

## 2019-10-16 ENCOUNTER — Encounter: Payer: Self-pay | Admitting: Family Medicine

## 2019-10-16 ENCOUNTER — Other Ambulatory Visit: Payer: Self-pay

## 2019-10-16 VITALS — BP 138/82 | HR 79

## 2019-10-16 DIAGNOSIS — E785 Hyperlipidemia, unspecified: Secondary | ICD-10-CM

## 2019-10-16 DIAGNOSIS — I10 Essential (primary) hypertension: Secondary | ICD-10-CM | POA: Diagnosis not present

## 2019-10-16 NOTE — Progress Notes (Signed)
I have discussed the procedure for the virtual visit with the patient who has given consent to proceed with assessment and treatment.   Carlyann Placide L Bradon Fester, CMA     

## 2019-10-16 NOTE — Progress Notes (Signed)
Virtual Visit via Video   I connected with patient on 10/16/19 at 11:30 AM EDT by a video enabled telemedicine application and verified that I am speaking with the correct person using two identifiers.  Location patient: Home Location provider: Acupuncturist, Office Persons participating in the virtual visit: Patient, Provider, [place] (Jess B)  I discussed the limitations of evaluation and management by telemedicine and the availability of in person appointments. The patient expressed understanding and agreed to proceed.  Subjective:   HPI:   HTN- chronic problem, on Valsartan 40mg  daily w/ good control.  No CP, SOB above baseline, HAs, visual changes.  Hyperlipidemia- chronic problem, on Lipitor 20mg  daily.  No abd pain, N/V  ROS:   See pertinent positives and negatives per HPI.  Patient Active Problem List   Diagnosis Date Noted  . Orthostatic hypotension 07/12/2019  . Protein-calorie malnutrition ([place]) 09/26/2018  . Urinary retention 05/30/2018  . Fall   . Syncope 05/26/2018  . Thyroid nodule 04/12/2018  . Screening for malignant neoplasm of prostate 10/08/2014  . Facial skin lesion 04/08/2014  . HTN (hypertension) 10/15/2013  . Osteoporosis 10/15/2013  . History of CVA (cerebrovascular accident) 10/15/2013  . History of compression fracture of spine 10/15/2013  . Hyperlipidemia 10/15/2013  . Low testosterone 10/15/2013  . Carotid stenosis 10/15/2013    Social History   Tobacco Use  . Smoking status: Former Smoker    Packs/day: 2.00    Years: 20.00    Pack years: 40.00    Types: Cigarettes    Quit date: 02/29/1988    Years since quitting: 31.6  . Smokeless tobacco: Never Used  Substance Use Topics  . Alcohol use: No    Alcohol/week: 0.0 standard drinks    Current Outpatient Medications:  .  acetaminophen (TYLENOL) 500 MG tablet, Take 500-1,000 mg by mouth every 8 (eight) hours as needed for mild pain or headache. , Disp: , Rfl:  .  alendronate  (FOSAMAX) 70 MG tablet, Take 1 tablet (70 mg total) by mouth every Friday. take with a full glass of water on an empty stomach, Disp: 12 tablet, Rfl: 0 .  aspirin EC 81 MG tablet, Take 81 mg by mouth daily., Disp: , Rfl:  .  atorvastatin (LIPITOR) 20 MG tablet, TAKE ONE TABLET BY MOUTH DAILY, Disp: 90 tablet, Rfl: 0 .  calcium-vitamin D (OSCAL WITH D) 500-200 MG-UNIT tablet, Take 1 tablet by mouth 2 (two) times daily., Disp: , Rfl:  .  Cholecalciferol (VITAMIN D-3) 1000 units CAPS, Take 1,000 Units by mouth 2 (two) times a day. , Disp: , Rfl:  .  denosumab (PROLIA) 60 MG/ML SOSY injection, Inject 60 mg into the skin every 6 (six) months., Disp: , Rfl:  .  feeding supplement, ENSURE ENLIVE, (ENSURE ENLIVE) LIQD, Take 237 mLs by mouth 3 (three) times daily between meals. (Patient taking differently: Take 237 mLs by mouth 2 (two) times daily between meals. ), Disp: 237 mL, Rfl: 12 .  FLOVENT HFA 110 MCG/ACT inhaler, USE 2 INHALATIONS TWICE A DAY, Disp: 36 g, Rfl: 3 .  ipratropium (ATROVENT HFA) 17 MCG/ACT inhaler, Inhale 2 puffs into the lungs every 6 (six) hours as needed for wheezing., Disp: 3 Inhaler, Rfl: 3 .  Multiple Vitamin (MULTIVITAMIN WITH MINERALS) TABS tablet, Take 1 tablet by mouth daily., Disp: 30 tablet, Rfl: 1 .  Polyethyl Glycol-Propyl Glycol (SYSTANE) 0.4-0.3 % SOLN, Place 1 drop into both eyes 3 (three) times daily as needed (dry eyes). , Disp: ,  Rfl:  .  tamsulosin (FLOMAX) 0.4 MG CAPS capsule, TAKE ONE CAPSULE BY MOUTH DAILY, Disp: 90 capsule, Rfl: 0 .  traMADol (ULTRAM) 50 MG tablet, Take 50 mg by mouth every 6 (six) hours as needed for moderate pain. , Disp: , Rfl:  .  valsartan (DIOVAN) 40 MG tablet, Take 1 tablet (40 mg total) by mouth daily., Disp: 30 tablet, Rfl: 3  No Known Allergies  Objective:   BP 138/82 Comment: 111/74 after meds  Pulse 79  Pt is able to speak clearly, coherently without shortness of breath or increased work of breathing. Thought process is linear.   Mood is appropriate.   Assessment and Plan:   HTN- chronic problem.  Currently well controlled.  Asymptomatic.  Check labs.  No anticipated med changes.  Will follow.  Hyperlipidemia- chronic problem, tolerating statin w/o difficulty.  Check labs.  Adjust meds prn    Annye Asa, MD 10/16/2019  Time spent with the patient: 8 minutes, of which >50% was spent in obtaining information about symptoms, reviewing previous labs, evaluations, and treatments, counseling about condition (please see the discussed topics above), and developing a plan to further investigate it; had a number of questions which I addressed.

## 2019-10-22 ENCOUNTER — Ambulatory Visit (INDEPENDENT_AMBULATORY_CARE_PROVIDER_SITE_OTHER): Payer: Medicare Other

## 2019-10-22 ENCOUNTER — Other Ambulatory Visit: Payer: Self-pay

## 2019-10-22 DIAGNOSIS — I10 Essential (primary) hypertension: Secondary | ICD-10-CM | POA: Diagnosis not present

## 2019-10-22 DIAGNOSIS — E785 Hyperlipidemia, unspecified: Secondary | ICD-10-CM | POA: Diagnosis not present

## 2019-10-22 LAB — CBC WITH DIFFERENTIAL/PLATELET
Basophils Absolute: 0 10*3/uL (ref 0.0–0.1)
Basophils Relative: 0.2 % (ref 0.0–3.0)
Eosinophils Absolute: 0.3 10*3/uL (ref 0.0–0.7)
Eosinophils Relative: 3 % (ref 0.0–5.0)
HCT: 44.8 % (ref 39.0–52.0)
Hemoglobin: 15 g/dL (ref 13.0–17.0)
Lymphocytes Relative: 26.2 % (ref 12.0–46.0)
Lymphs Abs: 2.8 10*3/uL (ref 0.7–4.0)
MCHC: 33.5 g/dL (ref 30.0–36.0)
MCV: 93.8 fl (ref 78.0–100.0)
Monocytes Absolute: 0.9 10*3/uL (ref 0.1–1.0)
Monocytes Relative: 8.2 % (ref 3.0–12.0)
Neutro Abs: 6.6 10*3/uL (ref 1.4–7.7)
Neutrophils Relative %: 62.4 % (ref 43.0–77.0)
Platelets: 217 10*3/uL (ref 150.0–400.0)
RBC: 4.77 Mil/uL (ref 4.22–5.81)
RDW: 14.8 % (ref 11.5–15.5)
WBC: 10.6 10*3/uL — ABNORMAL HIGH (ref 4.0–10.5)

## 2019-10-22 LAB — BASIC METABOLIC PANEL
BUN: 14 mg/dL (ref 6–23)
CO2: 30 mEq/L (ref 19–32)
Calcium: 9.5 mg/dL (ref 8.4–10.5)
Chloride: 104 mEq/L (ref 96–112)
Creatinine, Ser: 0.69 mg/dL (ref 0.40–1.50)
GFR: 108.73 mL/min (ref >60.00)
Glucose, Bld: 97 mg/dL (ref 70–99)
Potassium: 4.7 mEq/L (ref 3.5–5.1)
Sodium: 142 mEq/L (ref 135–145)

## 2019-10-22 LAB — LIPID PANEL
Cholesterol: 96 mg/dL (ref 0–200)
HDL: 42.8 mg/dL (ref >39.00)
LDL Cholesterol: 43 mg/dL (ref 0–99)
NonHDL: 53.4
Total CHOL/HDL Ratio: 2
Triglycerides: 50 mg/dL (ref 0.0–149.0)
VLDL: 10 mg/dL (ref 0.0–40.0)

## 2019-10-22 LAB — TSH: TSH: 1.96 u[IU]/mL (ref 0.35–4.50)

## 2019-10-22 LAB — HEPATIC FUNCTION PANEL
ALT: 19 U/L (ref 0–53)
AST: 17 U/L (ref 0–37)
Albumin: 3.9 g/dL (ref 3.5–5.2)
Alkaline Phosphatase: 68 U/L (ref 39–117)
Bilirubin, Direct: 0.2 mg/dL (ref 0.0–0.3)
Total Bilirubin: 0.9 mg/dL (ref 0.2–1.2)
Total Protein: 5.8 g/dL — ABNORMAL LOW (ref 6.0–8.3)

## 2019-10-25 ENCOUNTER — Telehealth: Payer: Medicare Other

## 2019-10-25 ENCOUNTER — Other Ambulatory Visit: Payer: Self-pay | Admitting: Endocrinology

## 2019-10-25 DIAGNOSIS — M810 Age-related osteoporosis without current pathological fracture: Secondary | ICD-10-CM

## 2019-11-01 ENCOUNTER — Other Ambulatory Visit: Payer: Self-pay

## 2019-11-01 ENCOUNTER — Ambulatory Visit: Payer: Medicare Other

## 2019-11-01 DIAGNOSIS — I10 Essential (primary) hypertension: Secondary | ICD-10-CM

## 2019-11-01 NOTE — Progress Notes (Signed)
[facility] Care Management Pharmacy  Name: [name]  MRN: [id number] DOB: October 25, 1931  Chief Complaint/ HPI  [name],  86 y.o. , male presents for their Follow-Up CCM visit with the clinical pharmacist via telephone due to COVID-19 Pandemic.  Reports to be doing well, getting outside with son/grandson on walks, now that weather is cooler. BP at home ranges 120-140s/70s-80s. Has not had any dizziness or falls past couple of months.  Medications previously switched from atenolol to metoprolol to valsartan. Valsartan most recently decreased from 80 mg to 40 mg daily.  PCP : [name], MD  Their chronic conditions include: HTN, HLD, osteoporosis, COPD, CVA.  Patient Active Problem List   Diagnosis Date Noted  . Orthostatic hypotension 07/12/2019  . Protein-calorie malnutrition ([place]) 09/26/2018  . Urinary retention 05/30/2018  . Fall   . Syncope 05/26/2018  . Thyroid nodule 04/12/2018  . Screening for malignant neoplasm of prostate 10/08/2014  . Facial skin lesion 04/08/2014  . HTN (hypertension) 10/15/2013  . Osteoporosis 10/15/2013  . History of CVA (cerebrovascular accident) 10/15/2013  . History of compression fracture of spine 10/15/2013  . Hyperlipidemia 10/15/2013  . Low testosterone 10/15/2013  . Carotid stenosis 10/15/2013   Past Surgical History:  Procedure Laterality Date  . BACK SURGERY    . LAPAROSCOPIC APPENDECTOMY N/A 11/02/2013   Procedure: APPENDECTOMY LAPAROSCOPIC;  Surgeon: Donnie Mesa, MD;  Location: [facility];  Service: General;  Laterality: N/A;    Social History   Socioeconomic History  . Marital status: Widowed    Spouse name: Not on file  . Number of children: 3  . Years of education: Not on file  . Highest education level: Not on file  Occupational History  . Occupation: Retired  Tobacco Use  . Smoking status: Former Smoker    Packs/day: 2.00    Years: 20.00    Pack years: 40.00    Types: Cigarettes    Quit date: 02/29/1988      Years since quitting: 31.6  . Smokeless tobacco: Never Used  Vaping Use  . Vaping Use: Never used  Substance and Sexual Activity  . Alcohol use: No    Alcohol/week: 0.0 standard drinks  . Drug use: No  . Sexual activity: Not Currently  Other Topics Concern  . Not on file  Social History Narrative   Retired from [employer]    3 sons; 4 grandchildren    Doesn't drive    Social Determinants of Health   Financial Resource Strain:   . Difficulty of Paying Living Expenses: Not on file  Food Insecurity: No Food Insecurity  . Worried About Charity fundraiser in the Last Year: Never true  . Ran Out of Food in the Last Year: Never true  Transportation Needs: No Transportation Needs  . Lack of Transportation (Medical): No  . Lack of Transportation (Non-Medical): No  Physical Activity:   . Days of Exercise per Week: Not on file  . Minutes of Exercise per Session: Not on file  Stress:   . Feeling of Stress : Not on file  Social Connections:   . Frequency of Communication with Friends and Family: Not on file  . Frequency of Social Gatherings with Friends and Family: Not on file  . Attends Religious Services: Not on file  . Active Member of Clubs or Organizations: Not on file  . Attends Archivist Meetings: Not on file  . Marital Status: Not on file   Family History  Problem Relation Age of Onset  . Colon cancer Brother   . Heart disease Brother   . Cancer Father   . Colon cancer Brother   . Aneurysm Sister    No Known Allergies Patient Care Team    Relationship Specialty Notifications Start End  [name], MD PCP - General Family Medicine  10/23/13   Volanda Napoleon, MD Consulting Physician Oncology  10/08/14   Tanda Rockers, MD Consulting Physician Pulmonary Disease  10/21/15   Renato Shin, MD Consulting Physician Endocrinology  10/21/15   [facility]  Dermatology  8/85/02   Jodi Marble, MD Consulting Physician Otolaryngology   04/12/18   Monna Fam, MD Consulting Physician Ophthalmology  04/12/18   Madelin Rear, [facility] Pharmacist Pharmacist Admissions 06/06/19    Comment: phone number [phone]     Outpatient Encounter Medications as of 11/01/2019  Medication Sig Note  . acetaminophen (TYLENOL) 500 MG tablet Take 500-1,000 mg by mouth every 8 (eight) hours as needed for mild pain or headache.    . alendronate (FOSAMAX) 70 MG tablet TAKE 1 TABLET EVERY FRIDAY WITH A FULL GLASS OF WATER ON AN EMPTY STOMACH   . aspirin EC 81 MG tablet Take 81 mg by mouth daily.   [name] atorvastatin (LIPITOR) 20 MG tablet TAKE ONE TABLET BY MOUTH DAILY   . calcium-vitamin D (OSCAL WITH D) 500-200 MG-UNIT tablet Take 1 tablet by mouth 2 (two) times daily.   . Cholecalciferol (VITAMIN D-3) 1000 units CAPS Take 1,000 Units by mouth 2 (two) times a day.    . denosumab (PROLIA) 60 MG/ML SOSY injection Inject 60 mg into the skin every 6 (six) months. 05/27/2018: Next injection due May or June  . feeding supplement, ENSURE ENLIVE, (ENSURE ENLIVE) LIQD Take 237 mLs by mouth 3 (three) times daily between meals. (Patient taking differently: Take 237 mLs by mouth 2 (two) times daily between meals. )   . FLOVENT HFA 110 MCG/ACT inhaler USE 2 INHALATIONS TWICE A DAY   . ipratropium (ATROVENT HFA) 17 MCG/ACT inhaler Inhale 2 puffs into the lungs every 6 (six) hours as needed for wheezing.   . Multiple Vitamin (MULTIVITAMIN WITH MINERALS) TABS tablet Take 1 tablet by mouth daily.   [name]-Propyl Glycol (SYSTANE) 0.4-0.3 % SOLN Place 1 drop into both eyes 3 (three) times daily as needed (dry eyes).    . tamsulosin (FLOMAX) 0.4 MG CAPS capsule TAKE ONE CAPSULE BY MOUTH DAILY   . traMADol (ULTRAM) 50 MG tablet Take 50 mg by mouth every 6 (six) hours as needed for moderate pain.    . valsartan (DIOVAN) 40 MG tablet Take 1 tablet (40 mg total) by mouth daily.    No facility-administered encounter medications on file as of 11/01/2019.   Current  Diagnosis/Assessment: Goals Addressed            This Visit's Progress   . BP <140/90   On track    [place] (see longitudinal plan of care for additional care plan information)  Current Barriers:  . Current antihypertensive regimen: Valsartan 40 mg daily  . Last practice recorded BP readings:  BP Readings from Last 3 Encounters:  06/05/19 121/81  05/23/19 134/82  05/19/19 (!) 173/92   Pharmacist Clinical Goal(s):  [name] Over the next 180 days, patient will work with PharmD and providers to optimize antihypertensive regimen.  o Focus on preventing dizziness that may be caused from low blood pressure.   Interventions: . Inter-disciplinary care  team collaboration (see longitudinal plan of care) . Comprehensive medication review performed; medication list updated in the electronic medical record.  . BP Monitoring plan  Patient Self Care Activities:  . Document in log, and provide at future appointments. . Patient will focus on medication adherence by continuing current medication management.   Initial goal documentation.      Hypertension   BP goal < 140/90  BP Readings from Last 3 Encounters:  10/16/19 138/82  08/15/19 119/74  07/25/19 126/86   Pulse Readings from Last 3 Encounters:  10/16/19 79  08/15/19 (!) 112  07/25/19 83   Home Bps 120s-140s/70s-80s. Denies any dizziness, no falls recently.  Patient is currently controlled on the following medications:  . Valsartan 40 mg once daily   We discussed: home BP monitoring, diet/exercise.  Plan Continue current medications    Hyperlipidemia   LDL goal <70      Component Value Date/Time   CHOL 96 10/22/2019 0903   TRIG 50.0 10/22/2019 0903   HDL 42.80 10/22/2019 0903   CHOLHDL 2 10/22/2019 0903   VLDL 10.0 10/22/2019 0903   LDLCALC 43 10/22/2019 0903    Patient is currently at goal on the following medications:   Atorvastatin 40 mg daily  CVA hx. Denies any abnormal bruising, bleeding from  nose or gums or blood in urine or stool. Patient is currently controlled on the following medications:  . Aspirin 81 mg once daily   Plan Continue current medications.  COPD / Tobacco   Tobacco Status:  Social History   Tobacco Use  Smoking Status Former Smoker  . Packs/day: 2.00  . Years: 20.00  . Pack years: 40.00  . Types: Cigarettes  . Quit date: 02/29/1988  . Years since quitting: 31.6  Smokeless Tobacco Never Used   Patient is currently controlled on the following medications:   Flovent 110 mcg/act 2 puffs twice daily.  Atrovent 2 puffs every 6 hours as needed for wheezing.  Using maintenance inhaler regularly? No. Frequency of rescue inhaler use:  infrequently.  Plan Continue current medications   Osteoporosis   Patient is currently controlled on the following medications:   Prolia 60 mg every six months  Alendronate 70 mg once weekly  Calcium-vitamin d 500-200 mg, vitamin d 1000 units once daily   Plan Continue current medications  Vaccines   Reviewed and discussed patient's vaccination history.  Patient is up to date.   Immunization History  Administered Date(s) Administered  . Fluad Quad(high Dose 65+) 11/07/2018  . Influenza Split 11/28/2012  . Influenza, High Dose Seasonal PF 10/26/2016  . Influenza,inj,Quad PF,6+ Mos 11/27/2013, 11/03/2015, 10/16/2017  . PFIZER SARS-COV-2 Vaccination 03/21/2019, 04/11/2019  . Pneumococcal Conjugate-13 10/23/2013  . Pneumococcal Polysaccharide-23 10/21/2015  . Tdap 05/19/2019  . Zoster Recombinat (Shingrix) 05/23/2017, 07/31/2017   Will get covid booster and annual flu shot.  Plan  11/2019 covid booster, annual flu shot  Medication Management   Pt uses [facility] for all medications. Uses pill box? Yes.   Plan  Continue current medication management strategy.  Follow up: 6 month phone visit  Future Appointments  Date Time Provider [place]  11/05/2019  1:15 PM [facility]  04/02/2020  9:00 AM [name], MD [facility]  ______________ Visit Information SDOH (Social Determinants of Health) assessments performed: Yes.  Madelin Rear, Pharm.D., BCGP Clinical Pharmacist [employer] [place] [phone]

## 2019-11-01 NOTE — Patient Instructions (Signed)
Please review care plan below and call me at [phone] (direct line) with any questions!  Thank you, Edyth Gunnels., Clinical Pharmacist  Goals Addressed            This Visit's Progress   . BP <140/90   On track    [place] (see longitudinal plan of care for additional care plan information)  Current Barriers:  . Current antihypertensive regimen: Valsartan 40 mg daily  . Last practice recorded BP readings:  BP Readings from Last 3 Encounters:  06/05/19 121/81  05/23/19 134/82  05/19/19 (!) 173/92   Pharmacist Clinical Goal(s):  [name] Over the next 180 days, patient will work with PharmD and providers to optimize antihypertensive regimen.  o Focus on preventing dizziness that may be caused from low blood pressure.   Interventions: . Inter-disciplinary care team collaboration (see longitudinal plan of care) . Comprehensive medication review performed; medication list updated in the electronic medical record.  . BP Monitoring plan  Patient Self Care Activities:  . Document in log, and provide at future appointments. . Patient will focus on medication adherence by continuing current medication management.   Initial goal documentation.     The patient verbalized understanding of instructions provided today and agreed to receive a mailed copy of patient instruction and/or educational materials. Telephone follow up appointment with pharmacy team member scheduled for: See next appointment with "Care Management Staff" under "What's Next" below.   Madelin Rear, Pharm.D., BCGP Clinical Pharmacist [facility] [phone]  Hypertension, Adult High blood pressure (hypertension) is when the force of blood pumping through the arteries is too strong. The arteries are the blood vessels that carry blood from the heart throughout the body. Hypertension forces the heart to work harder to pump blood and may cause arteries to become narrow or stiff. Untreated or uncontrolled  hypertension can cause a heart attack, heart failure, a stroke, kidney disease, and other problems. A blood pressure reading consists of a higher number over a lower number. Ideally, your blood pressure should be below 120/80. The first ("top") number is called the systolic pressure. It is a measure of the pressure in your arteries as your heart beats. The second ("bottom") number is called the diastolic pressure. It is a measure of the pressure in your arteries as the heart relaxes. What are the causes? The exact cause of this condition is not known. There are some conditions that result in or are related to high blood pressure. What increases the risk? Some risk factors for high blood pressure are under your control. The following factors may make you more likely to develop this condition:  Smoking.  Having type 2 diabetes mellitus, high cholesterol, or both.  Not getting enough exercise or physical activity.  Being overweight.  Having too much fat, sugar, calories, or salt (sodium) in your diet.  Drinking too much alcohol. Some risk factors for high blood pressure may be difficult or impossible to change. Some of these factors include:  Having chronic kidney disease.  Having a family history of high blood pressure.  Age. Risk increases with age.  Race. You may be at higher risk if you are African American.  Gender. Men are at higher risk than women before age 64. After age 67, women are at higher risk than men.  Having obstructive sleep apnea.  Stress. What are the signs or symptoms? High blood pressure may not cause symptoms. Very high blood pressure (hypertensive crisis) may cause:  Headache.  Anxiety.  Shortness of breath.  Nosebleed.  Nausea and vomiting.  Vision changes.  Severe chest pain.  Seizures. How is this diagnosed? This condition is diagnosed by measuring your blood pressure while you are seated, with your arm resting on a flat surface, your legs  uncrossed, and your feet flat on the floor. The cuff of the blood pressure monitor will be placed directly against the skin of your upper arm at the level of your heart. It should be measured at least twice using the same arm. Certain conditions can cause a difference in blood pressure between your right and left arms. Certain factors can cause blood pressure readings to be lower or higher than normal for a short period of time:  When your blood pressure is higher when you are in a health care provider's office than when you are at home, this is called white coat hypertension. Most people with this condition do not need medicines.  When your blood pressure is higher at home than when you are in a health care provider's office, this is called masked hypertension. Most people with this condition may need medicines to control blood pressure. If you have a high blood pressure reading during one visit or you have normal blood pressure with other risk factors, you may be asked to:  Return on a different day to have your blood pressure checked again.  Monitor your blood pressure at home for 1 week or longer. If you are diagnosed with hypertension, you may have other blood or imaging tests to help your health care provider understand your overall risk for other conditions. How is this treated? This condition is treated by making healthy lifestyle changes, such as eating healthy foods, exercising more, and reducing your alcohol intake. Your health care provider may prescribe medicine if lifestyle changes are not enough to get your blood pressure under control, and if:  Your systolic blood pressure is above 130.  Your diastolic blood pressure is above 80. Your personal target blood pressure may vary depending on your medical conditions, your age, and other factors. Follow these instructions at home: Eating and drinking   Eat a diet that is high in fiber and potassium, and low in sodium, added sugar, and  fat. An example eating plan is called the DASH (Dietary Approaches to Stop Hypertension) diet. To eat this way: ? Eat plenty of fresh fruits and vegetables. Try to fill one half of your plate at each meal with fruits and vegetables. ? Eat whole grains, such as whole-wheat pasta, brown rice, or whole-grain bread. Fill about one fourth of your plate with whole grains. ? Eat or drink low-fat dairy products, such as skim milk or low-fat yogurt. ? Avoid fatty cuts of meat, processed or cured meats, and poultry with skin. Fill about one fourth of your plate with lean proteins, such as fish, chicken without skin, beans, eggs, or tofu. ? Avoid pre-made and processed foods. These tend to be higher in sodium, added sugar, and fat.  Reduce your daily sodium intake. Most people with hypertension should eat less than 1,500 mg of sodium a day.  Do not drink alcohol if: ? Your health care provider tells you not to drink. ? You are pregnant, may be pregnant, or are planning to become pregnant.  If you drink alcohol: ? Limit how much you use to:  0-1 drink a day for women.  0-2 drinks a day for men. ? Be aware of how much alcohol is in your drink. In  the [place] one drink equals one 12 oz bottle of beer (355 mL), one 5 oz glass of wine (148 mL), or one 1 oz glass of hard liquor (44 mL). Lifestyle   Work with your health care provider to maintain a healthy body weight or to lose weight. Ask what an ideal weight is for you.  Get at least 30 minutes of exercise most days of the week. Activities may include walking, swimming, or biking.  Include exercise to strengthen your muscles (resistance exercise), such as Pilates or lifting weights, as part of your weekly exercise routine. Try to do these types of exercises for 30 minutes at least 3 days a week.  Do not use any products that contain nicotine or tobacco, such as cigarettes, e-cigarettes, and chewing tobacco. If you need help quitting, ask your health  care provider.  Monitor your blood pressure at home as told by your health care provider.  Keep all follow-up visits as told by your health care provider. This is important. Medicines  Take over-the-counter and prescription medicines only as told by your health care provider. Follow directions carefully. Blood pressure medicines must be taken as prescribed.  Do not skip doses of blood pressure medicine. Doing this puts you at risk for problems and can make the medicine less effective.  Ask your health care provider about side effects or reactions to medicines that you should watch for. Contact a health care provider if you:  Think you are having a reaction to a medicine you are taking.  Have headaches that keep coming back (recurring).  Feel dizzy.  Have swelling in your ankles.  Have trouble with your vision. Get help right away if you:  Develop a severe headache or confusion.  Have unusual weakness or numbness.  Feel faint.  Have severe pain in your chest or abdomen.  Vomit repeatedly.  Have trouble breathing. Summary  Hypertension is when the force of blood pumping through your arteries is too strong. If this condition is not controlled, it may put you at risk for serious complications.  Your personal target blood pressure may vary depending on your medical conditions, your age, and other factors. For most people, a normal blood pressure is less than 120/80.  Hypertension is treated with lifestyle changes, medicines, or a combination of both. Lifestyle changes include losing weight, eating a healthy, low-sodium diet, exercising more, and limiting alcohol. This information is not intended to replace advice given to you by your health care provider. Make sure you discuss any questions you have with your health care provider. Document Revised: 10/25/2017 Document Reviewed: 10/25/2017 Elsevier Patient Education  2020 [employer].

## 2019-11-05 ENCOUNTER — Ambulatory Visit (INDEPENDENT_AMBULATORY_CARE_PROVIDER_SITE_OTHER): Payer: Medicare Other

## 2019-11-05 ENCOUNTER — Other Ambulatory Visit: Payer: Self-pay

## 2019-11-05 DIAGNOSIS — Z23 Encounter for immunization: Secondary | ICD-10-CM

## 2019-11-08 ENCOUNTER — Other Ambulatory Visit: Payer: Self-pay | Admitting: Endocrinology

## 2019-11-08 DIAGNOSIS — M810 Age-related osteoporosis without current pathological fracture: Secondary | ICD-10-CM

## 2019-11-15 ENCOUNTER — Other Ambulatory Visit: Payer: Self-pay | Admitting: Family Medicine

## 2019-11-15 MED ORDER — TAMSULOSIN HCL 0.4 MG PO CAPS: 0.4000 mg | ORAL_CAPSULE | Freq: Every day | ORAL | 0 refills | Status: AC

## 2019-11-15 NOTE — Telephone Encounter (Signed)
Medication filled to new pharmacy. Message routed to PCP for FYI.

## 2019-11-15 NOTE — Telephone Encounter (Signed)
[facility] [place] called and said that patient is living up there now and would like the rx sent to them.  Her number is [phone].

## 2019-12-10 DIAGNOSIS — E782 Mixed hyperlipidemia: Secondary | ICD-10-CM | POA: Diagnosis not present

## 2020-01-02 ENCOUNTER — Other Ambulatory Visit: Payer: Self-pay | Admitting: Family Medicine

## 2020-01-08 DIAGNOSIS — E349 Endocrine disorder, unspecified: Secondary | ICD-10-CM | POA: Diagnosis not present

## 2020-01-08 DIAGNOSIS — R351 Nocturia: Secondary | ICD-10-CM | POA: Diagnosis not present

## 2020-01-08 DIAGNOSIS — N401 Enlarged prostate with lower urinary tract symptoms: Secondary | ICD-10-CM | POA: Diagnosis not present

## 2020-01-08 DIAGNOSIS — E782 Mixed hyperlipidemia: Secondary | ICD-10-CM | POA: Diagnosis not present

## 2020-01-08 DIAGNOSIS — K029 Dental caries, unspecified: Secondary | ICD-10-CM | POA: Diagnosis not present

## 2020-01-08 DIAGNOSIS — M6284 Sarcopenia: Secondary | ICD-10-CM | POA: Diagnosis not present

## 2020-01-08 DIAGNOSIS — J411 Mucopurulent chronic bronchitis: Secondary | ICD-10-CM | POA: Diagnosis not present

## 2020-01-08 DIAGNOSIS — I10 Essential (primary) hypertension: Secondary | ICD-10-CM | POA: Diagnosis not present

## 2020-01-08 DIAGNOSIS — Z125 Encounter for screening for malignant neoplasm of prostate: Secondary | ICD-10-CM | POA: Diagnosis not present

## 2020-01-29 DIAGNOSIS — I10 Essential (primary) hypertension: Secondary | ICD-10-CM | POA: Diagnosis not present

## 2020-01-29 DIAGNOSIS — Z125 Encounter for screening for malignant neoplasm of prostate: Secondary | ICD-10-CM | POA: Diagnosis not present

## 2020-02-05 DIAGNOSIS — G500 Trigeminal neuralgia: Secondary | ICD-10-CM | POA: Diagnosis not present

## 2020-02-05 DIAGNOSIS — E291 Testicular hypofunction: Secondary | ICD-10-CM | POA: Diagnosis not present

## 2020-02-05 DIAGNOSIS — M818 Other osteoporosis without current pathological fracture: Secondary | ICD-10-CM | POA: Diagnosis not present

## 2020-03-02 ENCOUNTER — Other Ambulatory Visit: Payer: Self-pay | Admitting: Family Medicine

## 2020-03-10 DIAGNOSIS — I10 Essential (primary) hypertension: Secondary | ICD-10-CM | POA: Diagnosis not present

## 2020-03-10 DIAGNOSIS — E782 Mixed hyperlipidemia: Secondary | ICD-10-CM | POA: Diagnosis not present

## 2020-04-02 ENCOUNTER — Ambulatory Visit: Payer: Medicare Other | Admitting: Family Medicine

## 2020-04-02 DIAGNOSIS — I10 Essential (primary) hypertension: Secondary | ICD-10-CM | POA: Diagnosis not present

## 2020-04-02 DIAGNOSIS — E782 Mixed hyperlipidemia: Secondary | ICD-10-CM | POA: Diagnosis not present

## 2020-04-08 DIAGNOSIS — I10 Essential (primary) hypertension: Secondary | ICD-10-CM | POA: Diagnosis not present

## 2020-04-08 DIAGNOSIS — E559 Vitamin D deficiency, unspecified: Secondary | ICD-10-CM | POA: Diagnosis not present

## 2020-04-08 DIAGNOSIS — J411 Mucopurulent chronic bronchitis: Secondary | ICD-10-CM | POA: Diagnosis not present

## 2020-04-08 DIAGNOSIS — M818 Other osteoporosis without current pathological fracture: Secondary | ICD-10-CM | POA: Diagnosis not present

## 2020-04-08 DIAGNOSIS — E782 Mixed hyperlipidemia: Secondary | ICD-10-CM | POA: Diagnosis not present

## 2020-04-08 DIAGNOSIS — M6284 Sarcopenia: Secondary | ICD-10-CM | POA: Diagnosis not present

## 2020-04-08 DIAGNOSIS — E349 Endocrine disorder, unspecified: Secondary | ICD-10-CM | POA: Diagnosis not present

## 2020-04-08 DIAGNOSIS — K029 Dental caries, unspecified: Secondary | ICD-10-CM | POA: Diagnosis not present

## 2020-04-23 ENCOUNTER — Telehealth: Payer: Self-pay

## 2020-04-23 NOTE — Telephone Encounter (Signed)
Patient ready for scheduling. Prolia cost to patient $0.00  PRIMARY MEDICAL BENEFIT DETAILS (PHYSICIAN PURCHASE, OR REFERRAL TO TREATING SITE) COVERAGE AVAILABLE: Yes  COVERAGE DETAILS: Benefits subject to a $233.00 deductible ($0.00 met) and 20% co-insurance for the administration and cost of Prolia. The benefits provided on this Verification of Benefits form are Medical Benefits and are the patient's In-Network benefits for Prolia. If you would like Pharmacy Benefits for Prolia, please call [phone].  AUTHORIZATION REQUIRED: No   SECONDARY MEDICAL BENEFIT DETAILS (PHYSICIAN PURCHASE, OR REFERRAL TO TREATING SITE) COVERAGE AVAILABLE: Yes  COVERAGE DETAILS: This is a Corporate treasurer plan that covers the Medicare Part B coinsurance and deductible. The benefits provided on this Verification of Benefits form are Medical Benefits and are the patient's In-Network benefits for Prolia. If you would like Pharmacy Benefits for Prolia, please call [phone].  AUTHORIZATION REQUIRED: No

## 2020-04-29 DIAGNOSIS — J449 Chronic obstructive pulmonary disease, unspecified: Secondary | ICD-10-CM | POA: Diagnosis not present

## 2020-04-29 DIAGNOSIS — R262 Difficulty in walking, not elsewhere classified: Secondary | ICD-10-CM | POA: Diagnosis not present

## 2020-04-29 DIAGNOSIS — Z681 Body mass index (BMI) 19 or less, adult: Secondary | ICD-10-CM | POA: Diagnosis not present

## 2020-04-29 DIAGNOSIS — R918 Other nonspecific abnormal finding of lung field: Secondary | ICD-10-CM | POA: Diagnosis not present

## 2020-04-29 DIAGNOSIS — E782 Mixed hyperlipidemia: Secondary | ICD-10-CM | POA: Diagnosis not present

## 2020-04-29 DIAGNOSIS — I447 Left bundle-branch block, unspecified: Secondary | ICD-10-CM | POA: Diagnosis present

## 2020-04-29 DIAGNOSIS — R052 Subacute cough: Secondary | ICD-10-CM | POA: Diagnosis not present

## 2020-04-29 DIAGNOSIS — Z79891 Long term (current) use of opiate analgesic: Secondary | ICD-10-CM | POA: Diagnosis not present

## 2020-04-29 DIAGNOSIS — J690 Pneumonitis due to inhalation of food and vomit: Secondary | ICD-10-CM | POA: Diagnosis not present

## 2020-04-29 DIAGNOSIS — R0602 Shortness of breath: Secondary | ICD-10-CM | POA: Diagnosis not present

## 2020-04-29 DIAGNOSIS — R64 Cachexia: Secondary | ICD-10-CM | POA: Diagnosis present

## 2020-04-29 DIAGNOSIS — J90 Pleural effusion, not elsewhere classified: Secondary | ICD-10-CM | POA: Diagnosis not present

## 2020-04-29 DIAGNOSIS — I10 Essential (primary) hypertension: Secondary | ICD-10-CM | POA: Diagnosis not present

## 2020-04-29 DIAGNOSIS — I509 Heart failure, unspecified: Secondary | ICD-10-CM | POA: Diagnosis not present

## 2020-04-29 DIAGNOSIS — M6281 Muscle weakness (generalized): Secondary | ICD-10-CM | POA: Diagnosis not present

## 2020-04-29 DIAGNOSIS — J9811 Atelectasis: Secondary | ICD-10-CM | POA: Diagnosis not present

## 2020-04-29 DIAGNOSIS — I400 Infective myocarditis: Secondary | ICD-10-CM | POA: Diagnosis not present

## 2020-04-29 DIAGNOSIS — J189 Pneumonia, unspecified organism: Secondary | ICD-10-CM | POA: Diagnosis not present

## 2020-04-29 DIAGNOSIS — J8410 Pulmonary fibrosis, unspecified: Secondary | ICD-10-CM | POA: Diagnosis not present

## 2020-04-29 DIAGNOSIS — K219 Gastro-esophageal reflux disease without esophagitis: Secondary | ICD-10-CM | POA: Diagnosis not present

## 2020-04-29 DIAGNOSIS — E785 Hyperlipidemia, unspecified: Secondary | ICD-10-CM | POA: Diagnosis not present

## 2020-04-29 DIAGNOSIS — I429 Cardiomyopathy, unspecified: Secondary | ICD-10-CM | POA: Diagnosis not present

## 2020-04-29 DIAGNOSIS — N400 Enlarged prostate without lower urinary tract symptoms: Secondary | ICD-10-CM | POA: Diagnosis not present

## 2020-04-29 DIAGNOSIS — I110 Hypertensive heart disease with heart failure: Secondary | ICD-10-CM | POA: Diagnosis present

## 2020-04-29 DIAGNOSIS — G7281 Critical illness myopathy: Secondary | ICD-10-CM | POA: Diagnosis not present

## 2020-04-29 DIAGNOSIS — E440 Moderate protein-calorie malnutrition: Secondary | ICD-10-CM | POA: Diagnosis present

## 2020-04-29 DIAGNOSIS — M40209 Unspecified kyphosis, site unspecified: Secondary | ICD-10-CM | POA: Diagnosis present

## 2020-04-29 DIAGNOSIS — R791 Abnormal coagulation profile: Secondary | ICD-10-CM | POA: Diagnosis present

## 2020-04-29 DIAGNOSIS — Z515 Encounter for palliative care: Secondary | ICD-10-CM | POA: Diagnosis not present

## 2020-04-29 DIAGNOSIS — R0689 Other abnormalities of breathing: Secondary | ICD-10-CM | POA: Diagnosis not present

## 2020-04-29 DIAGNOSIS — R1310 Dysphagia, unspecified: Secondary | ICD-10-CM | POA: Diagnosis not present

## 2020-04-29 DIAGNOSIS — B3324 Viral cardiomyopathy: Secondary | ICD-10-CM | POA: Diagnosis not present

## 2020-04-29 DIAGNOSIS — M4000 Postural kyphosis, site unspecified: Secondary | ICD-10-CM | POA: Diagnosis not present

## 2020-04-29 DIAGNOSIS — J1282 Pneumonia due to coronavirus disease 2019: Secondary | ICD-10-CM | POA: Diagnosis not present

## 2020-04-29 DIAGNOSIS — Z9981 Dependence on supplemental oxygen: Secondary | ICD-10-CM | POA: Diagnosis not present

## 2020-04-29 DIAGNOSIS — R2689 Other abnormalities of gait and mobility: Secondary | ICD-10-CM | POA: Diagnosis not present

## 2020-04-29 DIAGNOSIS — I959 Hypotension, unspecified: Secondary | ICD-10-CM | POA: Diagnosis not present

## 2020-04-29 DIAGNOSIS — U071 COVID-19: Secondary | ICD-10-CM | POA: Diagnosis not present

## 2020-04-29 DIAGNOSIS — R41841 Cognitive communication deficit: Secondary | ICD-10-CM | POA: Diagnosis not present

## 2020-04-29 DIAGNOSIS — J9601 Acute respiratory failure with hypoxia: Secondary | ICD-10-CM | POA: Diagnosis not present

## 2020-04-29 DIAGNOSIS — J439 Emphysema, unspecified: Secondary | ICD-10-CM | POA: Diagnosis present

## 2020-04-29 DIAGNOSIS — Z87891 Personal history of nicotine dependence: Secondary | ICD-10-CM | POA: Diagnosis not present

## 2020-04-29 DIAGNOSIS — J440 Chronic obstructive pulmonary disease with acute lower respiratory infection: Secondary | ICD-10-CM | POA: Diagnosis not present

## 2020-04-29 DIAGNOSIS — Z741 Need for assistance with personal care: Secondary | ICD-10-CM | POA: Diagnosis not present

## 2020-04-29 DIAGNOSIS — I21A1 Myocardial infarction type 2: Secondary | ICD-10-CM | POA: Diagnosis not present

## 2020-04-29 DIAGNOSIS — Z7982 Long term (current) use of aspirin: Secondary | ICD-10-CM | POA: Diagnosis not present

## 2020-04-29 DIAGNOSIS — R531 Weakness: Secondary | ICD-10-CM | POA: Diagnosis not present

## 2020-04-29 DIAGNOSIS — I5020 Unspecified systolic (congestive) heart failure: Secondary | ICD-10-CM | POA: Diagnosis not present

## 2020-04-29 DIAGNOSIS — R7989 Other specified abnormal findings of blood chemistry: Secondary | ICD-10-CM | POA: Diagnosis not present

## 2020-05-03 DIAGNOSIS — J189 Pneumonia, unspecified organism: Secondary | ICD-10-CM | POA: Diagnosis not present

## 2020-05-05 NOTE — Telephone Encounter (Signed)
Called pt and left VM to call the office.   Please schedule pt for Prolia inj when call is returned.

## 2020-05-14 DIAGNOSIS — Z7982 Long term (current) use of aspirin: Secondary | ICD-10-CM | POA: Diagnosis not present

## 2020-05-14 DIAGNOSIS — K219 Gastro-esophageal reflux disease without esophagitis: Secondary | ICD-10-CM | POA: Diagnosis not present

## 2020-05-14 DIAGNOSIS — N400 Enlarged prostate without lower urinary tract symptoms: Secondary | ICD-10-CM | POA: Diagnosis not present

## 2020-05-14 DIAGNOSIS — M6250 Muscle wasting and atrophy, not elsewhere classified, unspecified site: Secondary | ICD-10-CM | POA: Diagnosis not present

## 2020-05-14 DIAGNOSIS — Z741 Need for assistance with personal care: Secondary | ICD-10-CM | POA: Diagnosis not present

## 2020-05-14 DIAGNOSIS — Z681 Body mass index (BMI) 19 or less, adult: Secondary | ICD-10-CM | POA: Diagnosis not present

## 2020-05-14 DIAGNOSIS — J8410 Pulmonary fibrosis, unspecified: Secondary | ICD-10-CM | POA: Diagnosis not present

## 2020-05-14 DIAGNOSIS — Z8616 Personal history of COVID-19: Secondary | ICD-10-CM | POA: Diagnosis not present

## 2020-05-14 DIAGNOSIS — U071 COVID-19: Secondary | ICD-10-CM | POA: Diagnosis not present

## 2020-05-14 DIAGNOSIS — R262 Difficulty in walking, not elsewhere classified: Secondary | ICD-10-CM | POA: Diagnosis not present

## 2020-05-14 DIAGNOSIS — J690 Pneumonitis due to inhalation of food and vomit: Secondary | ICD-10-CM | POA: Diagnosis not present

## 2020-05-14 DIAGNOSIS — R778 Other specified abnormalities of plasma proteins: Secondary | ICD-10-CM | POA: Diagnosis not present

## 2020-05-14 DIAGNOSIS — E782 Mixed hyperlipidemia: Secondary | ICD-10-CM | POA: Diagnosis not present

## 2020-05-14 DIAGNOSIS — J449 Chronic obstructive pulmonary disease, unspecified: Secondary | ICD-10-CM | POA: Diagnosis not present

## 2020-05-14 DIAGNOSIS — R2689 Other abnormalities of gait and mobility: Secondary | ICD-10-CM | POA: Diagnosis not present

## 2020-05-14 DIAGNOSIS — M6281 Muscle weakness (generalized): Secondary | ICD-10-CM | POA: Diagnosis not present

## 2020-05-14 DIAGNOSIS — I447 Left bundle-branch block, unspecified: Secondary | ICD-10-CM | POA: Diagnosis not present

## 2020-05-14 DIAGNOSIS — E440 Moderate protein-calorie malnutrition: Secondary | ICD-10-CM | POA: Diagnosis not present

## 2020-05-14 DIAGNOSIS — Z515 Encounter for palliative care: Secondary | ICD-10-CM | POA: Diagnosis not present

## 2020-05-14 DIAGNOSIS — R64 Cachexia: Secondary | ICD-10-CM | POA: Diagnosis not present

## 2020-05-14 DIAGNOSIS — R0602 Shortness of breath: Secondary | ICD-10-CM | POA: Diagnosis not present

## 2020-05-14 DIAGNOSIS — I10 Essential (primary) hypertension: Secondary | ICD-10-CM | POA: Diagnosis not present

## 2020-05-14 DIAGNOSIS — R1310 Dysphagia, unspecified: Secondary | ICD-10-CM | POA: Diagnosis not present

## 2020-05-14 DIAGNOSIS — I400 Infective myocarditis: Secondary | ICD-10-CM | POA: Diagnosis not present

## 2020-05-14 DIAGNOSIS — Z79899 Other long term (current) drug therapy: Secondary | ICD-10-CM | POA: Diagnosis not present

## 2020-05-14 DIAGNOSIS — Z9981 Dependence on supplemental oxygen: Secondary | ICD-10-CM | POA: Diagnosis not present

## 2020-05-14 DIAGNOSIS — I491 Atrial premature depolarization: Secondary | ICD-10-CM | POA: Diagnosis not present

## 2020-05-14 DIAGNOSIS — B3324 Viral cardiomyopathy: Secondary | ICD-10-CM | POA: Diagnosis not present

## 2020-05-14 DIAGNOSIS — J1282 Pneumonia due to coronavirus disease 2019: Secondary | ICD-10-CM | POA: Diagnosis not present

## 2020-05-14 DIAGNOSIS — J9601 Acute respiratory failure with hypoxia: Secondary | ICD-10-CM | POA: Diagnosis not present

## 2020-05-14 DIAGNOSIS — J439 Emphysema, unspecified: Secondary | ICD-10-CM | POA: Diagnosis not present

## 2020-05-14 DIAGNOSIS — I429 Cardiomyopathy, unspecified: Secondary | ICD-10-CM | POA: Diagnosis not present

## 2020-05-14 DIAGNOSIS — I21A1 Myocardial infarction type 2: Secondary | ICD-10-CM | POA: Diagnosis not present

## 2020-05-14 DIAGNOSIS — R052 Subacute cough: Secondary | ICD-10-CM | POA: Diagnosis not present

## 2020-05-14 DIAGNOSIS — R41841 Cognitive communication deficit: Secondary | ICD-10-CM | POA: Diagnosis not present

## 2020-05-14 DIAGNOSIS — I5020 Unspecified systolic (congestive) heart failure: Secondary | ICD-10-CM | POA: Diagnosis not present

## 2020-05-14 DIAGNOSIS — M4000 Postural kyphosis, site unspecified: Secondary | ICD-10-CM | POA: Diagnosis not present

## 2020-05-14 DIAGNOSIS — E785 Hyperlipidemia, unspecified: Secondary | ICD-10-CM | POA: Diagnosis not present

## 2020-05-15 NOTE — Telephone Encounter (Signed)
Called to schedule prolia and patient said he has covid and will call back.

## 2020-05-19 DIAGNOSIS — J449 Chronic obstructive pulmonary disease, unspecified: Secondary | ICD-10-CM | POA: Diagnosis not present

## 2020-05-19 DIAGNOSIS — M6250 Muscle wasting and atrophy, not elsewhere classified, unspecified site: Secondary | ICD-10-CM | POA: Diagnosis not present

## 2020-05-19 DIAGNOSIS — J1282 Pneumonia due to coronavirus disease 2019: Secondary | ICD-10-CM | POA: Diagnosis not present

## 2020-05-29 DIAGNOSIS — I491 Atrial premature depolarization: Secondary | ICD-10-CM | POA: Diagnosis not present

## 2020-05-29 DIAGNOSIS — I447 Left bundle-branch block, unspecified: Secondary | ICD-10-CM | POA: Diagnosis not present

## 2020-05-29 DIAGNOSIS — Z79899 Other long term (current) drug therapy: Secondary | ICD-10-CM | POA: Diagnosis not present

## 2020-05-29 DIAGNOSIS — Z8616 Personal history of COVID-19: Secondary | ICD-10-CM | POA: Diagnosis not present

## 2020-05-29 DIAGNOSIS — R778 Other specified abnormalities of plasma proteins: Secondary | ICD-10-CM | POA: Diagnosis not present

## 2020-05-29 DIAGNOSIS — J439 Emphysema, unspecified: Secondary | ICD-10-CM | POA: Diagnosis not present

## 2020-05-29 DIAGNOSIS — Z9981 Dependence on supplemental oxygen: Secondary | ICD-10-CM | POA: Diagnosis not present

## 2020-05-29 DIAGNOSIS — I429 Cardiomyopathy, unspecified: Secondary | ICD-10-CM | POA: Diagnosis not present

## 2020-05-29 DIAGNOSIS — Z7982 Long term (current) use of aspirin: Secondary | ICD-10-CM | POA: Diagnosis not present

## 2020-06-02 DIAGNOSIS — E782 Mixed hyperlipidemia: Secondary | ICD-10-CM | POA: Diagnosis not present

## 2020-06-02 DIAGNOSIS — I10 Essential (primary) hypertension: Secondary | ICD-10-CM | POA: Diagnosis not present

## 2020-06-17 ENCOUNTER — Other Ambulatory Visit: Payer: Self-pay

## 2020-06-17 DIAGNOSIS — I10 Essential (primary) hypertension: Secondary | ICD-10-CM

## 2020-06-17 MED ORDER — VALSARTAN 40 MG PO TABS: 40.0000 mg | ORAL_TABLET | Freq: Every day | ORAL | 1 refills | Status: AC

## 2020-06-18 DIAGNOSIS — I10 Essential (primary) hypertension: Secondary | ICD-10-CM | POA: Diagnosis not present

## 2020-06-18 DIAGNOSIS — U071 COVID-19: Secondary | ICD-10-CM | POA: Diagnosis not present

## 2020-06-18 DIAGNOSIS — J690 Pneumonitis due to inhalation of food and vomit: Secondary | ICD-10-CM | POA: Diagnosis not present

## 2020-06-18 DIAGNOSIS — J9600 Acute respiratory failure, unspecified whether with hypoxia or hypercapnia: Secondary | ICD-10-CM | POA: Diagnosis not present

## 2020-06-18 DIAGNOSIS — J8410 Pulmonary fibrosis, unspecified: Secondary | ICD-10-CM | POA: Diagnosis not present

## 2020-06-18 DIAGNOSIS — J1282 Pneumonia due to coronavirus disease 2019: Secondary | ICD-10-CM | POA: Diagnosis not present

## 2020-06-18 DIAGNOSIS — Z9181 History of falling: Secondary | ICD-10-CM | POA: Diagnosis not present

## 2020-06-18 DIAGNOSIS — K219 Gastro-esophageal reflux disease without esophagitis: Secondary | ICD-10-CM | POA: Diagnosis not present

## 2020-06-18 DIAGNOSIS — R64 Cachexia: Secondary | ICD-10-CM | POA: Diagnosis not present

## 2020-06-18 DIAGNOSIS — Z7951 Long term (current) use of inhaled steroids: Secondary | ICD-10-CM | POA: Diagnosis not present

## 2020-06-18 DIAGNOSIS — Z9981 Dependence on supplemental oxygen: Secondary | ICD-10-CM | POA: Diagnosis not present

## 2020-06-18 DIAGNOSIS — J440 Chronic obstructive pulmonary disease with acute lower respiratory infection: Secondary | ICD-10-CM | POA: Diagnosis not present

## 2020-06-18 DIAGNOSIS — E785 Hyperlipidemia, unspecified: Secondary | ICD-10-CM | POA: Diagnosis not present

## 2020-06-18 DIAGNOSIS — Z7982 Long term (current) use of aspirin: Secondary | ICD-10-CM | POA: Diagnosis not present

## 2020-06-18 DIAGNOSIS — N400 Enlarged prostate without lower urinary tract symptoms: Secondary | ICD-10-CM | POA: Diagnosis not present

## 2020-06-23 DIAGNOSIS — J440 Chronic obstructive pulmonary disease with acute lower respiratory infection: Secondary | ICD-10-CM | POA: Diagnosis not present

## 2020-06-23 DIAGNOSIS — J1282 Pneumonia due to coronavirus disease 2019: Secondary | ICD-10-CM | POA: Diagnosis not present

## 2020-06-23 DIAGNOSIS — J9600 Acute respiratory failure, unspecified whether with hypoxia or hypercapnia: Secondary | ICD-10-CM | POA: Diagnosis not present

## 2020-06-23 DIAGNOSIS — I10 Essential (primary) hypertension: Secondary | ICD-10-CM | POA: Diagnosis not present

## 2020-06-23 DIAGNOSIS — U071 COVID-19: Secondary | ICD-10-CM | POA: Diagnosis not present

## 2020-06-23 DIAGNOSIS — J690 Pneumonitis due to inhalation of food and vomit: Secondary | ICD-10-CM | POA: Diagnosis not present

## 2020-06-24 DIAGNOSIS — J690 Pneumonitis due to inhalation of food and vomit: Secondary | ICD-10-CM | POA: Diagnosis not present

## 2020-06-24 DIAGNOSIS — I10 Essential (primary) hypertension: Secondary | ICD-10-CM | POA: Diagnosis not present

## 2020-06-24 DIAGNOSIS — J1282 Pneumonia due to coronavirus disease 2019: Secondary | ICD-10-CM | POA: Diagnosis not present

## 2020-06-24 DIAGNOSIS — U071 COVID-19: Secondary | ICD-10-CM | POA: Diagnosis not present

## 2020-06-24 DIAGNOSIS — J9600 Acute respiratory failure, unspecified whether with hypoxia or hypercapnia: Secondary | ICD-10-CM | POA: Diagnosis not present

## 2020-06-24 DIAGNOSIS — J440 Chronic obstructive pulmonary disease with acute lower respiratory infection: Secondary | ICD-10-CM | POA: Diagnosis not present

## 2020-06-25 DIAGNOSIS — I429 Cardiomyopathy, unspecified: Secondary | ICD-10-CM | POA: Diagnosis not present

## 2020-06-25 DIAGNOSIS — I272 Pulmonary hypertension, unspecified: Secondary | ICD-10-CM | POA: Diagnosis not present

## 2020-06-25 DIAGNOSIS — I509 Heart failure, unspecified: Secondary | ICD-10-CM | POA: Diagnosis not present

## 2020-06-25 DIAGNOSIS — J449 Chronic obstructive pulmonary disease, unspecified: Secondary | ICD-10-CM | POA: Diagnosis not present

## 2020-06-25 DIAGNOSIS — Z8616 Personal history of COVID-19: Secondary | ICD-10-CM | POA: Diagnosis not present

## 2020-06-25 DIAGNOSIS — Z9981 Dependence on supplemental oxygen: Secondary | ICD-10-CM | POA: Diagnosis not present

## 2020-06-25 DIAGNOSIS — R0602 Shortness of breath: Secondary | ICD-10-CM | POA: Diagnosis not present

## 2020-06-26 DIAGNOSIS — J440 Chronic obstructive pulmonary disease with acute lower respiratory infection: Secondary | ICD-10-CM | POA: Diagnosis not present

## 2020-06-26 DIAGNOSIS — I10 Essential (primary) hypertension: Secondary | ICD-10-CM | POA: Diagnosis not present

## 2020-06-26 DIAGNOSIS — J9600 Acute respiratory failure, unspecified whether with hypoxia or hypercapnia: Secondary | ICD-10-CM | POA: Diagnosis not present

## 2020-06-26 DIAGNOSIS — J690 Pneumonitis due to inhalation of food and vomit: Secondary | ICD-10-CM | POA: Diagnosis not present

## 2020-06-26 DIAGNOSIS — J1282 Pneumonia due to coronavirus disease 2019: Secondary | ICD-10-CM | POA: Diagnosis not present

## 2020-06-26 DIAGNOSIS — U071 COVID-19: Secondary | ICD-10-CM | POA: Diagnosis not present

## 2020-06-29 DIAGNOSIS — U071 COVID-19: Secondary | ICD-10-CM | POA: Diagnosis not present

## 2020-06-29 DIAGNOSIS — J690 Pneumonitis due to inhalation of food and vomit: Secondary | ICD-10-CM | POA: Diagnosis not present

## 2020-06-29 DIAGNOSIS — J440 Chronic obstructive pulmonary disease with acute lower respiratory infection: Secondary | ICD-10-CM | POA: Diagnosis not present

## 2020-06-29 DIAGNOSIS — I10 Essential (primary) hypertension: Secondary | ICD-10-CM | POA: Diagnosis not present

## 2020-06-29 DIAGNOSIS — J1282 Pneumonia due to coronavirus disease 2019: Secondary | ICD-10-CM | POA: Diagnosis not present

## 2020-06-29 DIAGNOSIS — J9600 Acute respiratory failure, unspecified whether with hypoxia or hypercapnia: Secondary | ICD-10-CM | POA: Diagnosis not present

## 2020-06-30 DIAGNOSIS — J9600 Acute respiratory failure, unspecified whether with hypoxia or hypercapnia: Secondary | ICD-10-CM | POA: Diagnosis not present

## 2020-06-30 DIAGNOSIS — J690 Pneumonitis due to inhalation of food and vomit: Secondary | ICD-10-CM | POA: Diagnosis not present

## 2020-06-30 DIAGNOSIS — J440 Chronic obstructive pulmonary disease with acute lower respiratory infection: Secondary | ICD-10-CM | POA: Diagnosis not present

## 2020-06-30 DIAGNOSIS — E782 Mixed hyperlipidemia: Secondary | ICD-10-CM | POA: Diagnosis not present

## 2020-06-30 DIAGNOSIS — U071 COVID-19: Secondary | ICD-10-CM | POA: Diagnosis not present

## 2020-06-30 DIAGNOSIS — I10 Essential (primary) hypertension: Secondary | ICD-10-CM | POA: Diagnosis not present

## 2020-06-30 DIAGNOSIS — J1282 Pneumonia due to coronavirus disease 2019: Secondary | ICD-10-CM | POA: Diagnosis not present

## 2020-07-03 DIAGNOSIS — J440 Chronic obstructive pulmonary disease with acute lower respiratory infection: Secondary | ICD-10-CM | POA: Diagnosis not present

## 2020-07-03 DIAGNOSIS — I10 Essential (primary) hypertension: Secondary | ICD-10-CM | POA: Diagnosis not present

## 2020-07-03 DIAGNOSIS — J1282 Pneumonia due to coronavirus disease 2019: Secondary | ICD-10-CM | POA: Diagnosis not present

## 2020-07-03 DIAGNOSIS — U071 COVID-19: Secondary | ICD-10-CM | POA: Diagnosis not present

## 2020-07-03 DIAGNOSIS — J690 Pneumonitis due to inhalation of food and vomit: Secondary | ICD-10-CM | POA: Diagnosis not present

## 2020-07-03 DIAGNOSIS — J9600 Acute respiratory failure, unspecified whether with hypoxia or hypercapnia: Secondary | ICD-10-CM | POA: Diagnosis not present

## 2020-07-07 DIAGNOSIS — I10 Essential (primary) hypertension: Secondary | ICD-10-CM | POA: Diagnosis not present

## 2020-07-07 DIAGNOSIS — J9600 Acute respiratory failure, unspecified whether with hypoxia or hypercapnia: Secondary | ICD-10-CM | POA: Diagnosis not present

## 2020-07-07 DIAGNOSIS — J690 Pneumonitis due to inhalation of food and vomit: Secondary | ICD-10-CM | POA: Diagnosis not present

## 2020-07-07 DIAGNOSIS — J1282 Pneumonia due to coronavirus disease 2019: Secondary | ICD-10-CM | POA: Diagnosis not present

## 2020-07-07 DIAGNOSIS — U071 COVID-19: Secondary | ICD-10-CM | POA: Diagnosis not present

## 2020-07-07 DIAGNOSIS — J440 Chronic obstructive pulmonary disease with acute lower respiratory infection: Secondary | ICD-10-CM | POA: Diagnosis not present

## 2020-07-08 DIAGNOSIS — J440 Chronic obstructive pulmonary disease with acute lower respiratory infection: Secondary | ICD-10-CM | POA: Diagnosis not present

## 2020-07-08 DIAGNOSIS — J9600 Acute respiratory failure, unspecified whether with hypoxia or hypercapnia: Secondary | ICD-10-CM | POA: Diagnosis not present

## 2020-07-08 DIAGNOSIS — I10 Essential (primary) hypertension: Secondary | ICD-10-CM | POA: Diagnosis not present

## 2020-07-08 DIAGNOSIS — J1282 Pneumonia due to coronavirus disease 2019: Secondary | ICD-10-CM | POA: Diagnosis not present

## 2020-07-08 DIAGNOSIS — J690 Pneumonitis due to inhalation of food and vomit: Secondary | ICD-10-CM | POA: Diagnosis not present

## 2020-07-08 DIAGNOSIS — U071 COVID-19: Secondary | ICD-10-CM | POA: Diagnosis not present

## 2020-07-09 DIAGNOSIS — J9600 Acute respiratory failure, unspecified whether with hypoxia or hypercapnia: Secondary | ICD-10-CM | POA: Diagnosis not present

## 2020-07-09 DIAGNOSIS — J1282 Pneumonia due to coronavirus disease 2019: Secondary | ICD-10-CM | POA: Diagnosis not present

## 2020-07-09 DIAGNOSIS — J690 Pneumonitis due to inhalation of food and vomit: Secondary | ICD-10-CM | POA: Diagnosis not present

## 2020-07-09 DIAGNOSIS — J440 Chronic obstructive pulmonary disease with acute lower respiratory infection: Secondary | ICD-10-CM | POA: Diagnosis not present

## 2020-07-09 DIAGNOSIS — U071 COVID-19: Secondary | ICD-10-CM | POA: Diagnosis not present

## 2020-07-09 DIAGNOSIS — I10 Essential (primary) hypertension: Secondary | ICD-10-CM | POA: Diagnosis not present

## 2020-07-13 DIAGNOSIS — I10 Essential (primary) hypertension: Secondary | ICD-10-CM | POA: Diagnosis not present

## 2020-07-13 DIAGNOSIS — M6284 Sarcopenia: Secondary | ICD-10-CM | POA: Diagnosis not present

## 2020-07-13 DIAGNOSIS — Z7689 Persons encountering health services in other specified circumstances: Secondary | ICD-10-CM | POA: Diagnosis not present

## 2020-07-13 DIAGNOSIS — B3324 Viral cardiomyopathy: Secondary | ICD-10-CM | POA: Diagnosis not present

## 2020-07-13 DIAGNOSIS — J411 Mucopurulent chronic bronchitis: Secondary | ICD-10-CM | POA: Diagnosis not present

## 2020-07-13 DIAGNOSIS — K219 Gastro-esophageal reflux disease without esophagitis: Secondary | ICD-10-CM | POA: Diagnosis not present

## 2020-07-15 DIAGNOSIS — I10 Essential (primary) hypertension: Secondary | ICD-10-CM | POA: Diagnosis not present

## 2020-07-15 DIAGNOSIS — U071 COVID-19: Secondary | ICD-10-CM | POA: Diagnosis not present

## 2020-07-15 DIAGNOSIS — J1282 Pneumonia due to coronavirus disease 2019: Secondary | ICD-10-CM | POA: Diagnosis not present

## 2020-07-15 DIAGNOSIS — J440 Chronic obstructive pulmonary disease with acute lower respiratory infection: Secondary | ICD-10-CM | POA: Diagnosis not present

## 2020-07-15 DIAGNOSIS — J9600 Acute respiratory failure, unspecified whether with hypoxia or hypercapnia: Secondary | ICD-10-CM | POA: Diagnosis not present

## 2020-07-15 DIAGNOSIS — J690 Pneumonitis due to inhalation of food and vomit: Secondary | ICD-10-CM | POA: Diagnosis not present

## 2020-07-16 DIAGNOSIS — J440 Chronic obstructive pulmonary disease with acute lower respiratory infection: Secondary | ICD-10-CM | POA: Diagnosis not present

## 2020-07-16 DIAGNOSIS — J9600 Acute respiratory failure, unspecified whether with hypoxia or hypercapnia: Secondary | ICD-10-CM | POA: Diagnosis not present

## 2020-07-16 DIAGNOSIS — J1282 Pneumonia due to coronavirus disease 2019: Secondary | ICD-10-CM | POA: Diagnosis not present

## 2020-07-16 DIAGNOSIS — J690 Pneumonitis due to inhalation of food and vomit: Secondary | ICD-10-CM | POA: Diagnosis not present

## 2020-07-16 DIAGNOSIS — I10 Essential (primary) hypertension: Secondary | ICD-10-CM | POA: Diagnosis not present

## 2020-07-16 DIAGNOSIS — U071 COVID-19: Secondary | ICD-10-CM | POA: Diagnosis not present

## 2020-07-17 DIAGNOSIS — J9600 Acute respiratory failure, unspecified whether with hypoxia or hypercapnia: Secondary | ICD-10-CM | POA: Diagnosis not present

## 2020-07-17 DIAGNOSIS — J690 Pneumonitis due to inhalation of food and vomit: Secondary | ICD-10-CM | POA: Diagnosis not present

## 2020-07-17 DIAGNOSIS — U071 COVID-19: Secondary | ICD-10-CM | POA: Diagnosis not present

## 2020-07-17 DIAGNOSIS — J440 Chronic obstructive pulmonary disease with acute lower respiratory infection: Secondary | ICD-10-CM | POA: Diagnosis not present

## 2020-07-17 DIAGNOSIS — I10 Essential (primary) hypertension: Secondary | ICD-10-CM | POA: Diagnosis not present

## 2020-07-17 DIAGNOSIS — J1282 Pneumonia due to coronavirus disease 2019: Secondary | ICD-10-CM | POA: Diagnosis not present

## 2020-07-18 DIAGNOSIS — J1282 Pneumonia due to coronavirus disease 2019: Secondary | ICD-10-CM | POA: Diagnosis not present

## 2020-07-18 DIAGNOSIS — Z9181 History of falling: Secondary | ICD-10-CM | POA: Diagnosis not present

## 2020-07-18 DIAGNOSIS — J690 Pneumonitis due to inhalation of food and vomit: Secondary | ICD-10-CM | POA: Diagnosis not present

## 2020-07-18 DIAGNOSIS — U071 COVID-19: Secondary | ICD-10-CM | POA: Diagnosis not present

## 2020-07-18 DIAGNOSIS — N400 Enlarged prostate without lower urinary tract symptoms: Secondary | ICD-10-CM | POA: Diagnosis not present

## 2020-07-18 DIAGNOSIS — Z7982 Long term (current) use of aspirin: Secondary | ICD-10-CM | POA: Diagnosis not present

## 2020-07-18 DIAGNOSIS — R64 Cachexia: Secondary | ICD-10-CM | POA: Diagnosis not present

## 2020-07-18 DIAGNOSIS — K219 Gastro-esophageal reflux disease without esophagitis: Secondary | ICD-10-CM | POA: Diagnosis not present

## 2020-07-18 DIAGNOSIS — J8410 Pulmonary fibrosis, unspecified: Secondary | ICD-10-CM | POA: Diagnosis not present

## 2020-07-18 DIAGNOSIS — I10 Essential (primary) hypertension: Secondary | ICD-10-CM | POA: Diagnosis not present

## 2020-07-18 DIAGNOSIS — E785 Hyperlipidemia, unspecified: Secondary | ICD-10-CM | POA: Diagnosis not present

## 2020-07-18 DIAGNOSIS — Z9981 Dependence on supplemental oxygen: Secondary | ICD-10-CM | POA: Diagnosis not present

## 2020-07-18 DIAGNOSIS — J440 Chronic obstructive pulmonary disease with acute lower respiratory infection: Secondary | ICD-10-CM | POA: Diagnosis not present

## 2020-07-18 DIAGNOSIS — Z7951 Long term (current) use of inhaled steroids: Secondary | ICD-10-CM | POA: Diagnosis not present

## 2020-07-18 DIAGNOSIS — J9600 Acute respiratory failure, unspecified whether with hypoxia or hypercapnia: Secondary | ICD-10-CM | POA: Diagnosis not present

## 2020-07-22 DIAGNOSIS — I10 Essential (primary) hypertension: Secondary | ICD-10-CM | POA: Diagnosis not present

## 2020-07-22 DIAGNOSIS — J690 Pneumonitis due to inhalation of food and vomit: Secondary | ICD-10-CM | POA: Diagnosis not present

## 2020-07-22 DIAGNOSIS — J1282 Pneumonia due to coronavirus disease 2019: Secondary | ICD-10-CM | POA: Diagnosis not present

## 2020-07-22 DIAGNOSIS — J9600 Acute respiratory failure, unspecified whether with hypoxia or hypercapnia: Secondary | ICD-10-CM | POA: Diagnosis not present

## 2020-07-22 DIAGNOSIS — J440 Chronic obstructive pulmonary disease with acute lower respiratory infection: Secondary | ICD-10-CM | POA: Diagnosis not present

## 2020-07-22 DIAGNOSIS — U071 COVID-19: Secondary | ICD-10-CM | POA: Diagnosis not present

## 2020-07-23 DIAGNOSIS — J9600 Acute respiratory failure, unspecified whether with hypoxia or hypercapnia: Secondary | ICD-10-CM | POA: Diagnosis not present

## 2020-07-23 DIAGNOSIS — I10 Essential (primary) hypertension: Secondary | ICD-10-CM | POA: Diagnosis not present

## 2020-07-23 DIAGNOSIS — J1282 Pneumonia due to coronavirus disease 2019: Secondary | ICD-10-CM | POA: Diagnosis not present

## 2020-07-23 DIAGNOSIS — U071 COVID-19: Secondary | ICD-10-CM | POA: Diagnosis not present

## 2020-07-23 DIAGNOSIS — J440 Chronic obstructive pulmonary disease with acute lower respiratory infection: Secondary | ICD-10-CM | POA: Diagnosis not present

## 2020-07-23 DIAGNOSIS — J690 Pneumonitis due to inhalation of food and vomit: Secondary | ICD-10-CM | POA: Diagnosis not present

## 2020-07-24 DIAGNOSIS — I10 Essential (primary) hypertension: Secondary | ICD-10-CM | POA: Diagnosis not present

## 2020-07-24 DIAGNOSIS — J440 Chronic obstructive pulmonary disease with acute lower respiratory infection: Secondary | ICD-10-CM | POA: Diagnosis not present

## 2020-07-24 DIAGNOSIS — U071 COVID-19: Secondary | ICD-10-CM | POA: Diagnosis not present

## 2020-07-24 DIAGNOSIS — J690 Pneumonitis due to inhalation of food and vomit: Secondary | ICD-10-CM | POA: Diagnosis not present

## 2020-07-24 DIAGNOSIS — J9600 Acute respiratory failure, unspecified whether with hypoxia or hypercapnia: Secondary | ICD-10-CM | POA: Diagnosis not present

## 2020-07-24 DIAGNOSIS — J1282 Pneumonia due to coronavirus disease 2019: Secondary | ICD-10-CM | POA: Diagnosis not present

## 2020-07-30 DIAGNOSIS — I10 Essential (primary) hypertension: Secondary | ICD-10-CM | POA: Diagnosis not present

## 2020-07-30 DIAGNOSIS — J1282 Pneumonia due to coronavirus disease 2019: Secondary | ICD-10-CM | POA: Diagnosis not present

## 2020-07-30 DIAGNOSIS — J440 Chronic obstructive pulmonary disease with acute lower respiratory infection: Secondary | ICD-10-CM | POA: Diagnosis not present

## 2020-07-30 DIAGNOSIS — U071 COVID-19: Secondary | ICD-10-CM | POA: Diagnosis not present

## 2020-07-30 DIAGNOSIS — J690 Pneumonitis due to inhalation of food and vomit: Secondary | ICD-10-CM | POA: Diagnosis not present

## 2020-07-30 DIAGNOSIS — J9600 Acute respiratory failure, unspecified whether with hypoxia or hypercapnia: Secondary | ICD-10-CM | POA: Diagnosis not present

## 2020-08-04 DIAGNOSIS — E440 Moderate protein-calorie malnutrition: Secondary | ICD-10-CM | POA: Diagnosis present

## 2020-08-04 DIAGNOSIS — R531 Weakness: Secondary | ICD-10-CM | POA: Diagnosis not present

## 2020-08-04 DIAGNOSIS — M3500 Sicca syndrome, unspecified: Secondary | ICD-10-CM | POA: Diagnosis present

## 2020-08-04 DIAGNOSIS — I214 Non-ST elevation (NSTEMI) myocardial infarction: Secondary | ICD-10-CM | POA: Diagnosis present

## 2020-08-04 DIAGNOSIS — U099 Post covid-19 condition, unspecified: Secondary | ICD-10-CM | POA: Diagnosis present

## 2020-08-04 DIAGNOSIS — Z7901 Long term (current) use of anticoagulants: Secondary | ICD-10-CM | POA: Diagnosis not present

## 2020-08-04 DIAGNOSIS — I959 Hypotension, unspecified: Secondary | ICD-10-CM | POA: Diagnosis not present

## 2020-08-04 DIAGNOSIS — J439 Emphysema, unspecified: Secondary | ICD-10-CM | POA: Diagnosis not present

## 2020-08-04 DIAGNOSIS — I447 Left bundle-branch block, unspecified: Secondary | ICD-10-CM | POA: Diagnosis present

## 2020-08-04 DIAGNOSIS — R0602 Shortness of breath: Secondary | ICD-10-CM | POA: Diagnosis not present

## 2020-08-04 DIAGNOSIS — N390 Urinary tract infection, site not specified: Secondary | ICD-10-CM | POA: Diagnosis present

## 2020-08-04 DIAGNOSIS — I110 Hypertensive heart disease with heart failure: Secondary | ICD-10-CM | POA: Diagnosis present

## 2020-08-04 DIAGNOSIS — Z9981 Dependence on supplemental oxygen: Secondary | ICD-10-CM | POA: Diagnosis not present

## 2020-08-04 DIAGNOSIS — E871 Hypo-osmolality and hyponatremia: Secondary | ICD-10-CM | POA: Diagnosis not present

## 2020-08-04 DIAGNOSIS — J9611 Chronic respiratory failure with hypoxia: Secondary | ICD-10-CM | POA: Diagnosis present

## 2020-08-04 DIAGNOSIS — Z791 Long term (current) use of non-steroidal anti-inflammatories (NSAID): Secondary | ICD-10-CM | POA: Diagnosis not present

## 2020-08-04 DIAGNOSIS — J8410 Pulmonary fibrosis, unspecified: Secondary | ICD-10-CM | POA: Diagnosis present

## 2020-08-04 DIAGNOSIS — I272 Pulmonary hypertension, unspecified: Secondary | ICD-10-CM | POA: Diagnosis present

## 2020-08-04 DIAGNOSIS — I5020 Unspecified systolic (congestive) heart failure: Secondary | ICD-10-CM | POA: Diagnosis present

## 2020-08-04 DIAGNOSIS — B964 Proteus (mirabilis) (morganii) as the cause of diseases classified elsewhere: Secondary | ICD-10-CM | POA: Diagnosis present

## 2020-08-04 DIAGNOSIS — Z8701 Personal history of pneumonia (recurrent): Secondary | ICD-10-CM | POA: Diagnosis not present

## 2020-08-04 DIAGNOSIS — F17290 Nicotine dependence, other tobacco product, uncomplicated: Secondary | ICD-10-CM | POA: Diagnosis present

## 2020-08-04 DIAGNOSIS — R64 Cachexia: Secondary | ICD-10-CM | POA: Diagnosis present

## 2020-08-04 DIAGNOSIS — Z515 Encounter for palliative care: Secondary | ICD-10-CM | POA: Diagnosis not present

## 2020-08-04 DIAGNOSIS — I509 Heart failure, unspecified: Secondary | ICD-10-CM | POA: Diagnosis not present

## 2020-08-04 DIAGNOSIS — I2510 Atherosclerotic heart disease of native coronary artery without angina pectoris: Secondary | ICD-10-CM | POA: Diagnosis not present

## 2020-08-04 DIAGNOSIS — D649 Anemia, unspecified: Secondary | ICD-10-CM | POA: Diagnosis present

## 2020-08-04 DIAGNOSIS — Z681 Body mass index (BMI) 19 or less, adult: Secondary | ICD-10-CM | POA: Diagnosis not present

## 2020-08-04 DIAGNOSIS — Z7982 Long term (current) use of aspirin: Secondary | ICD-10-CM | POA: Diagnosis not present

## 2020-08-09 NOTE — Telephone Encounter (Signed)
Please follow up with pt regarding scheduling Prolia.   Thanks!

## 2020-08-12 DIAGNOSIS — I5042 Chronic combined systolic (congestive) and diastolic (congestive) heart failure: Secondary | ICD-10-CM | POA: Diagnosis not present

## 2020-08-12 DIAGNOSIS — R609 Edema, unspecified: Secondary | ICD-10-CM | POA: Diagnosis not present

## 2020-08-12 DIAGNOSIS — M6284 Sarcopenia: Secondary | ICD-10-CM | POA: Diagnosis not present

## 2020-08-12 DIAGNOSIS — J8410 Pulmonary fibrosis, unspecified: Secondary | ICD-10-CM | POA: Diagnosis not present

## 2020-08-12 DIAGNOSIS — R0602 Shortness of breath: Secondary | ICD-10-CM | POA: Diagnosis not present

## 2020-08-12 DIAGNOSIS — I255 Ischemic cardiomyopathy: Secondary | ICD-10-CM | POA: Diagnosis not present

## 2020-08-12 DIAGNOSIS — K219 Gastro-esophageal reflux disease without esophagitis: Secondary | ICD-10-CM | POA: Diagnosis not present

## 2020-08-12 DIAGNOSIS — I10 Essential (primary) hypertension: Secondary | ICD-10-CM | POA: Diagnosis not present

## 2020-08-12 DIAGNOSIS — E871 Hypo-osmolality and hyponatremia: Secondary | ICD-10-CM | POA: Diagnosis not present

## 2020-08-12 DIAGNOSIS — J439 Emphysema, unspecified: Secondary | ICD-10-CM | POA: Diagnosis not present

## 2020-08-12 DIAGNOSIS — I214 Non-ST elevation (NSTEMI) myocardial infarction: Secondary | ICD-10-CM | POA: Diagnosis not present

## 2020-08-18 NOTE — Telephone Encounter (Signed)
Called to attempt to schedule prolia appt, pt says he "can't do that right now"

## 2020-08-26 ENCOUNTER — Encounter: Payer: Self-pay | Admitting: *Deleted

## 2020-08-31 DIAGNOSIS — K219 Gastro-esophageal reflux disease without esophagitis: Secondary | ICD-10-CM | POA: Diagnosis not present

## 2020-08-31 DIAGNOSIS — E785 Hyperlipidemia, unspecified: Secondary | ICD-10-CM | POA: Diagnosis not present

## 2020-08-31 DIAGNOSIS — I2510 Atherosclerotic heart disease of native coronary artery without angina pectoris: Secondary | ICD-10-CM | POA: Diagnosis not present

## 2020-08-31 DIAGNOSIS — R339 Retention of urine, unspecified: Secondary | ICD-10-CM | POA: Diagnosis not present

## 2020-08-31 DIAGNOSIS — J9611 Chronic respiratory failure with hypoxia: Secondary | ICD-10-CM | POA: Diagnosis not present

## 2020-08-31 DIAGNOSIS — J8410 Pulmonary fibrosis, unspecified: Secondary | ICD-10-CM | POA: Diagnosis not present

## 2020-08-31 DIAGNOSIS — Z8616 Personal history of COVID-19: Secondary | ICD-10-CM | POA: Diagnosis not present

## 2020-08-31 DIAGNOSIS — I5020 Unspecified systolic (congestive) heart failure: Secondary | ICD-10-CM | POA: Diagnosis not present

## 2020-08-31 DIAGNOSIS — I110 Hypertensive heart disease with heart failure: Secondary | ICD-10-CM | POA: Diagnosis not present

## 2020-09-01 DIAGNOSIS — J8410 Pulmonary fibrosis, unspecified: Secondary | ICD-10-CM | POA: Diagnosis not present

## 2020-09-01 DIAGNOSIS — I110 Hypertensive heart disease with heart failure: Secondary | ICD-10-CM | POA: Diagnosis not present

## 2020-09-01 DIAGNOSIS — J9611 Chronic respiratory failure with hypoxia: Secondary | ICD-10-CM | POA: Diagnosis not present

## 2020-09-01 DIAGNOSIS — I2510 Atherosclerotic heart disease of native coronary artery without angina pectoris: Secondary | ICD-10-CM | POA: Diagnosis not present

## 2020-09-01 DIAGNOSIS — I5020 Unspecified systolic (congestive) heart failure: Secondary | ICD-10-CM | POA: Diagnosis not present

## 2020-09-01 DIAGNOSIS — E785 Hyperlipidemia, unspecified: Secondary | ICD-10-CM | POA: Diagnosis not present

## 2020-09-02 DIAGNOSIS — I110 Hypertensive heart disease with heart failure: Secondary | ICD-10-CM | POA: Diagnosis not present

## 2020-09-02 DIAGNOSIS — J9611 Chronic respiratory failure with hypoxia: Secondary | ICD-10-CM | POA: Diagnosis not present

## 2020-09-02 DIAGNOSIS — I2510 Atherosclerotic heart disease of native coronary artery without angina pectoris: Secondary | ICD-10-CM | POA: Diagnosis not present

## 2020-09-02 DIAGNOSIS — J8410 Pulmonary fibrosis, unspecified: Secondary | ICD-10-CM | POA: Diagnosis not present

## 2020-09-02 DIAGNOSIS — I5020 Unspecified systolic (congestive) heart failure: Secondary | ICD-10-CM | POA: Diagnosis not present

## 2020-09-02 DIAGNOSIS — E785 Hyperlipidemia, unspecified: Secondary | ICD-10-CM | POA: Diagnosis not present

## 2020-09-03 DIAGNOSIS — I5020 Unspecified systolic (congestive) heart failure: Secondary | ICD-10-CM | POA: Diagnosis not present

## 2020-09-03 DIAGNOSIS — E785 Hyperlipidemia, unspecified: Secondary | ICD-10-CM | POA: Diagnosis not present

## 2020-09-03 DIAGNOSIS — J9611 Chronic respiratory failure with hypoxia: Secondary | ICD-10-CM | POA: Diagnosis not present

## 2020-09-03 DIAGNOSIS — J8410 Pulmonary fibrosis, unspecified: Secondary | ICD-10-CM | POA: Diagnosis not present

## 2020-09-03 DIAGNOSIS — I110 Hypertensive heart disease with heart failure: Secondary | ICD-10-CM | POA: Diagnosis not present

## 2020-09-03 DIAGNOSIS — I2510 Atherosclerotic heart disease of native coronary artery without angina pectoris: Secondary | ICD-10-CM | POA: Diagnosis not present

## 2020-09-08 DIAGNOSIS — I5020 Unspecified systolic (congestive) heart failure: Secondary | ICD-10-CM | POA: Diagnosis not present

## 2020-09-08 DIAGNOSIS — J8410 Pulmonary fibrosis, unspecified: Secondary | ICD-10-CM | POA: Diagnosis not present

## 2020-09-08 DIAGNOSIS — I110 Hypertensive heart disease with heart failure: Secondary | ICD-10-CM | POA: Diagnosis not present

## 2020-09-08 DIAGNOSIS — E785 Hyperlipidemia, unspecified: Secondary | ICD-10-CM | POA: Diagnosis not present

## 2020-09-08 DIAGNOSIS — J9611 Chronic respiratory failure with hypoxia: Secondary | ICD-10-CM | POA: Diagnosis not present

## 2020-09-08 DIAGNOSIS — I2510 Atherosclerotic heart disease of native coronary artery without angina pectoris: Secondary | ICD-10-CM | POA: Diagnosis not present

## 2020-09-10 ENCOUNTER — Ambulatory Visit: Payer: Medicare Other | Admitting: Family Medicine

## 2020-09-10 DIAGNOSIS — I2510 Atherosclerotic heart disease of native coronary artery without angina pectoris: Secondary | ICD-10-CM | POA: Diagnosis not present

## 2020-09-10 DIAGNOSIS — J8410 Pulmonary fibrosis, unspecified: Secondary | ICD-10-CM | POA: Diagnosis not present

## 2020-09-10 DIAGNOSIS — E785 Hyperlipidemia, unspecified: Secondary | ICD-10-CM | POA: Diagnosis not present

## 2020-09-10 DIAGNOSIS — J9611 Chronic respiratory failure with hypoxia: Secondary | ICD-10-CM | POA: Diagnosis not present

## 2020-09-10 DIAGNOSIS — I110 Hypertensive heart disease with heart failure: Secondary | ICD-10-CM | POA: Diagnosis not present

## 2020-09-10 DIAGNOSIS — I5020 Unspecified systolic (congestive) heart failure: Secondary | ICD-10-CM | POA: Diagnosis not present

## 2020-09-14 DIAGNOSIS — J8410 Pulmonary fibrosis, unspecified: Secondary | ICD-10-CM | POA: Diagnosis not present

## 2020-09-14 DIAGNOSIS — E785 Hyperlipidemia, unspecified: Secondary | ICD-10-CM | POA: Diagnosis not present

## 2020-09-14 DIAGNOSIS — I5020 Unspecified systolic (congestive) heart failure: Secondary | ICD-10-CM | POA: Diagnosis not present

## 2020-09-14 DIAGNOSIS — I110 Hypertensive heart disease with heart failure: Secondary | ICD-10-CM | POA: Diagnosis not present

## 2020-09-14 DIAGNOSIS — I2510 Atherosclerotic heart disease of native coronary artery without angina pectoris: Secondary | ICD-10-CM | POA: Diagnosis not present

## 2020-09-14 DIAGNOSIS — J9611 Chronic respiratory failure with hypoxia: Secondary | ICD-10-CM | POA: Diagnosis not present

## 2020-09-15 DIAGNOSIS — I5020 Unspecified systolic (congestive) heart failure: Secondary | ICD-10-CM | POA: Diagnosis not present

## 2020-09-15 DIAGNOSIS — E785 Hyperlipidemia, unspecified: Secondary | ICD-10-CM | POA: Diagnosis not present

## 2020-09-15 DIAGNOSIS — J9611 Chronic respiratory failure with hypoxia: Secondary | ICD-10-CM | POA: Diagnosis not present

## 2020-09-15 DIAGNOSIS — I2510 Atherosclerotic heart disease of native coronary artery without angina pectoris: Secondary | ICD-10-CM | POA: Diagnosis not present

## 2020-09-15 DIAGNOSIS — J8410 Pulmonary fibrosis, unspecified: Secondary | ICD-10-CM | POA: Diagnosis not present

## 2020-09-15 DIAGNOSIS — I110 Hypertensive heart disease with heart failure: Secondary | ICD-10-CM | POA: Diagnosis not present

## 2020-09-15 NOTE — Telephone Encounter (Signed)
Pt archived in [email or web address].  Please advise if patient/provider wishes to proceed with Prolia therapy.

## 2020-09-17 DIAGNOSIS — E785 Hyperlipidemia, unspecified: Secondary | ICD-10-CM | POA: Diagnosis not present

## 2020-09-17 DIAGNOSIS — I5020 Unspecified systolic (congestive) heart failure: Secondary | ICD-10-CM | POA: Diagnosis not present

## 2020-09-17 DIAGNOSIS — J8410 Pulmonary fibrosis, unspecified: Secondary | ICD-10-CM | POA: Diagnosis not present

## 2020-09-17 DIAGNOSIS — I110 Hypertensive heart disease with heart failure: Secondary | ICD-10-CM | POA: Diagnosis not present

## 2020-09-17 DIAGNOSIS — I2510 Atherosclerotic heart disease of native coronary artery without angina pectoris: Secondary | ICD-10-CM | POA: Diagnosis not present

## 2020-09-17 DIAGNOSIS — J9611 Chronic respiratory failure with hypoxia: Secondary | ICD-10-CM | POA: Diagnosis not present

## 2020-09-18 ENCOUNTER — Ambulatory Visit: Payer: Medicare Other | Admitting: Family Medicine

## 2020-09-20 ENCOUNTER — Emergency Department (HOSPITAL_COMMUNITY)

## 2020-09-20 ENCOUNTER — Emergency Department (HOSPITAL_COMMUNITY)
Admission: EM | Admit: 2020-09-20 | Discharge: 2020-09-28 | DRG: 871 | Disposition: E | Attending: Internal Medicine | Admitting: Internal Medicine

## 2020-09-20 ENCOUNTER — Encounter (HOSPITAL_COMMUNITY): Payer: Self-pay

## 2020-09-20 ENCOUNTER — Other Ambulatory Visit: Payer: Self-pay

## 2020-09-20 DIAGNOSIS — I4901 Ventricular fibrillation: Secondary | ICD-10-CM | POA: Diagnosis not present

## 2020-09-20 DIAGNOSIS — Z7983 Long term (current) use of bisphosphonates: Secondary | ICD-10-CM | POA: Diagnosis not present

## 2020-09-20 DIAGNOSIS — Z8249 Family history of ischemic heart disease and other diseases of the circulatory system: Secondary | ICD-10-CM | POA: Diagnosis not present

## 2020-09-20 DIAGNOSIS — R4182 Altered mental status, unspecified: Secondary | ICD-10-CM | POA: Diagnosis not present

## 2020-09-20 DIAGNOSIS — Z7982 Long term (current) use of aspirin: Secondary | ICD-10-CM | POA: Diagnosis not present

## 2020-09-20 DIAGNOSIS — J8410 Pulmonary fibrosis, unspecified: Secondary | ICD-10-CM | POA: Diagnosis not present

## 2020-09-20 DIAGNOSIS — Z87891 Personal history of nicotine dependence: Secondary | ICD-10-CM

## 2020-09-20 DIAGNOSIS — J80 Acute respiratory distress syndrome: Secondary | ICD-10-CM | POA: Diagnosis not present

## 2020-09-20 DIAGNOSIS — A419 Sepsis, unspecified organism: Secondary | ICD-10-CM | POA: Diagnosis present

## 2020-09-20 DIAGNOSIS — Z800 Family history of malignant neoplasm of digestive organs: Secondary | ICD-10-CM

## 2020-09-20 DIAGNOSIS — J189 Pneumonia, unspecified organism: Secondary | ICD-10-CM | POA: Diagnosis present

## 2020-09-20 DIAGNOSIS — R000 Tachycardia, unspecified: Secondary | ICD-10-CM | POA: Diagnosis not present

## 2020-09-20 DIAGNOSIS — R0682 Tachypnea, not elsewhere classified: Secondary | ICD-10-CM | POA: Diagnosis not present

## 2020-09-20 DIAGNOSIS — I110 Hypertensive heart disease with heart failure: Secondary | ICD-10-CM | POA: Diagnosis not present

## 2020-09-20 DIAGNOSIS — J9611 Chronic respiratory failure with hypoxia: Secondary | ICD-10-CM | POA: Diagnosis not present

## 2020-09-20 DIAGNOSIS — Z8673 Personal history of transient ischemic attack (TIA), and cerebral infarction without residual deficits: Secondary | ICD-10-CM | POA: Diagnosis not present

## 2020-09-20 DIAGNOSIS — Z66 Do not resuscitate: Secondary | ICD-10-CM | POA: Diagnosis not present

## 2020-09-20 DIAGNOSIS — R0602 Shortness of breath: Secondary | ICD-10-CM | POA: Diagnosis not present

## 2020-09-20 DIAGNOSIS — R531 Weakness: Secondary | ICD-10-CM | POA: Diagnosis not present

## 2020-09-20 DIAGNOSIS — E785 Hyperlipidemia, unspecified: Secondary | ICD-10-CM | POA: Diagnosis not present

## 2020-09-20 DIAGNOSIS — Z20822 Contact with and (suspected) exposure to covid-19: Secondary | ICD-10-CM | POA: Diagnosis present

## 2020-09-20 DIAGNOSIS — Z79899 Other long term (current) drug therapy: Secondary | ICD-10-CM

## 2020-09-20 DIAGNOSIS — Z9981 Dependence on supplemental oxygen: Secondary | ICD-10-CM

## 2020-09-20 DIAGNOSIS — J449 Chronic obstructive pulmonary disease, unspecified: Secondary | ICD-10-CM | POA: Diagnosis not present

## 2020-09-20 DIAGNOSIS — I5020 Unspecified systolic (congestive) heart failure: Secondary | ICD-10-CM | POA: Diagnosis not present

## 2020-09-20 DIAGNOSIS — I2510 Atherosclerotic heart disease of native coronary artery without angina pectoris: Secondary | ICD-10-CM | POA: Diagnosis not present

## 2020-09-20 DIAGNOSIS — R0689 Other abnormalities of breathing: Secondary | ICD-10-CM | POA: Diagnosis not present

## 2020-09-20 LAB — HEPATIC FUNCTION PANEL
ALT: 19 U/L (ref 0–44)
AST: 26 U/L (ref 15–41)
Albumin: 2.6 g/dL — ABNORMAL LOW (ref 3.5–5.0)
Alkaline Phosphatase: 47 U/L (ref 38–126)
Bilirubin, Direct: 0.4 mg/dL — ABNORMAL HIGH (ref 0.0–0.2)
Indirect Bilirubin: 1 mg/dL — ABNORMAL HIGH (ref 0.3–0.9)
Total Bilirubin: 1.4 mg/dL — ABNORMAL HIGH (ref 0.3–1.2)
Total Protein: 5.1 g/dL — ABNORMAL LOW (ref 6.5–8.1)

## 2020-09-20 LAB — CBC WITH DIFFERENTIAL/PLATELET
Abs Immature Granulocytes: 0.12 10*3/uL — ABNORMAL HIGH (ref 0.00–0.07)
Basophils Absolute: 0 10*3/uL (ref 0.0–0.1)
Basophils Relative: 0 %
Eosinophils Absolute: 0.1 10*3/uL (ref 0.0–0.5)
Eosinophils Relative: 1 %
HCT: 41.3 % (ref 39.0–52.0)
Hemoglobin: 13.1 g/dL (ref 13.0–17.0)
Immature Granulocytes: 1 %
Lymphocytes Relative: 6 %
Lymphs Abs: 1.1 10*3/uL (ref 0.7–4.0)
MCH: 29.2 pg (ref 26.0–34.0)
MCHC: 31.7 g/dL (ref 30.0–36.0)
MCV: 92.2 fL (ref 80.0–100.0)
Monocytes Absolute: 0.5 10*3/uL (ref 0.1–1.0)
Monocytes Relative: 3 %
Neutro Abs: 16.5 10*3/uL — ABNORMAL HIGH (ref 1.7–7.7)
Neutrophils Relative %: 89 %
Platelets: 275 10*3/uL (ref 150–400)
RBC: 4.48 MIL/uL (ref 4.22–5.81)
RDW: 15.6 % — ABNORMAL HIGH (ref 11.5–15.5)
WBC: 18.3 10*3/uL — ABNORMAL HIGH (ref 4.0–10.5)
nRBC: 0 % (ref 0.0–0.2)

## 2020-09-20 LAB — LIPASE, BLOOD: Lipase: 63 U/L — ABNORMAL HIGH (ref 11–51)

## 2020-09-20 LAB — RESP PANEL BY RT-PCR (FLU A&B, COVID) ARPGX2
Influenza A by PCR: NEGATIVE
Influenza B by PCR: NEGATIVE
SARS Coronavirus 2 by RT PCR: NEGATIVE

## 2020-09-20 LAB — BASIC METABOLIC PANEL
Anion gap: 8 (ref 5–15)
BUN: 10 mg/dL (ref 8–23)
CO2: 27 mmol/L (ref 22–32)
Calcium: 8.4 mg/dL — ABNORMAL LOW (ref 8.9–10.3)
Chloride: 102 mmol/L (ref 98–111)
Creatinine, Ser: 0.64 mg/dL (ref 0.61–1.24)
GFR, Estimated: >60 mL/min (ref >60)
Glucose, Bld: 106 mg/dL — ABNORMAL HIGH (ref 70–99)
Potassium: 4.1 mmol/L (ref 3.5–5.1)
Sodium: 137 mmol/L (ref 135–145)

## 2020-09-20 LAB — TROPONIN I (HIGH SENSITIVITY): Troponin I (High Sensitivity): 27 ng/L — ABNORMAL HIGH (ref <18)

## 2020-09-20 MED ORDER — ACETAMINOPHEN 325 MG PO TABS
650.0000 mg | ORAL_TABLET | Freq: Once | ORAL | Status: DC
  Filled 2020-09-20: qty 2

## 2020-09-20 MED ORDER — SODIUM CHLORIDE 0.9 % IV SOLN
1.0000 g | Freq: Once | INTRAVENOUS | Status: AC
  Administered 2020-09-20: 1 g via INTRAVENOUS
  Filled 2020-09-20: qty 10

## 2020-09-20 MED ORDER — SODIUM CHLORIDE 0.9 % IV BOLUS: 1000.0000 mL | Freq: Once | INTRAVENOUS | Status: DC

## 2020-09-20 MED ORDER — SODIUM CHLORIDE 0.9 % IV BOLUS
1000.0000 mL | Freq: Once | INTRAVENOUS | Status: AC
  Administered 2020-09-20: 1000 mL via INTRAVENOUS

## 2020-09-20 MED ORDER — SODIUM CHLORIDE 0.9 % IV SOLN
500.0000 mg | Freq: Once | INTRAVENOUS | Status: AC
  Administered 2020-09-20: 500 mg via INTRAVENOUS
  Filled 2020-09-20: qty 500

## 2020-09-20 MED ORDER — SODIUM CHLORIDE 0.9 % IV SOLN: 2.0000 g | INTRAVENOUS | Status: DC

## 2020-09-20 MED ORDER — SODIUM CHLORIDE 0.9 % IV SOLN: 500.0000 mg | INTRAVENOUS | Status: DC

## 2020-09-20 MED ORDER — ENOXAPARIN SODIUM 40 MG/0.4ML IJ SOSY: 40.0000 mg | PREFILLED_SYRINGE | INTRAMUSCULAR | Status: DC

## 2020-09-21 DIAGNOSIS — E782 Mixed hyperlipidemia: Secondary | ICD-10-CM | POA: Diagnosis not present

## 2020-09-21 DIAGNOSIS — I10 Essential (primary) hypertension: Secondary | ICD-10-CM | POA: Diagnosis not present

## 2020-09-22 ENCOUNTER — Telehealth: Payer: Self-pay

## 2020-09-24 NOTE — Telephone Encounter (Signed)
Error

## 2020-09-26 LAB — CULTURE, BLOOD (ROUTINE X 2)
Culture: NO GROWTH
Special Requests: ADEQUATE

## 2020-09-28 NOTE — ED Triage Notes (Signed)
Pt BIB [facility] from home d/t being lethargic & having SOB. His baseline 4LO2 was bumped up to 6L and did improve his breathing & mental status (per EMS).EMS reports pt is a hospice pt with [facility] & was on scene. Has Hx of COPD, CHF & A-Fib. Lung sounds-rhonchi, CBG 129, 102/70, warm to touch- 99.7. A/Ox4 upon arrival, 20G LtFa getting 500 NS bolus. Denies pain or n/v, DNR.

## 2020-09-28 NOTE — ED Notes (Signed)
EDP Curatolo at bedside. Pt remains in V-fib, DNR at bedside.

## 2020-09-28 NOTE — Progress Notes (Signed)
   2020/09/22 1356  Clinical Encounter Type  Visited With Patient and family together  Visit Type Initial;Death  Referral From Nurse  Consult/Referral To Chaplain   Chaplain responded to page. Three family members were at bedside, including Pt's son. Chaplain offered drinks and retrieved an additional chair for Pt's family. Pt's family shared that they already called their pastor and had no additional spiritual needs at this time. Chaplain remains available.  This note was prepared by Chaplain Resident, Dante Gang, MDiv. Chaplain remains available as needed through the on-call pager: [phone].

## 2020-09-28 NOTE — ED Notes (Addendum)
Chaplain at bedside with all family.

## 2020-09-28 NOTE — ED Notes (Signed)
This RN was informed by EDP Curatolo that only one Blood culture was needed at this time.

## 2020-09-28 NOTE — ED Notes (Addendum)
This RN noticed Vfib on monitor, called for primary RN to come to bedside. Unable to palpate pulse, no respirations noted. Primary RN stayed with pt while this RN to notify EDP.

## 2020-09-28 NOTE — Progress Notes (Signed)
Notified bedside nurse of need to draw lactic acid and blood cultures.   Secure chat sent to bedside RN asking to follow up on lactic that says it was drawn at 0958 but still no result.  Also asked about the second blood culture that wasn't drawn. Bedside RN stated that provided was ok with only getting one blood culture.

## 2020-09-28 NOTE — ED Notes (Addendum)
EKG captures of Vfib rhythm, no pulse confirmed on pt, EDP remains at bedside.

## 2020-09-28 NOTE — ED Notes (Signed)
Pt stated he needed to pee and he was able to use a urinal. Urinal given.

## 2020-09-28 NOTE — Progress Notes (Signed)
[facility])      This patient is an active hospice patient with [facility], admitted on 08/31/20 with a terminal diagnosis of systolic heart failure. Per [facility] RN notes pt has been increasingly forgetful with more frequent episodes of incontinence.  [facility] will continue to follow for any discharge planning needs and to coordinate continuation of hospice care.  At time of discharge please use [facility] as [facility] contracts this service for our active hospice pts.  Thank you for the opportunity to participate in this patient's care.     Domenic Moras, BSN, RN [facility] Liaison [phone] (24h on call)

## 2020-09-28 NOTE — Progress Notes (Signed)
[facility] was consulted to admit patient for sepsis secondary to a left-sided pneumonia thought likely to be aspiration given prior history of stroke right-sided stroke with residual left-sided weakness.  Patient had been on hospice and was DNR.  Prior to patient being formally seen patient was reported to have gone into V. fib and arrested.  Dr. Ronnald Nian

## 2020-09-28 NOTE — ED Notes (Signed)
EDP Curatolo called pt's daughter in law and son updated them of his passed.

## 2020-09-28 NOTE — ED Notes (Signed)
Chaplain has been paged.

## 2020-09-28 NOTE — ED Notes (Signed)
Time of death called at 18 by Lennice Sites, MD.

## 2020-09-28 NOTE — ED Provider Notes (Addendum)
[facility] EMERGENCY DEPARTMENT Provider Note   CSN: [id number] Arrival date & time:        History Chief Complaint  Patient presents with   Shortness of Breath    [name] is a 87 y.o. male.  Family member states that she has been sick at home with fever and COVID type symptoms.  She has had 2 rapid negative COVID test.  He appeared to be laboring with ambulation today.  On 4 L of oxygen at baseline.  There was maybe some hypoxia to the mid 80s but improved on 6 L.  Patient himself denies any shortness of breath, chest pain but is lethargic when answering questions.  Denies any cough or fever or chills.  The history is provided by the patient and a caregiver.  Shortness of Breath Severity:  Mild Onset quality:  Gradual Duration:  2 days Timing:  Constant Progression:  Unchanged Chronicity:  New Context: activity   Relieved by:  Nothing Worsened by:  Nothing Ineffective treatments:  Oxygen Associated symptoms: no abdominal pain, no chest pain, no cough, no ear pain, no fever, no rash, no sore throat and no vomiting   Risk factors comment:  COPD     Past Medical History:  Diagnosis Date   Arthritis    hands    BPH (benign prostatic hyperplasia)    COPD (chronic obstructive pulmonary disease) (HCC)    Coronary artery disease    CVA (cerebral infarction) 3 years ago   mild stroke   HLD (hyperlipidemia)    Hypertension    Osteoporosis    Pneumonia    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, left cheek    Patient Active Problem List   Diagnosis Date Noted   Orthostatic hypotension 07/12/2019   Protein-calorie malnutrition ([place]) 09/26/2018   Urinary retention 05/30/2018   Fall    Syncope 05/26/2018   Thyroid nodule 04/12/2018   Screening for malignant neoplasm of prostate  10/08/2014   Facial skin lesion 04/08/2014   HTN (hypertension) 10/15/2013   Osteoporosis 10/15/2013   History of CVA (cerebrovascular accident) 10/15/2013   History of compression fracture of spine 10/15/2013   Hyperlipidemia 10/15/2013   Low testosterone 10/15/2013   Carotid stenosis 10/15/2013    Past Surgical History:  Procedure Laterality Date   BACK SURGERY     LAPAROSCOPIC APPENDECTOMY N/A 11/02/2013   Procedure: APPENDECTOMY LAPAROSCOPIC;  Surgeon: Donnie Mesa, MD;  Location: [facility] OR;  Service: General;  Laterality: N/A;       Family History  Problem Relation Age of Onset   Colon cancer Brother    Heart disease Brother    Cancer Father    Colon cancer Brother    Aneurysm Sister     Social History   Tobacco Use   Smoking status: Former    Packs/day: 2.00    Years: 20.00    Pack years: 40.00    Types: Cigarettes    Quit date: 02/29/1988    Years since quitting: 32.5   Smokeless tobacco: Never  Vaping Use   Vaping Use: Never used  Substance Use Topics   Alcohol use: No    Alcohol/week: 0.0 standard drinks   Drug use: No    Home Medications Prior to Admission medications   Medication Sig  Start Date End Date Taking? Authorizing Provider  acetaminophen (TYLENOL) 500 MG tablet Take 500-1,000 mg by mouth every 8 (eight) hours as needed for mild pain or headache.     [provider]  alendronate (FOSAMAX) 70 MG tablet TAKE 1 TABLET EVERY FRIDAY WITH A FULL GLASS OF WATER ON AN EMPTY STOMACH (OVERDUE FOR AN APPOINTMENT. WILL PROVIDE 30 DAY SUPPLY) 11/08/19   Renato Shin, MD  aspirin EC 81 MG tablet Take 81 mg by mouth daily.    [provider]  atorvastatin (LIPITOR) 20 MG tablet TAKE ONE TABLET BY MOUTH DAILY 09/16/19   [name], MD  calcium-vitamin D (OSCAL WITH D) 500-200 MG-UNIT tablet Take 1 tablet by mouth 2 (two) times daily.    [provider]  Cholecalciferol (VITAMIN D-3) 1000 units CAPS Take 1,000 Units by mouth 2  (two) times a day.     [provider]  denosumab (PROLIA) 60 MG/ML SOSY injection Inject 60 mg into the skin every 6 (six) months.    [provider]  feeding supplement, ENSURE ENLIVE, (ENSURE ENLIVE) LIQD Take 237 mLs by mouth 3 (three) times daily between meals. Patient taking differently: Take 237 mLs by mouth 2 (two) times daily between meals.  05/31/18   Radene Gunning, NP  FLOVENT HFA 110 MCG/ACT inhaler USE 2 INHALATIONS TWICE A DAY 08/12/19   [name], MD  ipratropium (ATROVENT HFA) 17 MCG/ACT inhaler Inhale 2 puffs into the lungs every 6 (six) hours as needed for wheezing. 10/16/17   [name], MD  Multiple Vitamin (MULTIVITAMIN WITH MINERALS) TABS tablet Take 1 tablet by mouth daily. 06/01/18   Black, Lezlie Octave, NP  Polyethyl Glycol-Propyl Glycol (SYSTANE) 0.4-0.3 % SOLN Place 1 drop into both eyes 3 (three) times daily as needed (dry eyes).     [provider]  tamsulosin (FLOMAX) 0.4 MG CAPS capsule Take 1 capsule (0.4 mg total) by mouth daily. 11/15/19   [name], MD  traMADol (ULTRAM) 50 MG tablet Take 50 mg by mouth every 6 (six) hours as needed for moderate pain.     [provider]  valsartan (DIOVAN) 40 MG tablet Take 1 tablet (40 mg total) by mouth daily. 06/17/20   [name], MD    Allergies    Patient has no known allergies.  Review of Systems   Review of Systems  Constitutional:  Negative for chills and fever.  HENT:  Negative for ear pain and sore throat.   Eyes:  Negative for pain and visual disturbance.  Respiratory:  Positive for shortness of breath. Negative for cough.   Cardiovascular:  Negative for chest pain and palpitations.  Gastrointestinal:  Negative for abdominal pain and vomiting.  Genitourinary:  Negative for dysuria and hematuria.  Musculoskeletal:  Negative for arthralgias and back pain.  Skin:  Negative for color change and rash.  Neurological:  Negative for seizures and syncope.   All other systems reviewed and are negative.  Physical Exam Updated Vital Signs BP 107/64   Pulse (!) 120   Temp (!) 100.8 F (38.2 C) (Rectal)   Resp 19   SpO2 92%   Physical Exam Vitals and nursing note reviewed.  Constitutional:      Appearance: He is well-developed.  HENT:     Head: Normocephalic and atraumatic.     Mouth/Throat:     Mouth: Mucous membranes are moist.  Eyes:     Conjunctiva/sclera: Conjunctivae normal.  Pupils: Pupils are equal, round, and reactive to light.  Cardiovascular:     Rate and Rhythm: Regular rhythm. Tachycardia present.     Heart sounds: No murmur heard. Pulmonary:     Effort: No respiratory distress.     Breath sounds: Decreased breath sounds present.     Comments: Coarse breath sounds throughout Chest:     Chest wall: No tenderness.  Abdominal:     Palpations: Abdomen is soft.     Tenderness: There is no abdominal tenderness.  Musculoskeletal:        General: Normal range of motion.     Cervical back: Neck supple.     Right lower leg: No edema.     Left lower leg: No edema.  Skin:    General: Skin is warm and dry.     Capillary Refill: Capillary refill takes less than 2 seconds.  Neurological:     General: No focal deficit present.     Mental Status: He is alert.     Comments: Moves all extremities, follows commands  Psychiatric:        Mood and Affect: Mood normal.    ED Results / Procedures / Treatments   Labs (all labs ordered are listed, but only abnormal results are displayed) Labs Reviewed  CBC WITH DIFFERENTIAL/PLATELET - Abnormal; Notable for the following components:      Result Value   WBC 18.3 (*)    RDW 15.6 (*)    Neutro Abs 16.5 (*)    Abs Immature Granulocytes 0.12 (*)    All other components within normal limits  BASIC METABOLIC PANEL - Abnormal; Notable for the following components:   Glucose, Bld 106 (*)    Calcium 8.4 (*)    All other components within normal limits  HEPATIC FUNCTION PANEL -  Abnormal; Notable for the following components:   Total Protein 5.1 (*)    Albumin 2.6 (*)    Total Bilirubin 1.4 (*)    Bilirubin, Direct 0.4 (*)    Indirect Bilirubin 1.0 (*)    All other components within normal limits  LIPASE, BLOOD - Abnormal; Notable for the following components:   Lipase 63 (*)    All other components within normal limits  TROPONIN I (HIGH SENSITIVITY) - Abnormal; Notable for the following components:   Troponin I (High Sensitivity) 27 (*)    All other components within normal limits  RESP PANEL BY RT-PCR (FLU A&B, COVID) ARPGX2  URINE CULTURE  CULTURE, BLOOD (ROUTINE X 2)  CULTURE, BLOOD (ROUTINE X 2)  URINALYSIS, ROUTINE W REFLEX MICROSCOPIC  BRAIN NATRIURETIC PEPTIDE  LACTIC ACID, PLASMA  LACTIC ACID, PLASMA  PROTIME-INR  APTT  TROPONIN I (HIGH SENSITIVITY)    EKG EKG Interpretation  Date/Time:  10-05-2020 09:34:23 EDT Ventricular Rate:  127 PR Interval:  102 QRS Duration: 129 QT Interval:  314 QTC Calculation: 457 R Axis:   33 Text Interpretation: Sinus tachycardia , artifact Atrial premature complex LAE, consider biatrial enlargement Confirmed by Lennice Sites ([id number]) on 10-05-20 10:56:55 AM  Radiology CT Head Wo Contrast  Result Date: October 05, 2020 CLINICAL DATA:  Altered mental status EXAM: CT HEAD WITHOUT CONTRAST TECHNIQUE: Contiguous axial images were obtained from the base of the skull through the vertex without intravenous contrast. COMPARISON:  Head CT 08/31/2017 FINDINGS: Brain: No evidence of acute infarction, hemorrhage, hydrocephalus, extra-axial collection or mass lesion/mass effect. Small remote right inferior cerebellar infarction. Chronic small vessel ischemia in the hemispheric white matter. Cerebral volume  loss in keeping with age. Vascular: Atheromatous calcification Skull: Negative Sinuses/Orbits: No acute finding.  Bilateral cataract resection. IMPRESSION: 1. No acute finding or change from 2019. 2. Chronic small vessel  ischemia and remote right cerebellar infarct. Electronically Signed   By: Monte Fantasia M.D.   On: 2020-10-12 10:24   DG Chest Portable 1 View  Result Date: 10/12/2020 CLINICAL DATA:  Shortness of breath. EXAM: PORTABLE CHEST 1 VIEW COMPARISON:  05/19/2019 FINDINGS: 0905 hours. Interstitial markings are diffusely coarsened with chronic features. Interval increase in airspace opacity at the left base. The cardiopericardial silhouette is within normal limits for size. Bones are diffusely demineralized. Multilevel vertebral augmentation noted in the thoracolumbar spine. Telemetry leads overlie the chest. IMPRESSION: Chronic interstitial lung disease with interval increase in airspace disease at the left base. Left basilar pneumonia a concern. Electronically Signed   By: Misty Stanley M.D.   On: 12-Oct-2020 09:19    Procedures .Critical Care  Date/Time: 2020/10/12 11:52 AM Performed by: Lennice Sites, DO Authorized by: Lennice Sites, DO   Critical care provider statement:    Critical care time (minutes):  35   Critical care was necessary to treat or prevent imminent or life-threatening deterioration of the following conditions:  Sepsis   Critical care was time spent personally by me on the following activities:  Blood draw for specimens, development of treatment plan with patient or surrogate, discussions with primary provider, evaluation of patient's response to treatment, examination of patient, obtaining history from patient or surrogate, ordering and performing treatments and interventions, ordering and review of radiographic studies, ordering and review of laboratory studies, re-evaluation of patient's condition, pulse oximetry and review of old charts   I assumed direction of critical care for this patient from another provider in my specialty: no     Care discussed with: admitting provider     Medications Ordered in ED Medications  azithromycin (ZITHROMAX) 500 mg in sodium chloride 0.9 %  250 mL IVPB (500 mg Intravenous New Bag/Given (Non-Interop) Oct 12, 2020 1058)  acetaminophen (TYLENOL) tablet 650 mg (650 mg Oral Not Given 10/12/2020 1051)  sodium chloride 0.9 % bolus 1,000 mL (has no administration in time range)  sodium chloride 0.9 % bolus 1,000 mL (1,000 mLs Intravenous New Bag/Given (Non-Interop) 10-12-20 1053)  cefTRIAXone (ROCEPHIN) 1 g in sodium chloride 0.9 % 100 mL IVPB (1 g Intravenous New Bag/Given (Non-Interop) 2020-10-12 1059)    ED Course  I have reviewed the triage vital signs and the nursing notes.  Pertinent labs & imaging results that were available during my care of the patient were reviewed by me and considered in my medical decision making (see chart for details).    MDM Rules/Calculators/A&P                           Audree Camel Mccubbins is here with altered mental status, shortness of breath.  Patient with overall unremarkable vitals except for tachycardia in the 130s.  On 4 L of oxygen chronically.  Talked with caregiver at home who states that she has been sick with fever and cough.  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  She has had 2 rapid negative COVID test.  Patient started to seem a little bit lethargic yesterday.  He is on hospice.  He is DNR however they would want antibiotics and treatments but no major procedures or work-ups.  He seemed more lethargic and  short of breath while walking today.  They needed to increase his home oxygen from 4 L to 6 L.  He is not on any blood thinners.  We will check a rectal temperature and pursue infectious work-up including COVID and flu testing and chest x-ray.  Will give fluid bolus.  Leukocytosis of 18, lactic acid pending.  No significant anemia or electrolyte ab malady otherwise.  Did have an episode of hypertension but I changed blood pressure cuff and blood pressure was 107/64.  Still tachycardic to the 110s.  COVID testing flu test negative.  Chest x-ray with  possible pneumonia.  Will admit for further sepsis work-up.  Patient is DNR however they would want antibiotics.  They would not want any central lines or vasopressors.  At this time due to would like to pursue antibiotics and IV fluids.   After admitted the patient and while he was still in the ED I was notified that he went into an irregular heart rhythm.  He appeared to go into ventricular fibrillation.  Was found to have no pulses.  And given his DNR status no resuscitation was attempted.  Bedside ultrasound confirmed no cardiac activity.  He was pronounced expired at 1252.  Family was notified, hospice team was notified.  Hospice will let primary doctor know of his passing, Dr. Orpah Melter.  We will not be a medical examiner case given his hospice status and known medical problems.    This chart was dictated using voice recognition software.  Despite best efforts to proofread,  errors can occur which can change the documentation meaning.   Final Clinical Impression(s) / ED Diagnoses Final diagnoses:  Sepsis, due to unspecified organism, unspecified whether acute organ dysfunction present [facility])  Community acquired pneumonia, unspecified laterality    Rx / DC Orders ED Discharge Orders     None        Lennice Sites, DO 2020/10/10 [place], [place], DO 2020/10/10 [place], Rhegan Trunnell, DO 10-10-20 1315

## 2020-09-28 NOTE — Progress Notes (Signed)
Code Sepsis monitoring discontinued due to expiration of patient.  

## 2020-09-28 NOTE — Progress Notes (Signed)
Elink following sepsis 

## 2020-09-28 NOTE — ED Notes (Signed)
Pt remains in v-fib, EDP performing bedside fast exam.

## 2020-09-28 DEATH — deceased

## 2020-12-06 IMAGING — RF ESOPHAGUS/BARIUM SWALLOW/TABLET STUDY
9 series · 24 of 24 positions shown · non-contrast
Comparison: Chest CT from 09/19/2018

CLINICAL DATA: Pneumonia.  Dysphagia.

EXAM:
ESOPHOGRAM/BARIUM SWALLOW
TECHNIQUE: Single contrast examination was performed using  thin barium.
FLUOROSCOPY TIME:  Fluoroscopy Time:  1 minutes, 48 seconds
Radiation Exposure Index (if provided by the fluoroscopic device):
16.7 mGy
Number of Acquired Spot Images: 0

[Series 1: cp_standard · 0.34mm/px · 3 of 63 frames shown (1 of 9)]
[frame 10/63]
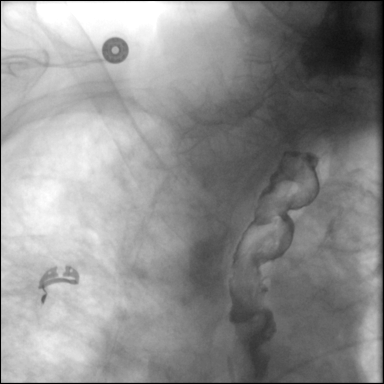
[frame 32/63]
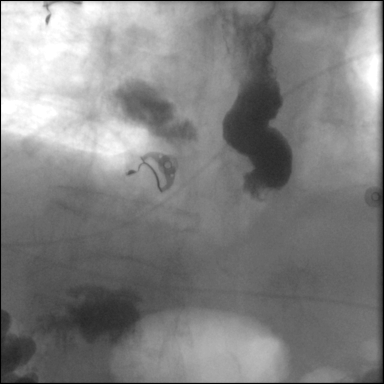
[frame 58/63]
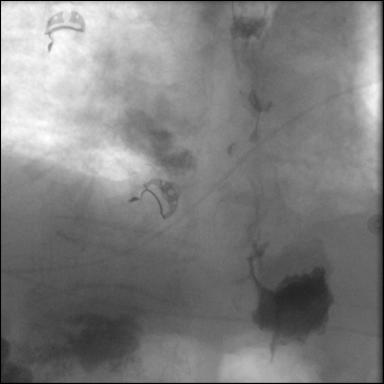

[Series 2: cp_standard · 0.34mm/px · 3 of 41 frames shown (2 of 9)]
[frame 7/41]
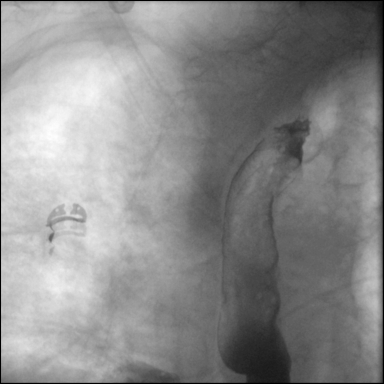
[frame 21/41]
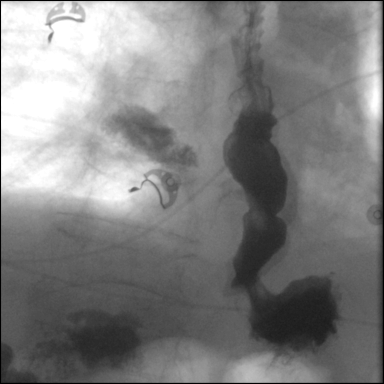
[frame 35/41]
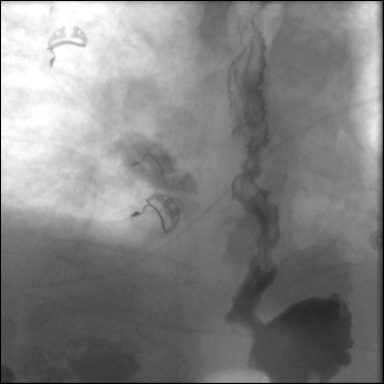

[Series 3: cp_standard · 0.34mm/px · 2 of 41 frames shown (3 of 9)]
[frame 21/41]
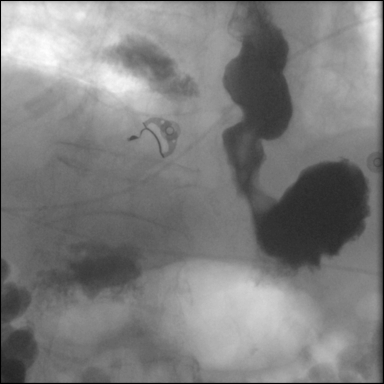
[frame 30/41]
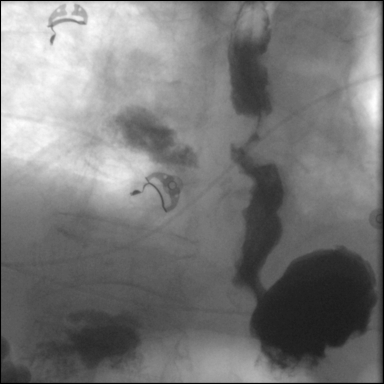

[Series 4: cp_standard · 0.34mm/px · 3 of 25 frames shown (4 of 9)]
[frame 4/25]
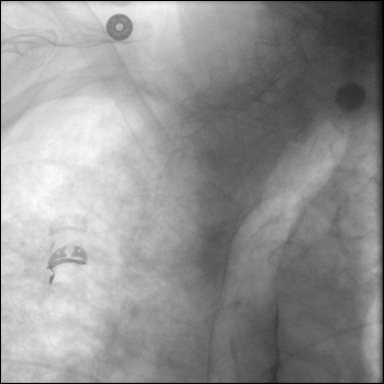
[frame 13/25]
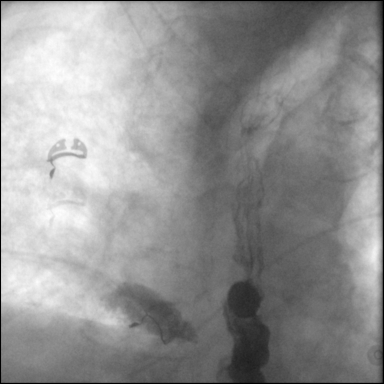
[frame 22/25]
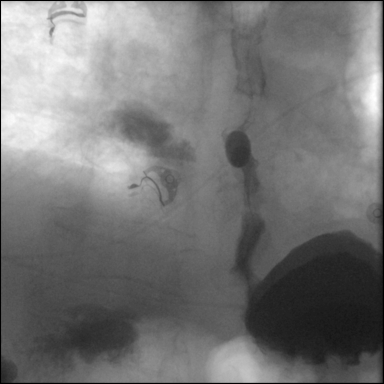

[Series 5: cp_standard · 0.34mm/px · 2 of 5 frames shown (5 of 9)]
[frame 1/5]
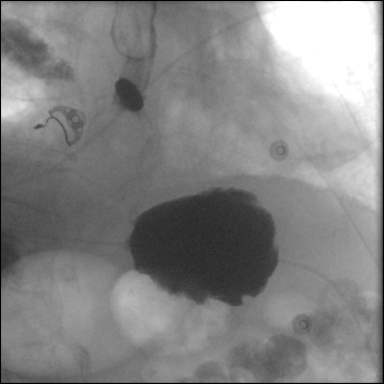
[frame 5/5]
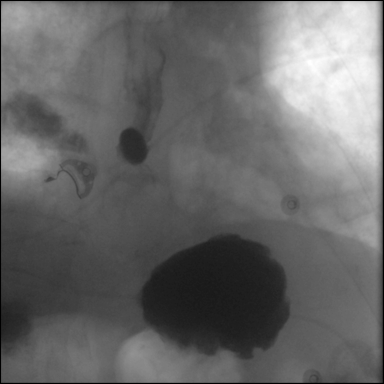

[Series 6: cp_standard · 0.34mm/px · 3 of 39 frames shown (6 of 9)]
[frame 4/39]
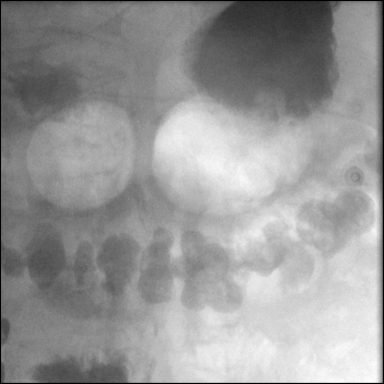
[frame 20/39]
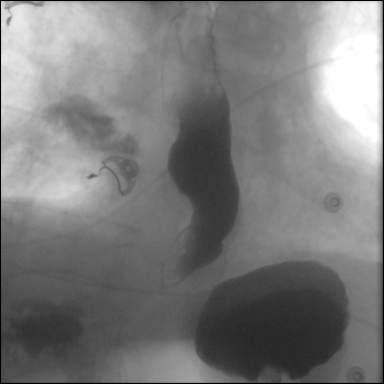
[frame 34/39]
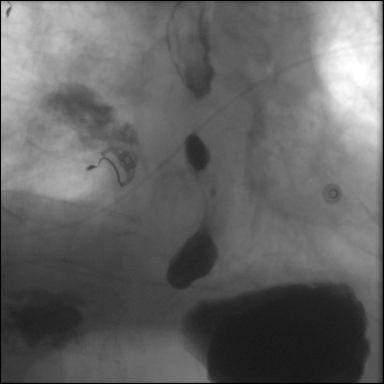

[Series 7: cp_standard · 0.34mm/px · 2 of 39 frames shown (7 of 9)]
[frame 6/39]
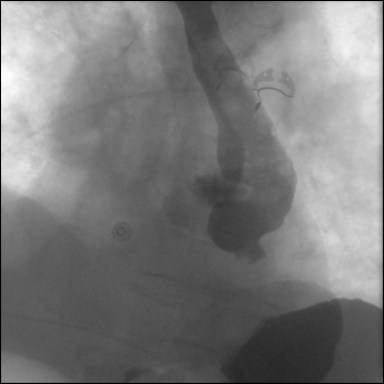
[frame 20/39]
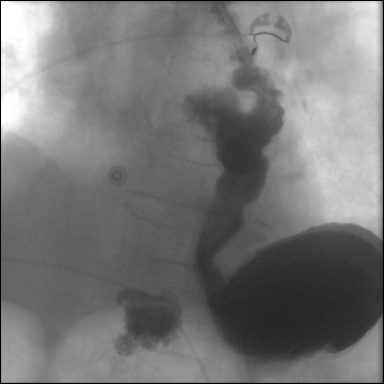

[Series 8: cp_standard · 0.34mm/px · 3 of 16 frames shown (8 of 9)]
[frame 2/16]
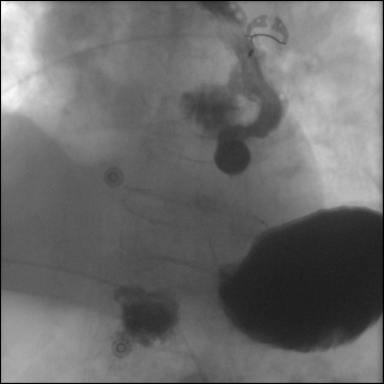
[frame 3/16]
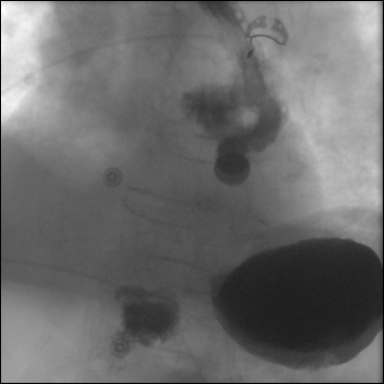
[frame 14/16]
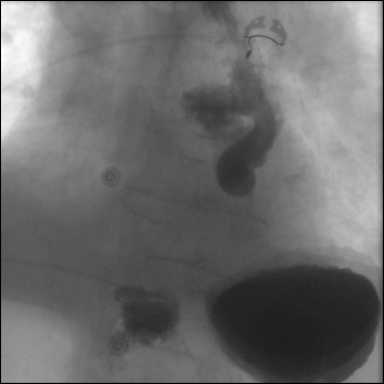

[Series 9: cp_standard · 0.34mm/px · 3 of 39 frames shown (9 of 9)]
[frame 1/39]
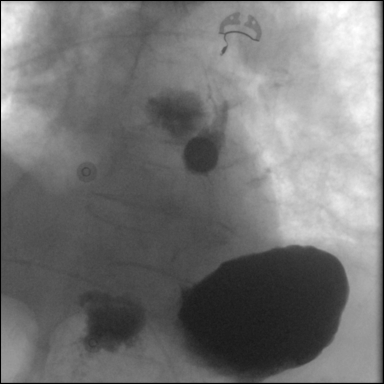
[frame 20/39]
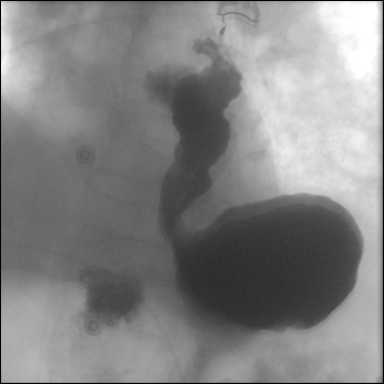
[frame 34/39]
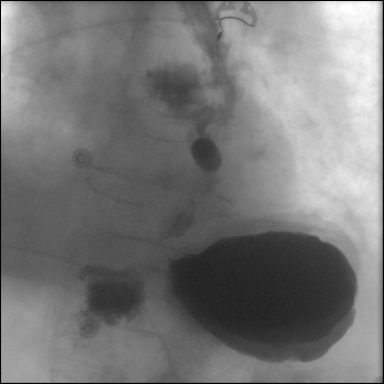

[24 of 24 positions shown; findings below may reference images not displayed]

FINDINGS: Thoracic kyphosis is present. Due to the patient's general frailty,
the exam was performed with patient in the prone positions (mostly
LPO and RPO).

Corkscrew esophagus noted. Secondary contractions effected transit
from the esophagus into the stomach, with proximal escape and loss
of primary peristaltic waves in the mid esophagus. I was only able
to distend the gastroesophageal junction up to about 7 mm, for
example on image [DATE], but there is no definite irregularity in this
vicinity.

The 13 mm barium tail pill passed briskly to the distal esophagus.
The pill did not proceed further despite there not being an obvious
stricture at the location where the pill stopped.
IMPRESSION: 1. Corkscrew esophagus, with esophageal dysmotility and secondary
contractions affecting most of the transit from the esophagus to the
stomach.
2. Maximum distention at the gastroesophageal junction was about 7
mm but was not irregular. It may be that the GE junction failed to
dilate further simply due to dysmotility, but I cannot exclude a
smooth stricture.
3. The 13 mm barium pill stopped in the distal esophagus, several cm
proximal to the GE junction. Although there is no stricture in the
vicinity of the pill, the pill would not proceed further despite
multiple swallows.

## 2020-12-15 ENCOUNTER — Ambulatory Visit: Payer: Medicare Other | Admitting: Family Medicine
